# Patient Record
Sex: Female | Born: 1970 | Race: White | Hispanic: No | Smoking: Former smoker
Health system: Southern US, Community
[De-identification: ages and names within clinical notes are randomized; demographics above are authoritative.]

## PROBLEM LIST (undated history)

## (undated) DIAGNOSIS — O0900 Supervision of pregnancy with history of infertility, unspecified trimester: Secondary | ICD-10-CM

## (undated) DIAGNOSIS — E7800 Pure hypercholesterolemia, unspecified: Secondary | ICD-10-CM

## (undated) DIAGNOSIS — N979 Female infertility, unspecified: Secondary | ICD-10-CM

## (undated) DIAGNOSIS — R112 Nausea with vomiting, unspecified: Secondary | ICD-10-CM

## (undated) DIAGNOSIS — F329 Major depressive disorder, single episode, unspecified: Secondary | ICD-10-CM

## (undated) DIAGNOSIS — R51 Headache: Secondary | ICD-10-CM

## (undated) DIAGNOSIS — K219 Gastro-esophageal reflux disease without esophagitis: Secondary | ICD-10-CM

## (undated) DIAGNOSIS — E282 Polycystic ovarian syndrome: Secondary | ICD-10-CM

## (undated) DIAGNOSIS — N83209 Unspecified ovarian cyst, unspecified side: Secondary | ICD-10-CM

## (undated) DIAGNOSIS — Z9889 Other specified postprocedural states: Secondary | ICD-10-CM

## (undated) DIAGNOSIS — L719 Rosacea, unspecified: Secondary | ICD-10-CM

## (undated) DIAGNOSIS — F32A Depression, unspecified: Secondary | ICD-10-CM

## (undated) HISTORY — DX: Supervision of pregnancy with history of infertility, unspecified trimester: O09.00

## (undated) HISTORY — DX: Gastro-esophageal reflux disease without esophagitis: K21.9

## (undated) HISTORY — DX: Pure hypercholesterolemia, unspecified: E78.00

## (undated) HISTORY — DX: Depression, unspecified: F32.A

## (undated) HISTORY — PX: TUBAL LIGATION: SHX77

## (undated) HISTORY — DX: Rosacea, unspecified: L71.9

## (undated) HISTORY — PX: APPENDECTOMY: SHX54

## (undated) HISTORY — DX: Female infertility, unspecified: N97.9

## (undated) HISTORY — DX: Polycystic ovarian syndrome: E28.2

## (undated) HISTORY — DX: Major depressive disorder, single episode, unspecified: F32.9

## (undated) HISTORY — DX: Headache: R51

---

## 1998-04-04 ENCOUNTER — Other Ambulatory Visit: Admission: RE | Admit: 1998-04-04 | Discharge: 1998-04-04 | Payer: Self-pay | Admitting: Gynecology

## 2001-02-02 ENCOUNTER — Other Ambulatory Visit: Admission: RE | Admit: 2001-02-02 | Discharge: 2001-02-02 | Payer: Self-pay | Admitting: Obstetrics and Gynecology

## 2002-09-14 ENCOUNTER — Other Ambulatory Visit: Admission: RE | Admit: 2002-09-14 | Discharge: 2002-09-14 | Payer: Self-pay | Admitting: Obstetrics and Gynecology

## 2003-08-15 ENCOUNTER — Inpatient Hospital Stay (HOSPITAL_COMMUNITY): Admission: AD | Admit: 2003-08-15 | Discharge: 2003-08-15 | Payer: Self-pay | Admitting: Obstetrics and Gynecology

## 2003-09-12 ENCOUNTER — Inpatient Hospital Stay (HOSPITAL_COMMUNITY): Admission: AD | Admit: 2003-09-12 | Discharge: 2003-09-12 | Payer: Self-pay | Admitting: Obstetrics and Gynecology

## 2003-10-11 ENCOUNTER — Encounter: Admission: RE | Admit: 2003-10-11 | Discharge: 2004-01-09 | Payer: Self-pay | Admitting: Obstetrics and Gynecology

## 2003-11-18 ENCOUNTER — Ambulatory Visit (HOSPITAL_COMMUNITY): Admission: RE | Admit: 2003-11-18 | Discharge: 2003-11-18 | Payer: Self-pay | Admitting: Obstetrics and Gynecology

## 2003-12-20 ENCOUNTER — Ambulatory Visit (HOSPITAL_COMMUNITY): Admission: RE | Admit: 2003-12-20 | Discharge: 2003-12-20 | Payer: Self-pay | Admitting: Obstetrics and Gynecology

## 2004-03-20 ENCOUNTER — Inpatient Hospital Stay (HOSPITAL_COMMUNITY): Admission: AD | Admit: 2004-03-20 | Discharge: 2004-03-20 | Payer: Self-pay | Admitting: Obstetrics and Gynecology

## 2004-03-22 ENCOUNTER — Ambulatory Visit (HOSPITAL_COMMUNITY): Admission: RE | Admit: 2004-03-22 | Discharge: 2004-03-22 | Payer: Self-pay | Admitting: Obstetrics and Gynecology

## 2004-03-29 ENCOUNTER — Inpatient Hospital Stay (HOSPITAL_COMMUNITY): Admission: AD | Admit: 2004-03-29 | Discharge: 2004-03-29 | Payer: Self-pay | Admitting: Obstetrics and Gynecology

## 2004-04-02 ENCOUNTER — Inpatient Hospital Stay (HOSPITAL_COMMUNITY): Admission: AD | Admit: 2004-04-02 | Discharge: 2004-04-05 | Payer: Self-pay | Admitting: Obstetrics and Gynecology

## 2004-04-02 ENCOUNTER — Encounter (INDEPENDENT_AMBULATORY_CARE_PROVIDER_SITE_OTHER): Payer: Self-pay | Admitting: Specialist

## 2004-04-17 ENCOUNTER — Encounter: Admission: RE | Admit: 2004-04-17 | Discharge: 2004-05-17 | Payer: Self-pay | Admitting: Obstetrics and Gynecology

## 2004-05-18 ENCOUNTER — Encounter: Admission: RE | Admit: 2004-05-18 | Discharge: 2004-06-17 | Payer: Self-pay | Admitting: Obstetrics and Gynecology

## 2004-07-18 ENCOUNTER — Encounter: Admission: RE | Admit: 2004-07-18 | Discharge: 2004-08-17 | Payer: Self-pay | Admitting: Obstetrics and Gynecology

## 2004-08-18 ENCOUNTER — Encounter: Admission: RE | Admit: 2004-08-18 | Discharge: 2004-09-17 | Payer: Self-pay | Admitting: Obstetrics and Gynecology

## 2004-10-16 ENCOUNTER — Encounter: Admission: RE | Admit: 2004-10-16 | Discharge: 2004-11-15 | Payer: Self-pay | Admitting: Obstetrics and Gynecology

## 2004-12-16 ENCOUNTER — Encounter: Admission: RE | Admit: 2004-12-16 | Discharge: 2005-01-15 | Payer: Self-pay | Admitting: Obstetrics and Gynecology

## 2005-02-15 ENCOUNTER — Encounter: Admission: RE | Admit: 2005-02-15 | Discharge: 2005-03-17 | Payer: Self-pay | Admitting: Obstetrics and Gynecology

## 2005-03-18 ENCOUNTER — Encounter: Admission: RE | Admit: 2005-03-18 | Discharge: 2005-04-16 | Payer: Self-pay | Admitting: Obstetrics and Gynecology

## 2005-04-17 ENCOUNTER — Encounter: Admission: RE | Admit: 2005-04-17 | Discharge: 2005-05-17 | Payer: Self-pay | Admitting: Obstetrics and Gynecology

## 2005-05-18 ENCOUNTER — Encounter: Admission: RE | Admit: 2005-05-18 | Discharge: 2005-06-16 | Payer: Self-pay | Admitting: Obstetrics and Gynecology

## 2005-06-12 ENCOUNTER — Ambulatory Visit (HOSPITAL_COMMUNITY): Admission: RE | Admit: 2005-06-12 | Discharge: 2005-06-12 | Payer: Self-pay | Admitting: Obstetrics & Gynecology

## 2005-06-12 ENCOUNTER — Ambulatory Visit: Payer: Self-pay | Admitting: *Deleted

## 2005-06-17 ENCOUNTER — Encounter: Admission: RE | Admit: 2005-06-17 | Discharge: 2005-06-28 | Payer: Self-pay | Admitting: Obstetrics and Gynecology

## 2005-06-19 ENCOUNTER — Ambulatory Visit: Payer: Self-pay | Admitting: *Deleted

## 2005-06-26 ENCOUNTER — Ambulatory Visit: Payer: Self-pay | Admitting: Obstetrics & Gynecology

## 2005-07-03 ENCOUNTER — Ambulatory Visit: Payer: Self-pay | Admitting: Obstetrics & Gynecology

## 2005-07-10 ENCOUNTER — Ambulatory Visit: Payer: Self-pay | Admitting: *Deleted

## 2005-07-16 ENCOUNTER — Ambulatory Visit: Payer: Self-pay | Admitting: *Deleted

## 2005-07-17 ENCOUNTER — Ambulatory Visit: Payer: Self-pay | Admitting: Family Medicine

## 2005-07-24 ENCOUNTER — Ambulatory Visit: Payer: Self-pay | Admitting: Family Medicine

## 2005-07-31 ENCOUNTER — Ambulatory Visit: Payer: Self-pay | Admitting: *Deleted

## 2005-08-07 ENCOUNTER — Ambulatory Visit: Payer: Self-pay | Admitting: *Deleted

## 2005-08-21 ENCOUNTER — Ambulatory Visit: Payer: Self-pay | Admitting: *Deleted

## 2005-09-02 ENCOUNTER — Ambulatory Visit: Payer: Self-pay | Admitting: *Deleted

## 2005-09-03 ENCOUNTER — Ambulatory Visit (HOSPITAL_COMMUNITY): Admission: RE | Admit: 2005-09-03 | Discharge: 2005-09-03 | Payer: Self-pay | Admitting: *Deleted

## 2005-09-09 ENCOUNTER — Inpatient Hospital Stay (HOSPITAL_COMMUNITY): Admission: AD | Admit: 2005-09-09 | Discharge: 2005-09-09 | Payer: Self-pay | Admitting: *Deleted

## 2005-09-09 ENCOUNTER — Ambulatory Visit: Payer: Self-pay | Admitting: Family Medicine

## 2005-09-16 ENCOUNTER — Ambulatory Visit: Payer: Self-pay | Admitting: Obstetrics & Gynecology

## 2005-09-30 ENCOUNTER — Ambulatory Visit: Payer: Self-pay | Admitting: Family Medicine

## 2005-10-14 ENCOUNTER — Ambulatory Visit: Payer: Self-pay | Admitting: Obstetrics & Gynecology

## 2005-10-21 ENCOUNTER — Ambulatory Visit: Payer: Self-pay | Admitting: Family Medicine

## 2005-11-04 ENCOUNTER — Ambulatory Visit: Payer: Self-pay | Admitting: Family Medicine

## 2005-11-18 ENCOUNTER — Ambulatory Visit: Payer: Self-pay | Admitting: Family Medicine

## 2005-11-18 ENCOUNTER — Ambulatory Visit (HOSPITAL_COMMUNITY): Admission: RE | Admit: 2005-11-18 | Discharge: 2005-11-18 | Payer: Self-pay | Admitting: *Deleted

## 2005-12-02 ENCOUNTER — Ambulatory Visit: Payer: Self-pay | Admitting: Obstetrics & Gynecology

## 2005-12-02 ENCOUNTER — Inpatient Hospital Stay (HOSPITAL_COMMUNITY): Admission: AD | Admit: 2005-12-02 | Discharge: 2005-12-02 | Payer: Self-pay | Admitting: Gynecology

## 2005-12-10 ENCOUNTER — Inpatient Hospital Stay (HOSPITAL_COMMUNITY): Admission: AD | Admit: 2005-12-10 | Discharge: 2005-12-10 | Payer: Self-pay | Admitting: Obstetrics and Gynecology

## 2005-12-10 ENCOUNTER — Ambulatory Visit: Payer: Self-pay | Admitting: Certified Nurse Midwife

## 2005-12-16 ENCOUNTER — Ambulatory Visit: Payer: Self-pay | Admitting: Obstetrics & Gynecology

## 2005-12-23 ENCOUNTER — Ambulatory Visit: Payer: Self-pay | Admitting: Gynecology

## 2005-12-30 ENCOUNTER — Ambulatory Visit: Payer: Self-pay | Admitting: Obstetrics & Gynecology

## 2005-12-30 ENCOUNTER — Ambulatory Visit (HOSPITAL_COMMUNITY): Admission: RE | Admit: 2005-12-30 | Discharge: 2005-12-30 | Payer: Self-pay | Admitting: Obstetrics and Gynecology

## 2006-01-02 ENCOUNTER — Ambulatory Visit: Payer: Self-pay | Admitting: Family Medicine

## 2006-01-06 ENCOUNTER — Ambulatory Visit: Payer: Self-pay | Admitting: Obstetrics & Gynecology

## 2006-01-09 ENCOUNTER — Ambulatory Visit: Payer: Self-pay | Admitting: Family Medicine

## 2006-01-13 ENCOUNTER — Ambulatory Visit: Payer: Self-pay | Admitting: Gynecology

## 2006-01-15 ENCOUNTER — Inpatient Hospital Stay (HOSPITAL_COMMUNITY): Admission: AD | Admit: 2006-01-15 | Discharge: 2006-01-18 | Payer: Self-pay | Admitting: Gynecology

## 2006-01-15 ENCOUNTER — Encounter (INDEPENDENT_AMBULATORY_CARE_PROVIDER_SITE_OTHER): Payer: Self-pay | Admitting: *Deleted

## 2006-01-15 ENCOUNTER — Ambulatory Visit: Payer: Self-pay | Admitting: Family Medicine

## 2006-01-20 ENCOUNTER — Ambulatory Visit: Payer: Self-pay | Admitting: Gynecology

## 2006-07-18 ENCOUNTER — Emergency Department (HOSPITAL_COMMUNITY): Admission: EM | Admit: 2006-07-18 | Discharge: 2006-07-18 | Payer: Self-pay | Admitting: Emergency Medicine

## 2008-09-29 LAB — HM DIABETES EYE EXAM: HM Diabetic Eye Exam: NORMAL

## 2009-03-08 ENCOUNTER — Ambulatory Visit: Payer: Self-pay | Admitting: Family Medicine

## 2009-03-08 DIAGNOSIS — E119 Type 2 diabetes mellitus without complications: Secondary | ICD-10-CM

## 2009-03-08 DIAGNOSIS — R51 Headache: Secondary | ICD-10-CM

## 2009-03-08 DIAGNOSIS — R519 Headache, unspecified: Secondary | ICD-10-CM | POA: Insufficient documentation

## 2009-03-08 DIAGNOSIS — Z9189 Other specified personal risk factors, not elsewhere classified: Secondary | ICD-10-CM | POA: Insufficient documentation

## 2009-03-08 DIAGNOSIS — K219 Gastro-esophageal reflux disease without esophagitis: Secondary | ICD-10-CM | POA: Insufficient documentation

## 2009-03-08 DIAGNOSIS — E1165 Type 2 diabetes mellitus with hyperglycemia: Secondary | ICD-10-CM | POA: Insufficient documentation

## 2009-03-08 LAB — CONVERTED CEMR LAB
BUN: 10 mg/dL (ref 6–23)
Basophils Absolute: 0 10*3/uL (ref 0.0–0.1)
Basophils Relative: 0.4 % (ref 0.0–3.0)
CO2: 27 meq/L (ref 19–32)
Calcium: 8.8 mg/dL (ref 8.4–10.5)
Chloride: 102 meq/L (ref 96–112)
Creatinine, Ser: 0.6 mg/dL (ref 0.4–1.2)
Creatinine,U: 75.3 mg/dL
Direct LDL: 90.7 mg/dL
Eosinophils Absolute: 0.3 10*3/uL (ref 0.0–0.7)
Eosinophils Relative: 2.8 % (ref 0.0–5.0)
GFR calc non Af Amer: 118.26 mL/min (ref >60)
Glucose, Bld: 343 mg/dL — ABNORMAL HIGH (ref 70–99)
HCT: 40.7 % (ref 36.0–46.0)
Hemoglobin: 13.6 g/dL (ref 12.0–15.0)
Hgb A1c MFr Bld: 8.3 % — ABNORMAL HIGH (ref 4.6–6.5)
LDL Cholesterol: 90 mg/dL
Lymphocytes Relative: 25.1 % (ref 12.0–46.0)
Lymphs Abs: 2.3 10*3/uL (ref 0.7–4.0)
MCHC: 33.4 g/dL (ref 30.0–36.0)
MCV: 86.1 fL (ref 78.0–100.0)
Microalb Creat Ratio: 38.5 mg/g — ABNORMAL HIGH (ref 0.0–30.0)
Microalb, Ur: 2.9 mg/dL — ABNORMAL HIGH (ref 0.0–1.9)
Monocytes Absolute: 0.4 10*3/uL (ref 0.1–1.0)
Monocytes Relative: 4.6 % (ref 3.0–12.0)
Neutro Abs: 6.3 10*3/uL (ref 1.4–7.7)
Neutrophils Relative %: 67.1 % (ref 43.0–77.0)
Platelets: 230 10*3/uL (ref 150.0–400.0)
Potassium: 4 meq/L (ref 3.5–5.1)
RBC: 4.72 M/uL (ref 3.87–5.11)
RDW: 14.2 % (ref 11.5–14.6)
Sodium: 136 meq/L (ref 135–145)
WBC: 9.3 10*3/uL (ref 4.5–10.5)

## 2009-03-09 ENCOUNTER — Telehealth: Payer: Self-pay | Admitting: Family Medicine

## 2009-03-28 ENCOUNTER — Encounter: Admission: RE | Admit: 2009-03-28 | Discharge: 2009-06-26 | Payer: Self-pay | Admitting: Family Medicine

## 2009-04-11 ENCOUNTER — Other Ambulatory Visit: Admission: RE | Admit: 2009-04-11 | Discharge: 2009-04-11 | Payer: Self-pay | Admitting: Family Medicine

## 2009-04-11 ENCOUNTER — Encounter: Payer: Self-pay | Admitting: Family Medicine

## 2009-04-11 ENCOUNTER — Ambulatory Visit: Payer: Self-pay | Admitting: Family Medicine

## 2009-04-11 DIAGNOSIS — E1169 Type 2 diabetes mellitus with other specified complication: Secondary | ICD-10-CM | POA: Insufficient documentation

## 2009-04-11 DIAGNOSIS — E785 Hyperlipidemia, unspecified: Secondary | ICD-10-CM

## 2009-04-11 DIAGNOSIS — L719 Rosacea, unspecified: Secondary | ICD-10-CM | POA: Insufficient documentation

## 2009-04-11 LAB — CONVERTED CEMR LAB
Cholesterol, target level: 200 mg/dL
HDL goal, serum: 50 mg/dL
LDL Goal: 70 mg/dL

## 2009-04-11 LAB — HM DIABETES FOOT EXAM

## 2009-04-13 ENCOUNTER — Encounter (INDEPENDENT_AMBULATORY_CARE_PROVIDER_SITE_OTHER): Payer: Self-pay | Admitting: *Deleted

## 2009-04-18 ENCOUNTER — Encounter: Admission: RE | Admit: 2009-04-18 | Discharge: 2009-04-18 | Payer: Self-pay | Admitting: Family Medicine

## 2009-04-19 ENCOUNTER — Encounter (INDEPENDENT_AMBULATORY_CARE_PROVIDER_SITE_OTHER): Payer: Self-pay | Admitting: *Deleted

## 2009-05-08 ENCOUNTER — Encounter: Payer: Self-pay | Admitting: Family Medicine

## 2009-05-16 ENCOUNTER — Ambulatory Visit: Payer: Self-pay | Admitting: Family Medicine

## 2009-05-31 ENCOUNTER — Ambulatory Visit: Payer: Self-pay | Admitting: Family Medicine

## 2009-07-27 ENCOUNTER — Telehealth: Payer: Self-pay | Admitting: Family Medicine

## 2009-07-27 ENCOUNTER — Ambulatory Visit: Payer: Self-pay | Admitting: Family Medicine

## 2009-07-27 LAB — CONVERTED CEMR LAB: Blood Glucose, Fingerstick: 186

## 2009-09-28 ENCOUNTER — Ambulatory Visit: Payer: Self-pay | Admitting: Family Medicine

## 2009-10-03 ENCOUNTER — Telehealth: Payer: Self-pay | Admitting: Family Medicine

## 2009-10-18 ENCOUNTER — Encounter: Payer: Self-pay | Admitting: Family Medicine

## 2009-11-16 ENCOUNTER — Ambulatory Visit: Payer: Self-pay | Admitting: Family Medicine

## 2009-11-16 LAB — CONVERTED CEMR LAB
BUN: 11 mg/dL (ref 6–23)
CO2: 24 meq/L (ref 19–32)
CRP, High Sensitivity: 7.18 — ABNORMAL HIGH (ref 0.00–5.00)
Calcium: 8.8 mg/dL (ref 8.4–10.5)
Chloride: 103 meq/L (ref 96–112)
Creatinine, Ser: 0.5 mg/dL (ref 0.4–1.2)
Creatinine,U: 156.4 mg/dL
GFR calc non Af Amer: 152.45 mL/min (ref >60)
Glucose, Bld: 206 mg/dL — ABNORMAL HIGH (ref 70–99)
Hgb A1c MFr Bld: 7.3 % — ABNORMAL HIGH (ref 4.6–6.5)
Microalb Creat Ratio: 3.1 mg/g (ref 0.0–30.0)
Microalb, Ur: 4.8 mg/dL — ABNORMAL HIGH (ref 0.0–1.9)
Potassium: 4.1 meq/L (ref 3.5–5.1)
Rheumatoid fact SerPl-aCnc: >20 intl units/mL (ref 0.0–20.0)
Sed Rate: 10 mm/hr (ref 0–22)
Sodium: 136 meq/L (ref 135–145)

## 2009-11-20 ENCOUNTER — Ambulatory Visit: Payer: Self-pay | Admitting: Family Medicine

## 2009-11-20 LAB — CONVERTED CEMR LAB
ANA Titer 1: 1:40 {titer} — ABNORMAL HIGH
Anti Nuclear Antibody(ANA): POSITIVE — AB

## 2009-11-23 LAB — CONVERTED CEMR LAB
ENA SM Ab Ser-aCnc: <1 (ref <30)
Scleroderma (Scl-70) (ENA) Antibody, IgG: 1 (ref <30)
ds DNA Ab: 4 (ref <30)

## 2009-11-24 ENCOUNTER — Ambulatory Visit: Payer: Self-pay | Admitting: Family Medicine

## 2009-11-24 LAB — CONVERTED CEMR LAB: Rapid Strep: POSITIVE

## 2009-11-29 ENCOUNTER — Telehealth: Payer: Self-pay | Admitting: Family Medicine

## 2009-12-06 ENCOUNTER — Encounter: Payer: Self-pay | Admitting: Family Medicine

## 2009-12-12 ENCOUNTER — Ambulatory Visit: Payer: Self-pay | Admitting: Family Medicine

## 2010-01-29 ENCOUNTER — Telehealth: Payer: Self-pay | Admitting: Family Medicine

## 2010-02-27 ENCOUNTER — Encounter: Payer: Self-pay | Admitting: Family Medicine

## 2010-04-06 ENCOUNTER — Encounter: Admission: RE | Admit: 2010-04-06 | Discharge: 2010-04-06 | Payer: Self-pay | Admitting: Rheumatology

## 2010-04-06 ENCOUNTER — Encounter: Payer: Self-pay | Admitting: Family Medicine

## 2010-05-04 ENCOUNTER — Encounter: Admission: RE | Admit: 2010-05-04 | Discharge: 2010-05-04 | Payer: Self-pay | Admitting: Family Medicine

## 2010-06-12 ENCOUNTER — Encounter: Payer: Self-pay | Admitting: Family Medicine

## 2010-06-27 ENCOUNTER — Telehealth (INDEPENDENT_AMBULATORY_CARE_PROVIDER_SITE_OTHER): Payer: Self-pay | Admitting: *Deleted

## 2010-06-29 ENCOUNTER — Ambulatory Visit
Admission: RE | Admit: 2010-06-29 | Discharge: 2010-06-29 | Payer: Self-pay | Source: Home / Self Care | Attending: Family Medicine | Admitting: Family Medicine

## 2010-06-29 LAB — CONVERTED CEMR LAB: Rapid Strep: NEGATIVE

## 2010-07-22 ENCOUNTER — Encounter: Payer: Self-pay | Admitting: *Deleted

## 2010-08-02 NOTE — Assessment & Plan Note (Signed)
Summary: SORE THROAT/lb   Vital Signs:  Patient profile:   40 year old female Height:      69 inches Weight:      198.50 pounds BMI:     29.42 Temp:     98.8 degrees F oral Pulse rate:   72 / minute Pulse rhythm:   regular BP sitting:   90 / 60  (left arm) Cuff size:   large  Vitals Entered By: Benny Lennert CMA Duncan Dull) (June 29, 2010 11:55 AM)  History of Present Illness: Chief complaint sore throat  Acute Visit History:      The patient complains of earache and fever.  She denies chest pain, cough, headache, nasal discharge, and nausea.  Other comments include: right neck sore to touch drainage fro ear  post nasal drip , but no congestion.  2 daughters with strep in last week.  using tylenol for sore throat. [name]        Her highest temperature has been low grade.        The earache is located on the right side.  There is no history of recent antibiotic usage associated with the earache.        Problems Prior to Update: 1)  Eustachian Tube Dysfunction, Right  (ICD-381.81) 2)  Acute Pharyngitis  (ICD-462) 3)  Arthralgia  (ICD-719.40) 4)  Hyperlipidemia  (ICD-272.4) 5)  Acne Rosacea  (ICD-695.3) 6)  Health Maintenance Exam  (ICD-V70.0) 7)  Fatigue  (ICD-780.79) 8)  Diabetes Mellitus, Type II  (ICD-250.00) 9)  Family History Breast Cancer 1st Degree Relative <50  (ICD-V16.3) 10)  Headache, Chronic  (ICD-784.0) 11)  Gerd  (ICD-530.81) 12)  Chickenpox, Hx of  (ICD-V15.9)  Current Medications (verified): 1)  Blood Glucose Test Strips, 250.00 .... Check Blood Sugar Two Times A Day 2)  Blood Sugar Lancets, 250.00 .... Check Blood Sugar Two Times A Day 3)  Accucheck Aviva Test Strips .[name] KitchenMarland Kitchen. 250.00 Check Bs Two Times A Day 4)  Accucheck Aviva Lancets .[name] KitchenMarland Kitchen. 250.00 Check Bs Two Times A Day 5)  Meloxicam 15 Mg Tabs (Meloxicam) .[name].. 1 By Mouth Daily 6)  Voltaren 1 %  Gel (Diclofenac Sodium) .... Qid To Hands (3 Grams) As Needed 7)  Fluticasone Propionate 50 Mcg/act Susp  (Fluticasone Propionate) .... 2 Sprays Per Nostril Daily 8)  Metformin Hcl 1000 Mg Tabs (Metformin Hcl) .... Take One Tablet By Mouth 2 Times Daily 9)  Glipizide 5 Mg Tabs (Glipizide) .... Take One Tablet By Mouth 2 Times Daily  Allergies (verified): No Known Drug Allergies  Past History:  Past medical, surgical, family and social histories (including risk factors) reviewed, and no changes noted (except as noted below).  Past Medical History: Reviewed history from 04/11/2009 and no changes required. UTI'S, HX OF (ICD-V13.00) HEADACHE, CHRONIC (ICD-784.0) GERD (ICD-530.81) CHICKENPOX, HX OF (ICD-V15.9) Diabetes mellitus, type II Hyperlipidemia Rosacea  Past Surgical History: Reviewed history from 03/08/2009 and no changes required. 06-30-1996 C-section of triplets 04-02-04 and 01-15-2006 c-sections singles Appendix 2006 BTL  Family History: Reviewed history from 04/11/2009 and no changes required. Family History of Arthritis Family History Breast cancer 1st degree relative <50 Family History Diabetes 1st degree relative Family History High cholesterol Family History Hypertension Family History of Stroke F 1st degree relative <60 Family History of Stroke M 1st degree relative <50  Mother: Breast CA  Social History: Reviewed history from 03/08/2009 and no changes required. Occupation:Music Minister Married Alcohol use-no Drug use-no Regular exercise-no  Review of Systems General:  Complains  of fatigue; denies fever. CV:  Denies chest pain or discomfort. Resp:  Denies shortness of breath.  Physical Exam  General:  Well-developed,well-nourished,in no acute distress; alert,appropriate and cooperative throughout examination Eyes:  No corneal or conjunctival inflammation noted. EOMI. Perrla. Funduscopic exam benign, without hemorrhages, exudates or papilledema. Vision grossly normal. Ears:  clear fluid right Tm Nose:  nasal discharge, no mucosal pallor.   Mouth:  no  exudates and pharyngeal erythema.   Neck:  no carotid bruit or thyromegaly no cervical or supraclavicular lymphadenopathy  Lungs:  Normal respiratory effort, chest expands symmetrically. Lungs are clear to auscultation, no crackles or wheezes. Heart:  Normal rate and regular rhythm. S1 and S2 normal without gallop, murmur, click, rub or other extra sounds.   Impression & Recommendations:  Problem # 1:  URI (ICD-465.9)  Viral pharyngitis.  Her updated medication list for this problem includes:    Meloxicam 15 Mg Tabs (Meloxicam) .[name]... 1 by mouth daily  Instructed on symptomatic treatment. Call if symptoms persist or worsen.   Complete Medication List: 1)  Blood Glucose Test Strips, 250.00  .... Check blood sugar two times a day 2)  Blood Sugar Lancets, 250.00  .... Check blood sugar two times a day 3)  Accucheck Aviva Test Strips  .[name] KitchenMarland Kitchen. 250.00 check bs two times a day 4)  Accucheck Aviva Lancets  .[name] KitchenMarland Kitchen. 250.00 check bs two times a day 5)  Meloxicam 15 Mg Tabs (Meloxicam) .[name].. 1 by mouth daily 6)  Voltaren 1 % Gel (Diclofenac sodium) .... Qid to hands (3 grams) as needed 7)  Fluticasone Propionate 50 Mcg/act Susp (Fluticasone propionate) .... 2 sprays per nostril daily 8)  Metformin Hcl 1000 Mg Tabs (Metformin hcl) .... Take one tablet by mouth 2 times daily 9)  Glipizide 5 Mg Tabs (Glipizide) .... Take one tablet by mouth 2 times daily  Other Orders: Rapid Strep (62130)   Orders Added: 1)  Rapid Strep [86578] 2)  Est. Patient Level II [46962]    Current Allergies (reviewed today): No known allergies   Laboratory Results    Other Tests  Rapid Strep: negative  Kit Test Internal QC: Negative   (Normal Range: Negative)

## 2010-08-02 NOTE — Progress Notes (Signed)
Summary: can't afford Janumet  Phone Note Call from Patient Call back at Home Phone [phone]   Caller: Patient Call For: [name] MD Summary of Call: Patient states that she can not afford the Janumet. She called asking for coupons, but we didn't have any. She is asking if she could try something different that is less expensive. [facility].  Initial call taken by: Melody Comas,  June 27, 2010 4:19 PM  Follow-up for Phone Call        d/c Janumet  let her know  Call in Metformin 1000 mg by mouth two times a day, #60, 2 refills  Glipizide 5 mg, 1 by mouth two times a day, #60, 2 refills  f/u with me in 1 month  call in and update med list. Follow-up by: [name] MD,  June 28, 2010 3:49 PM  Additional Follow-up for Phone Call Additional follow up Details #1::        Patient advised via message on machine about medication called in and asked to call and schedule a 1 month follow up  Additional Follow-up by: Benny Lennert CMA Duncan Dull),  June 29, 2010 8:17 AM    New/Updated Medications: METFORMIN HCL 1000 MG TABS (METFORMIN HCL) take one tablet by mouth 2 times daily GLIPIZIDE 5 MG TABS (GLIPIZIDE) take one tablet by mouth 2 times daily Prescriptions: GLIPIZIDE 5 MG TABS (GLIPIZIDE) take one tablet by mouth 2 times daily  #60 x 2   Entered by:   Benny Lennert CMA (AAMA)   Authorized by:   [name] MD   Signed by:   Benny Lennert CMA (AAMA) on 06/29/2010   Method used:   [facility]        [facility] [id number]retail[facility]       [address]       [place], [place]  [address]       Ph: [phone] or [phone]       Fax: [phone]   RxID:   [id number] METFORMIN HCL 1000 MG TABS (METFORMIN HCL) take one tablet by mouth 2 times daily  #60 x 2   Entered by:   Benny Lennert CMA (AAMA)   Authorized by:   [name] MD   Signed by:   Benny Lennert CMA (AAMA) on 06/29/2010   Method used:    [facility]        [id number]retail)       [address]       [place], [place]  [address]       Ph: [phone] or [phone]       Fax: [phone]   RxID:   [id number]   Prior Medications: BLOOD GLUCOSE TEST STRIPS, 250.00 () Check blood sugar two times a day BLOOD SUGAR LANCETS, 250.00 () check blood sugar two times a day ACCUCHECK AVIVA TEST STRIPS () 250.00 check bs two times a day ACCUCHECK AVIVA LANCETS () 250.00 Check bs two times a day MELOXICAM 15 MG TABS (MELOXICAM) 1 by mouth daily VOLTAREN 1 %  GEL (DICLOFENAC SODIUM) qid to hands (3 grams) as needed FLUTICASONE PROPIONATE 50 MCG/ACT SUSP (FLUTICASONE PROPIONATE) 2 sprays per nostril daily METFORMIN HCL 1000 MG TABS (METFORMIN HCL) take one tablet by mouth 2 times daily GLIPIZIDE 5 MG TABS (GLIPIZIDE) take one tablet by mouth 2 times daily Current Allergies: No known allergies

## 2010-08-02 NOTE — Letter (Signed)
Summary: Lindsey Mason OD  Lindsey Mason OD   Imported By: Lanelle Bal 12/13/2009 09:47:41  _____________________________________________________________________  External Attachment:    Type:   Image     Comment:   External Document

## 2010-08-02 NOTE — Assessment & Plan Note (Signed)
Summary: ? STREP AND EAR ACHE  CYD   Vital Signs:  Patient profile:   40 year old female Height:      69 inches Weight:      204 pounds BMI:     30.23 Temp:     99.0 degrees F oral Pulse rate:   72 / minute Pulse rhythm:   regular BP sitting:   112 / 80  (right arm) Cuff size:   large  Vitals Entered By: Linde Gillis CMA Duncan Dull) (Nov 24, 2009 12:30 PM) CC: ? strep throat, ear ache   History of Present Illness: 40 yo here ? strep throat.  Daughter diagnosed with strep throat earlier this week.  Last two days, very sore throat. Left ear hurts, has a swollen node behind her left ear, tender to touch. Headache, subjective fever.  No cough, runny nose, fever or chills.  Current Medications (verified): 1)  Blood Glucose Test Strips, 250.00 .... Check Blood Sugar Two Times A Day 2)  Blood Sugar Lancets, 250.00 .... Check Blood Sugar Two Times A Day 3)  Janumet 50-1000 Mg Tabs (Sitagliptin-Metformin Hcl) .[name].. 1 By Mouth Two Times A Day 4)  Accucheck Aviva Test Strips .[name] KitchenMarland Kitchen. 250.00 Check Bs Two Times A Day 5)  Accucheck Aviva Lancets .[name] KitchenMarland Kitchen. 250.00 Check Bs Two Times A Day 6)  Meloxicam 15 Mg Tabs (Meloxicam) .[name].. 1 By Mouth Daily 7)  Voltaren 1 %  Gel (Diclofenac Sodium) .... Qid To Hands (3 Grams) As Needed 8)  Penicillin V Potassium 500 Mg Tabs (Penicillin V Potassium) .[name].. 1 Tab By Mouth Three Times A Day X 10 Days  Allergies (verified): No Known Drug Allergies  Review of Systems      See HPI General:  Complains of chills and fever. ENT:  Complains of sore throat; denies hoarseness, nasal congestion, and postnasal drainage. Resp:  Denies cough, shortness of breath, sputum productive, and wheezing.  Physical Exam  General:  Well-developed,well-nourished,in no acute distress; alert,appropriate and cooperative throughout examination Mouth:  pharyngeal erythema and pharyngeal exudate.   Lungs:  Normal respiratory effort, chest expands symmetrically. Lungs are clear to  auscultation, no crackles or wheezes. Heart:  Normal rate and regular rhythm. S1 and S2 normal without gallop, murmur, click, rub or other extra sounds. Cervical Nodes:  L anterior LN tender and L posterior LN tender.   Psych:  Cognition and judgment appear intact. Alert and cooperative with normal attention span and concentration. No apparent delusions, illusions, hallucinations   Impression & Recommendations:  Problem # 1:  ACUTE PHARYNGITIS (ICD-462) Assessment New Rapid strep positive with cardinal strep symptoms. Treat with PCN 500 mg three times a day x 10 days. Ibuprofen as needed fever and comfort. Her updated medication list for this problem includes:    Meloxicam 15 Mg Tabs (Meloxicam) .[name]... 1 by mouth daily    Penicillin V Potassium 500 Mg Tabs (Penicillin v potassium) .[name]... 1 tab by mouth three times a day x 10 days  Orders: Rapid Strep (47425)  Complete Medication List: 1)  Blood Glucose Test Strips, 250.00  .... Check blood sugar two times a day 2)  Blood Sugar Lancets, 250.00  .... Check blood sugar two times a day 3)  Janumet 50-1000 Mg Tabs (Sitagliptin-metformin hcl) .[name].. 1 by mouth two times a day 4)  Accucheck Aviva Test Strips  .[name] KitchenMarland Kitchen. 250.00 check bs two times a day 5)  Accucheck Aviva Lancets  .[name] KitchenMarland Kitchen. 250.00 check bs two times a day 6)  Meloxicam 15 Mg  Tabs (Meloxicam) .[name].. 1 by mouth daily 7)  Voltaren 1 % Gel (Diclofenac sodium) .... Qid to hands (3 grams) as needed 8)  Penicillin V Potassium 500 Mg Tabs (Penicillin v potassium) .[name].. 1 tab by mouth three times a day x 10 days Prescriptions: PENICILLIN V POTASSIUM 500 MG TABS (PENICILLIN V POTASSIUM) 1 tab by mouth three times a day x 10 days  #30 x 0   Entered and Authorized by:   Ruthe Mannan MD   Signed by:   Ruthe Mannan MD on 11/24/2009   Method used:   [facility]        [facility] [id number]       [address]       [place], [place]  [address]       Ph: [phone] or [phone]        Fax: [phone]   RxID:   [id number]   Current Allergies (reviewed today): No known allergies   Laboratory Results    Other Tests  Rapid Strep: positive  Kit Test Internal QC: Positive   (Normal Range: Negative)

## 2010-08-02 NOTE — Progress Notes (Signed)
Summary: pt has yeast infection  Phone Note Call from Patient Call back at Home Phone [phone]   Caller: Patient Call For: [name] MD Summary of Call: Pt has taken 5 days of pcn and still has 5 to go for ear infections and strep.  She has developed a yeast infection- itching and burning- and is taking monostat.  She is asking if she needs to continue the antibiotc, will the yeast get worse if she does, should she finish the abx and then get something else for yeast?  Please advise. Initial call taken by: Lowella Petties CMA,  November 29, 2009 12:43 PM  Follow-up for Phone Call        Call in   Diflucan 150 mg, 1 by mouth x 1, may repeat if needed in 1 week. #2  Cont ABX for strep -- or it will come back Follow-up by: [name] MD,  November 29, 2009 1:46 PM  Additional Follow-up for Phone Call Additional follow up Details #1::        Patient also has a very bad fever blister on her lip and want to know what she can do for that Additional Follow-up by: Benny Lennert CMA Duncan Dull),  November 29, 2009 2:50 PM    Additional Follow-up for Phone Call Additional follow up Details #2::    Cold sores - do not have to do anything. Benign viral infection.  If it really hurts and wants to be maximally aggressive, could use some oral antivirals.   You do not have do that - it will resolve.  Follow-up by: [name] MD,  November 29, 2009 4:47 PM  Additional Follow-up for Phone Call Additional follow up Details #3:: Details for Additional Follow-up Action Taken: patient advised.Consuello Masse CMA  Additional Follow-up by: Benny Lennert CMA Duncan Dull),  November 30, 2009 7:56 AM  New/Updated Medications: DIFLUCAN 150 MG TABS (FLUCONAZOLE) take on by mouth today and may repeat in 1 week if needed Prescriptions: DIFLUCAN 150 MG TABS (FLUCONAZOLE) take on by mouth today and may repeat in 1 week if needed  #2 x 1   Entered by:   Benny Lennert CMA (AAMA)   Authorized by:   [name] MD   Signed by:   Benny Lennert CMA (AAMA) on 11/29/2009   Method used:   [facility]        [facility] ([facility]ail)       [address]       [place], [place]  [address]       Ph: [phone] or [phone]       Fax: [phone]   RxID:   [id number]

## 2010-08-02 NOTE — Progress Notes (Signed)
Summary: Passed out this morning  Phone Note Call from Patient Call back at Home Phone [phone]   Caller: Patient Call For: [name] MD Summary of Call: Patient called stating that she had stomach cramping, diarrhea,and nausea this morning.  Patient says she passed out after that, she felt like she had to vomit but was unable to.  Recovered from passing out, took a nap, woke up and now her vision is slightly blurred.  She compares the feeling to being outside in the sunlight, then coming inside and your eyes have to adjust to the lighting.  Feels like eyes are just not adjusting.  She says her husband was home with her, did not witness her passing out but patient says she did not hit her head on anything when she passed out.  Patient says she is not having any other symptoms.  Took Ibuprofen which has helped a little.  Please advise. Initial call taken by: Linde Gillis CMA Duncan Dull),  July 27, 2009 2:01 PM  Follow-up for Phone Call        She needs to be seen.   I d/w Dr. Dayton Martes and she can see her at 3:30 Follow-up by: [name] MD,  July 27, 2009 2:12 PM  Additional Follow-up for Phone Call Additional follow up Details #1::        Patient advised, scheduled for 3:30 appt this afternoon with Dr. Dayton Martes. Additional Follow-up by: Linde Gillis CMA Duncan Dull),  July 27, 2009 2:26 PM

## 2010-08-02 NOTE — Assessment & Plan Note (Signed)
Summary: 10:15  EAR/CLE   Vital Signs:  Patient profile:   40 year old female Height:      69 inches Weight:      205.0 pounds BMI:     30.38 Temp:     98.1 degrees F oral Pulse rate:   72 / minute Pulse rhythm:   regular BP sitting:   120 / 80  (left arm) Cuff size:   large  Vitals Entered By: Benny Lennert CMA Duncan Dull) (December 12, 2009 10:19 AM)  History of Present Illness: Chief complaint Ears  Seen on 5/27 for ST...dx with strep pharyngitis...treated with 10 day course of penicillin. Resolved symptoms completely until 3 days ago Since that time noticed clicking sounds in right ear. No ear pain. Swelling and ache in right neck. No fever. No sore throat. No dysphagia or odonyphagia.  no OTC meds.    Problems Prior to Update: 1)  Acute Pharyngitis  (ICD-462) 2)  Arthralgia  (ICD-719.40) 3)  Hyperlipidemia  (ICD-272.4) 4)  Acne Rosacea  (ICD-695.3) 5)  Health Maintenance Exam  (ICD-V70.0) 6)  Fatigue  (ICD-780.79) 7)  Diabetes Mellitus, Type II  (ICD-250.00) 8)  Family History Breast Cancer 1st Degree Relative <50  (ICD-V16.3) 9)  Headache, Chronic  (ICD-784.0) 10)  Gerd  (ICD-530.81) 11)  Chickenpox, Hx of  (ICD-V15.9)  Current Medications (verified): 1)  Blood Glucose Test Strips, 250.00 .... Check Blood Sugar Two Times A Day 2)  Blood Sugar Lancets, 250.00 .... Check Blood Sugar Two Times A Day 3)  Janumet 50-1000 Mg Tabs (Sitagliptin-Metformin Hcl) .[name].. 1 By Mouth Two Times A Day 4)  Accucheck Aviva Test Strips .[name] KitchenMarland Kitchen. 250.00 Check Bs Two Times A Day 5)  Accucheck Aviva Lancets .[name] KitchenMarland Kitchen. 250.00 Check Bs Two Times A Day 6)  Meloxicam 15 Mg Tabs (Meloxicam) .[name].. 1 By Mouth Daily 7)  Voltaren 1 %  Gel (Diclofenac Sodium) .... Qid To Hands (3 Grams) As Needed 8)  Fluticasone Propionate 50 Mcg/act Susp (Fluticasone Propionate) .... 2 Sprays Per Nostril Daily  Allergies (verified): No Known Drug Allergies  Past History:  Past medical, surgical, family and social  histories (including risk factors) reviewed, and no changes noted (except as noted below).  Past Medical History: Reviewed history from 04/11/2009 and no changes required. UTI'S, HX OF (ICD-V13.00) HEADACHE, CHRONIC (ICD-784.0) GERD (ICD-530.81) CHICKENPOX, HX OF (ICD-V15.9) Diabetes mellitus, type II Hyperlipidemia Rosacea  Past Surgical History: Reviewed history from 03/08/2009 and no changes required. 06-30-1996 C-section of triplets 04-02-04 and 01-15-2006 c-sections singles Appendix 2006 BTL  Family History: Reviewed history from 04/11/2009 and no changes required. Family History of Arthritis Family History Breast cancer 1st degree relative <50 Family History Diabetes 1st degree relative Family History High cholesterol Family History Hypertension Family History of Stroke F 1st degree relative <60 Family History of Stroke M 1st degree relative <50  Mother: Breast CA  Social History: Reviewed history from 03/08/2009 and no changes required. Occupation:Music Minister Married Alcohol use-no Drug use-no Regular exercise-no  Review of Systems General:  Denies fatigue, fever, loss of appetite, and weakness. CV:  Denies chest pain or discomfort. Resp:  Denies shortness of breath. GI:  Denies indigestion.  Physical Exam  General:  Overweight femal einNAd Head:  no maxillary sinus ttp. Eyes:  No corneal or conjunctival inflammation noted. EOMI. Perrla. Funduscopic exam benign, without hemorrhages, exudates or papilledema. Vision grossly normal. Ears:  clear fluid B TMs Nose:  nasal discharge, no mucosal pallor.   Mouth:  Oral mucosa and oropharynx  without lesions or exudates.  Teeth in good repair. Neck:  no carotid bruit or thyromegaly no cervical or supraclavicular lymphadenopathy  Lungs:  Normal respiratory effort, chest expands symmetrically. Lungs are clear to auscultation, no crackles or wheezes. Heart:  Normal rate and regular rhythm. S1 and S2 normal without  gallop, murmur, click, rub or other extra sounds.   Impression & Recommendations:  Problem # 1:  ACUTE PHARYNGITIS (ICD-462) resolved with antibiotics.[name] Kitchenno clear sign of complications such as abcesss.   The following medications were removed from the medication list:    Penicillin V Potassium 500 Mg Tabs (Penicillin v potassium) .[name]... 1 tab by mouth three times a day x 10 days Her updated medication list for this problem includes:    Meloxicam 15 Mg Tabs (Meloxicam) .[name]... 1 by mouth daily  Problem # 2:  EUSTACHIAN TUBE DYSFUNCTION, RIGHT (ICD-381.81) Nasal steroid spray 2 sprays per nostril daily. Nasal saline spray or irrigation. Decongestant .. sudafed daily for 3-4 days unless side effects.  Call if symptoms worsening, any fever, difficulty swallowing or not improving in 7-10 days.   Complete Medication List: 1)  Blood Glucose Test Strips, 250.00  .... Check blood sugar two times a day 2)  Blood Sugar Lancets, 250.00  .... Check blood sugar two times a day 3)  Janumet 50-1000 Mg Tabs (Sitagliptin-metformin hcl) .[name].. 1 by mouth two times a day 4)  Accucheck Aviva Test Strips  .[name] KitchenMarland Kitchen. 250.00 check bs two times a day 5)  Accucheck Aviva Lancets  .[name] KitchenMarland Kitchen. 250.00 check bs two times a day 6)  Meloxicam 15 Mg Tabs (Meloxicam) .[name].. 1 by mouth daily 7)  Voltaren 1 % Gel (Diclofenac sodium) .... Qid to hands (3 grams) as needed 8)  Fluticasone Propionate 50 Mcg/act Susp (Fluticasone propionate) .... 2 sprays per nostril daily  Patient Instructions: 1)  Nasla steroid spray 2 sprays per nostril daily. 2)  Nasal saline spray or irrigation. 3)  Decongestant .. sudafed daily for 3-4 days unless side effects.  4)  Call if symptoms worsening, any fever, difficulty swallowing or neot improving in 7-10 days.  Prescriptions: FLUTICASONE PROPIONATE 50 MCG/ACT SUSP (FLUTICASONE PROPIONATE) 2 sprays per nostril daily  #1 x 0   Entered and Authorized by:   Kerby Nora MD   Signed by:   Kerby Nora MD on  12/12/2009   Method used:   [facility]        [facility] (retail)       [address]       [place], [place]  [address]       Ph: [phone] or [phone]       Fax: [phone]   RxID:   [id number]   Current Allergies (reviewed today): No known allergies

## 2010-08-02 NOTE — Letter (Signed)
Summary: Stacey Drain MD  Stacey Drain MD   Imported By: Sherian Rein 03/16/2010 11:48:11  _____________________________________________________________________  External Attachment:    Type:   Image     Comment:   External Document

## 2010-08-02 NOTE — Assessment & Plan Note (Signed)
Summary: pain in fingers and joints/dlo   Vital Signs:  Patient profile:   40 year old female Height:      69 inches Weight:      207.6 pounds BMI:     30.77 Temp:     98.7 degrees F oral Pulse rate:   88 / minute Pulse rhythm:   regular BP sitting:   110 / 70  (left arm) Cuff size:   regular  Vitals Entered By: Benny Lennert CMA Duncan Dull) (Nov 16, 2009 8:18 AM)  History of Present Illness: Chief complaint pain in fingers and joints going on for a while but, worse in last 2 months  No DM follow-up: 125 - 130 avg, doing well. no complications, tol all meds  joint pain:  Pl[employer]ys the [employer] for a living. Back in march did some practicing for about four or five hours.  Now are still hurting all the time.   R fifth and her L thumb. Sometimes swollen.   Swollen some thins mornin. no red or hot joints.  Swelling in the morning.   Noticed that is also really tired Has five kids. When waking p, feels tired and beat up. 10-11 hours of sleep.  Still has soe redness on her face.     Current Problems (verified): 1)  Arthralgia  (ICD-719.40) 2)  Hyperlipidemia  (ICD-272.4) 3)  Acne Rosacea  (ICD-695.3) 4)  Health Maintenance Exam  (ICD-V70.0) 5)  Fatigue  (ICD-780.79) 6)  Diabetes Mellitus, Type II  (ICD-250.00) 7)  Family History Breast Cancer 1st Degree Relative <50  (ICD-V16.3) 8)  Headache, Chronic  (ICD-784.0) 9)  Gerd  (ICD-530.81) 10)  Chickenpox, Hx of  (ICD-V15.9)  Allergies (verified): No Known Drug Allergies  Past History:  Past medical, surgical, family and social histories (including risk factors) reviewed, and no changes noted (except as noted below).  Past Medical History: Reviewed history from 04/11/2009 and no changes required. UTI'S, HX OF (ICD-V13.00) HEADACHE, CHRONIC (ICD-784.0) GERD (ICD-530.81) CHICKENPOX, HX OF (ICD-V15.9) Diabetes mellitus, type II Hyperlipidemia Rosacea  Past Surgical History: Reviewed history from 03/08/2009 and no  changes required. 06-30-1996 C-section of triplets 04-02-04 and 01-15-2006 c-sections singles Appendix 2006 BTL  Family History: Reviewed history from 04/11/2009 and no changes required. Family History of Arthritis Family History Breast cancer 1st degree relative <50 Family History Diabetes 1st degree relative Family History High cholesterol Family History Hypertension Family History of Stroke F 1st degree relative <60 Family History of Stroke M 1st degree relative <50  Mother: Breast CA  Social History: Reviewed history from 03/08/2009 and no changes required. Occupation:Music Minister Married Alcohol use-no Drug use-no Regular exercise-no  Review of Systems       ROS: as above msk c/o no numbness, tingling facial rash  Physical Exam  General:  Well-developed,well-nourished,in no acute distress; alert,appropriate and cooperative throughout examination Head:  Normocephalic and atraumatic without obvious abnormalities. No apparent alopecia or balding. Ears:  no external deformities.   Nose:  no external deformity.   Neck:  No deformities, masses, or tenderness noted. Msk:  B hand - no / minimal tenosynovitis Ecchymosis or edema: neg ROM wrist/hand/digits: full  Carpals, MCP's, digits: NT Distal Ulna and Radius: NT Ecchymosis or edema: neg No instability Cysts/nodules: 1 noted, dip Digit triggering: neg Finkelstein's test: neg Snuffbox tenderness: neg Scaphoid tubercle: NT Resisted supination: NT Full composite fist, no malrotation Grip, all digits: 5/5 str DIPJT: NT PIP JT: NT MCP JT: NT No tenosynovitis Axial load test: neg Phalen's: neg Tinel's:  neg Atrophy: neg  Hand sensation: intact  Cervical Nodes:  No lymphadenopathy noted Psych:  Cognition and judgment appear intact. Alert and cooperative with normal attention span and concentration. No apparent delusions, illusions, hallucinations   Impression & Recommendations:  Problem # 1:  ARTHRALGIA  (ICD-719.40) Assessment New Expect likely oa no fh rheum disease polyarticular - will do basic rheum labs  pianist - discussed treatment in detail  Orders: T- Specimen Handling (16109) T-ANA (60454-09811) TLB-CRP-High Sensitivity (C-Reactive Protein) (86140-FCRP) TLB-Rheumatoid Factor (RA) (91478-GN) TLB-Sedimentation Rate (ESR) (85652-ESR)  Problem # 2:  DIABETES MELLITUS, TYPE II (ICD-250.00) Assessment: Improved  Her updated medication list for this problem includes:    Janumet 50-1000 Mg Tabs (Sitagliptin-metformin hcl) .[name]... 1 by mouth two times a day  Orders: Venipuncture (56213) TLB-BMP (Basic Metabolic Panel-BMET) (80048-METABOL) TLB-A1C / Hgb A1C (Glycohemoglobin) (83036-A1C) TLB-Microalbumin/Creat Ratio, Urine (82043-MALB)  Complete Medication List: 1)  Blood Glucose Test Strips, 250.00  .... Check blood sugar two times a day 2)  Blood Sugar Lancets, 250.00  .... Check blood sugar two times a day 3)  Metrocream 0.75 % Crea (Metronidazole) .... Apply to face two times a day 4)  Zocor 40 Mg Tabs (Simvastatin) .[name].. 1 by mouth at bedtime 5)  Janumet 50-1000 Mg Tabs (Sitagliptin-metformin hcl) .[name].. 1 by mouth two times a day 6)  Accucheck Aviva Test Strips  .[name] KitchenMarland Kitchen. 250.00 check bs two times a day 7)  Accucheck Aviva Lancets  .[name] KitchenMarland Kitchen. 250.00 check bs two times a day 8)  Meloxicam 15 Mg Tabs (Meloxicam) .[name].. 1 by mouth daily 9)  Voltaren 1 % Gel (Diclofenac sodium) .... Qid to hands (3 grams) as needed  Patient Instructions: 1)  Tylenol: 2 tablets up to 3-4 times a day 2)  Regular NSAIDS are helpful (avoid in kidney disease and ulcers) 3)  Topical Capzaicin Cream, as needed (wear glove to put on) 4)  Topical Voltaren (NSAID) Gel can help  5)  Glucosamine and Chondroitin often helpful 6)  Omega-3 fish oils may help 7)  Ice joints on bad days, 20 min, 2-3 x / day 8)  WORK ON HAND REHAB AS DISCUSSED Prescriptions: VOLTAREN 1 %  GEL (DICLOFENAC SODIUM) qid to hands (3 grams)  as needed  #3 tubes x 11   Entered and Authorized by:   [name] MD   Signed by:   [name] MD on 11/16/2009   Method used:   Print then Give to Patient   RxID:   [id number] MELOXICAM 15 MG TABS (MELOXICAM) 1 by mouth daily  #30 x 5   Entered and Authorized by:   [name] MD   Signed by:   [name] MD on 11/16/2009   Method used:   Print then Give to Patient   RxID:   [id number]   Current Allergies (reviewed today): No known allergies

## 2010-08-02 NOTE — Assessment & Plan Note (Signed)
Summary: ABSCESS ON BUTTOCKS   Vital Signs:  Patient profile:   40 year old female Height:      69 inches Weight:      205 pounds BMI:     30.38 Temp:     101.2 degrees F oral Pulse rate:   88 / minute Pulse rhythm:   regular BP sitting:   112 / 80  (left arm) Cuff size:   regular  Vitals Entered By: Delilah Shan CMA Duncan Dull) (September 28, 2009 12:21 PM) CC: Abscess on buttocks   History of Present Illness: 40 yo with abscess on right buttocks since Saturday. Started out as little pimple has now grown in size, very painful. Developed fever last night, temp here is 101.2. Very painful.  Sitting in hot bath tub this morning, it started to drain.  Has chills, nausea, no vomiting.   Never had anything like this before.  Current Medications (verified): 1)  Blood Glucose Test Strips, 250.00 .... Check Blood Sugar Two Times A Day 2)  Blood Sugar Lancets, 250.00 .... Check Blood Sugar Two Times A Day 3)  Metrocream 0.75 % Crea (Metronidazole) .... Apply To Face Two Times A Day 4)  Zocor 40 Mg Tabs (Simvastatin) .[name].. 1 By Mouth At Bedtime 5)  Janumet 50-1000 Mg Tabs (Sitagliptin-Metformin Hcl) .[name].. 1 By Mouth Two Times A Day 6)  Accucheck Aviva Test Strips .[name] KitchenMarland Kitchen. 250.00 Check Bs Two Times A Day 7)  Accucheck Aviva Lancets .[name] KitchenMarland Kitchen. 250.00 Check Bs Two Times A Day 8)  Doxycycline Hyclate 100 Mg Caps (Doxycycline Hyclate) .... Take 1 Tab Twice A Day X 10 Days 9)  Percocet 7.5-500 Mg Tabs (Oxycodone-Acetaminophen) .[name].. 1 Tab Every 6 Hours As Needed For Pain.  Allergies (verified): No Known Drug Allergies  Review of Systems      See HPI General:  Denies chills and fever. GI:  Complains of nausea; denies vomiting.  Physical Exam  General:  Well-developed,well-nourished,in no acute distress; alert,appropriate and cooperative throughout examination febrile, normotensive Skin:  large abcess on right buttocks, firm, non fluctuant, on surface appears to have open area with drainage. Inguinal  Nodes:  R inguinal LN enlarged and R inguinal LN tender.     Impression & Recommendations:  Problem # 1:  CELLULITIS AND ABSCESS OF BUTTOCK (ICD-682.5) Assessment New Does not appear to be perianal but I am concerned that it tracks deeper given the large area of firmness under the abscess.  Will refer to surgery and may need deeper exploration to drain below surface.  Place on doxycylcine 100 mg two times a day, Zofran, and Percocet. Her updated medication list for this problem includes:    Doxycycline Hyclate 100 Mg Caps (Doxycycline hyclate) .[name]... Take 1 tab twice a day x 10 days  Orders: Surgical Referral (Surgery)  Complete Medication List: 1)  Blood Glucose Test Strips, 250.00  .... Check blood sugar two times a day 2)  Blood Sugar Lancets, 250.00  .... Check blood sugar two times a day 3)  Metrocream 0.75 % Crea (Metronidazole) .... Apply to face two times a day 4)  Zocor 40 Mg Tabs (Simvastatin) .[name].. 1 by mouth at bedtime 5)  Janumet 50-1000 Mg Tabs (Sitagliptin-metformin hcl) .[name].. 1 by mouth two times a day 6)  Accucheck Aviva Test Strips  .[name] KitchenMarland Kitchen. 250.00 check bs two times a day 7)  Accucheck Aviva Lancets  .[name] KitchenMarland Kitchen. 250.00 check bs two times a day 8)  Doxycycline Hyclate 100 Mg Caps (Doxycycline hyclate) .... Take 1 tab twice  a day x 10 days 9)  Percocet 7.5-500 Mg Tabs (Oxycodone-acetaminophen) .[name].. 1 tab every 6 hours as needed for pain.  Patient Instructions: 1)  Please stop by to see Shirlee Limerick on the way out.   Prescriptions: PERCOCET 7.5-500 MG TABS (OXYCODONE-ACETAMINOPHEN) 1 tab every 6 hours as needed for pain.  #30 x 0   Entered and Authorized by:   Ruthe Mannan MD   Signed by:   Ruthe Mannan MD on 09/28/2009   Method used:   Print then Give to Patient   RxID:   [id number] DOXYCYCLINE HYCLATE 100 MG CAPS (DOXYCYCLINE HYCLATE) Take 1 tab twice a day x 10 days  #20 x 0   Entered and Authorized by:   Ruthe Mannan MD   Signed by:   Ruthe Mannan MD on 09/28/2009   Method used:    [facility]        [facility]  [address] (retail)       [address]       [place], [place]  [address]       Ph: [phone] or [phone]       Fax: [phone]   RxID:   [id number]   Current Allergies (reviewed today): No known allergies

## 2010-08-02 NOTE — Assessment & Plan Note (Signed)
Summary: passed out this am, per Dr. Ahad Colarusso/nt   Vital Signs:  Patient profile:   40 year old female Height:      69 inches Weight:      203.50 pounds BMI:     30.16 Temp:     98.6 degrees F oral Pulse rate:   84 / minute Pulse rhythm:   regular BP sitting:   112 / 70  (left arm) Cuff size:   regular  Vitals Entered By: Delilah Shan CMA Duncan Dull) (July 27, 2009 3:37 PM) CC: Passed out this morning CBG Result 186   History of Present Illness: 41 yo female here for syncopal episode this morning.  Was on the toilet having abdominal cramping, diarrhea and sweating. Felt nauseated then passed out. Did not hit her head, landed on laundry. No one saw her pass out. Was not confused afterwards. Had a headache and "hazy" vision but all resolved when she woke up.  Does not like to throw up and has passed out before when she was about to vomit. No fevers. No sick contacts. No blood in stool or emesis.  No longer nauseated or having diarrhea. Drinking a lot of fluids. Vision normal now.  Current Medications (verified): 1)  Blood Glucose Test Strips, 250.00 .... Check Blood Sugar Two Times A Day 2)  Blood Sugar Lancets, 250.00 .... Check Blood Sugar Two Times A Day 3)  Metrocream 0.75 % Crea (Metronidazole) .... Apply To Face Two Times A Day 4)  Zocor 40 Mg Tabs (Simvastatin) .[name].. 1 By Mouth At Bedtime 5)  Janumet 50-1000 Mg Tabs (Sitagliptin-Metformin Hcl) .[name].. 1 By Mouth Two Times A Day 6)  Accucheck Aviva Test Strips .[name] KitchenMarland Kitchen. 250.00 Check Bs Two Times A Day 7)  Accucheck Aviva Lancets .[name] KitchenMarland Kitchen. 250.00 Check Bs Two Times A Day  Allergies (verified): No Known Drug Allergies  Review of Systems      See HPI General:  Denies chills and fever. GI:  Complains of diarrhea and nausea; denies bloody stools, dark tarry stools, and vomiting. Neuro:  Denies headaches.  Physical Exam  General:  Well-developed,well-nourished,in no acute distress; alert,appropriate and cooperative throughout  examination Eyes:  No corneal or conjunctival inflammation noted. EOMI. Perrla. Funduscopic exam benign, without hemorrhages, exudates or papilledema. Vision grossly normal. Mouth:  Oral mucosa and oropharynx without lesions or exudates.  Teeth in good repair. MMM Lungs:  Normal respiratory effort, chest expands symmetrically. Lungs are clear to auscultation, no crackles or wheezes. Heart:  Normal rate and regular rhythm. S1 and S2 normal without gallop, murmur, click, rub or other extra sounds. Abdomen:  Bowel sounds positive,abdomen soft and non-tender without masses, organomegaly or hernias noted. Neurologic:  alert & oriented X3, cranial nerves II-XII intact, strength normal in all extremities, and DTRs symmetrical and normal.   Psych:  Cognition and judgment appear intact. Alert and cooperative with normal attention span and concentration. No apparent delusions, illusions, hallucinations   Impression & Recommendations:  Problem # 1:  SYNCOPE, VASOVAGAL (ICD-780.2) Assessment New One episode which sounds like it occured after vagal surge with gastroenteritis.  Advised to drink a lot of fluids.  No longer nauseated so hopefully gastroenteritis will pass quickly.  If another episode occurs, will need full syncopal/cardiac work up.  Complete Medication List: 1)  Blood Glucose Test Strips, 250.00  .... Check blood sugar two times a day 2)  Blood Sugar Lancets, 250.00  .... Check blood sugar two times a day 3)  Metrocream 0.75 % Crea (Metronidazole) .... Apply  to face two times a day 4)  Zocor 40 Mg Tabs (Simvastatin) .[name].. 1 by mouth at bedtime 5)  Janumet 50-1000 Mg Tabs (Sitagliptin-metformin hcl) .[name].. 1 by mouth two times a day 6)  Accucheck Aviva Test Strips  .[name] KitchenMarland Kitchen. 250.00 check bs two times a day 7)  Accucheck Aviva Lancets  .[name] KitchenMarland Kitchen. 250.00 check bs two times a day  Other Orders: Fingerstick (16109) TLB-Glucose, QUANT (82947-GLU)  Current Allergies (reviewed today): No known  allergies   Laboratory Results   Blood Tests    Date/Time Reported: July 27, 2009 3:53 PM   CBG Random:: 186mg /dL      Appended Document: passed out this am, per Dr. Napoleon Form

## 2010-08-02 NOTE — Progress Notes (Signed)
Summary: Referral Rheumotologist  Phone Note Call from Patient   Caller: Patient Call For: [name] MD Summary of Call: Patient is calling for a referral to a rheumotologist because the last time that she saw Dr. Patsy Lager he had given her the maximum dose of Meloxicam and it is not doing any good.  Follow-up for Phone Call        I would recommend awating Dr. Cyndie Chime opinion upon his return.  Follow-up by: Kerby Nora MD,  January 29, 2010 4:58 PM  Additional Follow-up for Phone Call Additional follow up Details #1::        Patient informed that Dr. Patsy Lager is out until next week and message will be left for him to address when he returns. Sydell Axon LPN  January 29, 2010 5:03 PM     Additional Follow-up for Phone Call Additional follow up Details #2::    Reviewed this case again, ANA 1:40 is not really clinically significant. All other labs reassuring.  Send all my Rheum labs and notes. Follow-up by: [name] MD,  January 30, 2010 6:44 AM

## 2010-08-02 NOTE — Letter (Signed)
Summary: Stacey Drain MD Rheumatology  Stacey Drain MD Rheumatology   Imported By: Lanelle Bal 06/26/2010 13:14:04  _____________________________________________________________________  External Attachment:    Type:   Image     Comment:   External Document

## 2010-08-02 NOTE — Letter (Signed)
Summary: [facility]   Imported By: Lanelle Bal 11/09/2009 08:03:01  _____________________________________________________________________  External Attachment:    Type:   Image     Comment:   External Document

## 2010-08-02 NOTE — Progress Notes (Signed)
Summary: requesting something else for pain  Phone Note Call from Patient Call back at Home Phone [phone]   Caller: Patient Call For: Dr. Dayton Martes Summary of Call: Pt states you had seen her for an abscess on her buttocks, she is doing well with that- saw the surgeon today- but she is still having some pain and the  percocet that you gave her causes her face to itch.  She is asking if something else, not as strong, be called to [facility]. She has tried otc pain relievers but they dont help. Initial call taken by: Lowella Petties CMA,  October 03, 2009 4:08 PM    New/Updated Medications: TRAMADOL HCL 50 MG  TABS (TRAMADOL HCL) 1 tab by mouth every 8 hours as needed for pain. Prescriptions: TRAMADOL HCL 50 MG  TABS (TRAMADOL HCL) 1 tab by mouth every 8 hours as needed for pain.  #60 x 0   Entered and Authorized by:   Ruthe Mannan MD   Signed by:   Ruthe Mannan MD on 10/04/2009   Method used:   [facility]        [facility]  [address] (retail)       [address]       [place], [place]  [address]       Ph: [phone] or [phone]       Fax: [phone]   RxID:   [id number]

## 2010-08-02 NOTE — Letter (Signed)
Summary: Rheumatology-Dr. Stacey Drain  Rheumatology-Dr. Stacey Drain   Imported By: Maryln Gottron 04/16/2010 15:28:37  _____________________________________________________________________  External Attachment:    Type:   Image     Comment:   External Document

## 2010-08-20 ENCOUNTER — Other Ambulatory Visit: Payer: Self-pay | Admitting: Family Medicine

## 2010-08-20 ENCOUNTER — Encounter: Payer: Self-pay | Admitting: Family Medicine

## 2010-08-20 ENCOUNTER — Ambulatory Visit (INDEPENDENT_AMBULATORY_CARE_PROVIDER_SITE_OTHER): Payer: PRIVATE HEALTH INSURANCE | Admitting: Family Medicine

## 2010-08-20 DIAGNOSIS — R51 Headache: Secondary | ICD-10-CM

## 2010-08-20 DIAGNOSIS — E785 Hyperlipidemia, unspecified: Secondary | ICD-10-CM

## 2010-08-20 DIAGNOSIS — F339 Major depressive disorder, recurrent, unspecified: Secondary | ICD-10-CM | POA: Insufficient documentation

## 2010-08-20 DIAGNOSIS — F329 Major depressive disorder, single episode, unspecified: Secondary | ICD-10-CM

## 2010-08-20 DIAGNOSIS — E119 Type 2 diabetes mellitus without complications: Secondary | ICD-10-CM

## 2010-08-20 LAB — MICROALBUMIN / CREATININE URINE RATIO
Creatinine,U: 195.2 mg/dL
Microalb Creat Ratio: 1.5 mg/g (ref 0.0–30.0)
Microalb, Ur: 2.9 mg/dL — ABNORMAL HIGH (ref 0.0–1.9)

## 2010-08-20 LAB — HEMOGLOBIN A1C: Hgb A1c MFr Bld: 8.1 % — ABNORMAL HIGH (ref 4.6–6.5)

## 2010-08-20 LAB — BASIC METABOLIC PANEL
BUN: 12 mg/dL (ref 6–23)
Chloride: 104 mEq/L (ref 96–112)
GFR: 108.96 mL/min (ref >60.00)
Glucose, Bld: 137 mg/dL — ABNORMAL HIGH (ref 70–99)
Potassium: 4.4 mEq/L (ref 3.5–5.1)

## 2010-08-28 NOTE — Assessment & Plan Note (Signed)
Summary: HA,DISCUSS DIABETES MEDICATION/CLE  MEDCOST   Vital Signs:  Patient profile:   40 year old female Height:      69 inches Weight:      207.50 pounds BMI:     30.75 Temp:     98.5 degrees F oral Pulse rate:   72 / minute Pulse rhythm:   regular BP sitting:   110 / 70  (left arm) Cuff size:   regular  Vitals Entered By: Benny Lennert CMA Duncan Dull) (August 20, 2010 11:00 AM)  History of Present Illness: Chief complaint headaches and discuss diabetes  DM: she believes her DM has been fairly well controlled, but has been overwhelmed recently and not checked BS  HA: 06/2011. 5/7 days a week.  Will happen in the evening, more in the posterior aspect, posterior nape of neck.  Sometimes will have blurred vision no n/v/d/ no prob with light or sound.   Found out his huband  problem with opiod addiction, found out a couple weeks ago. Very distressing. Not sure how to get him help.  depression: cries in the exam room, feels depressed. sometimes anxious. no si or hi. somewhat helpless. not sleeping well.   Allergies (verified): No Known Drug Allergies  Past History:  Past medical, surgical, family and social histories (including risk factors) reviewed, and no changes noted (except as noted below).  Past Medical History: Reviewed history from 04/11/2009 and no changes required. UTI'S, HX OF (ICD-V13.00) HEADACHE, CHRONIC (ICD-784.0) GERD (ICD-530.81) CHICKENPOX, HX OF (ICD-V15.9) Diabetes mellitus, type II Hyperlipidemia Rosacea  Past Surgical History: Reviewed history from 03/08/2009 and no changes required. 06-30-1996 C-section of triplets 04-02-04 and 01-15-2006 c-sections singles Appendix 2006 BTL  Family History: Reviewed history from 04/11/2009 and no changes required. Family History of Arthritis Family History Breast cancer 1st degree relative <50 Family History Diabetes 1st degree relative Family History High cholesterol Family History  Hypertension Family History of Stroke F 1st degree relative <60 Family History of Stroke M 1st degree relative <50  Mother: Breast CA  Social History: Reviewed history from 03/08/2009 and no changes required. Occupation:Music Minister Married Alcohol use-no Drug use-no Regular exercise-no  Review of Systems      See HPI General:  Complains of fatigue; denies chills, fever, and weight loss. Psych:  See HPI.  Physical Exam  General:  Well-developed,well-nourished,in no acute distress; alert,appropriate and cooperative throughout examination Head:  Normocephalic and atraumatic without obvious abnormalities. No apparent alopecia or balding. Ears:  no external deformities.   Nose:  no external deformity.   Lungs:  Normal respiratory effort, chest expands symmetrically. Lungs are clear to auscultation, no crackles or wheezes. Heart:  Normal rate and regular rhythm. S1 and S2 normal without gallop, murmur, click, rub or other extra sounds. Psych:  tearful, mildly labile affect.   Impression & Recommendations:  Problem # 1:  DIABETES MELLITUS, TYPE II (ICD-250.00)  Her updated medication list for this problem includes:    Metformin Hcl 1000 Mg Tabs (Metformin hcl) .[name]... Take one tablet by mouth 2 times daily    Glipizide 5 Mg Tabs (Glipizide) .[name]... Take one tablet by mouth 2 times daily  Orders: Venipuncture (16010) TLB-A1C / Hgb A1C (Glycohemoglobin) (83036-A1C) TLB-Microalbumin/Creat Ratio, Urine (82043-MALB) TLB-BMP (Basic Metabolic Panel-BMET) (80048-METABOL)  Labs Reviewed: Creat: 0.5 (11/16/2009)     Last Eye Exam: normal (09/29/2008) Reviewed HgBA1c results: 7.3 (11/16/2009)  8.3 (03/08/2009)  Problem # 2:  HEADACHE, CHRONIC (ICD-784.0) Assessment: Deteriorated believe c/w tension HA, prob exacerbated now with depression.  The following medications were removed from the medication list:    Meloxicam 15 Mg Tabs (Meloxicam) .[name]... 1 by mouth daily  Problem # 3:   DEPRESSIVE DISORDER (ICD-311) Assessment: Deteriorated acute situation, h/o post-partum and did well on zoloft. tough situation with husband - gave her the names of resources that can be used, na, behavioral health.  Her updated medication list for this problem includes:    Zoloft 50 Mg Tabs (Sertraline hcl) .[name]... 1 by mouth daily  Problem # 4:  HYPERLIPIDEMIA (ICD-272.4)  Orders: TLB-Cholesterol, Direct LDL (83721-DIRLDL)  Complete Medication List: 1)  Accucheck Aviva Test Strips  .[name] KitchenMarland Kitchen. 250.00 check bs two times a day 2)  Accucheck Aviva Lancets  .[name] KitchenMarland Kitchen. 250.00 check bs two times a day 3)  Metformin Hcl 1000 Mg Tabs (Metformin hcl) .... Take one tablet by mouth 2 times daily 4)  Glipizide 5 Mg Tabs (Glipizide) .... Take one tablet by mouth 2 times daily 5)  Zoloft 50 Mg Tabs (Sertraline hcl) .[name].. 1 by mouth daily  Patient Instructions: 1)  f/u 1 month Prescriptions: ZOLOFT 50 MG TABS (SERTRALINE HCL) 1 by mouth daily  #30 x 2   Entered and Authorized by:   [name] MD   Signed by:   [name] MD on 08/20/2010   Method used:   [facility]        [facility]  [id number]       [address]       [place], [place]  [address]       Ph: [phone] or [phone]       Fax: [phone]   RxID:   [id number]    Orders Added: 1)  Venipuncture [36415] 2)  TLB-A1C / Hgb A1C (Glycohemoglobin) [83036-A1C] 3)  TLB-Microalbumin/Creat Ratio, Urine [82043-MALB] 4)  TLB-BMP (Basic Metabolic Panel-BMET) [80048-METABOL] 5)  TLB-Cholesterol, Direct LDL [83721-DIRLDL] 6)  Est. Patient Level IV [59563]    Current Allergies (reviewed today): No known allergies

## 2010-10-15 ENCOUNTER — Other Ambulatory Visit: Payer: Self-pay | Admitting: *Deleted

## 2010-10-15 MED ORDER — METFORMIN HCL 1000 MG PO TABS: 1000.0000 mg | ORAL_TABLET | Freq: Two times a day (BID) | ORAL | Status: DC

## 2010-10-31 ENCOUNTER — Encounter: Payer: Self-pay | Admitting: Family Medicine

## 2010-11-01 ENCOUNTER — Ambulatory Visit: Payer: PRIVATE HEALTH INSURANCE | Admitting: Family Medicine

## 2010-11-12 ENCOUNTER — Other Ambulatory Visit: Payer: Self-pay | Admitting: *Deleted

## 2010-11-16 NOTE — Discharge Summary (Signed)
Lindsey Mason, Lindsey Mason                   ACCOUNT NO.:  [id number]   MEDICAL RECORD NO.:  [id number]          PATIENT TYPE:  INP   LOCATION:  9142                          FACILITY:  [facility]   PHYSICIAN:  Naima A. Dillard, M.D. DATE OF BIRTH:  Dec 29, 1970   DATE OF ADMISSION:  04/02/2004  DATE OF DISCHARGE:                                 DISCHARGE SUMMARY   ADMISSION DIAGNOSES:  1.  Intrauterine pregnancy at term.  2.  Gestational diabetes requiring insulin.  3.  Desires repeat cesarean section.   DISCHARGE DIAGNOSES:  1.  Intrauterine pregnancy at term.  2.  Gestational diabetes requiring insulin.  3.  Desires repeat cesarean section.  4.  Status post cesarean delivery of a female infant, Apgars 9 and 9,      weighing 8 pounds 15 ounces.   HOSPITAL PROCEDURES:  1.  Spinal anesthesia.  2.  Repeat low transverse cesarean section.   HOSPITAL COURSE:  The patient was admitted for an elective repeat cesarean  section which was performed without difficulty.  Estimated blood loss was  750 mL.  On postoperative day #1, she was doing well.  Fasting blood sugar  was 92.  Hemoglobin was 10.6.  Incision was clean and dry and JP was  draining 25 mL per shift.  On postoperative day #2, the patient continued to  improve.  On postoperative day #3, she was ready to go home.  Vital signs  were stable, she was afebrile.  Chest clear, heart rate regular.  Abdomen  was soft and appropriately tender.  Dressing was clean, dry, and intact.  JP  was draining only scant amount of serosanguineous fluid.  Lochia was small,  extremities within normal limits.  She was deemed to have received the full  benefit of her hospital stay and was discharged home.   DISCHARGE MEDICATIONS:  1.  Motrin 600 mg p.o. q.6h. p.r.n.  2.  Tylox one to two p.o. q.4h. p.r.n.   DISCHARGE LABORATORY DATA:  Kleihaur-Betke shows 0.1% fetal cells.  White  blood cell count was 8.0, hemoglobin 10.6, platelets 207.  RPR was   nonreactive.   DISCHARGE INSTRUCTIONS:  Per [facility] handout.   DISCHARGE FOLLOW-UP:  In 6 weeks or p.r.n.      MLW/MEDQ  D:  04/05/2004  T:  04/05/2004  Job:  [id number]

## 2010-11-16 NOTE — H&P (Signed)
NAMEMARGAUX, Lindsey Mason                   ACCOUNT NO.:  [id number]   MEDICAL RECORD NO.:  [id number]           PATIENT TYPE:   LOCATION:                                 FACILITY:   PHYSICIAN:  Naima A. Dillard, M.D.      DATE OF BIRTH:   DATE OF ADMISSION:  04/01/2004  DATE OF DISCHARGE:                                HISTORY & PHYSICAL   CHIEF COMPLAINT:  39 weeks' gestational diabetic on insulin desires repeat  Cesarean section and considering tubal ligation.   HISTORY AND PHYSICAL:  Lindsey Mason is a 40 year old white female, gravida 2,  para 0-1-0-3 who is at 54 weeks presenting for a repeat Cesarean section.  The patient's pregnancy has been complicated by:  1.  History of previous C-section with triplets.  The patient has decided to      have another Cesarean section due to the estimated fetal weight of the      baby, which on September 22 was measured as 4100 grams.  2.  Gestational diabetic on insulin.  The patient had a three hour Glucola,      which was significant for diabetes, and a one hour Glucola at 18 weeks,      which was also significant for diabetes.  She is currently on insulin 28      of regular and 20 of N in the morning and 18 of regular with dinner and      20 of N at night.   The patient has a history of infertility and conceived triplets on Clomid,  but this pregnancy was conceived spontaneously.  Also, she is Rh negative  and received RhoGAM with this pregnancy.  The patient also had an  echocardiogram early in pregnancy, since she was on insulin so early, which  was found to be within normal limits.  Prenatal labs were significant for a  hemoglobin of 12.9, platelets of 277.  She is 0-.  Antibody early in  pregnancy was positive, secondary to she received RhoGAM.  RPR was  nonreactive.  Rubella immune.  Hepatitis B surface antigen was negative.  One hour Glucola was 200 and three hour Glucola was also abnormal.  Group B  Strep was positive.  Quad screen was within  normal limits.  Gonorrhea and  chlamydia both were negative.   PAST MEDICAL HISTORY:  Borderline diabetic.   PAST GYN HISTORY:  Significant for menarche at age 43, has always been  irregular.  History of polycystic ovarian syndrome.  History of infertility.   PAST MEDICAL HISTORY:  Significant for in December of 1997 she had a lower  transverse Cesarean section at 30 weeks and delivered triplets.  All are  currently doing well.   The patient has no known drug allergies.   MEDICATIONS:  Includes prenatal vitamins.   FAMILY HISTORY:  Significant for mother with hypertension.  Mother and  father both with type 2 diabetes.  Maternal grandfather with heart disease.   PAST SURGICAL HISTORY:  Significant for a primary low transverse Cesarean  section.   SOCIAL HISTORY:  The patient denies any alcohol or drug abuse.  She is  currently married with a supportive husband.   PHYSICAL EXAMINATION:  VITAL SIGNS:  The patient weighs 230 pounds.  Fundal  height was 40.  GENERAL:  The patient is in no apparent distress.  HEAD:  Normocephalic, atraumatic.  THYROID:  No enlarged.  HEART:  Regular rate and rhythm.  LUNGS:  Clear to auscultation bilaterally.  BREASTS:  No masses bilaterally.  No nipple discharge or axillary  tenderness.  ABDOMEN:  Gravid, soft and nontender.  Vulvovaginal exam, cervix 150 and -3.  EXTREMITIES:  Trace edema with no cyanosis or clubbing.   ASSESSMENT:  Gestational diabetic, A2, desiring repeat Cesarean section and  possible tubal.  The patient understands the risks are, but not limited to,  bleeding, infection, damage to internal organs such as bowel, bladder and  major blood vessels.  The patient was also given a VBAC form, but because of  the estimated fetal weight, has decided to proceed with Cesarean section.  She states that she may consider having a tubal ligation.  If so, she  understands the risks are, but not limited to, failure rate of about 1 in  200  to 1 in 300 and about half of those can result in ectopic pregnancy.       ___________________________________________  Pierre Bali Normand Sloop, M.D.    NAD/MEDQ  D:  04/01/2004  T:  04/02/2004  Job:  [id number]

## 2010-11-16 NOTE — Op Note (Signed)
NAMENICO, ROGNESS                   ACCOUNT NO.:  [id number]   MEDICAL RECORD NO.:  [id number]          PATIENT TYPE:  MAT   LOCATION:  MATC                          [facility]ACILITY:  [facility]   PHYSICIAN:  Naima A. Dillard, M.D. DATE OF BIRTH:  1971-06-29   DATE OF PROCEDURE:  04/02/2004  DATE OF DISCHARGE:  03/29/2004                                 OPERATIVE REPORT   PREOPERATIVE DIAGNOSES:  1.  Intrauterine pregnancy at term.  2.  Gestational diabetic requiring insulin.  3.  Desires elective cesarean section.   POSTOPERATIVE DIAGNOSIS:  1.  Intrauterine pregnancy at term.  2.  Gestational diabetic requiring insulin.  3.  Desires elective cesarean section.   PROCEDURE:  Repeat cesarean section.   SURGEON:  Naima A. Dillard, M.D.   ASSISTANTVance Gather Duplantis, DNM   ANESTHESIA:  Spinal.   INTRAVENOUS FLUIDS:  2600 mL of lactated Ringer's.   URINE OUTPUT:  125 mL of clear urine at end of procedure.   ESTIMATED BLOOD LOSS:  750 mL.   COMPLICATIONS:  None.   FINDINGS:  Female infant in vertex presentation with Apgars of 9 and 9  weight of 8, 15.  Normal-appearing uterus, tubes, and ovaries.  No meconium.  Clear fluids.  Nuchal cord x1 easily reduced.   PROCEDURE IN DETAIL:  The patient was taken to the operating room where she  was placed in dorsal supine position with a left lateral tilt, prepped and  draped in normal sterile fashion, Foley catheter was placed.  Once her  spinal anesthesia was found to be adequate, a Pfannenstiel skin incision was  made along the previous incision and carried down to the fascia using Bovie  cautery.  The fascia was incised in the midline, extended bilaterally using  Mayo scissors and pickups with teeth.  Kochers x2 placed in the superior  aspect of the fascia and was dissected out the rectus muscle both sharply  and bluntly.  The inferior aspect of the fascia were dissected off the  rectus muscles in a similar fashion.  The rectus muscles were  separated in  the midline.  The peritoneum was identified, tented up and entered sharply  and extended superiorly, inferiorly with good visualization of bowel and  bladder.  The vesicouterine peritoneum was then entered sharply and extended  bilaterally.  The bladder blade was inserted.  A lower transverse incision  was then made with the scalpel and extended bluntly.  Clear fluid was noted.  The infant was delivered without difficulty.  There was a nuchal cord x1  which was easily reduced.  The cord clamped and cut and the infant was  handed over to the waiting pediatricians.  The placenta was manually  extracted.  The uterus was cleared of all clot and debris.  The uterine  incision was repaired with 0 Vicryl in a running lock fashion.  A second  layer of imbrication was used and hemostasis was assured.  Irrigation was  then used and hemostasis was assured.  The peritoneum was closed with 0  chromic in a running fashion.  The muscle was inspected and noted to be  hemostatic.  The fascia was closed using 0 Vicryl in a running fashion.  Hemostasis was assured.  The subcutaneous fascia was irrigated and made  hemostatic using Bovie cautery.  A 10-flat JP drain was placed into the  abdominal cavity.  The subcutaneous tissue was reapproximated using 2-0  plain.  The skin was closed with staples.  Sponge, lap, and needle counts  were correct x2.  The patient went to recovery room in stable condition.      NAD/MEDQ  D:  04/02/2004  T:  04/02/2004  Job:  [id number]

## 2010-11-16 NOTE — Discharge Summary (Signed)
NAMEAMATULLAH, Lindsey Mason                   ACCOUNT NO.:  [id number]   MEDICAL RECORD NO.:  [id number]          PATIENT TYPE:  INP   LOCATION:  9129                          [facility]ACILITY:  [facility]   PHYSICIAN:  Rolm Gala, M.D.    DATE OF BIRTH:  09-06-1970   DATE OF ADMISSION:  01/15/2006  DATE OF DISCHARGE:  01/18/2006                                 DISCHARGE SUMMARY   ATTENDING PHYSICIAN:  Ginger Carne, M.D.   ADMISSION DIAGNOSES:  1.  Intrauterine pregnancy at term.  2.  Gestational diabetes requiring metformin.  3.  Desires repeat cesarean section and bilateral tubal ligation.   DISCHARGE DIAGNOSES:  1.  Intrauterine pregnancy at term.  2.  Gestational diabetes requiring metformin.  3.  Status post repeat cesarean section, bilateral tubal ligation.  4.  Delivery of female infant, Apgars 8 and 9, weight 8 pounds 2 ounces.   HOSPITAL COURSE:  This patient was admitted on July 18 for having  contractions and lower abdominal pain.  The patient was found to be in labor  and elected to have a C-section.  The C-section was done on January 15, 2006 by  Dr. Shawnie Pons, assisted by Dr. Mayford Knife.  There were no complications during the  C-section.  The patient was given Filshie clips for the bilateral tubal  ligation.  The estimated blood loss was less than a liter.  On postoperative  day number one, the patient was doing well.  Her blood sugars were 183 and  104.  That was before she started back on her metformin.  This morning her  blood sugars in the last 24 hours include 149 and 112.  Hemoglobin on  admission was 10.3.  The patient will be started on iron and Colace at  discharge.  On day of discharge, the incision was clean, dry and intact.  The patient continued to improve on postop day two and day three, and on day  three she is ready to go home.  Vitals are stable and the patient was  afebrile on day of discharge.  Lochia was less than menses.   DISCHARGE MEDICATIONS:  1.  Percocet 5/325 one  p.o. every 4 p.r.n. pain, #20.  2.  Ibuprofen 600 mg one p.o. every 6 p.r.n. pain, #30.  3.  Iron pills 325 mg one p.o. b.i.d., #62, refills two.  4.  Colace 100 mg p.o. b.i.d., #62, refills two.  5.  Metformin 850 mg one p.o. b.i.d.   DISCHARGE LABORATORY DATA:  CBC on July 19 showed white blood cells 9.5, H&H  10.3 and 30.3, platelets 218.  The patient was given RhoGAM on December 02, 2005  and January 16, 2006.   DISCHARGE INSTRUCTIONS:  The patient was told to [facility] up [facility] in six weeks.  She was also told to make an appointment with a  primary doctor (she does not currently have one) who can treat her PCOS and  her diabetes type 2.  The patient was discharged in improved condition.  She  was a full code during her  entire hospital stay.      Rolm Gala, M.D.     [name]  D:  01/18/2006  T:  01/18/2006  Job:  [id number]

## 2010-11-16 NOTE — Op Note (Signed)
Lindsey Mason, Lindsey Mason                  ACCOUNT NO.:  [id number]   MEDICAL RECORD NO.:  [id number]           PATIENT TYPE:   LOCATION:                                [facility]   PHYSICIAN:  Tanya S. Shawnie Pons, M.D.        DATE OF BIRTH:   DATE OF PROCEDURE:  01/15/2006  DATE OF DISCHARGE:                                 OPERATIVE REPORT   PREOPERATIVE DIAGNOSES:  1.  Intrauterine pregnancy at 37 weeks.  2.  Previous C-section x2.  3.  Class B diabetes mellitus.  4.  Active labor.   POSTOPERATIVE DIAGNOSES:  1.  Intrauterine pregnancy at 37 weeks.  2.  Previous C-section x2.  3.  Class B diabetes mellitus.  4.  Active labor.   PROCEDURE:  1.  Repeat low transverse cesarean section.  2.  Postpartum tubal bilateral tubal ligation.   SURGEON:  Shelbie Proctor. Shawnie Pons, M.D.   ASSISTANT:  Marc Morgans. Mayford Knife, M.D.   ANESTHESIA:  Spinal epidural.   FINDINGS:  Viable female infant, Apgars 8 and 9 with weight 8 pounds 2  ounces.   ESTIMATED BLOOD LOSS:  1000 mL.   COMPLICATIONS:  None.   SPECIMENS:  Placenta to pathology.   REASON FOR PROCEDURE:  The patient is a 40 year old gravida 3, para 1-1-0-4  who had had 2 prior sections and was diabetic.  She came in active labor  today with a cervical exam of 5 cm and desired a repeat as well as a tubal  ligation.   DESCRIPTION OF PROCEDURE:  The patient was brought to the OR and she was  placed in the supine position with a left lateral tilt.  After anesthesia  was felt to be adequate a Foley was placed inside the bladder and she was  prepped in the usual sterile fashion.  Anesthesia was not adequate and she  was sat up for an epidural and then replaced and prepped again.  Following  this she passed an Allen's test and a knife was used to make a Pfannenstiel  incision through the skin taking down to the underlying fascia sharply which  was divided in the midline.  Fascial incision was extended laterally  sharply.  Two Kocher clamps were used  to elevate the anterior and superior  edges of the fascia and underlying rectus which was dissected bluntly  laterally and sharply in the midline.  The peritoneal cavity was entered  sharply and the peritoneum was taken down under direct visualization.  The  bladder was placed inside the incision.  The bladder flap was created  sharply with Metzenbaum scissors followed by blunt dissection off the  uterus.   A knife was then used to make a low transverse incision on the uterus and  was carried down to the amniotic cavity where clear fluid was noted.  The  uterine incision was extended with the bandage scissors and the AROM showed  clear fluid.  The infant was in vertex and delivered atraumatically, bulb  suctioned and positive crown on the perineum.  The cord was clamped  x2 and  was taken to awaiting pediatrics.  Cord blood was obtained.  The placenta  was delivered easily; and the uterus was cleaned out with dry lap pads. The  edges of the uterine incision were grasped with Allis clamps and the uterine  incision closed with a #0 Vicryl suture in a box running fashion.   Pressure was then applied to the incision while attention was turned to the  tubes.  The left and right tubes were both identified, followed to their  fimbriated ends, grasped with Babcock clamps and Filshie clip placed across  them under direct visualization approximately 1-2 cm from the cornu  bilaterally.  Tubes were returned to the abdomen.  The incision was  inspected and found to be hemostatic. Any area that was not was cauterized  with the electrocautery.   Attention was then turned to the rectus.  Any bleeders were cauterized with  the electrocautery.  The fascia was closed with a #0 Vicryl suture in a  running fashion.  Any bleeders were cauterized with the electrocautery and  the skin closed using clips.  All instrument, needle and lap counts correct  x2.  Infant was taken to the newborn nursery and mom was  taken to PACU in  stable condition.           ______________________________  Shelbie Proctor Shawnie Pons, M.D.     TSP/MEDQ  D:  01/15/2006  T:  01/16/2006  Job:  [id number]

## 2010-12-20 ENCOUNTER — Ambulatory Visit (INDEPENDENT_AMBULATORY_CARE_PROVIDER_SITE_OTHER): Payer: PRIVATE HEALTH INSURANCE | Admitting: Family Medicine

## 2010-12-20 ENCOUNTER — Encounter: Payer: Self-pay | Admitting: Family Medicine

## 2010-12-20 VITALS — BP 130/88 | HR 72 | Temp 98.6°F | Ht 66.0 in | Wt 211.8 lb

## 2010-12-20 DIAGNOSIS — M7541 Impingement syndrome of right shoulder: Secondary | ICD-10-CM

## 2010-12-20 DIAGNOSIS — M25819 Other specified joint disorders, unspecified shoulder: Secondary | ICD-10-CM

## 2010-12-20 MED ORDER — SERTRALINE HCL 50 MG PO TABS: 50.0000 mg | ORAL_TABLET | Freq: Every day | ORAL | Status: DC

## 2010-12-20 NOTE — Progress Notes (Signed)
Keyundra Fant, a 40 y.o. 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  The patient denies neck pain or radicular symptoms. Denies dislocation, subluxation, separation of the shoulder. The patient does complain of pain in the overhead plane with significant painful arc of motion.  Medications Tried: Tylenol, NSAIDS Ice or Heat: minimally helpful Tried PT: No  Prior shoulder Injury: No Prior surgery: No Prior fracture: No  The PMH, PSH, Social History, Family History, Medications, and allergies have been reviewed in [facility], and have been updated if relevant.  REVIEW OF SYSTEMS  GEN: No fevers, chills. Nontoxic. Primarily MSK c/o today. MSK: Detailed in the HPI GI: tolerating PO intake without difficulty Neuro: No numbness, parasthesias, or tingling associated. Otherwise the pertinent positives of the ROS are noted above.   PHYSICAL EXAM  Blood pressure 130/88, pulse 72, temperature 98.6 F (37 C), temperature source Oral, height 5\' 6"  (1.676 m), weight 211 lb 12.8 oz (96.072 kg), SpO2 98.00%.  GEN: Well-developed,well-nourished,in no acute distress; alert,appropriate and cooperative throughout examination HEENT: Normocephalic and atraumatic without obvious abnormalities. Ears, externally no deformities PULM: Breathing comfortably in no respiratory distress EXT: No clubbing, cyanosis, or edema PSYCH: Normally interactive. Cooperative during the interview. Pleasant. Friendly and conversant. Not anxious or depressed appearing. Normal, full affect.  Shoulder: R Inspection: No muscle wasting or winging Ecchymosis/edema: neg  AC joint, scapula, clavicle: NT Cervical spine: NT, full ROM Spurling's: neg Abduction: full, 5/5 Flexion: full, 5/5 IR, full, lift-off: 5/5 ER at  neutral: full, 5/5 AC crossover: neg Neer: pos Hawkins: pos Drop Test: neg Empty Can: pos Supraspinatus insertion: mild-mod T Bicipital groove: NT Speed's: pos Yergason's: neg Sulcus sign: neg Scapular dyskinesis: none C5-T1 intact  Neuro: Sensation intact Grip 5/5    1. Rotator Cuff tendinopathy:  >25 minutes spent in face to face time with patient, >50% spent in counselling or coordination of care  Classic impingement signs, probable subacromial bursitis as well as possibly a small degree of bicipital tendinitis on the right  Rotator cuff strengthening and scapular stabilization exercises were reviewed with the patient.  [facility] RTC and scapular stabilization program given to the patient. Retraining shoulder mechanics and function was emphasized to the patient with rehab done at least 5-6 days a week.  formal PT to assist with scapular stabilization and RTC strengthening.

## 2010-12-20 NOTE — Patient Instructions (Signed)
REFERRAL: GO THE THE FRONT ROOM AT THE ENTRANCE OF OUR CLINIC, NEAR CHECK IN. ASK FOR MARION. SHE WILL HELP YOU SET UP YOUR REFERRAL. DATE: TIME:  Alleve 2 tabs by mouth two tmes a day over the counter: Take at least for 2 - 3 weeks. This is equal to a prescripton strength dose (GENERIC CHEAPER EQUIVALENT IS NAPROXEN SODIUM)

## 2011-04-08 ENCOUNTER — Other Ambulatory Visit: Payer: Self-pay | Admitting: Family Medicine

## 2011-04-08 DIAGNOSIS — Z1231 Encounter for screening mammogram for malignant neoplasm of breast: Secondary | ICD-10-CM

## 2011-04-30 ENCOUNTER — Ambulatory Visit (INDEPENDENT_AMBULATORY_CARE_PROVIDER_SITE_OTHER): Payer: Self-pay | Admitting: Family Medicine

## 2011-04-30 ENCOUNTER — Encounter: Payer: Self-pay | Admitting: Family Medicine

## 2011-04-30 VITALS — BP 120/70 | HR 81 | Temp 98.5°F | Ht 66.0 in | Wt 215.1 lb

## 2011-04-30 DIAGNOSIS — M7500 Adhesive capsulitis of unspecified shoulder: Secondary | ICD-10-CM

## 2011-04-30 MED ORDER — METRONIDAZOLE 1 % EX CREA: TOPICAL_CREAM | Freq: Every day | CUTANEOUS | Status: AC

## 2011-04-30 MED ORDER — TRAMADOL HCL 50 MG PO TABS: 50.0000 mg | ORAL_TABLET | Freq: Four times a day (QID) | ORAL | Status: DC | PRN

## 2011-04-30 NOTE — Progress Notes (Signed)
  Subjective:    Patient ID: Lindsey Mason, female    DOB: 10/31/1970, 40 y.o.   MRN: [id number]  HPI  Lindsey Mason, a 40 y.o. female presents today in the office for the following:    R arm and shoulder is hurting right now. Hurting all the time, kind of like an achy bit. Hurts with pressing. Cannot really raise her arm. Having a difificult time raising her R arm. Trouble with her daily activities.  Once was walking, jabbed her arm up. Held arm still at that time.   The patient noted above presents with shoulder pain that has been ongoing for 4 months there is of acute pain one time a month or so ago - she had pitched 3 softball games.  The patient denies neck pain or radicular symptoms. No shoulder blade pain Denies dislocation, subluxation, separation of the shoulder. The patient does complain of pain in the overhead plane. Significant restriction of motion in all planes  Medications Tried: Motrin  Prior shoulder Injury: No Prior surgery: No Prior fracture: No  The PMH, PSH, Social History, Family History, Medications, and allergies have been reviewed in [facility], and have been updated if relevant.   REVIEW OF SYSTEMS  GEN: No fevers, chills. Nontoxic. Primarily MSK c/o today. MSK: Detailed in the HPI GI: tolerating PO intake without difficulty Neuro: No numbness, parasthesias, or tingling associated. Otherwise the pertinent positives of the ROS are noted above.    PHYSICAL EXAM  Blood pressure 120/70, pulse 81, temperature 98.5 F (36.9 C), temperature source Oral, height 5\' 6"  (1.676 m), weight 215 lb 1.9 oz (97.578 kg), SpO2 99.00%.  GEN: Well-developed,well-nourished,in no acute distress; alert,appropriate and cooperative throughout examination HEENT: Normocephalic and atraumatic without obvious abnormalities. Ears, externally no deformities PULM: Breathing comfortably in no respiratory distress EXT: No clubbing, cyanosis, or edema PSYCH: Normally interactive.  Cooperative during the interview. Pleasant. Friendly and conversant. Not anxious or depressed appearing. Normal, full affect.  Shoulder: R Inspection: No muscle wasting or winging Ecchymosis/edema: neg  AC joint, scapula, clavicle: AC mild TTP Cervical spine: NT, full ROM Spurling's: neg Abduction: 4/5, to 100 Flexion: 5/5, to 100 IR, full, lift-off: 5/5, minimal ER at neutral: 5/5, 20 deg AC crossover and compression: neg Special testing equivocal Speed's: neg Yergason's: neg Sulcus sign: neg Scapular dyskinesis: notable elevation of R hemiscapula Neg drop test C5-T1 intact Sensation intact Grip 5/5  A/P: Diagnosis: adhesive capsulitis Shoulder pain  Tylenol or NSAID of choice prn for pain relief HEP for frozen shoulder  F/u 6 weeks  We discussed how given her exam and history, I cannot fully exclude a RTC tear. The patient has no insurance, so she would not like to pursue any imaging or further work-up.  Review of Systems     Objective:   Physical Exam        Assessment & Plan:

## 2011-05-10 ENCOUNTER — Ambulatory Visit: Payer: PRIVATE HEALTH INSURANCE

## 2011-06-09 ENCOUNTER — Other Ambulatory Visit: Payer: Self-pay | Admitting: Family Medicine

## 2011-09-17 ENCOUNTER — Ambulatory Visit: Payer: Self-pay | Admitting: Family Medicine

## 2011-09-18 ENCOUNTER — Encounter: Payer: Self-pay | Admitting: Family Medicine

## 2011-09-18 ENCOUNTER — Ambulatory Visit (INDEPENDENT_AMBULATORY_CARE_PROVIDER_SITE_OTHER): Payer: Self-pay | Admitting: Family Medicine

## 2011-09-18 VITALS — BP 120/78 | HR 93 | Temp 98.1°F | Ht 66.0 in | Wt 214.4 lb

## 2011-09-18 DIAGNOSIS — M7500 Adhesive capsulitis of unspecified shoulder: Secondary | ICD-10-CM

## 2011-09-18 DIAGNOSIS — E119 Type 2 diabetes mellitus without complications: Secondary | ICD-10-CM

## 2011-09-18 DIAGNOSIS — Z79899 Other long term (current) drug therapy: Secondary | ICD-10-CM

## 2011-09-18 DIAGNOSIS — E785 Hyperlipidemia, unspecified: Secondary | ICD-10-CM

## 2011-09-18 DIAGNOSIS — M7501 Adhesive capsulitis of right shoulder: Secondary | ICD-10-CM | POA: Insufficient documentation

## 2011-09-18 LAB — HEPATIC FUNCTION PANEL
Alkaline Phosphatase: 101 U/L (ref 39–117)
Bilirubin, Direct: 0.1 mg/dL (ref 0.0–0.3)

## 2011-09-18 LAB — HEMOGLOBIN A1C: Hgb A1c MFr Bld: 9.9 % — ABNORMAL HIGH (ref 4.6–6.5)

## 2011-09-18 LAB — LDL CHOLESTEROL, DIRECT: Direct LDL: 83.6 mg/dL

## 2011-09-18 LAB — MICROALBUMIN / CREATININE URINE RATIO: Microalb Creat Ratio: 2.4 mg/g (ref 0.0–30.0)

## 2011-09-18 LAB — BASIC METABOLIC PANEL
Chloride: 102 mEq/L (ref 96–112)
Potassium: 3.9 mEq/L (ref 3.5–5.1)

## 2011-09-18 MED ORDER — GLIPIZIDE 10 MG PO TABS: 10.0000 mg | ORAL_TABLET | Freq: Two times a day (BID) | ORAL | Status: DC

## 2011-09-18 NOTE — Progress Notes (Addendum)
  Patient Name: Lindsey Mason Date of Birth: 1970/07/20 Age: 41 y.o. Medical Record Number: [id number] Gender: female Date of Encounter: 09/18/2011  History of Present Illness:  Lindsey Mason is a 41 y.o. very pleasant female patient who presents with the following:  Feels like her BS is higher.  Urinating a lot Out of test strips  Diabetes Mellitus: Tolerating Medications: yes Compliance with diet: fair Exercise: minimal Avg blood sugars at home: not checking Foot problems: none Hypoglycemia: none No nausea, vomitting, blurred vision + polyuria.  Lab Results  Component Value Date   HGBA1C 8.1* 08/20/2010    Wt Readings from Last 3 Encounters:  09/18/11 214 lb 6.4 oz (97.251 kg)  04/30/11 215 lb 1.9 oz (97.578 kg)  12/20/10 211 lb 12.8 oz (96.072 kg)    Body mass index is 34.60 kg/(m^2).  Lipids: Doing well, stable. Tolerating meds fine with no SE. Panel reviewed with patient.  Lipids:    Component Value Date/Time   LDLDIRECT 89.0 08/20/2010 1140   Adhesive capsulitis, improving, doing HEP for ROM  Tried to pitch with R shoulder -- did OK.     Past Medical History, Surgical History, Social History, Family History, Problem List, Medications, and Allergies have been reviewed and updated if relevant.  Review of Systems:  GEN: No acute illnesses, no fevers, chills. GI: No n/v/d, eating normally Pulm: No SOB Interactive and getting along well at home.  Otherwise, ROS is as per the HPI.   Physical Examination: Filed Vitals:   09/18/11 0931  BP: 120/78  Pulse: 93  Temp: 98.1 F (36.7 C)  TempSrc: Oral  Height: 5\' 6"  (1.676 m)  Weight: 214 lb 6.4 oz (97.251 kg)  SpO2: 99%    Body mass index is 34.60 kg/(m^2).   GEN: WDWN, NAD, Non-toxic, A & O x 3 HEENT: Atraumatic, Normocephalic. Neck supple. No masses, No LAD. Ears and Nose: No external deformity. CV: RRR, No M/G/R. No JVD. No thrill. No extra heart sounds. PULM: CTA B, no wheezes, crackles,  rhonchi. No retractions. No resp. distress. No accessory muscle use. EXTR: No c/c/e NEURO Normal gait. MSK: ROM improving, str 4+/5 abd, all other 5/5. Loss of 25 deg of abduction  PSYCH: Normally interactive. Conversant. Not depressed or anxious appearing.  Calm demeanor.    Assessment and Plan: 1. DIABETES MELLITUS, TYPE II  Basic metabolic panel, Hemoglobin A1c, Microalbumin / creatinine urine ratio  2. HYPERLIPIDEMIA  LDL cholesterol, direct  3. Encounter for long-term (current) use of other medications  Hepatic function panel  4. Adhesive capsulitis of right shoulder      Check all labs, may need titration of meds up for DM  Addendum:  Lab Results  Component Value Date   HGBA1C 9.9* 09/18/2011   Basic Metabolic Panel:    Component Value Date/Time   NA 137 09/18/2011 0956   K 3.9 09/18/2011 0956   CL 102 09/18/2011 0956   CO2 25 09/18/2011 0956   BUN 9 09/18/2011 0956   CREATININE 0.5 09/18/2011 0956   GLUCOSE 372* 09/18/2011 0956   CALCIUM 8.9 09/18/2011 0956   BS with poor control. No insurance - difficulty with limited options. Discussed with her pharamacist. Cheapest option may be novolog 70/30 at 100 a month.  For now, I will increase her Metformin to 500 mg x 3 in the AM and 500 mg x 2 in the PM And increase glipizide to 10 mg, 2 tabs po bid

## 2011-10-01 MED ORDER — METFORMIN HCL 500 MG PO TABS: ORAL_TABLET | ORAL | Status: DC

## 2011-10-01 MED ORDER — GLIPIZIDE 10 MG PO TABS: 20.0000 mg | ORAL_TABLET | Freq: Two times a day (BID) | ORAL | Status: DC

## 2011-10-01 NOTE — Progress Notes (Signed)
Addended by: [name] on: 10/01/2011 09:31 AM   Modules accepted: Orders

## 2011-12-31 ENCOUNTER — Other Ambulatory Visit: Payer: Self-pay | Admitting: Family Medicine

## 2012-02-26 ENCOUNTER — Telehealth: Payer: Self-pay | Admitting: *Deleted

## 2012-02-26 NOTE — Telephone Encounter (Signed)
done

## 2012-02-26 NOTE — Telephone Encounter (Signed)
Form for diabetic testing supplies in your IN box 

## 2012-03-04 ENCOUNTER — Ambulatory Visit (INDEPENDENT_AMBULATORY_CARE_PROVIDER_SITE_OTHER): Payer: Managed Care, Other (non HMO) | Admitting: Family Medicine

## 2012-03-04 ENCOUNTER — Encounter: Payer: Self-pay | Admitting: Internal Medicine

## 2012-03-04 ENCOUNTER — Encounter: Payer: Self-pay | Admitting: Family Medicine

## 2012-03-04 VITALS — BP 120/76 | HR 86 | Temp 98.6°F | Resp 16 | Wt 206.0 lb

## 2012-03-04 DIAGNOSIS — IMO0001 Reserved for inherently not codable concepts without codable children: Secondary | ICD-10-CM

## 2012-03-04 DIAGNOSIS — Z79899 Other long term (current) drug therapy: Secondary | ICD-10-CM

## 2012-03-04 DIAGNOSIS — E119 Type 2 diabetes mellitus without complications: Secondary | ICD-10-CM

## 2012-03-04 DIAGNOSIS — F329 Major depressive disorder, single episode, unspecified: Secondary | ICD-10-CM

## 2012-03-04 DIAGNOSIS — E1165 Type 2 diabetes mellitus with hyperglycemia: Secondary | ICD-10-CM

## 2012-03-04 LAB — LIPID PANEL
Cholesterol: 137 mg/dL (ref 0–200)
LDL Cholesterol: 72 mg/dL (ref 0–99)
Triglycerides: 119 mg/dL (ref 0.0–149.0)
VLDL: 23.8 mg/dL (ref 0.0–40.0)

## 2012-03-04 LAB — HEPATIC FUNCTION PANEL
Albumin: 3.8 g/dL (ref 3.5–5.2)
Alkaline Phosphatase: 84 U/L (ref 39–117)
Total Protein: 7.2 g/dL (ref 6.0–8.3)

## 2012-03-04 LAB — BASIC METABOLIC PANEL
CO2: 27 mEq/L (ref 19–32)
Chloride: 102 mEq/L (ref 96–112)
Sodium: 134 mEq/L — ABNORMAL LOW (ref 135–145)

## 2012-03-04 MED ORDER — SERTRALINE HCL 100 MG PO TABS: 100.0000 mg | ORAL_TABLET | Freq: Every day | ORAL | Status: DC

## 2012-03-04 MED ORDER — INSULIN GLARGINE 100 UNIT/ML ~~LOC~~ SOLN: SUBCUTANEOUS | Status: DC

## 2012-03-04 NOTE — Progress Notes (Signed)
Nature conservation officer at [facility] [address] [place] [address] Phone: [phone] Fax: [phone]  Date:  03/04/2012   Name:  Lindsey Mason   DOB:  03/06/1971   MRN:  [id number] Gender: female Age: 40 y.o.  PCP:  [name], MD    Chief Complaint: Diabetes   History of Present Illness:  Lindsey Mason is a 41 y.o. pleasant patient who presents with the following:  DM: occ blurred vision, 10 pound weight loss, urinating all the time and feeling thirsty. Compliant with all meds 300-390 when eating.   Wt Readings from Last 3 Encounters:  03/04/12 206 lb (93.441 kg)  09/18/11 214 lb 6.4 oz (97.251 kg)  04/30/11 215 lb 1.9 oz (97.578 kg)   Depression: not doing all that well, lashing out, crying sometimes, arguing with her kids and spouse  Patient Active Problem List  Diagnosis  . Poorly controlled type 2 diabetes mellitus  . HYPERLIPIDEMIA  . GERD  . ACNE ROSACEA  . HEADACHE, CHRONIC  . CHICKENPOX, HX OF  . DEPRESSIVE DISORDER  . Adhesive capsulitis of right shoulder    Past Medical History  Diagnosis Date  . Headache   . Esophageal reflux   . Diabetes mellitus without mention of complication   . Pure hypercholesterolemia   . Rosacea     Past Surgical History  Procedure Date  . Cesarean section     triplets and singles  . Appendectomy   . Tubal ligation     History  Substance Use Topics  . Smoking status: Former Smoker    Types: Cigarettes    Quit date: 03/04/2004  . Smokeless tobacco: Never Used  . Alcohol Use: Yes    Family History  Problem Relation Age of Onset  . Cancer Mother     breast    No Known Allergies  Medication list has been reviewed and updated.  Current Outpatient Prescriptions on File Prior to Visit  Medication Sig Dispense Refill  . glipiZIDE (GLUCOTROL) 10 MG tablet Take 2 tablets (20 mg total) by mouth 2 (two) times daily before a meal.  360 tablet  3  . metFORMIN (GLUCOPHAGE) 500 MG tablet 3 tabs  po before breakfast and 2 tabs po before evening meal  450 tablet  2  . metronidazole (NORITATE) 1 % cream Apply topically daily.  60 g  prn  . sertraline (ZOLOFT) 50 MG tablet TAKE ONE TABLET BY MOUTH ONE TIME DAILY  90 tablet  2  . traMADol (ULTRAM) 50 MG tablet TAKE ONE TABLET BY MOUTH EVERY 6 HOURS AS NEEDED FOR PAIN *MAX OF 8TABLETS PER DAY*  50 tablet  3    Review of Systems:   GEN: No acute illnesses, no fevers, chills. GI: eating normally Pulm: No SOB Interactive and getting along well at home.  Otherwise, ROS is as per the HPI.   Physical Examination: Filed Vitals:   03/04/12 0820  BP: 120/76  Pulse: 86  Temp: 98.6 F (37 C)  Resp: 16   Filed Vitals:   03/04/12 0820  Weight: 206 lb (93.441 kg)   There is no height on file to calculate BMI. Ideal Body Weight:     GEN: WDWN, NAD, Non-toxic, A & O x 3 HEENT: Atraumatic, Normocephalic. Neck supple. No masses, No LAD. Ears and Nose: No external deformity. CV: RRR, No M/G/R. No JVD. No thrill. No extra heart sounds. PULM: CTA B, no wheezes, crackles, rhonchi. No retractions. No resp. distress. No  accessory muscle use. EXTR: No c/c/e NEURO Normal gait.  PSYCH: Normally interactive. Conversant. Not depressed or anxious appearing.  Calm demeanor.    Assessment and Plan:  1. Diabetes type 2, uncontrolled  Basic metabolic panel, Hemoglobin A1c, Lipid panel  2. Encounter for long-term (current) use of other medications  Hepatic function panel  3. Poorly controlled type 2 diabetes mellitus    4. DEPRESSIVE DISORDER      Check labs, start lantus and titrate up Inc zoloft  Recheck 3 weeks  Orders Today:  Orders Placed This Encounter  Procedures  . Basic metabolic panel  . Hemoglobin A1c  . Lipid panel  . Hepatic function panel    Medications Today: (Includes new updates added during medication reconciliation) Meds ordered this encounter  Medications  . insulin glargine (LANTUS) 100 UNIT/ML injection     Sig: Start 10 units qhs, then increase by 1 unit daily until FBS < 120    Dispense:  10 mL    Refill:  1  . sertraline (ZOLOFT) 100 MG tablet    Sig: Take 1 tablet (100 mg total) by mouth daily.    Dispense:  90 tablet    Refill:  2    Medications Discontinued: Medications Discontinued During This Encounter  Medication Reason  . sertraline (ZOLOFT) 50 MG tablet Reorder  . glipiZIDE (GLUCOTROL) 10 MG tablet      [name], MD,

## 2012-03-04 NOTE — Patient Instructions (Signed)
Lantus: start at 10 units of insulin at night. Increase by 1 unit each night until Fasting blood sugar is less than 120.   F/u Dr. Patsy Lager 4-6 weeks

## 2012-04-02 ENCOUNTER — Encounter: Payer: Self-pay | Admitting: Family Medicine

## 2012-04-02 ENCOUNTER — Ambulatory Visit (INDEPENDENT_AMBULATORY_CARE_PROVIDER_SITE_OTHER): Payer: Managed Care, Other (non HMO) | Admitting: Family Medicine

## 2012-04-02 VITALS — BP 120/80 | HR 72 | Temp 98.4°F | Wt 206.0 lb

## 2012-04-02 DIAGNOSIS — E119 Type 2 diabetes mellitus without complications: Secondary | ICD-10-CM

## 2012-04-02 DIAGNOSIS — F3289 Other specified depressive episodes: Secondary | ICD-10-CM

## 2012-04-02 DIAGNOSIS — F329 Major depressive disorder, single episode, unspecified: Secondary | ICD-10-CM

## 2012-04-02 DIAGNOSIS — E1165 Type 2 diabetes mellitus with hyperglycemia: Secondary | ICD-10-CM

## 2012-04-02 MED ORDER — "INSULIN SYRINGE 31G X 5/16"" 1 ML MISC": Status: DC

## 2012-04-02 MED ORDER — INSULIN GLARGINE 100 UNIT/ML ~~LOC~~ SOLN: SUBCUTANEOUS | Status: DC

## 2012-04-02 MED ORDER — SERTRALINE HCL 50 MG PO TABS: 50.0000 mg | ORAL_TABLET | Freq: Every day | ORAL | Status: DC

## 2012-04-02 NOTE — Patient Instructions (Addendum)
F/u 6-8 weeks

## 2012-04-02 NOTE — Progress Notes (Signed)
Nature conservation officer at [facility] [address] [place] [address] Phone: [phone] Fax: [phone]  Date:  04/02/2012   Name:  Lindsey Mason   DOB:  [birth year]   MRN:  [id number] Gender: female Age: 41 y.o.  PCP:  [name], MD    Chief Complaint: Follow-up   History of Present Illness:  Lindsey Mason is a 41 y.o. pleasant patient who presents with the following:  Diabetes Mellitus: Tolerating Medications: yes Compliance with diet: fairly good Exercise: none now Avg blood sugars at home: 200+ in am, often over 300 2 hours post-prandial Foot problems: none Hypoglycemia: none No nausea, vomitting, blurred vision, polyuria.  Lab Results  Component Value Date   HGBA1C 10.7* 03/04/2012    Wt Readings from Last 3 Encounters:  04/02/12 206 lb (93.441 kg)  03/04/12 206 lb (93.441 kg)  09/18/11 214 lb 6.4 oz (97.251 kg)    There is no height on file to calculate BMI.   Up to 50 units -- up 50 units of insulin and > 200, close to 300 2 hours  Zoloft at 50 mg now, sometimes will have some moments and will cry.  Still having some separation, husband, was using again.   New job at [employer]. 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   Depression is relatively stable. Her husband is started using drugs again, and this is upsetting to her. We did increase her Zoloft to 100 mg on her last office visit, but she was unable tolerate his, so this was decreased to 50 mg on her own.  Patient Active Problem List  Diagnosis  . Poorly controlled type 2 diabetes mellitus  . HYPERLIPIDEMIA  . GERD  . ACNE ROSACEA  . HEADACHE, CHRONIC  . CHICKENPOX, HX OF  . DEPRESSIVE DISORDER  . Adhesive capsulitis of right shoulder    Past Medical History  Diagnosis Date  . Headache   . Esophageal reflux   . Diabetes mellitus without mention of complication   . Pure hypercholesterolemia   . Rosacea     Past Surgical History  Procedure  Date  . Cesarean section     triplets and singles  . Appendectomy   . Tubal ligation     History  Substance Use Topics  . Smoking status: Former Smoker    Types: Cigarettes    Quit date: 03/04/2004  . Smokeless tobacco: Never Used  . Alcohol Use: Yes    Family History  Problem Relation Age of Onset  . Cancer Mother     breast    No Known Allergies  Medication list has been reviewed and updated.  Outpatient Prescriptions Prior to Visit  Medication Sig Dispense Refill  . insulin glargine (LANTUS) 100 UNIT/ML injection Start 10 units qhs, then increase by 1 unit daily until FBS < 120  10 mL  1  . metFORMIN (GLUCOPHAGE) 500 MG tablet 3 tabs po before breakfast and 2 tabs po before evening meal  450 tablet  2  . metronidazole (NORITATE) 1 % cream Apply topically daily.  60 g  prn  . sertraline (ZOLOFT) 100 MG tablet Take 1 tablet (100 mg total) by mouth daily.  90 tablet  2  . traMADol (ULTRAM) 50 MG tablet TAKE ONE TABLET BY MOUTH EVERY 6 HOURS AS NEEDED FOR PAIN *MAX OF 8TABLETS PER DAY*  50 tablet  3    Review of Systems:   GEN: No  acute illnesses, no fevers, chills. GI: No n/v/d, eating normally Pulm: No SOB Interactive and getting along well at home.  Otherwise, ROS is as per the HPI.   Physical Examination: Filed Vitals:   04/02/12 0939  BP: 120/80  Pulse: 72  Temp: 98.4 F (36.9 C)   Filed Vitals:   04/02/12 0939  Weight: 206 lb (93.441 kg)   There is no height on file to calculate BMI. Ideal Body Weight:     GEN: WDWN, NAD, Non-toxic, A & O x 3 HEENT: Atraumatic, Normocephalic. Neck supple. No masses, No LAD. Ears and Nose: No external deformity. CV: RRR, No M/G/R. No JVD. No thrill. No extra heart sounds. PULM: CTA B, no wheezes, crackles, rhonchi. No retractions. No resp. distress. No accessory muscle use. EXTR: No c/c/e NEURO Normal gait.  PSYCH: Normally interactive. Conversant. Not depressed or anxious appearing.  Calm demeanor.     Assessment and Plan:  1. Poorly controlled type 2 diabetes mellitus   2. DEPRESSIVE DISORDER    Increase Lantus. May go up to 55 units today. If blood sugar still greater than 150, controlled to 60 units tomorrow. At that point, again onset of raising Lantus dosing by 2 units daily.  At this point, think a lot of her depression is situational, recommended some exercise, continue with her music, and maintain Zoloft 50 mg  Orders Today:  No orders of the defined types were placed in this encounter.    Updated Medication List: (Includes new medications, updates to list, dose adjustments) Meds ordered this encounter  Medications  . Insulin Syringe-Needle U-100 (INSULIN SYRINGE 1CC/31GX5/16") 31G X 5/16" 1 ML MISC    Sig: Use as directed for Lantus dosing    Dispense:  100 each    Refill:  3    Medications Discontinued: There are no discontinued medications.   [name], MD

## 2012-04-10 ENCOUNTER — Ambulatory Visit (INDEPENDENT_AMBULATORY_CARE_PROVIDER_SITE_OTHER): Payer: Managed Care, Other (non HMO) | Admitting: Internal Medicine

## 2012-04-10 ENCOUNTER — Encounter: Payer: Self-pay | Admitting: Internal Medicine

## 2012-04-10 VITALS — BP 98/70 | HR 72 | Temp 98.2°F | Wt 207.0 lb

## 2012-04-10 DIAGNOSIS — R51 Headache: Secondary | ICD-10-CM

## 2012-04-10 DIAGNOSIS — Z2089 Contact with and (suspected) exposure to other communicable diseases: Secondary | ICD-10-CM

## 2012-04-10 DIAGNOSIS — Z20818 Contact with and (suspected) exposure to other bacterial communicable diseases: Secondary | ICD-10-CM

## 2012-04-10 MED ORDER — PENICILLIN V POTASSIUM 500 MG PO TABS: 1000.0000 mg | ORAL_TABLET | Freq: Two times a day (BID) | ORAL | Status: DC

## 2012-04-10 NOTE — Addendum Note (Signed)
Addended by: Eliezer Bottom on: 04/10/2012 11:48 AM   Modules accepted: Orders

## 2012-04-10 NOTE — Progress Notes (Signed)
  Subjective:    Patient ID: Lindsey Mason, female    DOB: [birth year], 41 y.o.   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 insulin glargine (LANTUS) 100 UNIT/ML injection 50 units nightly  10 mL  1  . Insulin Syringe-Needle U-100 (INSULIN SYRINGE 1CC/31GX5/16") 31G X 5/16" 1 ML MISC Use as directed for Lantus dosing  100 each  3  . metFORMIN (GLUCOPHAGE) 500 MG tablet 3 tabs po before breakfast and 2 tabs po before evening meal  450 tablet  2  . metronidazole (NORITATE) 1 % cream Apply topically daily.  60 g  prn  . sertraline (ZOLOFT) 50 MG tablet Take 1 tablet (50 mg total) by mouth daily.  90 tablet  2  . traMADol (ULTRAM) 50 MG tablet TAKE ONE TABLET BY MOUTH EVERY 6 HOURS AS NEEDED FOR PAIN *MAX OF 8TABLETS PER DAY*  50 tablet  3    No Known Allergies  Past Medical History  Diagnosis Date  . Headache   . Esophageal reflux   . Diabetes mellitus without mention of complication   . Pure hypercholesterolemia   . Rosacea     Past Surgical History  Procedure Date  . Cesarean section     triplets and singles  . Appendectomy   . Tubal ligation     Family History  Problem Relation Age of Onset  . Cancer Mother     breast    History   Social History  . Marital Status: Married    Spouse Name: N/A    Number of Children: N/A  . Years of Education: N/A   Occupational History  . music minister    Social History Main Topics  . Smoking status: Former Smoker    Types: Cigarettes    Quit  date: 03/04/2004  . Smokeless tobacco: Never Used  . Alcohol Use: Yes  . Drug Use: No  . Sexually Active: Not on file   Other Topics Concern  . Not on file   Social History Narrative  . No narrative on file   Review of Systems No rahs No abdominal pain Some nausea and frequent loose stools Appetite is down    Objective:   Physical Exam  Constitutional: She appears well-developed and well-nourished. No distress.  HENT:  Nose: Nose normal.  Mouth/Throat: No oropharyngeal exudate.       No sinus tenderness Slight pharyngeal injection? But no exudate TMs normal   Neck: Normal range of motion. Neck supple. No thyromegaly present.       ? Slight submental nodes bilaterally  Pulmonary/Chest: Effort normal and breath sounds normal. No respiratory distress. She has no wheezes. She has no rales.  Lymphadenopathy:    She has cervical adenopathy.          Assessment & Plan:

## 2012-04-10 NOTE — Assessment & Plan Note (Signed)
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

## 2012-04-10 NOTE — Patient Instructions (Signed)
Please try loratadine 10mg  1-2 daily or cetirizine 10mg  daily to see if that helps

## 2012-04-10 NOTE — Addendum Note (Signed)
Addended by: Tillman Abide I on: 04/10/2012 11:31 AM   Modules accepted: Orders

## 2012-04-16 ENCOUNTER — Telehealth: Payer: Self-pay | Admitting: Family Medicine

## 2012-04-16 NOTE — Telephone Encounter (Signed)
Caller: [name]/Patient; Patient Name: Lindsey Mason; PCP: [name] [facility]); Best Callback Phone Number: [phone] Seen in office on 04/09/12 and dx with Strep after having headaches and diarrhea for past 4-5 weeks. Taking Pennicillin - 2 days left.  Still having headaches and swollen tender Lymph Node on L side of neck. Afebrile. Triage and Care advice per Sore Throat Protocol and appointment advised within 24 hours for "...painful, swollen glands on sides of neck or under jaw." Appointment scheduled for 04/17/12 at 0930 with Dr. Ermalene Searing.

## 2012-04-17 ENCOUNTER — Encounter: Payer: Self-pay | Admitting: Family Medicine

## 2012-04-17 ENCOUNTER — Ambulatory Visit (INDEPENDENT_AMBULATORY_CARE_PROVIDER_SITE_OTHER): Payer: Managed Care, Other (non HMO) | Admitting: Family Medicine

## 2012-04-17 VITALS — BP 110/80 | HR 76 | Temp 98.0°F | Wt 210.0 lb

## 2012-04-17 DIAGNOSIS — R51 Headache: Secondary | ICD-10-CM

## 2012-04-17 DIAGNOSIS — J020 Streptococcal pharyngitis: Secondary | ICD-10-CM | POA: Insufficient documentation

## 2012-04-17 DIAGNOSIS — R519 Headache, unspecified: Secondary | ICD-10-CM

## 2012-04-17 MED ORDER — DICLOFENAC SODIUM 75 MG PO TBEC: 75.0000 mg | DELAYED_RELEASE_TABLET | Freq: Two times a day (BID) | ORAL | Status: DC

## 2012-04-17 NOTE — Assessment & Plan Note (Signed)
Resolving on PCN. Complete course.

## 2012-04-17 NOTE — Patient Instructions (Addendum)
Stop  Ibuprofen.  Trial of diclofenac for headache . Call if not effective.  Keep follow up with PCP in early [place]. Complete course of antibiotics for strep throat.  Call if new neurologic symptoms, fever.

## 2012-04-17 NOTE — Assessment & Plan Note (Addendum)
Temporally associated with titrating up insulin... ? If SE to lantus or more likely sensitivity to no more normal blood sugars ( CBGs used to be 359!) Normal neuro exam. No suggestion of mass lesion.  Does not appear associated with strep as strep symptoms are improving with PCN. No symptoms of allergy and no improvement with  Antihistamine daily. I encouraged pt to continue lantus. Push fluids.  We will treat headache with diclofenac or if this does not work tramadol (made her sleepy in past).  Also may be migraine,as she had menstrual migraine when she was younger. Keep follow up with PCP in early November.

## 2012-04-17 NOTE — Progress Notes (Signed)
Subjective:    Patient ID: Lindsey Mason, female    DOB: 1970-12-08, 41 y.o.   MRN: [id number]  HPI  41 year old female seen by Dr. Alphonsus Sias on 10/11 for headhache and sore throat.  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  Will try antihistamine.   Initial POC strep was thought to be negative but then came positive.Mora Appl on PCN x 10 days.  Today she reports that she has had no improvement in  headache in last week despite antibiotics.  Her main complaint is headache.. It is constant now in last 1-2 week. First started 1 month ago. Pain is over frontal head. More pressure than throbbing.  No phonophobia, mild photophonia. Mild nausea , no vomiting. Occ mild dizziness. Feels tightness in ears. No congestion.  Tried 400 mg ibuprofen every 6 hours... Mild relief.  She is not longer having sore throat and diarrhea.  Has history of migraine with menses in her 47s... Had stopped years ago after having her kids.  New med: Lantus  70 units given DM was poorly controlled, started 6 weeks ago. Blood sugars 175 fasting.        Review of Systems  Constitutional: Negative for fever and fatigue.  HENT: Negative for ear pain.   Eyes: Negative for pain.  Respiratory: Negative for chest tightness and shortness of breath.   Cardiovascular: Negative for chest pain, palpitations and leg swelling.  Gastrointestinal: Negative for abdominal pain.  Genitourinary: Negative for dysuria.       Objective:   Physical Exam  Constitutional: She is oriented to person, place, and time. Vital signs are normal. She appears well-developed and well-nourished. She is cooperative.  Non-toxic appearance. She does not appear ill. No distress.  HENT:  Head: Normocephalic.  Right Ear: Hearing,  tympanic membrane, external ear and ear canal normal. Tympanic membrane is not erythematous, not retracted and not bulging.  Left Ear: Hearing, tympanic membrane, external ear and ear canal normal. Tympanic membrane is not erythematous, not retracted and not bulging.  Nose: No mucosal edema or rhinorrhea. Right sinus exhibits no maxillary sinus tenderness and no frontal sinus tenderness. Left sinus exhibits no maxillary sinus tenderness and no frontal sinus tenderness.  Mouth/Throat: Uvula is midline, oropharynx is clear and moist and mucous membranes are normal.  Eyes: Conjunctivae normal, EOM and lids are normal. Pupils are equal, round, and reactive to light. No foreign bodies found.  Neck: Trachea normal and normal range of motion. Neck supple. Carotid bruit is not present. No mass and no thyromegaly present.  Cardiovascular: Normal rate, regular rhythm, S1 normal, S2 normal, normal heart sounds, intact distal pulses and normal pulses.  Exam reveals no gallop and no friction rub.   No murmur heard. Pulmonary/Chest: Effort normal and breath sounds normal. Not tachypneic. No respiratory distress. She has no decreased breath sounds. She has no wheezes. She has no rhonchi. She has no rales.  Abdominal: Soft. Normal appearance and bowel sounds are normal. There is no tenderness.  Neurological: She is alert and oriented to person, place, and time. She has normal strength and normal reflexes. No cranial nerve deficit or sensory deficit. She displays a negative Romberg sign. Coordination and gait normal.  Skin: Skin is warm, dry and intact. No rash noted.  Psychiatric: Her speech is normal  and behavior is normal. Judgment and thought content normal. Her mood appears not anxious. Cognition and memory are normal. She does not exhibit a depressed mood.          Assessment & Plan:

## 2012-04-24 ENCOUNTER — Other Ambulatory Visit: Payer: Self-pay

## 2012-04-24 ENCOUNTER — Other Ambulatory Visit: Payer: Self-pay | Admitting: Family Medicine

## 2012-04-24 MED ORDER — INSULIN GLARGINE 100 UNIT/ML ~~LOC~~ SOLN: SUBCUTANEOUS | Status: DC

## 2012-04-24 NOTE — Telephone Encounter (Signed)
Pt left v/m requesting status of Lantus refill. Notified pt was sent to [facility]. Pt said she has picked up med and will call for refill couple of days in advance next time before she runs out of med.

## 2012-04-24 NOTE — Telephone Encounter (Signed)
rx sent to pharmacy by e-script  

## 2012-05-13 ENCOUNTER — Other Ambulatory Visit: Payer: Self-pay | Admitting: Family Medicine

## 2012-05-13 ENCOUNTER — Ambulatory Visit
Admission: RE | Admit: 2012-05-13 | Discharge: 2012-05-13 | Disposition: A | Discharge status: Home / Self Care | Payer: Managed Care, Other (non HMO) | Source: Ambulatory Visit | Attending: Family Medicine | Admitting: Family Medicine

## 2012-05-13 DIAGNOSIS — Z1231 Encounter for screening mammogram for malignant neoplasm of breast: Secondary | ICD-10-CM

## 2012-05-14 ENCOUNTER — Ambulatory Visit (INDEPENDENT_AMBULATORY_CARE_PROVIDER_SITE_OTHER): Payer: Managed Care, Other (non HMO) | Admitting: Family Medicine

## 2012-05-14 ENCOUNTER — Encounter: Payer: Self-pay | Admitting: Family Medicine

## 2012-05-14 VITALS — BP 120/78 | HR 82 | Temp 98.2°F | Ht 66.0 in | Wt 207.0 lb

## 2012-05-14 DIAGNOSIS — N61 Mastitis without abscess: Secondary | ICD-10-CM

## 2012-05-14 DIAGNOSIS — R234 Changes in skin texture: Secondary | ICD-10-CM

## 2012-05-14 DIAGNOSIS — Z1239 Encounter for other screening for malignant neoplasm of breast: Secondary | ICD-10-CM

## 2012-05-14 DIAGNOSIS — E1065 Type 1 diabetes mellitus with hyperglycemia: Secondary | ICD-10-CM

## 2012-05-14 DIAGNOSIS — IMO0001 Reserved for inherently not codable concepts without codable children: Secondary | ICD-10-CM

## 2012-05-14 MED ORDER — DICLOFENAC SODIUM 75 MG PO TBEC: 75.0000 mg | DELAYED_RELEASE_TABLET | Freq: Two times a day (BID) | ORAL | Status: DC

## 2012-05-14 MED ORDER — CEPHALEXIN 500 MG PO CAPS: 1000.0000 mg | ORAL_CAPSULE | Freq: Two times a day (BID) | ORAL | Status: DC

## 2012-05-14 NOTE — Patient Instructions (Addendum)
REFERRAL: GO THE THE FRONT ROOM AT THE ENTRANCE OF OUR CLINIC, NEAR CHECK IN. ASK FOR Lindsey Mason. SHE WILL HELP YOU SET UP YOUR REFERRAL. DATE: TIME:  

## 2012-05-14 NOTE — Progress Notes (Signed)
Nature conservation officer at [facility] [address] [place] [address] Phone: [phone] Fax: [phone]  Date:  05/14/2012   Name:  Lindsey Mason   DOB:  10-07-1970   MRN:  [id number] Gender: female Age: 41 y.o.  PCP:  [name], MD  Evaluating MD: [name], MD   Chief Complaint: Follow-up and Diabetes   History of Present Illness:  Lindsey Mason is a 41 y.o. pleasant patient who presents with the following:  F/u DM: 245 this morning, yesterday was around  After eating, will still be 240-50.  Diabetes Mellitus: Tolerating Medications: yes Compliance with diet: fairly good, with some cheating Exercise: rare Avg blood sugars at home: 240-250 ----- now on lantus 80 units of insulin and metformin 2500 mg daily Foot problems: none Hypoglycemia: none No nausea, vomitting, blurred vision, polyuria.  Lab Results  Component Value Date   HGBA1C 10.7* 03/04/2012    Wt Readings from Last 3 Encounters:  05/14/12 207 lb (93.895 kg)  04/17/12 210 lb (95.255 kg)  04/10/12 207 lb (93.895 kg)    Body mass index is 33.41 kg/(m^2).   Went mammogram, has a raw place on her breast --- wants get a diagnostic mammo, raw place on R breast.  Somewhat warm, drained a little a couple of days ago.  Mood is more stable, things going ok with family  Patient Active Problem List  Diagnosis  . Poorly controlled type 2 diabetes mellitus  . HYPERLIPIDEMIA  . GERD  . ACNE ROSACEA  . Acute headache  . CHICKENPOX, HX OF  . DEPRESSIVE DISORDER  . Adhesive capsulitis of right shoulder  . Strep pharyngitis    Past Medical History  Diagnosis Date  . Headache   . Esophageal reflux   . Diabetes mellitus without mention of complication   . Pure hypercholesterolemia   . Rosacea     Past Surgical History  Procedure Date  . Cesarean section     triplets and singles  . Appendectomy   . Tubal ligation     History  Substance Use Topics  . Smoking status: Former  Smoker    Types: Cigarettes    Quit date: 03/04/2004  . Smokeless tobacco: Never Used  . Alcohol Use: Yes    Family History  Problem Relation Age of Onset  . Cancer Mother     breast    No Known Allergies  Medication list has been reviewed and updated.  Outpatient Prescriptions Prior to Visit  Medication Sig Dispense Refill  . diclofenac (VOLTAREN) 75 MG EC tablet Take 1 tablet (75 mg total) by mouth 2 (two) times daily.  30 tablet  0  . insulin glargine (LANTUS) 100 UNIT/ML injection 50 units nightly  2 vial  1  . Insulin Syringe-Needle U-100 (INSULIN SYRINGE 1CC/31GX5/16") 31G X 5/16" 1 ML MISC Use as directed for Lantus dosing  100 each  3  . metFORMIN (GLUCOPHAGE) 500 MG tablet 3 tabs po before breakfast and 2 tabs po before evening meal  450 tablet  2  . sertraline (ZOLOFT) 50 MG tablet Take 1 tablet (50 mg total) by mouth daily.  90 tablet  2  . penicillin v potassium (VEETID) 500 MG tablet Take 2 tablets (1,000 mg total) by mouth 2 (two) times daily.  40 tablet  0  . traMADol (ULTRAM) 50 MG tablet TAKE ONE TABLET BY MOUTH EVERY 6 HOURS AS NEEDED FOR PAIN *MAX OF 8TABLETS PER DAY*  50 tablet  3  Last reviewed on 05/14/2012 11:15 AM by Consuello Masse, CMA  Review of Systems:  ROS: GEN: Acute illness details above GI: Tolerating PO intake GU: maintaining adequate hydration and urination Pulm: No SOB Interactive and getting along well at home.  Otherwise, ROS is as per the HPI.   Physical Examination: Filed Vitals:   05/14/12 1113  BP: 120/78  Pulse: 82  Temp: 98.2 F (36.8 C)  TempSrc: Oral  Height: 5\' 6"  (1.676 m)  Weight: 207 lb (93.895 kg)  SpO2: 98%    Body mass index is 33.41 kg/(m^2). Ideal Body Weight: Weight in (lb) to have BMI = 25: 154.6    GEN: WDWN, NAD, Non-toxic, A & O x 3 HEENT: Atraumatic, Normocephalic. Neck supple. No masses, No LAD. Ears and Nose: No external deformity. CV: RRR, No M/G/R. No JVD. No thrill. No extra heart  sounds. Breast: R inferior breast with area 1/2 dollar sized, redness, some surrounding warmth PULM: CTA B, no wheezes, crackles, rhonchi. No retractions. No resp. distress. No accessory muscle use. EXTR: No c/c/e NEURO Normal gait.  PSYCH: Normally interactive. Conversant. Not depressed or anxious appearing.  Calm demeanor.    Assessment and Plan:  1. Diabetes mellitus, insulin dependent (IDDM), uncontrolled  Ambulatory referral to Endocrinology  2. Encounter for breast cancer screening other than mammogram  MM Digital Diagnostic Bilat, MM Digital Diagnostic Bilat  3. Breast skin changes  MM Digital Diagnostic Bilat  4. Cellulitis and abscess of breast     DM has been challenging and difficult to get under good control -- already on Lantus 80 units, will get endocrine involved.  Breast changes, concern for cellulitis, cannot rule out skin change associated with altered breast pathology - diag mammo  Orders Today:  Orders Placed This Encounter  Procedures  . MM Digital Diagnostic Bilat    Standing Status: Future     Number of Occurrences:      Standing Expiration Date: 07/14/2013    EPIC ORDER   PF BCG 11-11  NO NEEDS CS/MARIAN     Order Specific Question:  Reason for exam:    Answer:  R breast, exterior skin change    Order Specific Question:  Is the patient pregnant?    Answer:  No    Order Specific Question:  Preferred imaging location?    Answer:  [facility]  . MM Digital Diagnostic Bilat    Standing Status: Future     Number of Occurrences:      Standing Expiration Date: 07/14/2013    Order Specific Question:  Reason for exam:    Answer:  diag mammo, skin changes on R breast - breast center requested diag not classic mammo    Order Specific Question:  Is the patient pregnant?    Answer:  No    Order Specific Question:  Preferred imaging location?    Answer:  [facility]  . Ambulatory referral to Endocrinology    Referral Priority:  Routine    Referral Type:   Consultation    Referral Reason:  Specialty Services Required    Requested Specialty:  Endocrinology    Number of Visits Requested:  1    Updated Medication List: (Includes new medications, updates to list, dose adjustments) Meds ordered this encounter  Medications  . diclofenac (VOLTAREN) 75 MG EC tablet    Sig: Take 1 tablet (75 mg total) by mouth 2 (two) times daily.    Dispense:  60 tablet    Refill:  5  .  cephALEXin (KEFLEX) 500 MG capsule    Sig: Take 2 capsules (1,000 mg total) by mouth 2 (two) times daily.    Dispense:  40 capsule    Refill:  0    Medications Discontinued: Medications Discontinued During This Encounter  Medication Reason  . penicillin v potassium (VEETID) 500 MG tablet   . traMADol (ULTRAM) 50 MG tablet   . diclofenac (VOLTAREN) 75 MG EC tablet Reorder     [name], MD

## 2012-05-19 ENCOUNTER — Other Ambulatory Visit (INDEPENDENT_AMBULATORY_CARE_PROVIDER_SITE_OTHER): Payer: Managed Care, Other (non HMO)

## 2012-05-19 DIAGNOSIS — Z79899 Other long term (current) drug therapy: Secondary | ICD-10-CM

## 2012-05-19 DIAGNOSIS — E7800 Pure hypercholesterolemia, unspecified: Secondary | ICD-10-CM

## 2012-05-19 DIAGNOSIS — R5381 Other malaise: Secondary | ICD-10-CM

## 2012-05-19 DIAGNOSIS — E119 Type 2 diabetes mellitus without complications: Secondary | ICD-10-CM

## 2012-05-19 LAB — CBC WITH DIFFERENTIAL/PLATELET
Basophils Relative: 0.6 % (ref 0.0–3.0)
Eosinophils Relative: 2.2 % (ref 0.0–5.0)
HCT: 42.3 % (ref 36.0–46.0)
Lymphs Abs: 2.6 10*3/uL (ref 0.7–4.0)
MCV: 86.5 fl (ref 78.0–100.0)
Monocytes Absolute: 0.5 10*3/uL (ref 0.1–1.0)
Neutro Abs: 7.4 10*3/uL (ref 1.4–7.7)
Platelets: 270 10*3/uL (ref 150.0–400.0)
WBC: 10.8 10*3/uL — ABNORMAL HIGH (ref 4.5–10.5)

## 2012-05-19 LAB — BASIC METABOLIC PANEL
BUN: 15 mg/dL (ref 6–23)
Calcium: 9 mg/dL (ref 8.4–10.5)
Creatinine, Ser: 0.5 mg/dL (ref 0.4–1.2)
GFR: 140.4 mL/min (ref >60.00)
Glucose, Bld: 178 mg/dL — ABNORMAL HIGH (ref 70–99)
Sodium: 137 mEq/L (ref 135–145)

## 2012-05-21 ENCOUNTER — Other Ambulatory Visit: Payer: Self-pay | Admitting: Family Medicine

## 2012-05-21 ENCOUNTER — Ambulatory Visit
Admission: RE | Admit: 2012-05-21 | Discharge: 2012-05-21 | Disposition: A | Discharge status: Home / Self Care | Payer: Managed Care, Other (non HMO) | Source: Ambulatory Visit | Attending: Family Medicine | Admitting: Family Medicine

## 2012-05-21 DIAGNOSIS — Z1239 Encounter for other screening for malignant neoplasm of breast: Secondary | ICD-10-CM

## 2012-05-21 DIAGNOSIS — R921 Mammographic calcification found on diagnostic imaging of breast: Secondary | ICD-10-CM

## 2012-05-26 ENCOUNTER — Encounter: Payer: Self-pay | Admitting: Family Medicine

## 2012-05-27 ENCOUNTER — Ambulatory Visit (INDEPENDENT_AMBULATORY_CARE_PROVIDER_SITE_OTHER): Payer: Managed Care, Other (non HMO) | Admitting: Family Medicine

## 2012-05-27 ENCOUNTER — Other Ambulatory Visit (HOSPITAL_COMMUNITY)
Admission: RE | Admit: 2012-05-27 | Discharge: 2012-05-27 | Disposition: A | Discharge status: Home / Self Care | Payer: Self-pay | Source: Ambulatory Visit | Attending: Family Medicine | Admitting: Family Medicine

## 2012-05-27 ENCOUNTER — Encounter: Payer: Self-pay | Admitting: Family Medicine

## 2012-05-27 VITALS — BP 120/60 | HR 90 | Temp 98.2°F | Ht 66.0 in | Wt 210.5 lb

## 2012-05-27 DIAGNOSIS — Z01419 Encounter for gynecological examination (general) (routine) without abnormal findings: Secondary | ICD-10-CM

## 2012-05-27 DIAGNOSIS — E282 Polycystic ovarian syndrome: Secondary | ICD-10-CM

## 2012-05-27 DIAGNOSIS — Z23 Encounter for immunization: Secondary | ICD-10-CM

## 2012-05-27 DIAGNOSIS — Z0000 Encounter for general adult medical examination without abnormal findings: Secondary | ICD-10-CM

## 2012-05-27 HISTORY — DX: Polycystic ovarian syndrome: E28.2

## 2012-05-27 MED ORDER — MEDROXYPROGESTERONE ACETATE 5 MG PO TABS: ORAL_TABLET | ORAL | Status: DC

## 2012-05-27 NOTE — Progress Notes (Signed)
Nature conservation officer at [facility] [address] [place] [address] Phone: [phone] Fax: [phone]  Date:  05/27/2012   Name:  Lindsey Mason   DOB:  07/07/1970   MRN:  [id number] Gender: female Age: 41 y.o.  PCP:  [name], MD  Evaluating MD: [name], MD   Chief Complaint: Annual Exam   History of Present Illness:  Lindsey Mason is a 41 y.o. pleasant patient who presents with the following:  CPX:  DM: 178 this morning 110 on Saturday, then the next day it was 245. 80 units on lantus nightly now Taking metformin  Had some headaches on Saturday, took some voltaren and then took a tramadol.   Periods are heavy and painful now with a lot of clotting.   Also will have a lot of back pain from her cycle.  Does have PCOS as well.   Health Maintenance Summary Reviewed and updated, unless pt declines services.  Tobacco History Reviewed. Non-smoker Alcohol: No concerns, no excessive use Exercise Habits: minimal activity STD concerns: none Drug Use: None Menses regular: yes Lumps or breast concerns: R UPPER BREAST, LAST WEEK CONCERN ON MAMMOGRAM AND ULTRASOUND, BIOPSY IS PENDING  Health Maintenance  Topic Date Due  . Foot Exam  04/11/2010  . Urine Microalbumin  09/17/2012  . Hemoglobin A1c  11/16/2012  . Ophthalmology Exam  11/24/2012  . Influenza Vaccine  03/01/2013  . Pneumococcal Polysaccharide Vaccine (#2) 04/11/2014  . Pap Smear  05/28/2015  . Tetanus/tdap  05/27/2022    Labs reviewed with the patient.  Results for orders placed in visit on 05/19/12  HEMOGLOBIN A1C      Component Value Range   Hemoglobin A1C 10.4 (*) 4.6 - 6.5 %  TSH      Component Value Range   TSH 0.91  0.35 - 5.50 uIU/mL  CBC WITH DIFFERENTIAL      Component Value Range   WBC 10.8 (*) 4.5 - 10.5 K/uL   RBC 4.90  3.87 - 5.11 Mil/uL   Hemoglobin 13.9  12.0 - 15.0 g/dL   HCT 86.5  78.4 - 69.6 %   MCV 86.5  78.0 - 100.0 fl   MCHC 32.8  30.0 - 36.0 g/dL   RDW 29.5  28.4 - 13.2 %   Platelets 270.0  150.0 - 400.0 K/uL   Neutrophils Relative 68.2  43.0 - 77.0 %   Lymphocytes Relative 24.1  12.0 - 46.0 %   Monocytes Relative 4.9  3.0 - 12.0 %   Eosinophils Relative 2.2  0.0 - 5.0 %   Basophils Relative 0.6  0.0 - 3.0 %   Neutro Abs 7.4  1.4 - 7.7 K/uL   Lymphs Abs 2.6  0.7 - 4.0 K/uL   Monocytes Absolute 0.5  0.1 - 1.0 K/uL   Eosinophils Absolute 0.2  0.0 - 0.7 K/uL   Basophils Absolute 0.1  0.0 - 0.1 K/uL  BASIC METABOLIC PANEL      Component Value Range   Sodium 137  135 - 145 mEq/L   Potassium 4.1  3.5 - 5.1 mEq/L   Chloride 101  96 - 112 mEq/L   CO2 30  19 - 32 mEq/L   Glucose, Bld 178 (*) 70 - 99 mg/dL   BUN 15  6 - 23 mg/dL   Creatinine, Ser 0.5  0.4 - 1.2 mg/dL   Calcium 9.0  8.4 - 44.0 mg/dL   GFR 102.72  >53.66 mL/min  Patient Active Problem List  Diagnosis  . Poorly controlled type 2 diabetes mellitus  . HYPERLIPIDEMIA  . GERD  . ACNE ROSACEA  . DEPRESSIVE DISORDER  . Adhesive capsulitis of right shoulder    Past Medical History  Diagnosis Date  . Headache   . Esophageal reflux   . Diabetes mellitus without mention of complication   . Pure hypercholesterolemia   . Rosacea     Past Surgical History  Procedure Date  . Cesarean section     triplets and singles  . Appendectomy   . Tubal ligation     History  Substance Use Topics  . Smoking status: Former Smoker    Types: Cigarettes    Quit date: 03/04/2004  . Smokeless tobacco: Never Used  . Alcohol Use: Yes    Family History  Problem Relation Age of Onset  . Cancer Mother     breast    No Known Allergies  Medication list has been reviewed and updated.  Outpatient Prescriptions Prior to Visit  Medication Sig Dispense Refill  . cephALEXin (KEFLEX) 500 MG capsule Take 2 capsules (1,000 mg total) by mouth 2 (two) times daily.  40 capsule  0  . diclofenac (VOLTAREN) 75 MG EC tablet Take 1 tablet (75 mg total) by mouth 2 (two) times daily.  60  tablet  5  . insulin glargine (LANTUS) 100 UNIT/ML injection 50 units nightly  2 vial  1  . Insulin Syringe-Needle U-100 (INSULIN SYRINGE 1CC/31GX5/16") 31G X 5/16" 1 ML MISC Use as directed for Lantus dosing  100 each  3  . metFORMIN (GLUCOPHAGE) 500 MG tablet 3 tabs po before breakfast and 2 tabs po before evening meal  450 tablet  2  . sertraline (ZOLOFT) 50 MG tablet Take 1 tablet (50 mg total) by mouth daily.  90 tablet  2   Last reviewed on 05/27/2012  9:17 AM by Consuello Masse, CMA  Review of Systems:   General: Denies fever, chills, sweats. No significant weight loss. Eyes: Denies blurring,significant itching ENT: Denies earache, sore throat, and hoarseness.  Cardiovascular: Denies chest pains, palpitations, dyspnea on exertion,  Respiratory: Denies cough, dyspnea at rest,wheeezing Breast: AS ABOVE GI: Denies nausea, vomiting, diarrhea, constipation, change in bowel habits, abdominal pain, melena, hematochezia GU: Denies dysuria, hematuria, urinary hesitancy, nocturia, denies STD risk, no concerns about discharge GYN: INCREASED MENSTRUAL FLOW Musculoskeletal: Denies back pain, CONT R SHOULDER PAIN, BUT BETTER, FROZEN SHOULDER Derm: Denies rash, itching Neuro: Denies  paresthesias, frequent falls, frequent headaches Psych: Denies depression, anxiety Endocrine: Denies cold intolerance, heat intolerance, polydipsia Heme: Denies enlarged lymph nodes Allergy: No hayfever   Physical Examination: Filed Vitals:   05/27/12 0917  BP: 120/60  Pulse: 90  Temp: 98.2 F (36.8 C)  TempSrc: Oral  Height: 5\' 6"  (1.676 m)  Weight: 210 lb 8 oz (95.482 kg)  SpO2: 96%    Body mass index is 33.98 kg/(m^2). Ideal Body Weight: Weight in (lb) to have BMI = 25: 154.6    Wt Readings from Last 3 Encounters:  05/27/12 210 lb 8 oz (95.482 kg)  05/14/12 207 lb (93.895 kg)  04/17/12 210 lb (95.255 kg)    GEN: well developed, well nourished, no acute distress Eyes: conjunctiva and lids  normal, PERRLA, EOMI ENT: TM clear, nares clear, oral exam WNL Neck: supple, no lymphadenopathy, no thyromegaly, no JVD Pulm: clear to auscultation and percussion, respiratory effort normal CV: regular rate and rhythm, S1-S2, no murmur, rub or gallop, no bruits  Chest: no scars, masses, no lumps BREAST: INFERIOR SKIN CHANGE IMPROVING MUCH, DIFFUSE FIBROCYSTIC CHANGES, I DON'T REALLY APPRECIATE A FOCAL CHANGE IN AREA OF QUESTION GI: soft, non-tender; no hepatosplenomegaly, masses; active bowel sounds all quadrants GU: Normal external female genitalia. Cervix appears intact without lesions or irritation. Vaginal canal normal without ulceration or lesion. Cervix NT to exam. Ovaries neither enlarged nor tender. Lymph: no cervical, axillary or inguinal adenopathy MSK: gait normal, muscle tone and strength WNL, no joint swelling, effusions, discoloration, crepitus  SKIN: clear, good turgor, color WNL, no rashes, lesions, or ulcerations Neuro: normal mental status, normal strength, sensation, and motion Psych: alert; oriented to person, place and time, normally interactive and not anxious or depressed in appearance.   Assessment and Plan:  1. Routine general medical examination at a health care facility    2. Routine gynecological examination  Cytology - PAP  3. Need for Tdap vaccination  Tdap vaccine greater than or equal to 7yo IM  4. Need for prophylactic vaccination and inoculation against influenza  Flu vaccine greater than or equal to 3yo preservative free IM  5. PCOS (polycystic ovarian syndrome)     The patient's preventative maintenance and recommended screening tests for an annual wellness exam were reviewed in full today. Brought up to date unless services declined.  Counselled on the importance of diet, exercise, and its role in overall health and mortality. The patient's FH and SH was reviewed, including their home life, tobacco status, and drug and alcohol status.   Increased  menstrual flow, will do a trial of provera x 2 months, 1 week at a time.  Orders Today:  Orders Placed This Encounter  Procedures  . Flu vaccine greater than or equal to 3yo preservative free IM  . Tdap vaccine greater than or equal to 7yo IM    Updated Medication List: (Includes new medications, updates to list, dose adjustments) Meds ordered this encounter  Medications  . medroxyPROGESTERone (PROVERA) 5 MG tablet    Sig: 1 po daily x 1 week (start day 16 of cycle), then repeat following cycle x 1 daily po for 1 week    Dispense:  14 tablet    Refill:  0    Medications Discontinued: There are no discontinued medications.   [name], MD

## 2012-06-03 ENCOUNTER — Encounter: Payer: Self-pay | Admitting: *Deleted

## 2012-06-04 ENCOUNTER — Ambulatory Visit
Admission: RE | Admit: 2012-06-04 | Discharge: 2012-06-04 | Disposition: A | Discharge status: Home / Self Care | Payer: Commercial Indemnity | Source: Ambulatory Visit | Attending: Family Medicine | Admitting: Family Medicine

## 2012-06-04 ENCOUNTER — Other Ambulatory Visit: Payer: Self-pay | Admitting: Family Medicine

## 2012-06-04 DIAGNOSIS — R921 Mammographic calcification found on diagnostic imaging of breast: Secondary | ICD-10-CM

## 2012-07-04 ENCOUNTER — Encounter (HOSPITAL_COMMUNITY): Payer: Self-pay | Admitting: Nurse Practitioner

## 2012-07-04 ENCOUNTER — Emergency Department (HOSPITAL_COMMUNITY)
Admission: EM | Admit: 2012-07-04 | Discharge: 2012-07-04 | Disposition: A | Discharge status: Home / Self Care | Payer: Commercial Indemnity | Attending: Emergency Medicine | Admitting: Emergency Medicine

## 2012-07-04 DIAGNOSIS — Z794 Long term (current) use of insulin: Secondary | ICD-10-CM | POA: Insufficient documentation

## 2012-07-04 DIAGNOSIS — E119 Type 2 diabetes mellitus without complications: Secondary | ICD-10-CM | POA: Insufficient documentation

## 2012-07-04 DIAGNOSIS — K219 Gastro-esophageal reflux disease without esophagitis: Secondary | ICD-10-CM | POA: Insufficient documentation

## 2012-07-04 DIAGNOSIS — Z79899 Other long term (current) drug therapy: Secondary | ICD-10-CM | POA: Insufficient documentation

## 2012-07-04 DIAGNOSIS — L0291 Cutaneous abscess, unspecified: Secondary | ICD-10-CM

## 2012-07-04 DIAGNOSIS — L03319 Cellulitis of trunk, unspecified: Secondary | ICD-10-CM | POA: Insufficient documentation

## 2012-07-04 DIAGNOSIS — Z8742 Personal history of other diseases of the female genital tract: Secondary | ICD-10-CM | POA: Insufficient documentation

## 2012-07-04 DIAGNOSIS — Z87891 Personal history of nicotine dependence: Secondary | ICD-10-CM | POA: Insufficient documentation

## 2012-07-04 DIAGNOSIS — E7800 Pure hypercholesterolemia, unspecified: Secondary | ICD-10-CM | POA: Insufficient documentation

## 2012-07-04 DIAGNOSIS — L02219 Cutaneous abscess of trunk, unspecified: Secondary | ICD-10-CM | POA: Insufficient documentation

## 2012-07-04 LAB — CBC WITH DIFFERENTIAL/PLATELET
Basophils Absolute: 0 10*3/uL (ref 0.0–0.1)
MCH: 28.8 pg (ref 26.0–34.0)
MCHC: 33.9 g/dL (ref 30.0–36.0)
MCV: 85 fL (ref 78.0–100.0)
Monocytes Absolute: 0.6 10*3/uL (ref 0.1–1.0)
Neutrophils Relative %: 69 % (ref 43–77)
Platelets: 255 10*3/uL (ref 150–400)

## 2012-07-04 LAB — POCT I-STAT, CHEM 8
BUN: 9 mg/dL (ref 6–23)
Calcium, Ion: 1.13 mmol/L (ref 1.12–1.23)
Chloride: 102 mEq/L (ref 96–112)

## 2012-07-04 MED ORDER — SODIUM CHLORIDE 0.9 % IV BOLUS (SEPSIS)
1000.0000 mL | Freq: Once | INTRAVENOUS | Status: AC
  Administered 2012-07-04: 1000 mL via INTRAVENOUS

## 2012-07-04 MED ORDER — HYDROCODONE-ACETAMINOPHEN 5-325 MG PO TABS: 1.0000 | ORAL_TABLET | Freq: Four times a day (QID) | ORAL | Status: DC | PRN

## 2012-07-04 MED ORDER — VANCOMYCIN HCL IN DEXTROSE 1-5 GM/200ML-% IV SOLN
1000.0000 mg | Freq: Once | INTRAVENOUS | Status: AC
  Administered 2012-07-04: 1000 mg via INTRAVENOUS
  Filled 2012-07-04: qty 200

## 2012-07-04 MED ORDER — CEPHALEXIN 500 MG PO CAPS: 500.0000 mg | ORAL_CAPSULE | Freq: Four times a day (QID) | ORAL | Status: DC

## 2012-07-04 MED ORDER — ACETAMINOPHEN 325 MG PO TABS
650.0000 mg | ORAL_TABLET | Freq: Once | ORAL | Status: AC
  Administered 2012-07-04: 650 mg via ORAL
  Filled 2012-07-04: qty 2

## 2012-07-04 MED ORDER — SULFAMETHOXAZOLE-TRIMETHOPRIM 800-160 MG PO TABS: 1.0000 | ORAL_TABLET | Freq: Two times a day (BID) | ORAL | Status: DC

## 2012-07-04 NOTE — ED Notes (Signed)
Pt reports swollen, painful area to navel that has been draining a yellowish foul smelling fluid x 2 days. Also unable to get her blood sugar below 300.

## 2012-07-04 NOTE — ED Provider Notes (Signed)
History     CSN: [id number]  Arrival date & time 07/04/12  1319   First MD Initiated Contact with Patient 07/04/12 1437      Chief Complaint  Patient presents with  . Wound Infection    (Consider location/radiation/quality/duration/timing/severity/associated sxs/prior treatment) Patient is a 42 y.o. female presenting with abscess. The history is provided by the patient.  Abscess  This is a new problem. Episode onset: 2 days ago. The onset was gradual. The problem occurs continuously. The problem has been gradually worsening. The abscess is present on the abdomen (Around her umbilicus). The problem is moderate. The abscess is characterized by redness, painfulness, swelling and draining (Prior to being bedridden the emergency department she started to have foul-smelling drainage coming from her umbilicus). It is unknown what she was exposed to. Pertinent negatives include no anorexia, no fever, no diarrhea and no vomiting. Associated symptoms comments: Blood sugars have been running high. Her past medical history is significant for skin abscesses in family. There were no sick contacts. She has received no recent medical care.    Past Medical History  Diagnosis Date  . Headache   . Esophageal reflux   . Diabetes mellitus without mention of complication   . Pure hypercholesterolemia   . Rosacea   . PCOS (polycystic ovarian syndrome) 05/27/2012    Past Surgical History  Procedure Date  . Cesarean section     triplets and singles  . Appendectomy   . Tubal ligation     Family History  Problem Relation Age of Onset  . Cancer Mother     breast    History  Substance Use Topics  . Smoking status: Former Smoker    Types: Cigarettes    Quit date: 03/04/2004  . Smokeless tobacco: Never Used  . Alcohol Use: Yes    OB History    Grav Para Term Preterm Abortions TAB SAB Ect Mult Living                  Review of Systems  Constitutional: Negative for fever.    Gastrointestinal: Negative for vomiting, diarrhea and anorexia.  All other systems reviewed and are negative.    Allergies  Review of patient's allergies indicates no known allergies.  Home Medications   Current Outpatient Rx  Name  Route  Sig  Dispense  Refill  . DICLOFENAC SODIUM 75 MG PO TBEC   Oral   Take 75 mg by mouth 2 (two) times daily as needed. For pain         . INSULIN GLARGINE 100 UNIT/ML [place] SOLN   Subcutaneous   Inject 50 Units into the skin 2 (two) times daily.          [name] METFORMIN HCL 500 MG PO TABS   Oral   Take 1,000 mg by mouth 2 (two) times daily with a meal.         . SERTRALINE HCL 50 MG PO TABS   Oral   Take 1 tablet (50 mg total) by mouth daily.   90 tablet   2   . INSULIN SYRINGE 31G X 5/16" 1 ML MISC      Use as directed for Lantus dosing   100 each   3     BP 138/84  Pulse 104  Temp 98.7 F (37.1 C) (Oral)  Resp 16  SpO2 96%  Physical Exam  Nursing note and vitals reviewed. Constitutional: She is oriented to person, place, and time. She appears well-developed  and well-nourished. No distress.  HENT:  Head: Normocephalic and atraumatic.  Mouth/Throat: Oropharynx is clear and moist.  Eyes: Conjunctivae normal and EOM are normal. Pupils are equal, round, and reactive to light.  Neck: Normal range of motion. Neck supple.  Cardiovascular: Regular rhythm and intact distal pulses.  Tachycardia present.   No murmur heard. Pulmonary/Chest: Effort normal and breath sounds normal. No respiratory distress. She has no wheezes. She has no rales.  Abdominal: Soft. She exhibits no distension. There is no tenderness. There is no rebound and no guarding.    Musculoskeletal: Normal range of motion. She exhibits no edema and no tenderness.  Neurological: She is alert and oriented to person, place, and time.  Skin: Skin is warm and dry. No rash noted. No erythema.  Psychiatric: She has a normal mood and affect. Her behavior is normal.     ED Course  Procedures (including critical care time)  Labs Reviewed  GLUCOSE, CAPILLARY - Abnormal; Notable for the following:    Glucose-Capillary 364 (*)     All other components within normal limits  CBC WITH DIFFERENTIAL - Abnormal; Notable for the following:    WBC 10.7 (*)     RBC 5.41 (*)     Hemoglobin 15.6 (*)     All other components within normal limits  POCT I-STAT, CHEM 8 - Abnormal; Notable for the following:    Glucose, Bld 310 (*)     Hemoglobin 16.3 (*)     HCT 48.0 (*)     All other components within normal limits   No results found.   1. Cellulitis and abscess       MDM   Patient with pus and foul smelling discharge coming from her umbilicus with surrounding cellulitis. Patient has no prior history of abscesses or problems with her umbilicus. She has no fluctuance or induration consistent with abscess. When probing with a Q-tip there is no sign of tracking. Patient is diabetic but denies systemic symptoms. She was given one dose of IV vancomycin and placed on Bactrim and Keflex.   She will pack her bellybutton with packing twice a day do to the location of where the drainage is coming from cannot visualize the bottom.        Gwyneth Sprout, MD 07/04/12 1625

## 2012-12-08 ENCOUNTER — Encounter: Payer: Self-pay | Admitting: Family Medicine

## 2012-12-08 ENCOUNTER — Ambulatory Visit (INDEPENDENT_AMBULATORY_CARE_PROVIDER_SITE_OTHER): Payer: Commercial Indemnity | Admitting: Family Medicine

## 2012-12-08 VITALS — BP 120/60 | HR 97 | Temp 98.6°F | Ht 66.0 in | Wt 208.0 lb

## 2012-12-08 DIAGNOSIS — L821 Other seborrheic keratosis: Secondary | ICD-10-CM

## 2012-12-08 NOTE — Progress Notes (Signed)
Nature conservation officer at [facility] [address] [place] [address] Phone: [phone] Fax: [phone]  Date:  12/08/2012   Name:  Lindsey Mason   DOB:  1971-03-05   MRN:  [id number] Gender: female Age: 42 y.o.  Primary Physician:  [name], MD  Evaluating MD: [name], MD   Chief Complaint: spot on left shoulder   History of Present Illness:  Lindsey Mason is a 42 y.o. pleasant patient who presents with the following:  Spot on left shoulder. Raised and dry and brown. Also has another on the L shoulder.   Patient Active Problem List   Diagnosis Date Noted  . Poorly controlled type 2 diabetes mellitus 03/08/2009    Priority: High  . PCOS (polycystic ovarian syndrome) 05/27/2012  . 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 DEPRESSIVE DISORDER 08/20/2010  . HYPERLIPIDEMIA 04/11/2009  . 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 GERD 03/08/2009    Past Medical History  Diagnosis Date  . Headache(784.0)   . Esophageal reflux   . Diabetes mellitus without mention of complication   . Pure hypercholesterolemia   . Rosacea   . PCOS (polycystic ovarian syndrome) 05/27/2012    Past Surgical History  Procedure Laterality Date  . Cesarean section      triplets and singles  . Appendectomy    . Tubal ligation      History   Social History  . Marital Status: Married    Spouse Name: N/A    Number of Children: N/A  . Years of Education: N/A   Occupational History  . music minister    Social History Main Topics  . Smoking status: Former Smoker    Types: Cigarettes    Quit date: 03/04/2004  . Smokeless tobacco: Never Used  . Alcohol Use: Yes  . Drug Use: No  . Sexually Active: Not on file   Other Topics Concern  . Not on file   Social History Narrative  . No narrative on file    Family History  Problem Relation Age of Onset  . Cancer Mother     breast    No Known Allergies  Medication list has been reviewed and  updated.  Outpatient Prescriptions Prior to Visit  Medication Sig Dispense Refill  . diclofenac (VOLTAREN) 75 MG EC tablet Take 75 mg by mouth 2 (two) times daily as needed. For pain      . insulin glargine (LANTUS) 100 UNIT/ML injection Inject 50 Units into the skin 2 (two) times daily.       . Insulin Syringe-Needle U-100 (INSULIN SYRINGE 1CC/31GX5/16") 31G X 5/16" 1 ML MISC Use as directed for Lantus dosing  100 each  3  . metFORMIN (GLUCOPHAGE) 500 MG tablet Take 1,000 mg by mouth 2 (two) times daily with a meal.      . cephALEXin (KEFLEX) 500 MG capsule Take 1 capsule (500 mg total) by mouth 4 (four) times daily.  40 capsule  0  . HYDROcodone-acetaminophen (NORCO/VICODIN) 5-325 MG per tablet Take 1 tablet by mouth every 6 (six) hours as needed for pain.  10 tablet  0  . sertraline (ZOLOFT) 50 MG tablet Take 1 tablet (50 mg total) by mouth daily.  90 tablet  2  . sulfamethoxazole-trimethoprim (SEPTRA DS) 800-160 MG per tablet Take 1 tablet by mouth every 12 (twelve) hours.  10 tablet  0   No facility-administered medications prior to visit.    Review of Systems:   GEN: No acute  illnesses, no fevers, chills. GI: No n/v/d, eating normally Pulm: No SOB Interactive and getting along well at home.  Otherwise, ROS is as per the HPI.   Physical Examination: BP 120/60  Pulse 97  Temp(Src) 98.6 F (37 C) (Oral)  Ht 5\' 6"  (1.676 m)  Wt 208 lb (94.348 kg)  BMI 33.59 kg/m2  SpO2 99%  Ideal Body Weight: Weight in (lb) to have BMI = 25: 154.6   GEN: WDWN, NAD, Non-toxic, Alert & Oriented x 3 HEENT: Atraumatic, Normocephalic.  Ears and Nose: No external deformity. EXTR: No clubbing/cyanosis/edema NEURO: Normal gait.  PSYCH: Normally interactive. Conversant. Not depressed or anxious appearing.  Calm demeanor.  SKIN: small, elevated, dry appearing brown lesion on L and R shoulder  Assessment and Plan:  Seborrheic keratoses  Obvious seb k. Reassured.  Orders Today:  No orders  of the defined types were placed in this encounter.    Updated Medication List: (Includes new medications, updates to list, dose adjustments) No orders of the defined types were placed in this encounter.    Medications Discontinued: Medications Discontinued During This Encounter  Medication Reason  . cephALEXin (KEFLEX) 500 MG capsule Error  . HYDROcodone-acetaminophen (NORCO/VICODIN) 5-325 MG per tablet Error  . sulfamethoxazole-trimethoprim (SEPTRA DS) 800-160 MG per tablet Error  . sertraline (ZOLOFT) 50 MG tablet Error      Signed, Gita Dilger T. Rechy Bost, MD 12/08/2012 10:37 AM

## 2012-12-08 NOTE — Patient Instructions (Addendum)
F/u 2 months - for diabetes

## 2013-01-21 ENCOUNTER — Telehealth: Payer: Self-pay

## 2013-01-21 NOTE — Telephone Encounter (Signed)
Pt has reoccuring yeast infection for last 5 weeks. Vaginal discharge and itching, cottage cheese looking. Pt used monistat OTC which helped for short period but symptoms back again. Pt scheduled 01/22/13 at 9:45 with Dr Ermalene Searing.

## 2013-01-22 ENCOUNTER — Ambulatory Visit (INDEPENDENT_AMBULATORY_CARE_PROVIDER_SITE_OTHER): Payer: Commercial Indemnity | Admitting: Family Medicine

## 2013-01-22 ENCOUNTER — Encounter: Payer: Self-pay | Admitting: Family Medicine

## 2013-01-22 VITALS — BP 124/80 | HR 96 | Temp 98.3°F | Ht 66.0 in | Wt 201.5 lb

## 2013-01-22 DIAGNOSIS — B373 Candidiasis of vulva and vagina: Secondary | ICD-10-CM

## 2013-01-22 LAB — POCT WET PREP WITH KOH
KOH Prep POC: POSITIVE
Trichomonas, UA: NEGATIVE
Yeast Wet Prep HPF POC: POSITIVE

## 2013-01-22 MED ORDER — FLUCONAZOLE 150 MG PO TABS: 150.0000 mg | ORAL_TABLET | Freq: Once | ORAL | Status: DC

## 2013-01-22 NOTE — Progress Notes (Signed)
  Subjective:    Patient ID: Lindsey Mason, female    DOB: 12-24-1970, 42 y.o.   MRN: [id number]  Vaginal Discharge The patient's primary symptoms include genital itching, a genital rash and a vaginal discharge. The patient's pertinent negatives include no genital lesions, genital odor, missed menses, pelvic pain or vaginal bleeding. This is a new problem. The current episode started more than 1 month ago. The problem occurs constantly. The problem has been gradually worsening. The patient is experiencing no pain. The problem affects both sides. She is not pregnant. Pertinent negatives include no abdominal pain, anorexia, chills, diarrhea, dysuria, fever, frequency, headaches, hematuria, nausea or sore throat. The symptoms are aggravated by activity. She has tried antifungals for the symptoms. The treatment provided significant relief. She is not sexually active. No, her partner does not have an STD. (Similar issue in past summers)   She has tried both 3 day and 7 day monistat.. Improved initially then returned each time.  Lab Results  Component Value Date   HGBA1C 10.4* 05/19/2012     Review of Systems  Constitutional: Negative for fever and chills.  HENT: Negative for sore throat.   Gastrointestinal: Negative for nausea, abdominal pain, diarrhea and anorexia.  Genitourinary: Positive for vaginal discharge. Negative for dysuria, frequency, hematuria, pelvic pain and missed menses.  Neurological: Negative for headaches.       Objective:   Physical Exam  Constitutional: She appears well-developed and well-nourished.  Cardiovascular: Normal rate.   Pulmonary/Chest: Effort normal.  Abdominal: Soft. Bowel sounds are normal. There is no tenderness.  Genitourinary: There is rash on the right labia. There is no tenderness or lesion on the right labia. There is rash on the left labia. There is no tenderness or lesion on the left labia. There is erythema around the vagina. No tenderness around  the vagina. Vaginal discharge found.          Assessment & Plan:

## 2013-01-22 NOTE — Assessment & Plan Note (Signed)
Treat with diflucan. Improve DM control to avoid yeast issues.

## 2013-01-22 NOTE — Patient Instructions (Signed)
Take fluconazole, if not improvement repeat once.

## 2013-02-03 ENCOUNTER — Other Ambulatory Visit: Payer: Self-pay

## 2013-02-15 ENCOUNTER — Ambulatory Visit: Payer: Self-pay | Admitting: Family Medicine

## 2013-03-19 ENCOUNTER — Telehealth: Payer: Self-pay

## 2013-03-19 MED ORDER — METFORMIN HCL 500 MG PO TABS: 1000.0000 mg | ORAL_TABLET | Freq: Two times a day (BID) | ORAL | Status: DC

## 2013-03-19 MED ORDER — "PEN NEEDLES 5/16"" 31G X 8 MM MISC": Status: DC

## 2013-03-19 MED ORDER — INSULIN GLARGINE 100 UNIT/ML ~~LOC~~ SOLN: 50.0000 [IU] | Freq: Two times a day (BID) | SUBCUTANEOUS | Status: DC

## 2013-03-19 MED ORDER — INSULIN GLARGINE 100 UNIT/ML SOLOSTAR PEN: 50.0000 [IU] | PEN_INJECTOR | Freq: Two times a day (BID) | SUBCUTANEOUS | Status: DC

## 2013-03-19 NOTE — Addendum Note (Signed)
Addended by: Damita Lack on: 03/19/2013 04:34 PM   Modules accepted: Orders

## 2013-03-19 NOTE — Telephone Encounter (Signed)
Spoke with [employer].  Advised we could give her some samples of the Lantus Solostar.  She will come by around 2 pm to pick up samples. Pen needles sent in to her pharmacy.  Patient is very grateful.

## 2013-03-19 NOTE — Telephone Encounter (Signed)
Pt left v/m; pt does not have insurance now and cost of lantus to pt is $700.00. Pt hopes to have insurance 05/01/13.pt wants to know if any samples available or can another med be substituted until 05/01/13. Pt request [email or web address]. Pt is out of medication.

## 2013-03-19 NOTE — Telephone Encounter (Signed)
Can you check and see if we have Lantus samples? I am pretty sure we do. Hopefully we have enough to bridge her until insurance comes into effect.  If we do not can you see if someone knows how to contact the drug rep so we could get more? Jacki Cones or [place] probably would know. The insulin [place].  Thanks!  Discussed all with her.   [name], MD 03/19/2013, 11:12 AM

## 2013-04-08 NOTE — Telephone Encounter (Signed)
Janyra notified samples of Lantus Solostar are available.  Will stop by office in morning to pick up samples. Latus SoloStar 100 units/ml 3 ml pen x 2.  Lot: [id number] Exp: 08-2015

## 2013-04-08 NOTE — Telephone Encounter (Signed)
Pt left v/m that she still does not have ins. And request samples of Lantus pen.Please advise.

## 2013-05-06 ENCOUNTER — Other Ambulatory Visit: Payer: Self-pay

## 2013-10-07 ENCOUNTER — Other Ambulatory Visit: Payer: Self-pay

## 2013-11-08 LAB — HM DIABETES EYE EXAM

## 2013-11-11 ENCOUNTER — Encounter: Payer: Self-pay | Admitting: Family Medicine

## 2013-12-06 ENCOUNTER — Ambulatory Visit (INDEPENDENT_AMBULATORY_CARE_PROVIDER_SITE_OTHER): Payer: No Typology Code available for payment source | Admitting: Family Medicine

## 2013-12-06 ENCOUNTER — Encounter: Payer: Self-pay | Admitting: Family Medicine

## 2013-12-06 VITALS — BP 120/68 | HR 103 | Temp 98.1°F | Ht 66.0 in | Wt 195.5 lb

## 2013-12-06 DIAGNOSIS — E119 Type 2 diabetes mellitus without complications: Secondary | ICD-10-CM

## 2013-12-06 DIAGNOSIS — E1165 Type 2 diabetes mellitus with hyperglycemia: Secondary | ICD-10-CM

## 2013-12-06 MED ORDER — INSULIN DETEMIR 100 UNIT/ML FLEXPEN
PEN_INJECTOR | SUBCUTANEOUS | Status: DC
Start: 2013-12-06 — End: 2014-01-04

## 2013-12-06 NOTE — Patient Instructions (Signed)
Start at 15 units of Levemir daily.  Check BS in the morning fasting and 2 hours after a meal.   When BS < 150 in the morning, then stop increasing insulin dose.  Until them, increase by 1 unit daily.  If > 50 units, split dose. Morning and night.

## 2013-12-06 NOTE — Progress Notes (Signed)
Pre visit review using our clinic review tool, if applicable. No additional management support is needed unless otherwise documented below in the visit note. 

## 2013-12-06 NOTE — Progress Notes (Signed)
[place] [address] Phone: [phone] Fax: [phone]  Patient ID: Lindsey Mason MRN: [id number], DOB: [birth year], 43 y.o. Date of Encounter: 12/06/2013  Primary Physician:  Owens Loffler, MD   Chief Complaint: Diabetes   Subjective:   History of Present Illness:  Lindsey Mason is a 43 y.o. very pleasant female patient who presents with the following:  DM: Lantus: 50 units twice a day Metformin 1000 mg bid.   [employer] insurance.  Strips - accucheck  Prevnar-13  The patient was lost to followup after she lost her insurance. She was on Lantus at 50 units twice a day, and now she is not on any insulin at all. She is still taking some metformin 1000 mg twice a day. She also found some old glipizide, and she did take this for a while as well. Currently she has not been taking this at all.  She thinks that her blood sugars have generally been in the 200s range, but she is currently out of test strips.  She also has been thirsty, urinating quite a bit, and she has been having recurrent yeast infections.  Past Medical History, Surgical History, Social History, Family History, Problem List, Medications, and Allergies have been reviewed and updated if relevant.  Review of Systems:  GEN: No acute illnesses, no fevers, chills. GI: No n/v/d, eating normally Pulm: No SOB Interactive and getting along well at home.  Otherwise, ROS is as per the HPI.  Objective:   Physical Examination: BP 120/68  Pulse 103  Temp(Src) 98.1 F (36.7 C) (Oral)  Ht 5\' 6"  (1.676 m)  Wt 195 lb 8 oz (88.678 kg)  BMI 31.57 kg/m2  LMP 11/18/2013   GEN: WDWN, NAD, Non-toxic, A & O x 3 HEENT: Atraumatic, Normocephalic. Neck supple. No masses, No LAD. Ears and Nose: No external deformity. CV: RRR, No M/G/R. No JVD. No thrill. No extra heart sounds. PULM: CTA B, no wheezes, crackles, rhonchi. No retractions. No resp. distress. No accessory muscle use. EXTR: No  c/c/e NEURO Normal gait.  PSYCH: Normally interactive. Conversant. Not depressed or anxious appearing.  Calm demeanor.   Laboratory and Imaging Data: Lab Results  Component Value Date   HGBA1C 10.4* 05/19/2012   HGBA1C 10.7* 03/04/2012   HGBA1C 9.9* 09/18/2011   Lab Results  Component Value Date   MICROALBUR 1.5 09/18/2011   Que[place] 72 03/04/2012   CREATININE 0.60 07/04/2012    Assessment & Plan:   Poorly controlled type 2 diabetes mellitus   >25 minutes spent in face to face time with patient, >50% spent in counselling or coordination of care: Additional time was spent with the patient trying to figure out what would be most affordable for her and on her insurance plan. Lantus was going to be 100s of dollars. I looked at all available options, and it appears Levemir is a class II medication for her. We will initiate this and titrated upwards. She and I both agree that there is really not any reason to check blood work today, and her blood sugars are going to be incredibly high currently.   New Prescriptions   INSULIN DETEMIR (LEVEMIR) 100 UNIT/ML PEN    Use dosing as directed   Patient Instructions  Start at 15 units of Levemir daily.  Check BS in the morning fasting and 2 hours after a meal.   When BS < 150 in the morning, then stop increasing insulin dose.  Until them, increase by 1 unit daily.  If > 50 units, split dose. Morning and night.     Follow-up: 3 mo Unless noted above, the patient is to follow-up if symptoms worsen. Red flags were reviewed with the patient.  Signed,  Maud Deed. Rafan Sanders, MD, CAQ Sports Medicine   Discontinued Medications   FLUCONAZOLE (DIFLUCAN) 150 MG TABLET    Take 1 tablet (150 mg total) by mouth once.   INSULIN GLARGINE (LANTUS SOLOSTAR) 100 UNIT/ML SOPN    Inject 50 Units into the skin 2 (two) times daily.   INSULIN SYRINGE-NEEDLE U-100 (INSULIN SYRINGE 1CC/31GX5/16") 31G X 5/16" 1 ML MISC    Use as directed for Lantus dosing   METFORMIN  (GLUCOPHAGE) 500 MG TABLET    Take 2 tablets (1,000 mg total) by mouth 2 (two) times daily with a meal.   Current Medications at Discharge:   Medication List       This list is accurate as of: 12/06/13  4:53 PM.  Always use your most recent med list.               diclofenac 75 MG EC tablet  Commonly known as:  VOLTAREN  Take 75 mg by mouth 2 (two) times daily as needed. For pain     Insulin Detemir 100 UNIT/ML Pen  Commonly known as:  LEVEMIR  Use dosing as directed     metFORMIN 1000 MG tablet  Commonly known as:  GLUCOPHAGE  Take 1,000 mg by mouth 2 (two) times daily with a meal.     PEN NEEDLES 31GX5/16" 31G X 8 MM Misc  Use to inject 50 units of Lantus Solostar, 2 times daily

## 2014-01-04 ENCOUNTER — Other Ambulatory Visit: Payer: Self-pay | Admitting: Family Medicine

## 2014-01-04 ENCOUNTER — Encounter (HOSPITAL_COMMUNITY): Payer: Self-pay | Admitting: Emergency Medicine

## 2014-01-04 ENCOUNTER — Emergency Department (HOSPITAL_COMMUNITY)
Admission: EM | Admit: 2014-01-04 | Discharge: 2014-01-04 | Disposition: A | Discharge status: Home / Self Care | Payer: No Typology Code available for payment source | Attending: Emergency Medicine | Admitting: Emergency Medicine

## 2014-01-04 ENCOUNTER — Telehealth: Payer: Self-pay | Admitting: Family Medicine

## 2014-01-04 DIAGNOSIS — Z8719 Personal history of other diseases of the digestive system: Secondary | ICD-10-CM | POA: Insufficient documentation

## 2014-01-04 DIAGNOSIS — Z87891 Personal history of nicotine dependence: Secondary | ICD-10-CM | POA: Insufficient documentation

## 2014-01-04 DIAGNOSIS — Z791 Long term (current) use of non-steroidal anti-inflammatories (NSAID): Secondary | ICD-10-CM | POA: Insufficient documentation

## 2014-01-04 DIAGNOSIS — R42 Dizziness and giddiness: Secondary | ICD-10-CM

## 2014-01-04 DIAGNOSIS — Z794 Long term (current) use of insulin: Secondary | ICD-10-CM | POA: Insufficient documentation

## 2014-01-04 DIAGNOSIS — G5731 Lesion of lateral popliteal nerve, right lower limb: Secondary | ICD-10-CM

## 2014-01-04 DIAGNOSIS — G5730 Lesion of lateral popliteal nerve, unspecified lower limb: Secondary | ICD-10-CM | POA: Insufficient documentation

## 2014-01-04 DIAGNOSIS — E119 Type 2 diabetes mellitus without complications: Secondary | ICD-10-CM | POA: Insufficient documentation

## 2014-01-04 LAB — CBC WITH DIFFERENTIAL/PLATELET
BASOS ABS: 0 10*3/uL (ref 0.0–0.1)
Basophils Relative: 0 % (ref 0–1)
Eosinophils Absolute: 0.2 10*3/uL (ref 0.0–0.7)
Eosinophils Relative: 2 % (ref 0–5)
HCT: 43.1 % (ref 36.0–46.0)
Hemoglobin: 14.7 g/dL (ref 12.0–15.0)
LYMPHS ABS: 3.2 10*3/uL (ref 0.7–4.0)
LYMPHS PCT: 30 % (ref 12–46)
MCH: 29.7 pg (ref 26.0–34.0)
MCHC: 34.1 g/dL (ref 30.0–36.0)
MCV: 87.1 fL (ref 78.0–100.0)
Monocytes Absolute: 0.5 10*3/uL (ref 0.1–1.0)
Monocytes Relative: 4 % (ref 3–12)
NEUTROS ABS: 6.8 10*3/uL (ref 1.7–7.7)
NEUTROS PCT: 64 % (ref 43–77)
PLATELETS: 266 10*3/uL (ref 150–400)
RBC: 4.95 MIL/uL (ref 3.87–5.11)
RDW: 13.5 % (ref 11.5–15.5)
WBC: 10.7 10*3/uL — AB (ref 4.0–10.5)

## 2014-01-04 LAB — BASIC METABOLIC PANEL
ANION GAP: 17 — AB (ref 5–15)
BUN: 12 mg/dL (ref 6–23)
CALCIUM: 9.2 mg/dL (ref 8.4–10.5)
CHLORIDE: 99 meq/L (ref 96–112)
CO2: 23 meq/L (ref 19–32)
Creatinine, Ser: 0.36 mg/dL — ABNORMAL LOW (ref 0.50–1.10)
GFR calc Af Amer: >90 mL/min (ref >90)
GFR calc non Af Amer: >90 mL/min (ref >90)
GLUCOSE: 318 mg/dL — AB (ref 70–99)
POTASSIUM: 3.8 meq/L (ref 3.7–5.3)
SODIUM: 139 meq/L (ref 137–147)

## 2014-01-04 LAB — CBG MONITORING, ED
GLUCOSE-CAPILLARY: 290 mg/dL — AB (ref 70–99)
Glucose-Capillary: 300 mg/dL — ABNORMAL HIGH (ref 70–99)

## 2014-01-04 MED ORDER — NAPROXEN 500 MG PO TABS: 500.0000 mg | ORAL_TABLET | Freq: Two times a day (BID) | ORAL | Status: DC

## 2014-01-04 NOTE — ED Notes (Signed)
MD at bedside. 

## 2014-01-04 NOTE — Telephone Encounter (Signed)
Agreed -

## 2014-01-04 NOTE — Telephone Encounter (Signed)
Completely agree.

## 2014-01-04 NOTE — Telephone Encounter (Signed)
Noted  

## 2014-01-04 NOTE — ED Provider Notes (Signed)
CSN: [id number]     Arrival date & time 01/04/14  1657 History   First MD Initiated Contact with Patient 01/04/14 2126     Chief Complaint  Patient presents with  . Numbness     (Consider location/radiation/quality/duration/timing/severity/associated sxs/prior Treatment) HPI Comments: Patient complains of numbness and tingling to her right foot and lateral lower leg since yesterday. This onset while she was sitting with her legs crossed for prolonged period of time. He has been constant and unchanged. She denies any back pain or abdominal pain. No bowel or bladder incontinence. No fever or vomiting. She's able to ambulate. She feels that she is having  sensation of the foot being asleep that is unchanged. Is a diabetic and recently restarted on her insulin.    The history is provided by the patient.    Past Medical History  Diagnosis Date  . Headache(784.0)   . Esophageal reflux   . Diabetes mellitus without mention of complication   . Pure hypercholesterolemia   . Rosacea   . PCOS (polycystic ovarian syndrome) 05/27/2012   Past Surgical History  Procedure Laterality Date  . Cesarean section      triplets and singles  . Appendectomy    . Tubal ligation     Family History  Problem Relation Age of Onset  . Cancer Mother     breast   History  Substance Use Topics  . Smoking status: Former Smoker    Types: Cigarettes    Quit date: 03/04/2004  . Smokeless tobacco: Never Used  . Alcohol Use: Yes     Comment: occ   OB History   Grav Para Term Preterm Abortions TAB SAB Ect Mult Living                 Review of Systems    Allergies  Review of patient's allergies indicates no known allergies.  Home Medications   Prior to Admission medications   Medication Sig Start Date End Date Taking? Authorizing Provider  ibuprofen (ADVIL,MOTRIN) 200 MG tablet Take 400 mg by mouth every 6 (six) hours as needed.   Yes Historical Provider, MD  Insulin Detemir (LEVEMIR FLEXPEN) 100  UNIT/ML Pen Inject 32 Units into the skin 2 (two) times daily.   Yes Historical Provider, MD  Insulin Pen Needle (PEN NEEDLES 31GX5/16") 31G X 8 MM MISC 1 each by Does not apply route 2 (two) times daily.   Yes Historical Provider, MD  metFORMIN (GLUCOPHAGE) 1000 MG tablet Take 1,000 mg by mouth 2 (two) times daily with a meal.   Yes Historical Provider, MD  naproxen (NAPROSYN) 500 MG tablet Take 1 tablet (500 mg total) by mouth 2 (two) times daily. 01/04/14   Ezequiel Essex, MD   BP 125/82  Pulse 107  Temp(Src) 98.2 F (36.8 C) (Oral)  Resp 16  Ht 5\' 6"  (1.676 m)  Wt 198 lb (89.812 kg)  BMI 31.97 kg/m2  SpO2 98%  LMP 12/22/2013 Physical Exam  Nursing note and vitals reviewed. Constitutional: She is oriented to person, place, and time. She appears well-developed and well-nourished. No distress.  HENT:  Head: Normocephalic and atraumatic.  Mouth/Throat: Oropharynx is clear and moist. No oropharyngeal exudate.  Eyes: Conjunctivae and EOM are normal. Pupils are equal, round, and reactive to light.  Neck: Normal range of motion. Neck supple.  No meningismus.  Cardiovascular: Normal rate, regular rhythm, normal heart sounds and intact distal pulses.   No murmur heard. Pulmonary/Chest: Effort normal and breath sounds normal. No  respiratory distress.  Abdominal: Soft. There is no tenderness. There is no rebound and no guarding.  Musculoskeletal: Normal range of motion. She exhibits no edema and no tenderness.  5/5 strength in bilateral lower extremities. Ankle plantar and dorsiflexion intact. Great toe extension intact bilaterally. +2 DP and PT pulses. +2 patellar reflexes bilaterally. Normal gait.   Neurological: She is alert and oriented to person, place, and time. No cranial nerve deficit. She exhibits normal muscle tone. Coordination normal.  No ataxia on finger to nose bilaterally. No pronator drift. 5/5 strength throughout. CN 2-12 intact. Negative Romberg. Equal grip strength.  Sensation intact. Gait is normal.   Patient has paresthesias to her right lateral calf and foot. Has equal strength in her flexion and extension of her ankle and great toe extension bilaterally. Intact distal pulses. Some weakness with eversion of R foot.  No foot drop.  Skin: Skin is warm.  Psychiatric: She has a normal mood and affect. Her behavior is normal.    ED Course  Procedures (including critical care time) Labs Review Labs Reviewed  CBC WITH DIFFERENTIAL - Abnormal; Notable for the following:    WBC 10.7 (*)    All other components within normal limits  BASIC METABOLIC PANEL - Abnormal; Notable for the following:    Glucose, Bld 318 (*)    Creatinine, Ser 0.36 (*)    Anion gap 17 (*)    All other components within normal limits  CBG MONITORING, ED - Abnormal; Notable for the following:    Glucose-Capillary 290 (*)    All other components within normal limits  CBG MONITORING, ED - Abnormal; Notable for the following:    Glucose-Capillary 300 (*)    All other components within normal limits    Imaging Review No results found.   EKG Interpretation None      MDM   Final diagnoses:  Peroneal nerve palsy, right   Paresthesias in the right lateral foot and leg since yesterday. Likely from compression of peroneal nerve. She does not have any foot drop.  She's able to ambulate. She does have some weakness on foot eversion. Ankle splint provided. Labs show hyperglycemia without evidence of DKA Discussed supportive care with patient such as avoiding compression of the area as well as NSAIDs.  Follow up with PCP.  Ezequiel Essex, MD 01/05/14 [phone]

## 2014-01-04 NOTE — ED Notes (Signed)
Presents with onset of right foot and lower leg numbness. Began yesterday while sitting with legs crossed at a meeting. Numbness has not gotten better. No facial droop or arm drift, no aphasia. +2 pedal pulse, brisk cap refill, good color. Equal grips bilaterally, denies confusion. Reports headache.

## 2014-01-04 NOTE — Discharge Instructions (Signed)
Common Peroneal Nerve Entrapment with Rehab The peroneal nerve and its branches are responsible for muscle control of the muscles that extend to the toes, foot, ankle. This nerve is also responsible for sensation on the outer side of the lower leg and foot. Injury to the peroneal nerve results in problems with sensation and muscle control in these areas. Injury to the peroneal nerve often occurs in the area where the nerve passes around the top of one of the lower leg bones (fibular head). The nerve becomes trapped, causing pain, tingling, numbness, or burning sensations. SYMPTOMS   Pain, tingling, numbness, or burning on the top of the foot, ankle, or outer part of the lower leg.  Pain that gets worse with physical activity (walking, running, squatting).  Weakness when lifting the foot, including moving the ankle and toes upward (foot drop), or turning the foot outward with walking.  Problems walking (having to lift the foot high) or running, including tripping over the foot.  Inflammation, bruising (contusion), and tenderness near the outer part of the knee (or just below the knee). CAUSES  Common peroneal nerve entrapment is caused by pressure being placed on the peroneal nerve. This pressure may occur due to direct contact (being tackled at the knees), inflammation, a cyst in the knee, or a healing fracture around the knee. Less commonly, peroneal nerve entrapment may be caused by a stretch injury (knee sprain) or with swelling in the leg (compartment syndrome). RISK INCREASES WITH:  Recurring foot, ankle, or knee sprains.  Playing sports on uneven ground, which may result in knee or ankle sprains.  Direct injury (trauma) to the knee. PREVENTION  Warm up and stretch properly before activity.  Maintain physical fitness:  Strength, flexibility, and endurance.  Cardiovascular fitness.  Wear properly fitted and padded protective equipment. PROGNOSIS  Common peroneal nerve  entrapment is often curable with non-surgical treatment. Often symptoms will go away on their own (spontaneously). Sometimes, surgery is needed to relieve pressure from the nerve.  RELATED COMPLICATIONS   Permanent pain, tingling, numbness, or weakness of the affected foot, ankle, and leg.  Inability to compete, due to pain or weakness.  Injury to other parts of the body, as a result of repeated tripping and falling over the foot. TREATMENT Treatment first involves resting from any activities that cause the symptoms to get worse. The use of ice and medicine may reduce pain and inflammation. If ice is used, do not place it directly on the skin. Instead, place a towel in-between. If there is weakness of the muscles, causing foot drop, bracing the ankle and foot may be needed. It is important to perform strengthening and stretching exercises to maintain muscle strength. These exercises may be completed at home or with a therapist. If pain continues to get worse, despite treatment, or a cyst is present, surgery may be needed to relieve the pressure on the nerve. If the pressure on the nerve is due to compartment syndrome, a fascial (sheet of connective tissue) release may need to be performed. The earlier surgery is performed, the better your chances of full recovery.  MEDICATION   If pain medicine is needed, nonsteroidal anti-inflammatory medicines (aspirin and ibuprofen), or other minor pain relievers (acetaminophen), are often advised.  Do not take pain medicine for 7 days before surgery.  Prescription pain relievers may be given if your caregiver thinks they are needed. Use only as directed and only as much as you need. COLD THERAPY   Cold treatment (icing) should  be applied for 10 to 15 minutes every 2 to 3 hours for inflammation and pain, and immediately after activity that aggravates your symptoms. Use ice packs or an ice massage. SEEK MEDICAL CARE IF:   Symptoms get worse.  Symptoms do  not improve in 2 weeks, despite treatment.  New, unexplained symptoms develop. (Drugs used in treatment may produce side effects.) EXERCISES RANGE OF MOTION (ROM) AND STRETCHING EXERCISES - Common Peroneal Nerve Entrapment These exercises may help you when beginning to rehabilitate your injury. Your symptoms may go away with or without further involvement from your physician, physical therapist or athletic trainer. While completing these exercises, remember:   Restoring tissue flexibility helps normal motion to return to the joints. This allows healthier, less painful movement and activity.  An effective stretch should be held for at least 30 seconds.  A stretch should never be painful. You should only feel a gentle lengthening or release in the stretched tissue. RANGE OF MOTION - Ankle Eversion   Sit with your right / left ankle crossed over your opposite knee.  Grip your foot with your opposite hand, placing your thumb on the top of your foot and your fingers across the bottom of your foot.  Gently push your foot downward with a slight rotation, so your littlest toes rise slightly toward the ceiling.  You should feel a gentle stretch on the inside of your ankle. Hold the stretch for __________ seconds. Repeat __________ times. Complete this exercise __________ times per day.  RANGE OF MOTION - Ankle Inversion   Sit with your right / left ankle crossed over your opposite knee.  Grip your foot with your opposite hand, placing your thumb on the bottom of your foot and your fingers across the top of your foot.  Gently pull your foot so the smallest toe comes toward you and your thumb pushes the inside of the ball of your foot away from you.  You should feel a gentle stretch on the outside of your ankle. Hold the stretch for __________ seconds. Repeat __________ times. Complete this exercise __________ times per day.  RANGE OF MOTION - Ankle Dorsiflexion, Active Assisted   Remove your  shoes and sit on a chair, preferably not on a carpeted surface.  Place your right / left foot directly under your knee. Extend your opposite leg for support.  Keeping your heel down, slide your right / left foot back toward the chair, until you feel a stretch at your ankle or calf. If you do not feel a stretch, slide your bottom forward to the edge of the chair, while still keeping your heel down.  Hold this stretch for __________ seconds. Repeat __________ times. Complete this stretch __________ times per day.  STRETCH - Gastroc, Standing   Place your hands on a wall.  Extend your right / left leg behind you, and place a folded washcloth under the arch of your foot for support. Keep the front knee somewhat bent.  Slightly point your toes inward on your back foot.  Keeping your right / left heel on the floor and your knee straight, shift your weight toward the wall, not allowing your back to arch.  You should feel a gentle stretch in the right / left calf. Hold this position for __________ seconds. Repeat __________ times. Complete this stretch __________ times per day. STRETCH - Soleus, Standing   Place your hands on a wall.  Extend your right / left leg behind you, and place a folded  washcloth under the arch of your foot for support. Keep the front knee somewhat bent.  Slightly point your toes inward on your back foot.  Keep your right / left heel on the floor, bend your back knee, and slightly shift your weight over the back leg, so that you feel a gentle stretch deep in your back calf.  Hold this position for __________ seconds. Repeat __________ times. Complete this stretch __________ times per day. STRETCH - Hamstrings, Standing  Stand or sit and extend your right / left leg, placing your foot on a chair or foot stool, keeping a slight arch in your low back and your hips straight forward.  Lead with your chest and lean forward at the waist, until you feel a gentle stretch in  the back of your right / left knee or thigh. (When done correctly, this exercise requires leaning only a small distance.)  Hold this position for __________ seconds. Repeat __________ times. Complete this stretch __________ times per day. STRENGTHENING EXERCISES - Common Peroneal Nerve Entrapment These exercises may help you when beginning to rehabilitate your injury. They may resolve your symptoms with or without further involvement from your physician, physical therapist or athletic trainer. While completing these exercises, remember:   Muscles can gain both the endurance and the strength needed for everyday activities through controlled exercises.  Complete these exercises as instructed by your physician, physical therapist or athletic trainer. Increase the resistance and repetitions only as guided.  You may experience muscle soreness or fatigue, but the pain or discomfort you are trying to eliminate should never worsen during these exercises. If this pain does get worse, stop and make sure you are following the directions exactly. If the pain is still present after adjustments, discontinue the exercise until you can discuss the trouble with your caregiver. STRENGTH - Dorsiflexors  Secure a rubber exercise band or tubing to a fixed object (table, pole) and loop the other end around your right / left foot.  Sit on the floor facing the fixed object. The band should be slightly tense when your foot is relaxed.  Slowly draw your foot back toward you, using your ankle and toes.  Hold this position for __________ seconds. Slowly release the tension in the band and return your foot to the starting position. Repeat __________ times. Complete this exercise __________ times per day.  STRENGTH - Ankle Eversion   Secure one end of a rubber exercise band or tubing to a fixed object (table, pole). Loop the other end around your foot, just before your toes.  Place your fists between your knees. This  will focus your strengthening at your ankle.  Drawing the band across your opposite foot, away from the pole, slowly, pull your little toe out and up. Make sure the band is positioned to resist the entire motion.  Hold this position for __________ seconds.  Return to the starting position slowly, controlling the tension in the band. Repeat __________ times. Complete this exercise __________ times per day.  STRENGTH - Ankle Inversion   Secure one end of a rubber exercise band or tubing to a fixed object (table, pole). Loop the other end around your foot, just before your toes.  Place your fists between your knees. This will focus your strengthening at your ankle.  Slowly, pull your big toe up and in, making sure the band is positioned to resist the entire motion.  Hold this position for __________ seconds.  Return to the starting position slowly, controlling  the tension in the band. Repeat __________ times. Complete this exercises __________ times per day.  Document Released: 06/17/2005 Document Revised: 10/12/2012 Document Reviewed: 09/29/2008 [facility] Patient Information 2015 [place], [place]. This information is not intended to replace advice given to you by your health care provider. Make sure you discuss any questions you have with your health care provider.

## 2014-01-04 NOTE — ED Notes (Signed)
Patient discharged with all personal belongings. 

## 2014-01-04 NOTE — ED Notes (Signed)
Patient states she has just recently started back on her diabetes medication.

## 2014-01-04 NOTE — Telephone Encounter (Signed)
Patient Information:  Caller Name: [name]  Phone: [phone]  Patient: Lindsey Mason, Lindsey Mason  Gender: Female  DOB: 01/15/1971  Age: 43 Years  PCP: Owens Loffler [facility])  Pregnant: No  Office Follow Up:  Does the office need to follow up with this patient?: No  Instructions For The Office: N/A  RN Note:  Did not test FBS 01/04/14.  Random blood sugar 318 at 1220.  Breakfast at 0900 was egg & sausage bisquit and coffee with Stevia.  Can feel pulse in foot. Foot color is normal. Had 32 units Metformin and 1000 mg Metformin at 0900. Instructed to drink water now to lower blood sugar and call 911 for sudden onset of numbness & weakness in right lower leg that is still present 24 later.  Agreed to call 911 now.  Symptoms  Reason For Call & Symptoms: Right lower leg became numb after sitting with legs crossed.  Could hardly stand and had difficutly walking; reports felt like she was dragging her foot. Today, 01/04/14 still has some difficulty walking and leg feels numb.  Reviewed Health History In EMR: Yes  Reviewed Medications In EMR: Yes  Reviewed Allergies In EMR: Yes  Reviewed Surgeries / Procedures: Yes  Date of Onset of Symptoms: 01/03/2014  Treatments Tried: massaged leg  Treatments Tried Worked: Yes OB / GYN:  LMP: 12/21/2013  Guideline(s) Used:  Neurologic Deficit  Disposition Per Guideline:   Call EMS 911 Now  Reason For Disposition Reached:   New neurologic deficit that is present NOW, sudden onset of ANY of the following:   Weakness of the face, arm, or leg on one side of the body  Numbness of the face, arm, or leg on one side of the body  Loss of speech or garbled speech  Advice Given:  N/A  Patient Will Follow Care Advice:  YES

## 2014-01-19 ENCOUNTER — Encounter: Payer: Self-pay | Admitting: Cardiovascular Disease

## 2014-01-19 ENCOUNTER — Ambulatory Visit (INDEPENDENT_AMBULATORY_CARE_PROVIDER_SITE_OTHER): Payer: No Typology Code available for payment source | Admitting: Cardiovascular Disease

## 2014-01-19 VITALS — BP 120/82 | HR 86 | Ht 66.0 in | Wt 191.0 lb

## 2014-01-19 DIAGNOSIS — S8410XA Injury of peroneal nerve at lower leg level, unspecified leg, initial encounter: Secondary | ICD-10-CM | POA: Insufficient documentation

## 2014-01-19 DIAGNOSIS — E119 Type 2 diabetes mellitus without complications: Secondary | ICD-10-CM

## 2014-01-19 DIAGNOSIS — S8490XS Injury of unspecified nerve at lower leg level, unspecified leg, sequela: Secondary | ICD-10-CM

## 2014-01-19 DIAGNOSIS — E1165 Type 2 diabetes mellitus with hyperglycemia: Secondary | ICD-10-CM

## 2014-01-19 DIAGNOSIS — IMO0002 Reserved for concepts with insufficient information to code with codable children: Secondary | ICD-10-CM

## 2014-01-19 DIAGNOSIS — S346XXS Injury of peripheral nerve(s) at abdomen, lower back and pelvis level, sequela: Secondary | ICD-10-CM

## 2014-01-19 DIAGNOSIS — S8411XS Injury of peroneal nerve at lower leg level, right leg, sequela: Secondary | ICD-10-CM

## 2014-01-19 DIAGNOSIS — E785 Hyperlipidemia, unspecified: Secondary | ICD-10-CM

## 2014-01-19 NOTE — Assessment & Plan Note (Signed)
Suggested she work closely with primary care. We have encouraged continued exercise, careful diet management in an effort to lose weight.

## 2014-01-19 NOTE — Assessment & Plan Note (Addendum)
Cholesterol was well controlled 2 years ago at a time when her hemoglobin A1c was 10. Cholesterol that time was 135. Could repeat her lipids to make sure they continue to run low, will likely not need a statin

## 2014-01-19 NOTE — Assessment & Plan Note (Signed)
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 Recommended supportive care. This should slowly improve

## 2014-01-19 NOTE — Patient Instructions (Signed)
You are doing well. No medication changes were made.  You have nerve trauma that should slowly get better Walk as much as possible  Please call us if you have new issues that need to be addressed before your next appt.

## 2014-01-19 NOTE — Progress Notes (Signed)
Patient ID: Lindsey Mason, female    DOB: 01-Oct-1970, 43 y.o.   MRN: [id number]  HPI Comments: Lindsey Mason is a pleasant 43 year old woman with 5 children, diabetes who presents for evaluation of numbness in her legs.  She reports that 01/03/2014 she attended a meeting and had her legs crossed for many hours. When she stood up to walk, she had acute onset of numbness down the right lateral aspect of her leg below the knee. She was seen in urgent care and was told she had pressed on her peroneal nerve. Since then she's had some improvement of her symptoms but numbness has been slow to resolve.  She reports having gestational diabetes, on her last child diabetes did not improve. Mother had diabetes and stroke at an early age in her 44s Sugars have been high as she lost her insurance, now has insurance again. She tries to watch her diet. She does not do any regular exercise and she works long hours.  When her hemoglobin was 10, total cholesterol that time 130 in September 2013  EKG shows normal sinus rhythm with rate 86 beats per minute, no significant ST or T wave changes     Outpatient Encounter Prescriptions as of 01/19/2014  Medication Sig  . ibuprofen (ADVIL,MOTRIN) 200 MG tablet Take 400 mg by mouth every 6 (six) hours as needed.  . Insulin Detemir (LEVEMIR FLEXPEN) 100 UNIT/ML Pen Inject 32 Units into the skin 2 (two) times daily.  . Insulin Pen Needle (PEN NEEDLES 31GX5/16") 31G X 8 MM MISC 1 each by Does not apply route 2 (two) times daily.  . metFORMIN (GLUCOPHAGE) 1000 MG tablet Take 1,000 mg by mouth 2 (two) times daily with a meal.  . naproxen (NAPROSYN) 500 MG tablet Take 1 tablet (500 mg total) by mouth 2 (two) times daily.     Review of Systems  Constitutional: Negative.   HENT: Negative.   Eyes: Negative.   Respiratory: Negative.   Cardiovascular: Negative.   Gastrointestinal: Negative.   Endocrine: Negative.   Musculoskeletal: Negative.   Skin: Negative.    Allergic/Immunologic: Negative.   Neurological: Negative.        Numbness of her right lower extremity, below the knee  Hematological: Negative.   Psychiatric/Behavioral: Negative.   All other systems reviewed and are negative.   BP 120/82  Pulse 86  Ht 5\' 6"  (1.676 m)  Wt 191 lb (86.637 kg)  BMI 30.84 kg/m2  LMP 12/22/2013  Physical Exam  Nursing note and vitals reviewed. Constitutional: She is oriented to person, place, and time. She appears well-developed and well-nourished.  HENT:  Head: Normocephalic.  Nose: Nose normal.  Mouth/Throat: Oropharynx is clear and moist.  Eyes: Conjunctivae are normal. Pupils are equal, round, and reactive to light.  Neck: Normal range of motion. Neck supple. No JVD present.  Cardiovascular: Normal rate, regular rhythm, S1 normal, S2 normal, normal heart sounds and intact distal pulses.  Exam reveals no gallop and no friction rub.   No murmur heard. Pulmonary/Chest: Effort normal and breath sounds normal. No respiratory distress. She has no wheezes. She has no rales. She exhibits no tenderness.  Abdominal: Soft. Bowel sounds are normal. She exhibits no distension. There is no tenderness.  Musculoskeletal: Normal range of motion. She exhibits no edema and no tenderness.  Lymphadenopathy:    She has no cervical adenopathy.  Neurological: She is alert and oriented to person, place, and time. Coordination normal.  Skin: Skin is warm and dry. No  rash noted. No erythema.  Psychiatric: She has a normal mood and affect. Her behavior is normal. Judgment and thought content normal.    Assessment and Plan

## 2014-02-09 ENCOUNTER — Ambulatory Visit (INDEPENDENT_AMBULATORY_CARE_PROVIDER_SITE_OTHER): Payer: No Typology Code available for payment source | Admitting: Family Medicine

## 2014-02-09 ENCOUNTER — Encounter: Payer: Self-pay | Admitting: Family Medicine

## 2014-02-09 VITALS — BP 100/72 | HR 88 | Temp 98.3°F | Ht 66.0 in | Wt 193.5 lb

## 2014-02-09 DIAGNOSIS — M7500 Adhesive capsulitis of unspecified shoulder: Secondary | ICD-10-CM

## 2014-02-09 DIAGNOSIS — M7502 Adhesive capsulitis of left shoulder: Secondary | ICD-10-CM

## 2014-02-09 NOTE — Progress Notes (Signed)
Pre visit review using our clinic review tool, if applicable. No additional management support is needed unless otherwise documented below in the visit note. 

## 2014-02-09 NOTE — Progress Notes (Signed)
02/09/2014    ID: Lindsey Mason   MRN: [id number]  DOB: 09/21/1970  Primary Physician:  Lindsey Loffler, MD  Chief Complaint: Shoulder Pain  Subjective:   History of Present Illness:  Lindsey Mason is a 43 y.o. very pleasant female patient who presents with the following: shoulder pain  The patient noted above presents with shoulder pain that has been ongoing for 2 mo there is no history of trauma or accident. The patient denies neck pain or radicular symptoms. No shoulder blade pain Denies dislocation, subluxation, separation of the shoulder. The patient does not complain of pain in the overhead plane - restricted. Significant restriction of motion.  Shoulder - hurting all the time. Slowly for a couple of months.  Left shoulder pain. No injury. Frozen shoulder.  Medications Tried: otc meds Ice or Heat: minimal help Tried PT: No  Prior shoulder Injury: R frozen shoulder Prior surgery: No Prior fracture: No  Past Medical History, Surgical History, Social History, Family History, Medications, and allergies reviewed and updated if relevant.   Review of Systems  GEN: No fevers, chills. Nontoxic. Primarily MSK c/o today. MSK: Detailed in the HPI GI: tolerating PO intake without difficulty Neuro: No numbness, parasthesias, or tingling associated. Otherwise the pertinent positives of the ROS are noted above.     Objective:   Physical Examination Filed Vitals:   02/09/14 1228  BP: 100/72  Pulse: 88  Temp: 98.3 F (36.8 C)  TempSrc: Oral  Height: 5\' 6"  (1.676 m)  Weight: 193 lb 8 oz (87.771 kg)    GEN: Well-developed,well-nourished,in no acute distress; alert,appropriate and cooperative throughout examination HEENT: Normocephalic and atraumatic without obvious abnormalities. Ears, externally no deformities PULM: Breathing comfortably in no respiratory distress EXT: No clubbing, cyanosis, or edema PSYCH: Normally interactive. Cooperative during the interview.  Pleasant. Friendly and conversant. Not anxious or depressed appearing. Normal, full affect.  Shoulder: L Inspection: No muscle wasting or winging Ecchymosis/edema: neg  AC joint, scapula, clavicle: NT Cervical spine: NT, full ROM Spurling's: neg Abduction: 5/5, 90 only Flexion: 5/5, 90 IR, full, lift-off: 5/5, none at 90 abd ER at neutral: 5/5, minimal at 90 AC crossover and compression: unable to complete Additional special testing is equivocal given lack of motion C5-T1 intact Sensation intact Grip 5/5  Assessment & Plan:   Adhesive capsulitis of left shoulder  >25 minutes spent in face to face time with patient, >50% spent in counselling or coordination of care  Patient was given a systematic ROM protocol from [employer] or [facility] to be done daily. Emphasized importance of adherence, help of PT, daily HEP.  The average length of total symptoms is 12-18 months going through 3 different phases in the freezing and thawing process. Reviewed all with patient.   Tylenol or NSAID of choice prn for pain relief Intraarticular shoulder injections discussed with patient, which have good evidence for accelerating the thawing phase.  Intrarticular Shoulder Injection, LEFT Verbal consent was obtained from the patient. Risks including infection explained and contrasted with benefits and alternatives. Patient prepped with Chloraprep and Ethyl Chloride used for anesthesia. An intraarticular shoulder injection was performed using the posterior approach. The patient tolerated the procedure well and had decreased pain post injection. No complications. Injection: 5 cc of Lidocaine 1% and Depo-Medrol 40 mg. Needle: 22 gauge   New Prescriptions   No medications on file   Discontinued Medications   No medications on file   Modified Medications   No medications on file   No  orders of the defined types were placed in this encounter.   Follow-up: No Follow-up on file. Unless noted above, the  patient is to follow-up if symptoms worsen. Red flags were reviewed with the patient.  Signed,  Lindsey Mason. Lindsey Gubler, MD, [facility] [place], [address] Phone: [phone] Fax: [phone]  Current Medications at Discharge:   Medication List       This list is accurate as of: 02/09/14  2:10 PM.  Always use your most recent med list.               ibuprofen 200 MG tablet  Commonly known as:  ADVIL,MOTRIN  Take 400 mg by mouth every 6 (six) hours as needed.     LEVEMIR FLEXPEN 100 UNIT/ML Pen  Generic drug:  Insulin Detemir  Inject 32 Units into the skin 2 (two) times daily.     metFORMIN 1000 MG tablet  Commonly known as:  GLUCOPHAGE  Take 1,000 mg by mouth 2 (two) times daily with a meal.     naproxen 500 MG tablet  Commonly known as:  NAPROSYN  Take 1 tablet (500 mg total) by mouth 2 (two) times daily.     PEN NEEDLES 31GX5/16" 31G X 8 MM Misc  1 each by Does not apply route 2 (two) times daily.

## 2014-03-09 ENCOUNTER — Encounter: Payer: Self-pay | Admitting: Family Medicine

## 2014-03-09 ENCOUNTER — Ambulatory Visit (INDEPENDENT_AMBULATORY_CARE_PROVIDER_SITE_OTHER): Payer: No Typology Code available for payment source | Admitting: Family Medicine

## 2014-03-09 VITALS — BP 112/76 | HR 99 | Temp 97.9°F | Ht 66.0 in | Wt 196.0 lb

## 2014-03-09 DIAGNOSIS — M7500 Adhesive capsulitis of unspecified shoulder: Secondary | ICD-10-CM

## 2014-03-09 DIAGNOSIS — R5381 Other malaise: Secondary | ICD-10-CM

## 2014-03-09 DIAGNOSIS — R5383 Other fatigue: Secondary | ICD-10-CM

## 2014-03-09 DIAGNOSIS — E1165 Type 2 diabetes mellitus with hyperglycemia: Secondary | ICD-10-CM

## 2014-03-09 DIAGNOSIS — M7502 Adhesive capsulitis of left shoulder: Secondary | ICD-10-CM

## 2014-03-09 DIAGNOSIS — Z23 Encounter for immunization: Secondary | ICD-10-CM

## 2014-03-09 DIAGNOSIS — E785 Hyperlipidemia, unspecified: Secondary | ICD-10-CM

## 2014-03-09 DIAGNOSIS — E119 Type 2 diabetes mellitus without complications: Secondary | ICD-10-CM

## 2014-03-09 LAB — BASIC METABOLIC PANEL
BUN: 9 mg/dL (ref 6–23)
CALCIUM: 8.6 mg/dL (ref 8.4–10.5)
CHLORIDE: 102 meq/L (ref 96–112)
CO2: 24 mEq/L (ref 19–32)
CREATININE: 0.5 mg/dL (ref 0.4–1.2)
GFR: 133.16 mL/min (ref >60.00)
Glucose, Bld: 379 mg/dL — ABNORMAL HIGH (ref 70–99)
Potassium: 4.2 mEq/L (ref 3.5–5.1)
Sodium: 136 mEq/L (ref 135–145)

## 2014-03-09 LAB — CBC WITH DIFFERENTIAL/PLATELET
BASOS PCT: 0.4 % (ref 0.0–3.0)
Basophils Absolute: 0 10*3/uL (ref 0.0–0.1)
EOS ABS: 0.2 10*3/uL (ref 0.0–0.7)
Eosinophils Relative: 2.1 % (ref 0.0–5.0)
HCT: 43 % (ref 36.0–46.0)
Hemoglobin: 14.4 g/dL (ref 12.0–15.0)
Lymphocytes Relative: 27.3 % (ref 12.0–46.0)
Lymphs Abs: 2.5 10*3/uL (ref 0.7–4.0)
MCHC: 33.4 g/dL (ref 30.0–36.0)
MCV: 87.6 fl (ref 78.0–100.0)
Monocytes Absolute: 0.5 10*3/uL (ref 0.1–1.0)
Monocytes Relative: 5.3 % (ref 3.0–12.0)
NEUTROS ABS: 6 10*3/uL (ref 1.4–7.7)
Neutrophils Relative %: 64.9 % (ref 43.0–77.0)
Platelets: 277 10*3/uL (ref 150.0–400.0)
RBC: 4.91 Mil/uL (ref 3.87–5.11)
RDW: 14.3 % (ref 11.5–15.5)
WBC: 9.3 10*3/uL (ref 4.0–10.5)

## 2014-03-09 LAB — TSH: TSH: 0.38 u[IU]/mL (ref 0.35–4.50)

## 2014-03-09 LAB — HEPATIC FUNCTION PANEL
ALT: 17 U/L (ref 0–35)
AST: 20 U/L (ref 0–37)
Albumin: 3.9 g/dL (ref 3.5–5.2)
Alkaline Phosphatase: 76 U/L (ref 39–117)
BILIRUBIN DIRECT: 0 mg/dL (ref 0.0–0.3)
BILIRUBIN TOTAL: 0.7 mg/dL (ref 0.2–1.2)
Total Protein: 7.1 g/dL (ref 6.0–8.3)

## 2014-03-09 LAB — LDL CHOLESTEROL, DIRECT: LDL DIRECT: 104.2 mg/dL

## 2014-03-09 LAB — MICROALBUMIN / CREATININE URINE RATIO
CREATININE, U: 38.4 mg/dL
MICROALB UR: 1.1 mg/dL (ref 0.0–1.9)
MICROALB/CREAT RATIO: 2.9 mg/g (ref 0.0–30.0)

## 2014-03-09 LAB — HEMOGLOBIN A1C: Hgb A1c MFr Bld: 11.9 % — ABNORMAL HIGH (ref 4.6–6.5)

## 2014-03-09 MED ORDER — HYDROCODONE-ACETAMINOPHEN 5-325 MG PO TABS: 1.0000 | ORAL_TABLET | Freq: Four times a day (QID) | ORAL | Status: DC | PRN

## 2014-03-09 NOTE — Progress Notes (Signed)
Pre visit review using our clinic review tool, if applicable. No additional management support is needed unless otherwise documented below in the visit note. 

## 2014-03-09 NOTE — Progress Notes (Signed)
Dr. Frederico Hamman T. Stina Gane, MD, [facility] [place], [address] Phone: [phone] Fax: [phone]  03/09/2014  Patient: Lindsey Mason, MRN: [id number], DOB: September 20, 1970, 43 y.o.  Primary Physician:  Owens Loffler, MD  Chief Complaint: Follow-up and Shoulder Pain  Subjective:   Lindsey Mason is a 43 y.o. very pleasant female patient who presents with the following:  DM: She is mainly here for d/m today. [id number]. Flu shot  Diabetes Mellitus: Tolerating Medications: yes - BS about 200 per report.  Compliance with diet: fair Exercise: minimal / intermittent Avg blood sugars at home: BID checking at least Foot problems: none Hypoglycemia: none No nausea, vomitting, blurred vision, polyuria.  Lab Results  Component Value Date   HGBA1C 11.9* 03/09/2014   HGBA1C 10.4* 05/19/2012   HGBA1C 10.7* 03/04/2012   Lab Results  Component Value Date   MICROALBUR 1.1 03/09/2014   LDLCALC 72 03/04/2012   CREATININE 0.5 03/09/2014    Wt Readings from Last 3 Encounters:  03/09/14 196 lb (88.905 kg)  02/09/14 193 lb 8 oz (87.771 kg)  01/19/14 191 lb (86.637 kg)    Body mass index is 31.65 kg/(m^2).   200 this morning.  Levemir 32 units twice a day And Metformin 1000 mg bid.   L frozen shoulder: continues to do poorly, actually feels a little bit worse.   Past Medical History, Surgical History, Social History, Family History, Problem List, Medications, and Allergies have been reviewed and updated if relevant.   GEN: No acute illnesses, no fevers, chills. GI: No n/v/d, eating normally Pulm: No SOB Interactive and getting along well at home.  Otherwise, ROS is as per the HPI.  Objective:   BP 112/76  Pulse 99  Temp(Src) 97.9 F (36.6 C) (Oral)  Ht 5\' 6"  (1.676 m)  Wt 196 lb (88.905 kg)  BMI 31.65 kg/m2  SpO2 98%  LMP 02/21/2014  GEN: WDWN, NAD, Non-toxic, A & O x 3 HEENT: Atraumatic, Normocephalic. Neck  supple. No masses, No LAD. Ears and Nose: No external deformity. CV: RRR, No M/G/R. No JVD. No thrill. No extra heart sounds. PULM: CTA B, no wheezes, crackles, rhonchi. No retractions. No resp. distress. No accessory muscle use. EXTR: No c/c/e NEURO Normal gait.  PSYCH: Normally interactive. Conversant. Not depressed or anxious appearing.  Calm demeanor.   No more than 90 deg abd or flexion. Minimal rotation.  Laboratory and Imaging Data: Results for orders placed in visit on 03/09/14  MICROALBUMIN / CREATININE URINE RATIO      Result Value Ref Range   Microalb, Ur 1.1  0.0 - 1.9 mg/dL   Creatinine,U 38.4     Microalb Creat Ratio 2.9  0.0 - 30.0 mg/g  LDL CHOLESTEROL, DIRECT      Result Value Ref Range   Direct LDL 104.2    HEMOGLOBIN A1C      Result Value Ref Range   Hemoglobin A1C 11.9 (*) 4.6 - 6.5 %  BASIC METABOLIC PANEL      Result Value Ref Range   Sodium 136  135 - 145 mEq/L   Potassium 4.2  3.5 - 5.1 mEq/L   Chloride 102  96 - 112 mEq/L   CO2 24  19 - 32 mEq/L   Glucose, Bld 379 (*) 70 - 99 mg/dL   BUN 9  6 - 23 mg/dL   Creatinine, Ser 0.5  0.4 - 1.2 mg/dL   Calcium 8.6  8.4 -  10.5 mg/dL   GFR 133.16  >60.00 mL/min  HEPATIC FUNCTION PANEL      Result Value Ref Range   Total Bilirubin 0.7  0.2 - 1.2 mg/dL   Bilirubin, Direct 0.0  0.0 - 0.3 mg/dL   Alkaline Phosphatase 76  39 - 117 U/L   AST 20  0 - 37 U/L   ALT 17  0 - 35 U/L   Total Protein 7.1  6.0 - 8.3 g/dL   Albumin 3.9  3.5 - 5.2 g/dL  CBC WITH DIFFERENTIAL      Result Value Ref Range   WBC 9.3  4.0 - 10.5 K/uL   RBC 4.91  3.87 - 5.11 Mil/uL   Hemoglobin 14.4  12.0 - 15.0 g/dL   HCT 43.0  36.0 - 46.0 %   MCV 87.6  78.0 - 100.0 fl   MCHC 33.4  30.0 - 36.0 g/dL   RDW 14.3  11.5 - 15.5 %   Platelets 277.0  150.0 - 400.0 K/uL   Neutrophils Relative % 64.9  43.0 - 77.0 %   Lymphocytes Relative 27.3  12.0 - 46.0 %   Monocytes Relative 5.3  3.0 - 12.0 %   Eosinophils Relative 2.1  0.0 - 5.0 %    Basophils Relative 0.4  0.0 - 3.0 %   Neutro Abs 6.0  1.4 - 7.7 K/uL   Lymphs Abs 2.5  0.7 - 4.0 K/uL   Monocytes Absolute 0.5  0.1 - 1.0 K/uL   Eosinophils Absolute 0.2  0.0 - 0.7 K/uL   Basophils Absolute 0.0  0.0 - 0.1 K/uL  TSH      Result Value Ref Range   TSH 0.38  0.35 - 4.50 uIU/mL     Assessment and Plan:   Poorly controlled type 2 diabetes mellitus - Plan: Microalbumin / creatinine urine ratio, Hemoglobin L4J, Basic metabolic panel: doing poorly titrate Levemir up 1 unit daily  Frozen shoulder, left - Plan: Ambulatory referral to Physical Therapy, still in freezing stage. More pain, PT to assist and pain meds if needed and before therapy.  HYPERLIPIDEMIA - Plan: LDL cholesterol, direct  Other malaise and fatigue - Plan: Hepatic function panel, CBC with Differential, TSH  Need for prophylactic vaccination and inoculation against influenza - Plan: Flu Vaccine QUAD 36+ mos IM  Need for prophylactic vaccination against Streptococcus pneumoniae (pneumococcus) - Plan: Pneumococcal conjugate vaccine 13-valent   Follow-up: Return in about 6 months (around 09/07/2014).  New Prescriptions   HYDROCODONE-ACETAMINOPHEN (NORCO/VICODIN) 5-325 MG PER TABLET    Take 1 tablet by mouth every 6 (six) hours as needed for moderate pain.   Orders Placed This Encounter  Procedures  . Flu Vaccine QUAD 36+ mos IM  . Pneumococcal conjugate vaccine 13-valent  . Microalbumin / creatinine urine ratio  . LDL cholesterol, direct  . Hemoglobin A1c  . Basic metabolic panel  . Hepatic function panel  . CBC with Differential  . TSH  . Ambulatory referral to Physical Therapy    Signed,  Frederico Hamman T. Zaeem Kandel, MD   Patient's Medications  New Prescriptions   HYDROCODONE-ACETAMINOPHEN (NORCO/VICODIN) 5-325 MG PER TABLET    Take 1 tablet by mouth every 6 (six) hours as needed for moderate pain.  Previous Medications   IBUPROFEN (ADVIL,MOTRIN) 200 MG TABLET    Take 400 mg by mouth every 6 (six)  hours as needed.   INSULIN DETEMIR (LEVEMIR FLEXPEN) 100 UNIT/ML PEN    Inject 32 Units into the skin 2 (two) times  daily.   INSULIN PEN NEEDLE (PEN NEEDLES 31GX5/16") 31G X 8 MM MISC    1 each by Does not apply route 2 (two) times daily.   METFORMIN (GLUCOPHAGE) 1000 MG TABLET    Take 1,000 mg by mouth 2 (two) times daily with a meal.   NAPROXEN (NAPROSYN) 500 MG TABLET    Take 1 tablet (500 mg total) by mouth 2 (two) times daily.  Modified Medications   No medications on file  Discontinued Medications   No medications on file

## 2014-04-05 ENCOUNTER — Other Ambulatory Visit: Payer: Self-pay | Admitting: Family Medicine

## 2014-04-05 MED ORDER — HYDROCODONE-ACETAMINOPHEN 5-325 MG PO TABS: 1.0000 | ORAL_TABLET | Freq: Four times a day (QID) | ORAL | Status: DC | PRN

## 2014-04-05 NOTE — Telephone Encounter (Signed)
Prescription printed and placed in Dr. Lillie Fragmin in box for signature.

## 2014-04-05 NOTE — Telephone Encounter (Signed)
Would you mind printing and putting in my box?

## 2014-04-06 ENCOUNTER — Other Ambulatory Visit: Payer: Self-pay | Admitting: *Deleted

## 2014-04-06 MED ORDER — INSULIN DETEMIR 100 UNIT/ML FLEXPEN: 32.0000 [IU] | PEN_INJECTOR | Freq: Two times a day (BID) | SUBCUTANEOUS | Status: DC

## 2014-04-06 NOTE — Telephone Encounter (Signed)
MyChart message and voicemail left for Lindsey Mason that prescription is ready to be picked up at front desk.

## 2014-05-09 ENCOUNTER — Ambulatory Visit (INDEPENDENT_AMBULATORY_CARE_PROVIDER_SITE_OTHER): Payer: No Typology Code available for payment source | Admitting: Family Medicine

## 2014-05-09 ENCOUNTER — Encounter: Payer: Self-pay | Admitting: Family Medicine

## 2014-05-09 VITALS — BP 108/70 | HR 99 | Temp 97.9°F | Ht 66.0 in | Wt 199.0 lb

## 2014-05-09 DIAGNOSIS — E119 Type 2 diabetes mellitus without complications: Secondary | ICD-10-CM

## 2014-05-09 DIAGNOSIS — E1165 Type 2 diabetes mellitus with hyperglycemia: Secondary | ICD-10-CM

## 2014-05-09 DIAGNOSIS — M7501 Adhesive capsulitis of right shoulder: Secondary | ICD-10-CM

## 2014-05-09 MED ORDER — DICLOFENAC SODIUM 75 MG PO TBEC: 75.0000 mg | DELAYED_RELEASE_TABLET | Freq: Two times a day (BID) | ORAL | Status: DC

## 2014-05-09 NOTE — Progress Notes (Signed)
Pre visit review using our clinic review tool, if applicable. No additional management support is needed unless otherwise documented below in the visit note. 

## 2014-05-09 NOTE — Progress Notes (Signed)
Dr. Frederico Hamman T. Edell Mesenbrink, MD, [facility] [place], [address] Phone: [phone] Fax: [phone]  05/09/2014  Patient: Lindsey Mason, MRN: [id number], DOB: 1970-07-26, 43 y.o.  Primary Physician:  Owens Loffler, MD  Chief Complaint: Follow-up  Subjective:   Lindsey Mason is a 43 y.o. very pleasant female patient who presents with the following:  Shoulder is most what on her mind.  She has had significant frozen shoulder for approximately 7 or 8 months, and this is frustrating for the patient.  Her pain is started to improve, and it seems as if she may have started in her the thawing phase.  Her motion has improved.  She has been working on doing range of motion, and she is using some pulleys to work on her range of motion also.  BS: 50 units of Levmir twice a day.  The patient does a good job checking her blood sugar, and essentially at all times it is greater than 200.  She has had a few less than 200.  Now she is taking Levemir 50 units twice a day.  She is also taking metformin 1000 mg twice a day.  She did have an extended time her diabetes was very poorly controlled when she lost her medical insurance and she did not have any insulin.  Lab Results  Component Value Date   HGBA1C 11.9* 03/09/2014   HGBA1C 10.4* 05/19/2012   HGBA1C 10.7* 03/04/2012   Lab Results  Component Value Date   MICROALBUR 1.1 03/09/2014   LDLCALC 72 03/04/2012   CREATININE 0.5 03/09/2014   Wt Readings from Last 3 Encounters:  05/09/14 199 lb (90.266 kg)  03/09/14 196 lb (88.905 kg)  02/09/14 193 lb 8 oz (87.771 kg)   Body mass index is 32.13 kg/(m^2).    Past Medical History, Surgical History, Social History, Family History, Problem List, Medications, and Allergies have been reviewed and updated if relevant.   GEN: No acute illnesses, no fevers, chills. GI: No n/v/d, eating normally Pulm: No SOB Interactive and getting along  well at home.  Otherwise, ROS is as per the HPI.  Objective:   BP 108/70 mmHg  Pulse 99  Temp(Src) 97.9 F (36.6 C) (Oral)  Ht 5\' 6"  (1.676 m)  Wt 199 lb (90.266 kg)  BMI 32.13 kg/m2  LMP 04/04/2014 (Approximate)  GEN: WDWN, NAD, Non-toxic, A & O x 3 HEENT: Atraumatic, Normocephalic. Neck supple. No masses, No LAD. Ears and Nose: No external deformity. CV: RRR, No M/G/R. No JVD. No thrill. No extra heart sounds. PULM: CTA B, no wheezes, crackles, rhonchi. No retractions. No resp. distress. No accessory muscle use. EXTR: No c/c/e NEURO Normal gait.  PSYCH: Normally interactive. Conversant. Not depressed or anxious appearing.  Calm demeanor.   RIGHT shoulder nontender along the clavicle.  Mildly tender at the acromioclavicular joint.  Mildly tender in the bicipital groove.  Abduction to 110.  Flexion to 115.  Limited with internal range of motion.  External range of motion is approximately 40.  She has been improving, her strength still is preserved.  5/5 in all directions.  Laboratory and Imaging Data: Lab Results  Component Value Date   WBC 9.3 03/09/2014   HGB 14.4 03/09/2014   HCT 43.0 03/09/2014   PLT 277.0 03/09/2014   GLUCOSE 379* 03/09/2014   CHOL 137 03/04/2012   TRIG 119.0 03/04/2012   HDL 41.20 03/04/2012   LDLDIRECT 104.2 03/09/2014  LDLCALC 72 03/04/2012   ALT 17 03/09/2014   AST 20 03/09/2014   NA 136 03/09/2014   K 4.2 03/09/2014   CL 102 03/09/2014   CREATININE 0.5 03/09/2014   BUN 9 03/09/2014   CO2 24 03/09/2014   TSH 0.38 03/09/2014   HGBA1C 11.9* 03/09/2014   MICROALBUR 1.1 03/09/2014     Assessment and Plan:   Adhesive capsulitis of right shoulder  Poorly controlled type 2 diabetes mellitus - Plan: Ambulatory referral to Endocrinology  I am fairly concerned about the patient's diabetes.  Her blood sugar levels remain quite high with high levels of insulin and she continues on metformin 2000 mg.  I think that she will do better with  involvement of endocrinology.  I appreciate their assistance.  The patient has been historically quite compliant, with some financial limitation.  No concern regarding the patient's frozen shoulder.  She is essentially where I would expect her to be, with a less than optimal return to full motion at this point.  She is not at a point where consideration of manipulation under anesthesia or a capsular release would be appropriate.  Continue with home rehabilitation and follow-up with me in 3 months.  Follow-up: Return in about 3 months (around 08/09/2014).  New Prescriptions   DICLOFENAC (VOLTAREN) 75 MG EC TABLET    Take 1 tablet (75 mg total) by mouth 2 (two) times daily.   Orders Placed This Encounter  Procedures  . Ambulatory referral to Endocrinology    Signed,  Maud Deed. Shayda Kalka, MD   Patient's Medications  New Prescriptions   DICLOFENAC (VOLTAREN) 75 MG EC TABLET    Take 1 tablet (75 mg total) by mouth 2 (two) times daily.  Previous Medications   HYDROCODONE-ACETAMINOPHEN (NORCO/VICODIN) 5-325 MG PER TABLET    Take 1 tablet by mouth every 6 (six) hours as needed for moderate pain.   IBUPROFEN (ADVIL,MOTRIN) 200 MG TABLET    Take 400 mg by mouth every 6 (six) hours as needed.   INSULIN DETEMIR (LEVEMIR FLEXPEN) 100 UNIT/ML PEN    Inject 50 Units into the skin 2 (two) times daily.   INSULIN PEN NEEDLE (PEN NEEDLES 31GX5/16") 31G X 8 MM MISC    1 each by Does not apply route 2 (two) times daily.   METFORMIN (GLUCOPHAGE) 1000 MG TABLET    TAKE 1 TABLET TWICE DAILY WITH FOOD  Modified Medications   No medications on file  Discontinued Medications   INSULIN DETEMIR (LEVEMIR FLEXPEN) 100 UNIT/ML PEN    Inject 32 Units into the skin 2 (two) times daily.   NAPROXEN (NAPROSYN) 500 MG TABLET    Take 1 tablet (500 mg total) by mouth 2 (two) times daily.

## 2014-05-09 NOTE — Patient Instructions (Signed)

## 2014-05-19 ENCOUNTER — Ambulatory Visit: Payer: Self-pay | Admitting: Internal Medicine

## 2014-05-25 ENCOUNTER — Encounter: Payer: Self-pay | Admitting: Internal Medicine

## 2014-05-25 ENCOUNTER — Ambulatory Visit (INDEPENDENT_AMBULATORY_CARE_PROVIDER_SITE_OTHER): Payer: No Typology Code available for payment source | Admitting: Internal Medicine

## 2014-05-25 ENCOUNTER — Other Ambulatory Visit: Payer: Self-pay | Admitting: *Deleted

## 2014-05-25 VITALS — BP 118/62 | HR 107 | Temp 98.6°F | Resp 12 | Ht 67.0 in | Wt 193.8 lb

## 2014-05-25 DIAGNOSIS — E1165 Type 2 diabetes mellitus with hyperglycemia: Secondary | ICD-10-CM

## 2014-05-25 DIAGNOSIS — E119 Type 2 diabetes mellitus without complications: Secondary | ICD-10-CM

## 2014-05-25 MED ORDER — INSULIN PEN NEEDLE 31G X 5 MM MISC: Status: DC

## 2014-05-25 MED ORDER — INSULIN DETEMIR 100 UNIT/ML FLEXPEN: 40.0000 [IU] | PEN_INJECTOR | Freq: Two times a day (BID) | SUBCUTANEOUS | Status: DC

## 2014-05-25 MED ORDER — INSULIN ASPART 100 UNIT/ML FLEXPEN: 8.0000 [IU] | PEN_INJECTOR | Freq: Three times a day (TID) | SUBCUTANEOUS | Status: DC

## 2014-05-25 NOTE — Progress Notes (Signed)
Patient ID: Lindsey Mason, female   DOB: 06-01-1971, 43 y.o.   MRN: [id number]  HPI: Lindsey Mason is a 43 y.o.-year-old female, referred by her PCP, Dr. Lorelei Pont, for management of DM2 (initially GDM with 3 pregnancy), dx 2005,  insulin-dependent since 2013, uncontrolled, without complications.  Last hemoglobin A1c was: Lab Results  Component Value Date   HGBA1C 11.9* 03/09/2014   HGBA1C 10.4* 05/19/2012   HGBA1C 10.7* 03/04/2012  She did not have insurance this summer >> only took Metformin.  She had a steroid inj in 03/2014 (shoulder).  Pt is on a regimen of: - Metformin 1000 mg po bid - Levemir 50 units bid  Tried Glyburide in the past. She was on Lantus 55 units 2x a day >> HA  Pt checks her sugars 1-2 a day and they are: - am: 200-300 - 2h after b'fast: n/c - before lunch: n/c - 2h after lunch: 200-300 - before dinner: n/c - 2h after dinner: n/c - bedtime: n/c - nighttime: n/c No lows. Lowest sugar was 185;? if she has hypoglycemia awareness. Highest sugar was 400s.  Pt's meals are: - Breakfast: biscuit - egg sausage, coffee sugar free creamer and stevia - Lunch: may miss: crackers, pretzels, granola/cereal bars - Dinner: meat + 2 veggies  - Snacks: no Coke zero -  1 a day. No exercise.  - no CKD, last BUN/creatinine:  Lab Results  Component Value Date   BUN 9 03/09/2014   CREATININE 0.5 03/09/2014  Not on ACEI/ARB. - last set of lipids: Lab Results  Component Value Date   CHOL 137 03/04/2012   HDL 41.20 03/04/2012   LDLCALC 72 03/04/2012   LDLDIRECT 104.2 03/09/2014   TRIG 119.0 03/04/2012   CHOLHDL 3 03/04/2012  Not on a statin. - last eye exam was in 10/2013. No DR.  - no numbness and tingling in her feet. No neuropathy.  Pt has FH of DM in mother and father.  She has a h/o PCOS.   ROS: Constitutional:+ weight loss, + fatigue, + hot flushes, + increased urination Eyes: no blurry vision, no xerophthalmia ENT: + sore throat, no nodules  palpated in throat, + dysphagia/no odynophagia, no hoarseness Cardiovascular: no CP/SOB/palpitations/+ leg swelling Respiratory: no cough/SOB Gastrointestinal: no N/V/+D/no C Musculoskeletal: + both: muscle/joint aches Skin: no rashes, + hair loss Neurological: no tremors/numbness/tingling/dizziness Psychiatric: + depression/no anxiety + irreg mense Past Medical History  Diagnosis Date  . Headache(784.0)   . Esophageal reflux   . Diabetes mellitus without mention of complication   . Pure hypercholesterolemia   . Rosacea   . PCOS (polycystic ovarian syndrome) 05/27/2012   Past Surgical History  Procedure Laterality Date  . Cesarean section      triplets and singles  . Appendectomy    . Tubal ligation     History   Social History  . Marital Status: Married    Spouse Name: N/A    Number of Children: 5   Occupational History  . music minister    Social History Main Topics  . Smoking status: Former Smoker    Types: Cigarettes    Quit date: 03/04/2004  . Smokeless tobacco: Never Used  . Alcohol Use: No     Comment: occ, 1x a mo - wine  . Drug Use: No   Current Outpatient Prescriptions on File Prior to Visit  Medication Sig Dispense Refill  . diclofenac (VOLTAREN) 75 MG EC tablet Take 1 tablet (75 mg total) by mouth 2 (two) times  daily. 60 tablet 3  . HYDROcodone-acetaminophen (NORCO/VICODIN) 5-325 MG per tablet Take 1 tablet by mouth every 6 (six) hours as needed for moderate pain. 40 tablet 0  . ibuprofen (ADVIL,MOTRIN) 200 MG tablet Take 400 mg by mouth every 6 (six) hours as needed.    . Insulin Detemir (LEVEMIR FLEXPEN) 100 UNIT/ML Pen Inject 50 Units into the skin 2 (two) times daily.    . Insulin Pen Needle (PEN NEEDLES 31GX5/16") 31G X 8 MM MISC 1 each by Does not apply route 2 (two) times daily.    . metFORMIN (GLUCOPHAGE) 1000 MG tablet TAKE 1 TABLET TWICE DAILY WITH FOOD 60 tablet 5   No current facility-administered medications on file prior to visit.   No  Known Allergies Family History  Problem Relation Age of Onset  . Cancer Mother     breast  . Heart disease Mother   . Stroke Mother    PE: BP 118/62 mmHg  Pulse 107  Temp(Src) 98.6 F (37 C) (Oral)  Resp 12  Ht 5\' 7"  (1.702 m)  Wt 193 lb 12.8 oz (87.907 kg)  BMI 30.35 kg/m2  SpO2 97%  LMP 04/04/2014 (Approximate) Wt Readings from Last 3 Encounters:  05/25/14 193 lb 12.8 oz (87.907 kg)  05/09/14 199 lb (90.266 kg)  03/09/14 196 lb (88.905 kg)   Constitutional: overweight, in NAD Eyes: PERRLA, EOMI, no exophthalmos ENT: moist mucous membranes, no thyromegaly, no cervical lymphadenopathy Cardiovascular: tachycardia, RR, No MRG Respiratory: CTA B Gastrointestinal: abdomen soft, NT, ND, BS+ Musculoskeletal: no deformities, strength intact in all 4 Skin: moist, warm, no rashes Neurological: no tremor with outstretched hands, DTR normal in all 4  ASSESSMENT: 1. DM2, insulin-dependent, uncontrolled, without complications  PLAN:  1. Patient with long-standing, uncontrolled diabetes, on oral antidiabetic med + large dose of basal insulin, which became insufficient. - We discussed about options for treatment, and I suggested to:  Patient Instructions  Please decrease Levemir to 40 units 2x a day. Start NovoLog rapid-acting insulin 15 min before meals. - 8 units before a small meal - 12 units before a regular meal - 16 units before a large meal (if you go out to eat, eat dessert or have Thanksgiving dinner!) Continue Metformin 1000 mg 2x a day. Please return in 1 month with your sugar log.  - we may need a VGo in the future - Strongly advised her to start checking sugars at different times of the day - check 3 times a day, rotating checks - given sugar log and advised how to fill it and to bring it at next appt  - given foot care handout and explained the principles  - given instructions for hypoglycemia management "15-15 rule"  - advised for yearly eye exams >> she is UTD -  had flu and PNA vaccine this season - Return to clinic in 1 mo with sugar log

## 2014-05-25 NOTE — Telephone Encounter (Signed)
Pt called back and requested a refill of her Levemir.

## 2014-05-25 NOTE — Patient Instructions (Signed)
Please decrease Levemir to 40 units 2x a day. Start NovoLog rapid-acting insulin 15 min before meals. - 8 units before a small meal - 12 units before a regular meal - 16 units before a large meal (if you go out to eat, eat dessert or have Thanksgiving dinner!) Continue Metformin 1000 mg 2x a day.  Please return in 1 month with your sugar log.   PATIENT INSTRUCTIONS FOR TYPE 2 DIABETES:  **Please join MyChart!** - see attached instructions about how to join if you have not done so already.  DIET AND EXERCISE Diet and exercise is an important part of diabetic treatment.  We recommended aerobic exercise in the form of brisk walking (working between 40-60% of maximal aerobic capacity, similar to brisk walking) for 150 minutes per week (such as 30 minutes five days per week) along with 3 times per week performing 'resistance' training (using various gauge rubber tubes with handles) 5-10 exercises involving the major muscle groups (upper body, lower body and core) performing 10-15 repetitions (or near fatigue) each exercise. Start at half the above goal but build slowly to reach the above goals. If limited by weight, joint pain, or disability, we recommend daily walking in a swimming pool with water up to waist to reduce pressure from joints while allow for adequate exercise.    BLOOD GLUCOSES Monitoring your blood glucoses is important for continued management of your diabetes. Please check your blood glucoses 2-4 times a day: fasting, before meals and at bedtime (you can rotate these measurements - e.g. one day check before the 3 meals, the next day check before 2 of the meals and before bedtime, etc.).   HYPOGLYCEMIA (low blood sugar) Hypoglycemia is usually a reaction to not eating, exercising, or taking too much insulin/ other diabetes drugs.  Symptoms include tremors, sweating, hunger, confusion, headache, etc. Treat IMMEDIATELY with 15 grams of Carbs: . 4 glucose tablets .  cup regular  juice/soda . 2 tablespoons raisins . 4 teaspoons sugar . 1 tablespoon honey Recheck blood glucose in 15 mins and repeat above if still symptomatic/blood glucose <100.  RECOMMENDATIONS TO REDUCE YOUR RISK OF DIABETIC COMPLICATIONS: * Take your prescribed MEDICATION(S) * Follow a DIABETIC diet: Complex carbs, fiber rich foods, (monounsaturated and polyunsaturated) fats * AVOID saturated/trans fats, high fat foods, >2,300 mg salt per day. * EXERCISE at least 5 times a week for 30 minutes or preferably daily.  * DO NOT SMOKE OR DRINK more than 1 drink a day. * Check your FEET every day. Do not wear tightfitting shoes. Contact us if you develop an ulcer * See your EYE doctor once a year or more if needed * Get a FLU shot once a year * Get a PNEUMONIA vaccine once before and once after age 77 years  GOALS:  * Your Hemoglobin A1c of <7%  * fasting sugars need to be <130 * after meals sugars need to be <180 (2h after you start eating) * Your Systolic BP should be 947 or lower  * Your Diastolic BP should be 80 or lower  * Your HDL (Good Cholesterol) should be 40 or higher  * Your LDL (Bad Cholesterol) should be 100 or lower. * Your Triglycerides should be 150 or lower  * Your Urine microalbumin (kidney function) should be <30 * Your Body Mass Index should be 25 or lower    Please consider the following ways to cut down carbs and fat and increase fiber and micronutrients in your diet: - substitute  whole grain for white bread or pasta - substitute brown rice for white rice - substitute 90-calorie flat bread pieces for slices of bread when possible - substitute sweet potatoes or yams for white potatoes - substitute humus for margarine - substitute tofu for cheese when possible - substitute almond or rice milk for regular milk (would not drink soy milk daily due to concern for soy estrogen influence on breast cancer risk) - substitute dark chocolate for other sweets when possible -  substitute water - can add lemon or orange slices for taste - for diet sodas (artificial sweeteners will trick your body that you can eat sweets without getting calories and will lead you to overeating and weight gain in the long run) - do not skip breakfast or other meals (this will slow down the metabolism and will result in more weight gain over time)  - can try smoothies made from fruit and almond/rice milk in am instead of regular breakfast - can also try old-fashioned (not instant) oatmeal made with almond/rice milk in am - order the dressing on the side when eating salad at a restaurant (pour less than half of the dressing on the salad) - eat as little meat as possible - can try juicing, but should not forget that juicing will get rid of the fiber, so would alternate with eating raw veg./fruits or drinking smoothies - use as little oil as possible, even when using olive oil - can dress a salad with a mix of balsamic vinegar and lemon juice, for e.g. - use agave nectar, stevia sugar, or regular sugar rather than artificial sweateners - steam or broil/roast veggies  - snack on veggies/fruit/nuts (unsalted, preferably) when possible, rather than processed foods - reduce or eliminate aspartame in diet (it is in diet sodas, chewing gum, etc) Read the labels!  Try to read Dr. Janene Harvey book: "Program for Reversing Diabetes" for other ideas for healthy eating.

## 2014-06-28 ENCOUNTER — Ambulatory Visit: Payer: Self-pay | Admitting: Internal Medicine

## 2014-08-10 ENCOUNTER — Ambulatory Visit: Payer: Self-pay | Admitting: Family Medicine

## 2014-11-04 ENCOUNTER — Ambulatory Visit (INDEPENDENT_AMBULATORY_CARE_PROVIDER_SITE_OTHER): Payer: 59 | Admitting: Family Medicine

## 2014-11-04 ENCOUNTER — Encounter: Payer: Self-pay | Admitting: Family Medicine

## 2014-11-04 VITALS — BP 126/70 | HR 80 | Temp 97.9°F | Wt 192.2 lb

## 2014-11-04 DIAGNOSIS — E119 Type 2 diabetes mellitus without complications: Secondary | ICD-10-CM

## 2014-11-04 DIAGNOSIS — J0100 Acute maxillary sinusitis, unspecified: Secondary | ICD-10-CM | POA: Insufficient documentation

## 2014-11-04 DIAGNOSIS — J019 Acute sinusitis, unspecified: Secondary | ICD-10-CM

## 2014-11-04 DIAGNOSIS — E1165 Type 2 diabetes mellitus with hyperglycemia: Secondary | ICD-10-CM

## 2014-11-04 MED ORDER — AMOXICILLIN-POT CLAVULANATE 875-125 MG PO TABS: 1.0000 | ORAL_TABLET | Freq: Two times a day (BID) | ORAL | Status: AC

## 2014-11-04 NOTE — Assessment & Plan Note (Signed)
Story suspicious for infection - treat with augmentin antibiotic. Stop keflex. encouraged she finish antibiotics. Supportive care as per instructions. Red flags to return discussed. Update if not improving with treatment.

## 2014-11-04 NOTE — Patient Instructions (Addendum)
I am suspicious for sinus infection - take augmentin course - finish antibiotics Push fluids and rest. ibuprofen 400-600mg  with food for inflammation. Let us know if not improving as expected, or if any fever >101, worsening cough, or worsening after initial improvement.

## 2014-11-04 NOTE — Progress Notes (Signed)
BP 126/70 mmHg  Pulse 80  Temp(Src) 97.9 F (36.6 C) (Oral)  Wt 192 lb 4 oz (87.204 kg)  LMP 10/14/2014   CC: cough  Subjective:    Patient ID: Lindsey Mason, female    DOB: [birth year], 44 y.o.   MRN: [id number]  HPI: Alonzo Loving is a 44 y.o. female presenting on 11/04/2014 for Sinusitis   Several week history of cold sxs (cough, PNdrainage). Cough now better. 1 wk ago noticed R sided facial and ear pain as well as maxillary pressure/clicking, initially thought dental related, but dentist didn't see any issues. No nasal congestion. Persistent PNdrainage. + headaches and nausea. She does clench her teeth.   No fevers/chills, abd pain, ST, hoarseness, cough.   Daughter sick recently.  No smokers at home.  No h/o asthma.   Had left over keflex from prior illness and she started taking 3d ago (Rx was several years old). Antibiotics seem to be helping - pain not as severe.   Insulin dependent diabetes, uncontrolled. Established with Dr Cruzita Lederer but never returned for f/u. She has appt scheduled for next week  Lab Results  Component Value Date   HGBA1C 11.9* 03/09/2014    No insurance until this Monday.  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 Interim medical history since our last visit reviewed. Allergies and medications reviewed and updated. Current Outpatient Prescriptions on File Prior to Visit  Medication Sig  . diclofenac (VOLTAREN) 75 MG EC tablet Take 1 tablet (75 mg total) by mouth 2 (two) times daily.  [name] ibuprofen (ADVIL,MOTRIN) 200 MG tablet Take 400 mg by mouth every 6 (six) hours as needed.  . insulin aspart (NOVOLOG FLEXPEN) 100 UNIT/ML FlexPen Inject 8-16 Units into the skin 3 (three) times daily with meals.  . Insulin Detemir (LEVEMIR FLEXPEN) 100 UNIT/ML Pen Inject 40 Units into the skin 2 (two) times daily.  . Insulin Pen  Needle (FIFTY50 PEN NEEDLES) 31G X 5 MM MISC Use 5x a day  . metFORMIN (GLUCOPHAGE) 1000 MG tablet TAKE 1 TABLET TWICE DAILY WITH FOOD   No current facility-administered medications on file prior to visit.    Review of Systems Per HPI unless specifically indicated above     Objective:    BP 126/70 mmHg  Pulse 80  Temp(Src) 97.9 F (36.6 C) (Oral)  Wt 192 lb 4 oz (87.204 kg)  LMP 10/14/2014  Wt Readings from Last 3 Encounters:  11/04/14 192 lb 4 oz (87.204 kg)  05/25/14 193 lb 12.8 oz (87.907 kg)  05/09/14 199 lb (90.266 kg)    Physical Exam  Constitutional: She appears well-developed and well-nourished. No distress.  HENT:  Head: Normocephalic and atraumatic.  Right Ear: Hearing, external ear and ear canal normal.  Left Ear: Hearing, external ear and ear canal normal.  Nose: Mucosal edema present. No rhinorrhea. Right sinus exhibits maxillary sinus tenderness and frontal sinus tenderness. Left sinus exhibits no maxillary sinus tenderness and no frontal sinus tenderness.  Mouth/Throat: Uvula is midline, oropharynx is clear and moist and mucous membranes are normal. No oropharyngeal exudate, posterior oropharyngeal edema, posterior oropharyngeal erythema or tonsillar abscesses.  Cerumen impaction  Eyes: Conjunctivae and EOM are normal. Pupils are equal, round, and reactive to light. No scleral icterus.  Neck: Normal range of motion. Neck supple.  Cardiovascular: Normal rate, regular rhythm, normal heart sounds and intact distal pulses.  No murmur heard. Pulmonary/Chest: Effort normal and breath sounds normal. No respiratory distress. She has no wheezes. She has no rales.  Lymphadenopathy:    She has cervical adenopathy (R submandibular).  Skin: Skin is warm and dry. No rash noted.  Nursing note and vitals reviewed.     Assessment & Plan:   Problem List Items Addressed This Visit    Poorly controlled type 2 diabetes mellitus    Pt has upcoming appt with endo      Acute  sinusitis - Primary    Story suspicious for infection - treat with augmentin antibiotic. Stop keflex. encouraged she finish antibiotics. Supportive care as per instructions. Red flags to return discussed. Update if not improving with treatment.      Relevant Medications   amoxicillin-clavulanate (AUGMENTIN) 875-125 MG per tablet       Follow up plan: Return if symptoms worsen or fail to improve.

## 2014-11-04 NOTE — Assessment & Plan Note (Signed)
Pt has upcoming appt with endo

## 2014-11-04 NOTE — Progress Notes (Signed)
Pre visit review using our clinic review tool, if applicable. No additional management support is needed unless otherwise documented below in the visit note. 

## 2014-11-10 ENCOUNTER — Ambulatory Visit: Payer: Self-pay | Admitting: Internal Medicine

## 2014-11-11 ENCOUNTER — Ambulatory Visit (INDEPENDENT_AMBULATORY_CARE_PROVIDER_SITE_OTHER): Payer: 59 | Admitting: Internal Medicine

## 2014-11-11 ENCOUNTER — Encounter: Payer: Self-pay | Admitting: Internal Medicine

## 2014-11-11 ENCOUNTER — Other Ambulatory Visit: Payer: Self-pay | Admitting: *Deleted

## 2014-11-11 VITALS — BP 108/62 | HR 93 | Temp 97.9°F | Resp 12 | Wt 189.6 lb

## 2014-11-11 DIAGNOSIS — E119 Type 2 diabetes mellitus without complications: Secondary | ICD-10-CM

## 2014-11-11 DIAGNOSIS — E1165 Type 2 diabetes mellitus with hyperglycemia: Secondary | ICD-10-CM

## 2014-11-11 LAB — HEMOGLOBIN A1C: Hgb A1c MFr Bld: 11.9 % — ABNORMAL HIGH (ref 4.6–6.5)

## 2014-11-11 MED ORDER — INSULIN PEN NEEDLE 31G X 5 MM MISC: Status: DC

## 2014-11-11 MED ORDER — INSULIN ASPART 100 UNIT/ML FLEXPEN: 8.0000 [IU] | PEN_INJECTOR | Freq: Three times a day (TID) | SUBCUTANEOUS | Status: DC

## 2014-11-11 MED ORDER — INSULIN DETEMIR 100 UNIT/ML FLEXPEN: 40.0000 [IU] | PEN_INJECTOR | Freq: Two times a day (BID) | SUBCUTANEOUS | Status: DC

## 2014-11-11 NOTE — Progress Notes (Signed)
Patient ID: Lindsey Mason, female   DOB: 24-Feb-1971, 44 y.o.   MRN: [id number]  HPI: Lindsey Mason is a 44 y.o.-year-old female, returning for f/u for DM2 (initially GDM with 3 pregnancy), dx 2005,  insulin-dependent since 2013, uncontrolled, without complications. Last visit 6 mo ago. She did not have insurance for a while. She has [facility].  Last hemoglobin A1c was: Lab Results  Component Value Date   HGBA1C 11.9* 03/09/2014   HGBA1C 10.4* 05/19/2012   HGBA1C 10.7* 03/04/2012  She had a steroid inj in 03/2014 (shoulder).  Pt is on a regimen of: - Metformin 1000 mg 2x a day - Lantus 40 units 2x a day - rationing the insulin - was using a friend's supply of Lantus - NovoLog rapid-acting insulin 15 min before meals.- added 05/2014 - 8 units before a small meal - 12 units before a regular meal - 16 units before a large meal Tried Glyburide in the past. She was on Lantus 55 units 2x a day >> Lantus gives her headaches!  Pt  does not her sugars - did not have strips. - am: 200-300  - 2h after b'fast: n/c - before lunch: n/c - 2h after lunch: 200-300 - before dinner: n/c - 2h after dinner: n/c - bedtime: n/c - nighttime: n/c No lows. ? if she has hypoglycemia awareness.  Pt's meals are: - Breakfast: biscuit - egg sausage, coffee sugar free creamer and stevia - Lunch: may miss: crackers, pretzels, granola/cereal bars - Dinner: meat + 2 veggies  - Snacks: no Coke zero -  1 a day. No exercise.  - no CKD, last BUN/creatinine:  Lab Results  Component Value Date   BUN 9 03/09/2014   CREATININE 0.5 03/09/2014  Not on ACEI/ARB. - last set of lipids: Lab Results  Component Value Date   CHOL 137 03/04/2012   HDL 41.20 03/04/2012   LDLCALC 72 03/04/2012   LDLDIRECT 104.2 03/09/2014   TRIG 119.0 03/04/2012   CHOLHDL 3 03/04/2012  Not on a statin. - last eye exam was in 10/2013. No DR.  - no numbness and tingling in her feet. No neuropathy.  She has a h/o PCOS.    ROS: Constitutional:+ weight loss, no fatigue, no  hot flushes, + increased urination (at night) Eyes: no blurry vision, no xerophthalmia ENT: + sore throat, no nodules palpated in throat, no dysphagia/no odynophagia, no hoarseness Cardiovascular: no CP/SOB/palpitations/leg swelling Respiratory: no cough/SOB Gastrointestinal: no N/V/+D/no C Musculoskeletal: no muscle/joint aches Skin: no rashes, no  hair loss Neurological: no tremors/numbness/tingling/dizziness, + HA with Lantus (was using a friend's supply of Lantus)  I reviewed pt's medications, allergies, PMH, social hx, family hx, and changes were documented in the history of present illness. Otherwise, unchanged from my initial visit note. Past Medical History  Diagnosis Date  . Headache(784.0)   . Esophageal reflux   . Diabetes mellitus without mention of complication   . Pure hypercholesterolemia   . Rosacea   . PCOS (polycystic ovarian syndrome) 05/27/2012   Past Surgical History  Procedure Laterality Date  . Cesarean section      triplets and singles  . Appendectomy    . Tubal ligation     History   Social History  . Marital Status: Married    Spouse Name: N/A    Number of Children: 5   Occupational History  . music minister    Social History Main Topics  . Smoking status: Former Smoker    Types: Cigarettes  Quit date: 03/04/2004  . Smokeless tobacco: Never Used  . Alcohol Use: No     Comment: occ, 1x a mo - wine  . Drug Use: No   Current Outpatient Prescriptions on File Prior to Visit  Medication Sig Dispense Refill  . ibuprofen (ADVIL,MOTRIN) 200 MG tablet Take 400 mg by mouth every 6 (six) hours as needed.    . insulin aspart (NOVOLOG FLEXPEN) 100 UNIT/ML FlexPen Inject 8-16 Units into the skin 3 (three) times daily with meals. (Patient taking differently: Inject 12 Units into the skin 3 (three) times daily with meals. ) 15 mL 2  . Insulin Pen Needle (FIFTY50 PEN NEEDLES) 31G X 5 MM MISC Use 5x a  day 200 each 11  . metFORMIN (GLUCOPHAGE) 1000 MG tablet TAKE 1 TABLET TWICE DAILY WITH FOOD 60 tablet 5  . amoxicillin-clavulanate (AUGMENTIN) 875-125 MG per tablet Take 1 tablet by mouth 2 (two) times daily. (Patient not taking: Reported on 11/11/2014) 20 tablet 0  . diclofenac (VOLTAREN) 75 MG EC tablet Take 1 tablet (75 mg total) by mouth 2 (two) times daily. (Patient not taking: Reported on 11/11/2014) 60 tablet 3  . Insulin Detemir (LEVEMIR FLEXPEN) 100 UNIT/ML Pen Inject 40 Units into the skin 2 (two) times daily. (Patient not taking: Reported on 11/11/2014) 15 mL 2   No current facility-administered medications on file prior to visit.   No Known Allergies Family History  Problem Relation Age of Onset  . Cancer Mother     breast  . Heart disease Mother   . Stroke Mother    PE: BP 108/62 mmHg  Pulse 93  Temp(Src) 97.9 F (36.6 C) (Oral)  Resp 12  Wt 189 lb 9.6 oz (86.002 kg)  SpO2 98%  LMP 10/14/2014 Wt Readings from Last 3 Encounters:  11/11/14 189 lb 9.6 oz (86.002 kg)  11/04/14 192 lb 4 oz (87.204 kg)  05/25/14 193 lb 12.8 oz (87.907 kg)   Constitutional: overweight, in NAD Eyes: PERRLA, EOMI, no exophthalmos ENT: moist mucous membranes, no thyromegaly, no cervical lymphadenopathy Cardiovascular: tachycardia, RR, No MRG Respiratory: CTA B Gastrointestinal: abdomen soft, NT, ND, BS+ Musculoskeletal: no deformities, strength intact in all 4 Skin: moist, warm, no rashes Neurological: no tremor with outstretched hands, DTR normal in all 4  ASSESSMENT: 1. DM2, insulin-dependent, uncontrolled, without complications  PLAN:  1. Patient with long-standing, uncontrolled diabetes, returning after a long absence, during which she did not have insurance and was rationing her meds while not checking sugars... She now has insurance and would like to get back on track with her DM.  I advised her to get a ReliOn meter >> check sugars 3x a day. Restart previous regimen:  Patient  Instructions  Please decrease Levemir to 40 units 2x a day.  Start NovoLog rapid-acting insulin 15 min before meals. - 8 units before a small meal - 12 units before a regular meal - 16 units before a large meal   Continue Metformin 1000 mg 2x a day.  Please return in 2 months with your sugar log.   Please stop at the lab  - continue checking sugars at different times of the day - check 3 times a day, rotating checks - advised for yearly eye exams >> she is UTD - check HbA1c - Return to clinic in 2 mo with sugar log   Office Visit on 11/11/2014  Component Date Value Ref Range Status  . Hgb A1c MFr Bld 11/11/2014 11.9* 4.6 - 6.5 % Final  Glycemic Control Guidelines for People with Diabetes:Non Diabetic:  <6%Goal of Therapy: <7%Additional Action Suggested:  >8%    HbA1c very high!

## 2014-11-11 NOTE — Patient Instructions (Signed)
Please decrease Levemir to 40 units 2x a day.  Start NovoLog rapid-acting insulin 15 min before meals. - 8 units before a small meal - 12 units before a regular meal - 16 units before a large meal   Continue Metformin 1000 mg 2x a day.  Please return in 2 months with your sugar log.   Please stop at the lab.

## 2014-11-15 ENCOUNTER — Other Ambulatory Visit: Payer: Self-pay | Admitting: *Deleted

## 2014-11-15 MED ORDER — INSULIN LISPRO 100 UNIT/ML (KWIKPEN): 8.0000 [IU] | PEN_INJECTOR | Freq: Three times a day (TID) | SUBCUTANEOUS | Status: DC

## 2014-11-15 NOTE — Telephone Encounter (Signed)
Ins does not cover Novolog. Per Dr Cruzita Lederer ok to switch pt to Humalog kwik pen.

## 2015-01-04 ENCOUNTER — Encounter: Payer: Self-pay | Admitting: Family Medicine

## 2015-01-04 DIAGNOSIS — Z1239 Encounter for other screening for malignant neoplasm of breast: Secondary | ICD-10-CM

## 2015-01-04 NOTE — Telephone Encounter (Signed)
mammo ref sent  Please help schedule appt.

## 2015-01-13 ENCOUNTER — Ambulatory Visit (INDEPENDENT_AMBULATORY_CARE_PROVIDER_SITE_OTHER): Payer: 59 | Admitting: Internal Medicine

## 2015-01-13 ENCOUNTER — Encounter: Payer: Self-pay | Admitting: Internal Medicine

## 2015-01-13 VITALS — BP 102/60 | HR 105 | Temp 98.0°F | Resp 12 | Wt 183.6 lb

## 2015-01-13 DIAGNOSIS — E119 Type 2 diabetes mellitus without complications: Secondary | ICD-10-CM | POA: Diagnosis not present

## 2015-01-13 DIAGNOSIS — E1165 Type 2 diabetes mellitus with hyperglycemia: Secondary | ICD-10-CM

## 2015-01-13 MED ORDER — INSULIN DETEMIR 100 UNIT/ML FLEXPEN: 25.0000 [IU] | PEN_INJECTOR | Freq: Two times a day (BID) | SUBCUTANEOUS | Status: DC

## 2015-01-13 MED ORDER — CANAGLIFLOZIN 100 MG PO TABS: 100.0000 mg | ORAL_TABLET | Freq: Every day | ORAL | Status: DC

## 2015-01-13 MED ORDER — INSULIN LISPRO 100 UNIT/ML (KWIKPEN): 8.0000 [IU] | PEN_INJECTOR | Freq: Three times a day (TID) | SUBCUTANEOUS | Status: DC

## 2015-01-13 NOTE — Patient Instructions (Signed)
Please continue: - Metformin 1000 mg 2x a day - Levemir 25-30 units 2x a day - Humalog rapid-acting insulin 15 min before the 3 meals - 8 units before a small meal - 12 units before a regular meal - 16-18 units before a large meal   Please add: - Invokana 100 mg daily in am  Please return in 1.5 months with your sugar log.

## 2015-01-13 NOTE — Progress Notes (Signed)
Patient ID: Lindsey Mason, female   DOB: Apr 20, 1971, 44 y.o.   MRN: [id number]  HPI: Lindsey Mason is a 44 y.o.-year-old female, returning for f/u for DM2 (initially GDM with 3 pregnancy), dx 2005,  insulin-dependent since 2013, uncontrolled, without complications. Last visit 2 mo ago.  She did not have insurance for a while. She has [facility].  Last hemoglobin A1c was: Lab Results  Component Value Date   HGBA1C 11.9* 11/11/2014   HGBA1C 11.9* 03/09/2014   HGBA1C 10.4* 05/19/2012  She had a steroid inj in 03/2014 (shoulder).  Pt is on a regimen of: - Metformin 1000 mg 2x a day - Levemir 40 >> 25-30 units 2x a day - reduced the dose b/c HAs, now HAs resolved (Lantus >> HAs!) - Humalog rapid-acting insulin 15 min before the 3 meals- restarted 10/2014 - 8 units before a small meal - 12 units before a regular meal - 16-18 units before a large meal  Tried Glyburide in the past.  Pt checks her sugars 2x a day: - am: 200-300 >> 160-180 - 2h after b'fast: n/c - before lunch: n/c - 2h after lunch: 200-300 >> n/c - before dinner: n/c  - 2h after dinner: n/c >> 200-220 - bedtime: n/c - nighttime: n/c No lows. ? if she has hypoglycemia awareness.  Pt's meals are: - Breakfast: biscuit - egg sausage, coffee sugar free creamer and stevia - Lunch: may miss: crackers, pretzels, granola/cereal bars - Dinner: meat + 2 veggies  - Snacks: no Coke zero -  1 a day. No exercise.  - no CKD, last BUN/creatinine:  Lab Results  Component Value Date   BUN 9 03/09/2014   CREATININE 0.5 03/09/2014  Not on ACEI/ARB. - last set of lipids: Lab Results  Component Value Date   CHOL 137 03/04/2012   HDL 41.20 03/04/2012   LDLCALC 72 03/04/2012   LDLDIRECT 104.2 03/09/2014   TRIG 119.0 03/04/2012   CHOLHDL 3 03/04/2012  Not on a statin. - last eye exam was in 10/2014. No DR.  - no numbness and tingling in her feet. No neuropathy.  She has a h/o PCOS.   ROS: Constitutional: + weight loss -  decreased appetite, + fatigue, no  hot flushes, no increased urination Eyes: + blurry vision (with HAs, now resolved), no xerophthalmia ENT: + sore throat, no nodules palpated in throat, no dysphagia/no odynophagia, no hoarseness Cardiovascular: no CP/SOB/palpitations/leg swelling Respiratory: no cough/SOB Gastrointestinal: no N/V/D/C Musculoskeletal: no muscle/joint aches Skin: no rashes, no  hair loss Neurological: no tremors/numbness/tingling/dizziness, + HA - improved  I reviewed pt's medications, allergies, PMH, social hx, family hx, and changes were documented in the history of present illness. Otherwise, unchanged from my initial visit note. Past Medical History  Diagnosis Date  . Headache(784.0)   . Esophageal reflux   . Diabetes mellitus without mention of complication   . Pure hypercholesterolemia   . Rosacea   . PCOS (polycystic ovarian syndrome) 05/27/2012   Past Surgical History  Procedure Laterality Date  . Cesarean section      triplets and singles  . Appendectomy    . Tubal ligation     History   Social History  . Marital Status: Married    Spouse Name: N/A    Number of Children: 5   Occupational History  . music minister    Social History Main Topics  . Smoking status: Former Smoker    Types: Cigarettes    Quit date: 03/04/2004  . Smokeless  tobacco: Never Used  . Alcohol Use: No     Comment: occ, 1x a mo - wine  . Drug Use: No   Current Outpatient Prescriptions on File Prior to Visit  Medication Sig Dispense Refill  . ibuprofen (ADVIL,MOTRIN) 200 MG tablet Take 400 mg by mouth every 6 (six) hours as needed.    . Insulin Detemir (LEVEMIR FLEXPEN) 100 UNIT/ML Pen Inject 40 Units into the skin 2 (two) times daily. (Patient taking differently: Inject 25 Units into the skin 2 (two) times daily. ) 15 mL 2  . insulin lispro (HUMALOG KWIKPEN) 100 UNIT/ML KiwkPen Inject 0.08-0.16 mLs (8-16 Units total) into the skin 3 (three) times daily with meals. (Patient  taking differently: Inject 18 Units into the skin 3 (three) times daily with meals. ) 15 mL 2  . Insulin Pen Needle (FIFTY50 PEN NEEDLES) 31G X 5 MM MISC Use 3x a day 200 each 11  . metFORMIN (GLUCOPHAGE) 1000 MG tablet TAKE 1 TABLET TWICE DAILY WITH FOOD 60 tablet 5  . diclofenac (VOLTAREN) 75 MG EC tablet Take 1 tablet (75 mg total) by mouth 2 (two) times daily. (Patient not taking: Reported on 01/13/2015) 60 tablet 3  . Insulin Glargine (LANTUS) 100 UNIT/ML Solostar Pen Inject 40 Units into the skin 2 (two) times daily.     No current facility-administered medications on file prior to visit.   Allergies  Allergen Reactions  . Lantus [Insulin Glargine] Other (See Comments)    headaches   Family History  Problem Relation Age of Onset  . Cancer Mother     breast  . Heart disease Mother   . Stroke Mother    PE: BP 102/60 mmHg  Pulse 105  Temp(Src) 98 F (36.7 C) (Oral)  Resp 12  Wt 183 lb 9.6 oz (83.28 kg)  SpO2 98% Body mass index is 28.75 kg/(m^2). Wt Readings from Last 3 Encounters:  01/13/15 183 lb 9.6 oz (83.28 kg)  11/11/14 189 lb 9.6 oz (86.002 kg)  11/04/14 192 lb 4 oz (87.204 kg)   Constitutional: overweight, in NAD Eyes: PERRLA, EOMI, no exophthalmos ENT: moist mucous membranes, no thyromegaly, no cervical lymphadenopathy Cardiovascular: tachycardia, RR, No MRG Respiratory: CTA B Gastrointestinal: abdomen soft, NT, ND, BS+ Musculoskeletal: no deformities, strength intact in all 4 Skin: moist, warm, no rashes Neurological: no tremor with outstretched hands, DTR normal in all 4  ASSESSMENT: 1. DM2, insulin-dependent, uncontrolled, without complications  PLAN:  1. Patient with long-standing, uncontrolled diabetes, with improved sugars but still above target after adding back mealtime insulin at last visit. Will try to add an SGLT2 R antagonist. - we discussed about SEs of Invokana, which are: dizziness (advised to be careful when stands from sitting position),  decreased BP - usually not < normal (BP today is not low), and fungal UTIs (advised to let me know if develops one).  - given discount card for Invokana - I advised her to:  Patient Instructions  Please continue: - Metformin 1000 mg 2x a day - Levemir 25-30 units 2x a day - Humalog rapid-acting insulin 15 min before the 3 meals - 8 units before a small meal - 12 units before a regular meal - 16-18 units before a large meal   Please add: - Invokana 100 mg daily in am  Please return in 1.5 months with your sugar log.   - continue checking sugars at different times of the day - check 3 times a day, rotating checks - advised for yearly  eye exams >> she is UTD - check HbA1c at next visit - Return to clinic in 1.5 mo with sugar log >> needs HbA1c and BMP then

## 2015-03-01 ENCOUNTER — Ambulatory Visit: Payer: Self-pay | Admitting: Internal Medicine

## 2015-03-03 ENCOUNTER — Encounter: Payer: Self-pay | Admitting: Primary Care

## 2015-03-03 ENCOUNTER — Ambulatory Visit (INDEPENDENT_AMBULATORY_CARE_PROVIDER_SITE_OTHER): Payer: 59 | Admitting: Primary Care

## 2015-03-03 VITALS — BP 110/70 | HR 96 | Temp 98.2°F | Ht 66.0 in | Wt 187.8 lb

## 2015-03-03 DIAGNOSIS — J029 Acute pharyngitis, unspecified: Secondary | ICD-10-CM | POA: Diagnosis not present

## 2015-03-03 DIAGNOSIS — G44209 Tension-type headache, unspecified, not intractable: Secondary | ICD-10-CM | POA: Diagnosis not present

## 2015-03-03 MED ORDER — AZITHROMYCIN 250 MG PO TABS: ORAL_TABLET | ORAL | Status: DC

## 2015-03-03 MED ORDER — KETOROLAC TROMETHAMINE 30 MG/ML IJ SOLN
30.0000 mg | Freq: Once | INTRAMUSCULAR | Status: AC
  Administered 2015-03-03: 30 mg via INTRAMUSCULAR

## 2015-03-03 NOTE — Progress Notes (Signed)
Subjective:    Patient ID: Lindsey Mason, female    DOB: October 11, 1970, 44 y.o.   MRN: [id number]  HPI  Lindsey Mason is a 44 year old female who presents today with a chief complaint of sore throat. She describes her discomfort as tightness with some jaw pain. Her symptoms began Sunday night and was running a low grade fever through yesterday. Her daughter was diagnosed with strep on Wednesday this week. She also reports nausea, headache and dizziness. She has a history of type 2 diabetes and reports her blood sugar was elevated at 220 this morning. Overall her symptoms are worse. Denies cough, nasal congestion, sinus pressure.  Her most bothersome symptom is the headache which is located to her bilateral frontal lobes and feels like "tightness". She's taken tylenol and ibuprofen without much relief. Afebrile in clinic today.  Review of Systems  HENT: Positive for sore throat. Negative for congestion, rhinorrhea and sinus pressure.        Ear pressure  Respiratory: Negative for cough and shortness of breath.   Cardiovascular: Negative for chest pain.  Gastrointestinal: Positive for nausea. Negative for vomiting and abdominal pain.  Neurological: Positive for dizziness and headaches.       Past Medical History  Diagnosis Date  . Headache(784.0)   . Esophageal reflux   . Diabetes mellitus without mention of complication   . Pure hypercholesterolemia   . Rosacea   . PCOS (polycystic ovarian syndrome) 05/27/2012    Social History   Social History  . Marital Status: Married    Spouse Name: N/A  . Number of Children: N/A  . Years of Education: N/A   Occupational History  . music minister    Social History Main Topics  . Smoking status: Former Smoker    Types: Cigarettes    Quit date: 03/04/2004  . Smokeless tobacco: Never Used  . Alcohol Use: No     Comment: occ  . Drug Use: No  . Sexual Activity: Not on file   Other Topics Concern  . Not on file   Social History Narrative      Past Surgical History  Procedure Laterality Date  . Cesarean section      triplets and singles  . Appendectomy    . Tubal ligation      Family History  Problem Relation Age of Onset  . Cancer Mother     breast  . Heart disease Mother   . Stroke Mother     Allergies  Allergen Reactions  . Lantus [Insulin Glargine] Other (See Comments)    headaches    Current Outpatient Prescriptions on File Prior to Visit  Medication Sig Dispense Refill  . canagliflozin (INVOKANA) 100 MG TABS tablet Take 1 tablet (100 mg total) by mouth daily. 30 tablet 2  . diclofenac (VOLTAREN) 75 MG EC tablet Take 1 tablet (75 mg total) by mouth 2 (two) times daily. 60 tablet 3  . ibuprofen (ADVIL,MOTRIN) 200 MG tablet Take 400 mg by mouth every 6 (six) hours as needed.    . Insulin Detemir (LEVEMIR FLEXPEN) 100 UNIT/ML Pen Inject 25-30 Units into the skin 2 (two) times daily. 15 mL 2  . insulin lispro (HUMALOG KWIKPEN) 100 UNIT/ML KiwkPen Inject 0.08-0.16 mLs (8-16 Units total) into the skin 3 (three) times daily with meals. 15 mL 2  . Insulin Pen Needle (FIFTY50 PEN NEEDLES) 31G X 5 MM MISC Use 3x a day 200 each 11  . metFORMIN (GLUCOPHAGE) 1000 MG tablet  TAKE 1 TABLET TWICE DAILY WITH FOOD 60 tablet 5   No current facility-administered medications on file prior to visit.    BP 110/70 mmHg  Pulse 96  Temp(Src) 98.2 F (36.8 C) (Oral)  Ht 5\' 6"  (1.676 m)  Wt 187 lb 12.8 oz (85.186 kg)  BMI 30.33 kg/m2  SpO2 98%  LMP 02/15/2015    Objective:   Physical Exam  Constitutional: She appears well-nourished.  HENT:  Right Ear: Ear canal normal.  Left Ear: Ear canal normal.  Cerumen impaction bilaterally, able to remove some with visualization of TM. Tm's bilaterally unremarkable.  Cardiovascular: Normal rate and regular rhythm.   Pulmonary/Chest: Effort normal and breath sounds normal.  Abdominal: Soft. Bowel sounds are normal. There is no tenderness.  Skin: Skin is warm and dry.           Assessment & Plan:  Sore throat:  Present with fevers and headache since Sunday. No fever today. No cough, nasal congestion. Lungs clear, exam unremarkable. Rapid strep: Negative. Overall she's feeling worse, due to history and presentation, will print RX for zpak to fill if feeling worse. IM toradol provided today for headache. Fluids rest, tylenol for any fevers. Follow up PRN

## 2015-03-03 NOTE — Patient Instructions (Signed)
Start Azithromycin antibiotic if your symptoms do not improve over the weekend. Take 2 tablets by mouth on day 1, then 1 tablet by mouth daily on days 2-4.   You may use chloraseptic throat spray and warm salt gargles for discomfort to your throat.  Increase intake of water for hydration. Tylenol for any fevers.  It was a pleasure meeting you!

## 2015-04-04 ENCOUNTER — Ambulatory Visit: Payer: Self-pay | Admitting: Internal Medicine

## 2015-04-13 ENCOUNTER — Ambulatory Visit: Payer: Self-pay | Admitting: Internal Medicine

## 2015-04-13 DIAGNOSIS — Z0289 Encounter for other administrative examinations: Secondary | ICD-10-CM

## 2015-04-28 ENCOUNTER — Ambulatory Visit (INDEPENDENT_AMBULATORY_CARE_PROVIDER_SITE_OTHER): Payer: 59 | Admitting: Family Medicine

## 2015-04-28 VITALS — BP 116/74 | HR 77 | Temp 98.0°F | Ht 66.0 in | Wt 186.6 lb

## 2015-04-28 DIAGNOSIS — R7309 Other abnormal glucose: Secondary | ICD-10-CM

## 2015-04-28 DIAGNOSIS — J019 Acute sinusitis, unspecified: Secondary | ICD-10-CM

## 2015-04-28 LAB — BASIC METABOLIC PANEL
BUN: 12 mg/dL (ref 6–23)
CO2: 25 mEq/L (ref 19–32)
Calcium: 9.4 mg/dL (ref 8.4–10.5)
Chloride: 104 mEq/L (ref 96–112)
Creatinine, Ser: 0.49 mg/dL (ref 0.40–1.20)
GFR: 145.04 mL/min (ref >60.00)
GLUCOSE: 208 mg/dL — AB (ref 70–99)
POTASSIUM: 3.7 meq/L (ref 3.5–5.1)
Sodium: 139 mEq/L (ref 135–145)

## 2015-04-28 MED ORDER — AMOXICILLIN-POT CLAVULANATE 875-125 MG PO TABS: 1.0000 | ORAL_TABLET | Freq: Two times a day (BID) | ORAL | Status: DC

## 2015-04-28 NOTE — Progress Notes (Signed)
Patient ID: Lindsey Mason, female   DOB: July 05, 1970, 44 y.o.   MRN: [id number]  Tommi Rumps, MD Phone: [phone]  Lindsey Mason is a 44 y.o. female who presents today for same-day visit.  Patient notes 2 weeks of maxillary sinus congestion and pressure and postnasal drip. She notes additional tension headache with this feeling like her head is being squeezed that is consistent with her typical headaches, though it is persisted with this sinus congestion and pressure. She notes ear fullness and mild eustachian tube discomfort. She has not had any fevers at home. She's not been coughing. She denies numbness, weakness, and vision changes today. She does note she did have a short episode of blurry vision yesterday where both eyes were blurry. She notes with this she had to look out of the top portion of her bifocals to see well. She did check her blood sugar at that time and it was around 317. She notes her sugars have been in the low 300s for a long time. She notes this is improved from the previous 400s that they were prior to this. She has no neck pain with this. She notes no neurological issues at this time.  PMH: Former smoker.   ROS see history of present illness  Objective  Physical Exam Filed Vitals:   04/28/15 1312  BP: 116/74  Pulse: 77  Temp: 98 F (36.7 C)    Physical Exam  Constitutional: She is well-developed, well-nourished, and in no distress.  HENT:  Head: Normocephalic and atraumatic.  Right Ear: External ear normal.  Left Ear: External ear normal.  Mouth/Throat: Oropharynx is clear and moist. No oropharyngeal exudate.  Normal TMs bilaterally, tenderness over frontal and maxillary sinuses to percussion  Eyes: Conjunctivae are normal. Pupils are equal, round, and reactive to light.  Neck: Normal range of motion. Neck supple.  No tenderness on palpation of neck  Cardiovascular: Normal rate, regular rhythm and normal heart sounds.  Exam reveals no gallop and no  friction rub.   No murmur heard. Pulmonary/Chest: Effort normal and breath sounds normal. No respiratory distress. She has no wheezes. She has no rales.  Lymphadenopathy:    She has no cervical adenopathy.  Neurological: She is alert.  CN 2-12 intact, 5/5 strength in bilateral biceps, triceps, grip, quads, hamstrings, plantar and dorsiflexion, sensation to light touch intact in bilateral UE and LE, normal gait, 2+ patellar reflexes  Skin: Skin is warm and dry. She is not diaphoretic.     Assessment/Plan: Please see individual problem list.  Acute sinusitis Patient with symptoms consistent with bacterial sinus infection. Suspect this is likely contributing to her headaches. She is neurologically intact at this time. She does report a single episode of blurry vision for several hours yesterday. She did note her blood sugar was in the 300s with this. Her vision is intact today on vision screening and in visual field testing. She notes no deficits today. Suspect this is related to her blood sugar being elevated though I did discuss with her that it could be related to other causes including intracranial issues or ocular issues and I did discuss potential workup for these issues with the patient. She voiced understanding of this and was in agreement that this should be continued to be monitored and if it recurs she will seek medical attention for further workup. We will treat her with Augmentin for her sinus infection. We will check a BMP to ensure that her blood sugar and kidney function are  normal. She will seek medical attention if she has recurrence of vision issues or she develops any new symptoms. Given return precautions.    Orders Placed This Encounter  Procedures  . Basic Metabolic Panel (BMET)    Meds ordered this encounter  Medications  . amoxicillin-clavulanate (AUGMENTIN) 875-125 MG tablet    Sig: Take 1 tablet by mouth 2 (two) times daily.    Dispense:  14 tablet    Refill:  0     Tommi Rumps

## 2015-04-28 NOTE — Progress Notes (Signed)
Pre visit review using our clinic review tool, if applicable. No additional management support is needed unless otherwise documented below in the visit note. 

## 2015-04-28 NOTE — Patient Instructions (Signed)
Nice to meet you. You likely have a sinus infection that is leading to your symptoms. We will treat this with antibiotics. You can take ibuprofen 600 mg every 6 hours as needed for discomfort. You can also take tylenol 1000 mg every 8 hours as needed for discomfort.  If you have worsening of your headache, nausea, vomiting, fever, numbness, weakness, vision changes, shortness of breath, or any new symptoms or worsening symptoms please seek medical attention.

## 2015-05-01 ENCOUNTER — Encounter: Payer: Self-pay | Admitting: Family Medicine

## 2015-05-01 NOTE — Assessment & Plan Note (Signed)
Patient with symptoms consistent with bacterial sinus infection. Suspect this is likely contributing to her headaches. She is neurologically intact at this time. She does report a single episode of blurry vision for several hours yesterday. She did note her blood sugar was in the 300s with this. Her vision is intact today on vision screening and in visual field testing. She notes no deficits today. Suspect this is related to her blood sugar being elevated though I did discuss with her that it could be related to other causes including intracranial issues or ocular issues and I did discuss potential workup for these issues with the patient. She voiced understanding of this and was in agreement that this should be continued to be monitored and if it recurs she will seek medical attention for further workup. We will treat her with Augmentin for her sinus infection. We will check a BMP to ensure that her blood sugar and kidney function are normal. She will seek medical attention if she has recurrence of vision issues or she develops any new symptoms. Given return precautions.

## 2015-06-14 ENCOUNTER — Other Ambulatory Visit (INDEPENDENT_AMBULATORY_CARE_PROVIDER_SITE_OTHER): Payer: 59

## 2015-06-14 DIAGNOSIS — Z79899 Other long term (current) drug therapy: Secondary | ICD-10-CM

## 2015-06-14 DIAGNOSIS — E785 Hyperlipidemia, unspecified: Secondary | ICD-10-CM

## 2015-06-14 DIAGNOSIS — E119 Type 2 diabetes mellitus without complications: Secondary | ICD-10-CM

## 2015-06-14 LAB — CBC WITH DIFFERENTIAL/PLATELET
Basophils Absolute: 0 10*3/uL (ref 0.0–0.1)
Basophils Relative: 0.3 % (ref 0.0–3.0)
EOS PCT: 1.9 % (ref 0.0–5.0)
Eosinophils Absolute: 0.2 10*3/uL (ref 0.0–0.7)
HEMATOCRIT: 44.5 % (ref 36.0–46.0)
HEMOGLOBIN: 14.6 g/dL (ref 12.0–15.0)
LYMPHS PCT: 29.5 % (ref 12.0–46.0)
Lymphs Abs: 2.4 10*3/uL (ref 0.7–4.0)
MCHC: 33 g/dL (ref 30.0–36.0)
MCV: 86.8 fl (ref 78.0–100.0)
MONOS PCT: 5.1 % (ref 3.0–12.0)
Monocytes Absolute: 0.4 10*3/uL (ref 0.1–1.0)
Neutro Abs: 5.1 10*3/uL (ref 1.4–7.7)
Neutrophils Relative %: 63.2 % (ref 43.0–77.0)
Platelets: 236 10*3/uL (ref 150.0–400.0)
RBC: 5.12 Mil/uL — AB (ref 3.87–5.11)
RDW: 13.9 % (ref 11.5–15.5)
WBC: 8 10*3/uL (ref 4.0–10.5)

## 2015-06-14 LAB — LIPID PANEL
Cholesterol: 148 mg/dL (ref 0–200)
HDL: 40.3 mg/dL (ref >39.00)
LDL Cholesterol: 88 mg/dL (ref 0–99)
NonHDL: 107.39
Total CHOL/HDL Ratio: 4
Triglycerides: 96 mg/dL (ref 0.0–149.0)
VLDL: 19.2 mg/dL (ref 0.0–40.0)

## 2015-06-14 LAB — MICROALBUMIN / CREATININE URINE RATIO
CREATININE, U: 27.5 mg/dL
MICROALB/CREAT RATIO: 2.5 mg/g (ref 0.0–30.0)

## 2015-06-14 LAB — BASIC METABOLIC PANEL
BUN: 9 mg/dL (ref 6–23)
CO2: 26 mEq/L (ref 19–32)
Calcium: 8.8 mg/dL (ref 8.4–10.5)
Chloride: 103 mEq/L (ref 96–112)
Creatinine, Ser: 0.52 mg/dL (ref 0.40–1.20)
GFR: 135.34 mL/min (ref >60.00)
GLUCOSE: 402 mg/dL — AB (ref 70–99)
POTASSIUM: 4 meq/L (ref 3.5–5.1)
SODIUM: 137 meq/L (ref 135–145)

## 2015-06-14 LAB — HEPATIC FUNCTION PANEL
ALBUMIN: 3.9 g/dL (ref 3.5–5.2)
ALT: 10 U/L (ref 0–35)
AST: 9 U/L (ref 0–37)
Alkaline Phosphatase: 88 U/L (ref 39–117)
BILIRUBIN TOTAL: 0.5 mg/dL (ref 0.2–1.2)
Bilirubin, Direct: 0.1 mg/dL (ref 0.0–0.3)
Total Protein: 6.7 g/dL (ref 6.0–8.3)

## 2015-06-14 LAB — HEMOGLOBIN A1C: Hgb A1c MFr Bld: 11.8 % — ABNORMAL HIGH (ref 4.6–6.5)

## 2015-06-21 ENCOUNTER — Ambulatory Visit (INDEPENDENT_AMBULATORY_CARE_PROVIDER_SITE_OTHER): Payer: 59 | Admitting: Family Medicine

## 2015-06-21 ENCOUNTER — Encounter: Payer: Self-pay | Admitting: Family Medicine

## 2015-06-21 VITALS — BP 110/76 | HR 91 | Temp 98.1°F | Ht 67.0 in | Wt 184.0 lb

## 2015-06-21 DIAGNOSIS — Z0000 Encounter for general adult medical examination without abnormal findings: Secondary | ICD-10-CM | POA: Diagnosis not present

## 2015-06-21 DIAGNOSIS — Z23 Encounter for immunization: Secondary | ICD-10-CM

## 2015-06-21 MED ORDER — METFORMIN HCL 1000 MG PO TABS: 1000.0000 mg | ORAL_TABLET | Freq: Two times a day (BID) | ORAL | Status: DC

## 2015-06-21 NOTE — Patient Instructions (Signed)
Levemir: 20 units 2x a day -  - Humalog rapid-acting insulin 15 min before the 3 meals- restarted 10/2014 - 8 units before a small meal - 12 units before a regular meal - 16-18 units before a large meal

## 2015-06-21 NOTE — Progress Notes (Signed)
Dr. Frederico Hamman T. Ellawyn Wogan, MD, [facility] [place], [address] Phone: [phone] Fax: [phone]  06/21/2015  Patient: Lindsey Mason, MRN: [id number], DOB: 08-Nov-1970, 44 y.o.  Primary Physician:  Owens Loffler, MD   Chief Complaint  Patient presents with  . Annual Exam   Subjective:   Lindsey Mason is a 44 y.o. pleasant patient who presents with the following:  Health Maintenance Summary Reviewed and updated, unless pt declines services.  Tobacco History Reviewed. Non-smoker Alcohol: No concerns, no excessive use Exercise Habits: Some activity, rec at least 30 mins 5 times a week STD concerns: none Drug Use: None Birth control method: abstinent Menses regular: no / intermittent Lumps or breast concerns: no Breast Cancer Family History: Mother  Behind on her mammograms - will call to make appt.  Health Maintenance  Topic Date Due  . HIV Screening  02/20/1985  . PNEUMOCOCCAL POLYSACCHARIDE VACCINE (2) 04/11/2014  . HEMOGLOBIN A1C  12/13/2015  . INFLUENZA VACCINE  01/30/2016  . URINE MICROALBUMIN  06/13/2016  . FOOT EXAM  06/20/2016  . OPHTHALMOLOGY EXAM  06/20/2016  . PAP SMEAR  05/27/2017  . TETANUS/TDAP  05/27/2022    Immunization History  Administered Date(s) Administered  . Influenza Whole 04/11/2009  . Influenza, Seasonal, Injecte, Preservative Fre 05/27/2012  . Influenza,inj,Quad PF,36+ Mos 03/09/2014, 06/21/2015  . Pneumococcal Conjugate-13 03/09/2014  . Pneumococcal Polysaccharide-23 04/11/2009  . Tdap 05/27/2012   Patient Active Problem List   Diagnosis Date Noted  . Poorly controlled type 2 diabetes mellitus ([place]) 03/08/2009    Priority: High  . 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 PCOS (polycystic ovarian syndrome) 05/27/2012  . 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 DEPRESSIVE DISORDER 08/20/2010  . HYPERLIPIDEMIA 04/11/2009  . 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 GERD  03/08/2009   Past Medical History  Diagnosis Date  . Headache(784.0)   . Esophageal reflux   . Diabetes mellitus without mention of complication   . Pure hypercholesterolemia   . Rosacea   . PCOS (polycystic ovarian syndrome) 05/27/2012   Past Surgical History  Procedure Laterality Date  . Cesarean section      triplets and singles  . Appendectomy    . Tubal ligation     Social History   Social History  . Marital Status: Married    Spouse Name: N/A  . Number of Children: N/A  . Years of Education: N/A   Occupational History  . music minister    Social History Main Topics  . Smoking status: Former Smoker    Types: Cigarettes    Quit date: 03/04/2004  . Smokeless tobacco: Never Used  . Alcohol Use: No     Comment: occ  . Drug Use: No  . Sexual Activity: Not on file   Other Topics Concern  . Not on file   Social History Narrative   Family History  Problem Relation Age of Onset  . Cancer Mother     breast  . Heart disease Mother   . Stroke Mother    Allergies  Allergen Reactions  . Lantus [Insulin Glargine] Other (See Comments)    headaches    Medication list has been reviewed and updated.   General: Denies fever, chills, sweats. No significant weight loss. Eyes: Denies blurring,significant itching ENT: Denies earache, sore throat, and hoarseness.  Cardiovascular: Denies chest pains, palpitations, dyspnea on exertion,  Respiratory: Denies cough, dyspnea at rest,wheeezing Breast: no concerns about lumps  GI: Denies nausea, vomiting, diarrhea, constipation, change in bowel habits, abdominal pain, melena, hematochezia GU: Denies dysuria, hematuria, urinary hesitancy, nocturia, denies STD risk, no concerns about discharge Musculoskeletal: Denies back pain, joint pain. FROZEN SHOULDER IS RECOVERING Derm: Denies rash, itching Neuro: Denies  paresthesias, frequent falls, frequent headaches Psych: Denies depression, anxiety Endocrine: Denies cold  intolerance, heat intolerance, polydipsia Heme: Denies enlarged lymph nodes Allergy: No hayfever  Objective:   BP 110/76 mmHg  Pulse 91  Temp(Src) 98.1 F (36.7 C) (Oral)  Ht _0  (1.702 m)  Wt 184 lb (83.462 kg)  BMI 28.81 kg/m2  LMP 05/11/2015 No exam data present  GEN: well developed, well nourished, no acute distress Eyes: conjunctiva and lids normal, PERRLA, EOMI ENT: TM clear, nares clear, oral exam WNL Neck: supple, no lymphadenopathy, no thyromegaly, no JVD Pulm: clear to auscultation and percussion, respiratory effort normal CV: regular rate and rhythm, S1-S2, no murmur, rub or gallop, no bruits Chest: no scars, masses, no lumps BREAST: breast exam normal. Chaperoned examination. Entirety of breast examined including axilla, and no enlarged lymph nodes. No significant masses are felt. No nipple discharge. No skin changes. Overall, reassuring breast exam.  This portion of the physical examination was chaperoned by Hedy Camara, CMA.  GI: soft, non-tender; no hepatosplenomegaly, masses; active bowel sounds all quadrants GU: GU exam declined Lymph: no cervical, axillary or inguinal adenopathy MSK: gait normal, muscle tone and strength WNL, no joint swelling, effusions, discoloration, crepitus  SKIN: clear, good turgor, color WNL, no rashes, lesions, or ulcerations Neuro: normal mental status, normal strength, sensation, and motion Psych: alert; oriented to person, place and time, normally interactive and not anxious or depressed in appearance.   All labs reviewed with patient. Lipids:    Component Value Date/Time   CHOL 148 06/14/2015 1123   TRIG 96.0 06/14/2015 1123   HDL 40.30 06/14/2015 1123   LDLDIRECT 104.2 03/09/2014 1040   VLDL 19.2 06/14/2015 1123   CHOLHDL 4 06/14/2015 1123   CBC: CBC Latest Ref Rng 06/14/2015 03/09/2014 01/04/2014  WBC 4.0 - 10.5 K/uL 8.0 9.3 10.7(H)  Hemoglobin 12.0 - 15.0 g/dL 14.6 14.4 14.7  Hematocrit 36.0 - 46.0 % 44.5 43.0 43.1    Platelets 150.0 - 400.0 K/uL 236.0 277.0 670    Basic Metabolic Panel:    Component Value Date/Time   NA 137 06/14/2015 1123   K 4.0 06/14/2015 1123   CL 103 06/14/2015 1123   CO2 26 06/14/2015 1123   BUN 9 06/14/2015 1123   CREATININE 0.52 06/14/2015 1123   GLUCOSE 402* 06/14/2015 1123   CALCIUM 8.8 06/14/2015 1123   Hepatic Function Latest Ref Rng 06/14/2015 03/09/2014 03/04/2012  Total Protein 6.0 - 8.3 g/dL 6.7 7.1 7.2  Albumin 3.5 - 5.2 g/dL 3.9 3.9 3.8  AST 0 - 37 U/L _1 ALT 0 - 35 U/L 10 17 46(H)  Alk Phosphatase 39 - 117 U/L 88 76 84  Total Bilirubin 0.2 - 1.2 mg/dL 0.5 0.7 0.7  Bilirubin, Direct 0.0 - 0.3 mg/dL 0.1 0.0 0.0    Lab Results  Component Value Date   TSH 0.38 03/09/2014   Lab Results  Component Value Date   HGBA1C 11.8* 06/14/2015    Assessment and Plan:   Healthcare maintenance  Need for prophylactic vaccination and inoculation against influenza - Plan: Flu Vaccine QUAD 36+ mos IM  Health Maintenance Exam: The patient's preventative maintenance and recommended screening tests for an annual wellness exam were reviewed in full  today. Brought up to date unless services declined.  Counselled on the importance of diet, exercise, and its role in overall health and mortality. The patient's FH and SH was reviewed, including their home life, tobacco status, and drug and alcohol status.  Ran out of insulin - start back with dosing below, and can titrate up. a1c is > 11. Out of insulin for 1 week. I have asked her to f/u with Dr. Renne Crigler again - I think I will need help with her DM management.  Patient Instructions  Levemir: 20 units 2x a day -  - Humalog rapid-acting insulin 15 min before the 3 meals- restarted 10/2014 - 8 units before a small meal - 12 units before a regular meal - 16-18 units before a large meal     Follow-up: No Follow-up on file. Or follow-up in 1 year for complete physical examination  New Prescriptions   No medications  on file   Modified Medications   Modified Medication Previous Medication   METFORMIN (GLUCOPHAGE) 1000 MG TABLET metFORMIN (GLUCOPHAGE) 1000 MG tablet      Take 1 tablet (1,000 mg total) by mouth 2 (two) times daily with a meal.    TAKE 1 TABLET TWICE DAILY WITH FOOD   Orders Placed This Encounter  Procedures  . Flu Vaccine QUAD 36+ mos IM    Signed,  Amrie Gurganus T. Iman Reinertsen, MD   Patient's Medications  New Prescriptions   No medications on file  Previous Medications   DICLOFENAC (VOLTAREN) 75 MG EC TABLET    Take 1 tablet (75 mg total) by mouth 2 (two) times daily.   IBUPROFEN (ADVIL,MOTRIN) 200 MG TABLET    Take 400 mg by mouth every 6 (six) hours as needed.   INSULIN DETEMIR (LEVEMIR FLEXPEN) 100 UNIT/ML PEN    Inject 25-30 Units into the skin 2 (two) times daily.   INSULIN LISPRO (HUMALOG KWIKPEN) 100 UNIT/ML KIWKPEN    Inject 0.08-0.16 mLs (8-16 Units total) into the skin 3 (three) times daily with meals.   INSULIN PEN NEEDLE (FIFTY50 PEN NEEDLES) 31G X 5 MM MISC    Use 3x a day  Modified Medications   Modified Medication Previous Medication   METFORMIN (GLUCOPHAGE) 1000 MG TABLET metFORMIN (GLUCOPHAGE) 1000 MG tablet      Take 1 tablet (1,000 mg total) by mouth 2 (two) times daily with a meal.    TAKE 1 TABLET TWICE DAILY WITH FOOD  Discontinued Medications   AMOXICILLIN-CLAVULANATE (AUGMENTIN) 875-125 MG TABLET    Take 1 tablet by mouth 2 (two) times daily.   CANAGLIFLOZIN (INVOKANA) 100 MG TABS TABLET    Take 1 tablet (100 mg total) by mouth daily.

## 2015-06-21 NOTE — Progress Notes (Signed)
Pre visit review using our clinic review tool, if applicable. No additional management support is needed unless otherwise documented below in the visit note. 

## 2015-07-07 ENCOUNTER — Ambulatory Visit (INDEPENDENT_AMBULATORY_CARE_PROVIDER_SITE_OTHER): Payer: BLUE CROSS/BLUE SHIELD | Admitting: Family Medicine

## 2015-07-07 ENCOUNTER — Encounter: Payer: Self-pay | Admitting: Family Medicine

## 2015-07-07 VITALS — BP 110/70 | HR 91 | Temp 97.8°F | Wt 186.0 lb

## 2015-07-07 DIAGNOSIS — J029 Acute pharyngitis, unspecified: Secondary | ICD-10-CM | POA: Diagnosis not present

## 2015-07-07 LAB — POCT RAPID STREP A (OFFICE): Rapid Strep A Screen: NEGATIVE

## 2015-07-07 MED ORDER — AMOXICILLIN 500 MG PO CAPS: 1000.0000 mg | ORAL_CAPSULE | Freq: Two times a day (BID) | ORAL | Status: DC

## 2015-07-07 NOTE — Assessment & Plan Note (Signed)
Neg strep but concerning for bacterial infection given persistence versus allergy ( she has no sneezing or itchy eyes or PMH of this). Will treat with amox x 10 days, symptomatic care and antihiustamine prn.

## 2015-07-07 NOTE — Progress Notes (Signed)
Pre visit review using our clinic review tool, if applicable. No additional management support is needed unless otherwise documented below in the visit note. 

## 2015-07-07 NOTE — Patient Instructions (Signed)
Complete a course of antibiotics to cover for bacterial infection.  Tylenol or ibuprofen for sore throat.  Can try Claritin or Zyrtec for postnassl drip in case allergies are trigger.  Call if not improving in 7-10 days, or sooner if fever on antibiotics or worsening ST.

## 2015-07-07 NOTE — Progress Notes (Signed)
   Subjective:    Patient ID: Lindsey Mason, female    DOB: 26-Nov-1970, 45 y.o.   MRN: [id number]  Cough This is a new problem. The current episode started in the past 7 days. The problem has been gradually worsening. The cough is non-productive. Associated symptoms include postnasal drip and a sore throat. Pertinent negatives include no chills, ear congestion, ear pain, fever, nasal congestion, shortness of breath or wheezing. Associated symptoms comments:  She has had sore throat in last few weeks, she does have post nasal drip Itchy feeling in right ear. Nothing aggravates the symptoms. Risk factors: former smoker. Treatments tried: tylenol for sore throat. The treatment provided mild relief. There is no history of asthma, COPD, environmental allergies or pneumonia.   Sore throat is much worse than mild cough.   Review of Systems  Constitutional: Negative for fever and chills.  HENT: Positive for postnasal drip and sore throat. Negative for ear pain.   Respiratory: Positive for cough. Negative for shortness of breath and wheezing.   Allergic/Immunologic: Negative for environmental allergies.   Social History /Family History/Past Medical History reviewed and updated if needed. Several of her kids have strep throat.     Objective:   Physical Exam  Constitutional: Vital signs are normal. She appears well-developed and well-nourished. She is cooperative.  Non-toxic appearance. She does not appear ill. No distress.  HENT:  Head: Normocephalic.  Right Ear: Hearing, tympanic membrane, external ear and ear canal normal. Tympanic membrane is not erythematous, not retracted and not bulging.  Left Ear: Hearing, tympanic membrane, external ear and ear canal normal. Tympanic membrane is not erythematous, not retracted and not bulging.  Nose: Mucosal edema and rhinorrhea present. Right sinus exhibits no maxillary sinus tenderness and no frontal sinus tenderness. Left sinus exhibits no maxillary  sinus tenderness and no frontal sinus tenderness.  Mouth/Throat: Uvula is midline and mucous membranes are normal. Posterior oropharyngeal erythema present. No oropharyngeal exudate or posterior oropharyngeal edema.  Eyes: Conjunctivae, EOM and lids are normal. Pupils are equal, round, and reactive to light. Lids are everted and swept, no foreign bodies found.  Neck: Trachea normal and normal range of motion. Neck supple. Carotid bruit is not present. No thyroid mass and no thyromegaly present.  Cardiovascular: Normal rate, regular rhythm, S1 normal, S2 normal, normal heart sounds, intact distal pulses and normal pulses.  Exam reveals no gallop and no friction rub.   No murmur heard. Pulmonary/Chest: Effort normal and breath sounds normal. No tachypnea. No respiratory distress. She has no decreased breath sounds. She has no wheezes. She has no rhonchi. She has no rales.  Neurological: She is alert.  Skin: Skin is warm, dry and intact. No rash noted.  Psychiatric: Her speech is normal and behavior is normal. Judgment normal. Her mood appears not anxious. Cognition and memory are normal. She does not exhibit a depressed mood.          Assessment & Plan:

## 2015-07-20 ENCOUNTER — Encounter: Payer: Self-pay | Admitting: Family Medicine

## 2015-07-20 MED ORDER — SERTRALINE HCL 50 MG PO TABS: 50.0000 mg | ORAL_TABLET | Freq: Every day | ORAL | Status: DC

## 2015-08-31 ENCOUNTER — Encounter: Payer: Self-pay | Admitting: Family Medicine

## 2015-09-11 ENCOUNTER — Encounter: Payer: Self-pay | Admitting: Family Medicine

## 2015-09-11 ENCOUNTER — Telehealth: Payer: Self-pay | Admitting: *Deleted

## 2015-09-11 ENCOUNTER — Ambulatory Visit (INDEPENDENT_AMBULATORY_CARE_PROVIDER_SITE_OTHER): Payer: BLUE CROSS/BLUE SHIELD | Admitting: Family Medicine

## 2015-09-11 VITALS — BP 100/76 | HR 86 | Temp 98.2°F | Ht 67.0 in | Wt 178.2 lb

## 2015-09-11 DIAGNOSIS — E1165 Type 2 diabetes mellitus with hyperglycemia: Secondary | ICD-10-CM

## 2015-09-11 DIAGNOSIS — F331 Major depressive disorder, recurrent, moderate: Secondary | ICD-10-CM | POA: Diagnosis not present

## 2015-09-11 LAB — HEMOGLOBIN A1C: HEMOGLOBIN A1C: 10.5 % — AB (ref 4.6–6.5)

## 2015-09-11 MED ORDER — ESCITALOPRAM OXALATE 10 MG PO TABS
10.0000 mg | ORAL_TABLET | Freq: Every day | ORAL | Status: DC
Start: 2015-09-11 — End: 2015-12-13

## 2015-09-11 MED ORDER — GLIPIZIDE 5 MG PO TABS: 5.0000 mg | ORAL_TABLET | Freq: Two times a day (BID) | ORAL | Status: DC

## 2015-09-11 NOTE — Telephone Encounter (Signed)
Left message for Lindsey Mason to return my call. 

## 2015-09-11 NOTE — Progress Notes (Signed)
Pre visit review using our clinic review tool, if applicable. No additional management support is needed unless otherwise documented below in the visit note. 

## 2015-09-11 NOTE — Patient Instructions (Signed)
Sertraline - cut tablets in 1/2 and take 1/2 a tablet a day for one week, then start new prescription of Lexapro.

## 2015-09-11 NOTE — Telephone Encounter (Signed)
Valecia notified as instructed by telephone.  Glipizide 5 mg prescription sent into [facility] [address] in [place].

## 2015-09-11 NOTE — Progress Notes (Signed)
Dr. Frederico Hamman T. Messina Kosinski, MD, [facility] [place], [address] Phone: [phone] Fax: [phone]  09/11/2015  Patient: Lindsey Mason, MRN: [id number], DOB: 05-06-1971, 45 y.o.  Primary Physician:  Owens Loffler, MD   Chief Complaint  Patient presents with  . Follow-up    Sertraline  . Diabetes   Subjective:   Lindsey Mason is a 45 y.o. very pleasant female patient who presents with the following:  Wt Readings from Last 3 Encounters:  09/11/15 178 lb 4 oz (80.854 kg)  07/07/15 186 lb (84.369 kg)  06/21/15 184 lb (83.462 kg)    Now on invokana and metformin 1000 mg bid  Diabetes Mellitus: Tolerating Medications: yes Compliance with diet: good Exercise: minimal / intermittent Avg blood sugars at home: 108-230 Foot problems: none Hypoglycemia: none No nausea, vomitting, blurred vision, polyuria.  Lost 30 pounds!  Lab Results  Component Value Date   HGBA1C 11.8* 06/14/2015   HGBA1C 11.9* 11/11/2014   HGBA1C 11.9* 03/09/2014   Lab Results  Component Value Date   MICROALBUR <0.7 06/14/2015   LDLCALC 88 06/14/2015   CREATININE 0.52 06/14/2015    Wt Readings from Last 3 Encounters:  09/11/15 178 lb 4 oz (80.854 kg)  07/07/15 186 lb (84.369 kg)  06/21/15 184 lb (83.462 kg)    Body mass index is 27.91 kg/(m^2).   Restarted on some zoloft. Helped some with cravings.  Got back together with husband.  Having trouble with orgasm on sertraline.  Still depressed some.  No anxiety.   Past Medical History, Surgical History, Social History, Family History, Problem List, Medications, and Allergies have been reviewed and updated if relevant.  Patient Active Problem List   Diagnosis Date Noted  . Poorly controlled type 2 diabetes mellitus ([place]) 03/08/2009    Priority: High  . Acute pharyngitis 07/07/2015  . 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 PCOS (polycystic ovarian syndrome) 05/27/2012  .  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 DEPRESSIVE DISORDER 08/20/2010  . HYPERLIPIDEMIA 04/11/2009  . 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 GERD 03/08/2009    Past Medical History  Diagnosis Date  . Headache(784.0)   . Esophageal reflux   . Diabetes mellitus without mention of complication   . Pure hypercholesterolemia   . Rosacea   . PCOS (polycystic ovarian syndrome) 05/27/2012    Past Surgical History  Procedure Laterality Date  . Cesarean section      triplets and singles  . Appendectomy    . Tubal ligation      Social History   Social History  . Marital Status: Married    Spouse Name: N/A  . Number of Children: N/A  . Years of Education: N/A   Occupational History  . music minister    Social History Main Topics  . Smoking status: Former Smoker    Types: Cigarettes    Quit date: 03/04/2004  . Smokeless tobacco: Never Used  . Alcohol Use: No     Comment: occ  . Drug Use: No  . Sexual Activity: Not on file   Other Topics Concern  . Not on file   Social History Narrative    Family History  Problem Relation Age of Onset  . Cancer Mother     breast  . Heart disease Mother   . Stroke Mother     Allergies  Allergen Reactions  . Lantus [Insulin Glargine] Other (See Comments)    headaches  Medication list reviewed and updated in full in [facility].   GEN: No acute illnesses, no fevers, chills. GI: No n/v/d, eating normally Pulm: No SOB Interactive and getting along well at home.  Otherwise, ROS is as per the HPI.  Objective:   BP 100/76 mmHg  Pulse 86  Temp(Src) 98.2 F (36.8 C) (Oral)  Ht 5\' 7"  (1.702 m)  Wt 178 lb 4 oz (80.854 kg)  BMI 27.91 kg/m2  LMP 08/21/2015 (Approximate)  GEN: WDWN, NAD, Non-toxic, A & O x 3 HEENT: Atraumatic, Normocephalic. Neck supple. No masses, No LAD. Ears and Nose: No external deformity. CV: RRR, No M/G/R. No JVD. No thrill. No extra heart sounds. PULM: CTA B, no wheezes, crackles, rhonchi.  No retractions. No resp. distress. No accessory muscle use. EXTR: No c/c/e NEURO Normal gait.  PSYCH: Normally interactive. Conversant. Not depressed or anxious appearing.  Calm demeanor.   Laboratory and Imaging Data:  Assessment and Plan:   Poorly controlled type 2 diabetes mellitus ([place]) - Plan: Hemoglobin A1c  Moderate episode of recurrent major depressive disorder ([place])  Await a1c - possibly add glipizide  Change to lexapro - mychart me if same issue in 4 weeks  Follow-up: 3 mo  New Prescriptions   ESCITALOPRAM (LEXAPRO) 10 MG TABLET    Take 1 tablet (10 mg total) by mouth daily.   Orders Placed This Encounter  Procedures  . Hemoglobin A1c   Patient Instructions  Sertraline - cut tablets in 1/2 and take 1/2 a tablet a day for one week, then start new prescription of Lexapro.     Signed,  Maud Deed. Liridona Mashaw, MD   Patient's Medications  New Prescriptions   ESCITALOPRAM (LEXAPRO) 10 MG TABLET    Take 1 tablet (10 mg total) by mouth daily.  Previous Medications   CANAGLIFLOZIN (INVOKANA) 100 MG TABS TABLET    Take 100 mg by mouth daily before breakfast.   DICLOFENAC (VOLTAREN) 75 MG EC TABLET    Take 1 tablet (75 mg total) by mouth 2 (two) times daily.   IBUPROFEN (ADVIL,MOTRIN) 200 MG TABLET    Take 400 mg by mouth every 6 (six) hours as needed.   INSULIN PEN NEEDLE (FIFTY50 PEN NEEDLES) 31G X 5 MM MISC    Use 3x a day   METFORMIN (GLUCOPHAGE) 1000 MG TABLET    Take 1 tablet (1,000 mg total) by mouth 2 (two) times daily with a meal.  Modified Medications   No medications on file  Discontinued Medications   AMOXICILLIN (AMOXIL) 500 MG CAPSULE    Take 2 capsules (1,000 mg total) by mouth 2 (two) times daily.   INSULIN DETEMIR (LEVEMIR FLEXPEN) 100 UNIT/ML PEN    Inject 25-30 Units into the skin 2 (two) times daily.   INSULIN LISPRO (HUMALOG KWIKPEN) 100 UNIT/ML KIWKPEN    Inject 0.08-0.16 mLs (8-16 Units total) into the skin 3 (three) times daily with meals.    SERTRALINE (ZOLOFT) 50 MG TABLET    Take 1 tablet (50 mg total) by mouth daily.

## 2015-09-11 NOTE — Telephone Encounter (Signed)
-----   Message from Owens Loffler, MD sent at 09/11/2015  1:53 PM EDT ----- a1c 10.5  Add glipizide 5 mg, 1 po bid. #60, 5 ref  (for the first week, just take 1 tab in the AM and monitor sugars)

## 2015-09-21 ENCOUNTER — Encounter: Payer: Self-pay | Admitting: Family Medicine

## 2015-11-16 ENCOUNTER — Encounter: Payer: Self-pay | Admitting: Internal Medicine

## 2015-11-16 ENCOUNTER — Ambulatory Visit (INDEPENDENT_AMBULATORY_CARE_PROVIDER_SITE_OTHER): Payer: BLUE CROSS/BLUE SHIELD | Admitting: Internal Medicine

## 2015-11-16 VITALS — BP 106/70 | HR 92 | Temp 98.5°F | Wt 183.0 lb

## 2015-11-16 DIAGNOSIS — J0100 Acute maxillary sinusitis, unspecified: Secondary | ICD-10-CM | POA: Diagnosis not present

## 2015-11-16 MED ORDER — CLARITHROMYCIN 250 MG PO TABS
250.0000 mg | ORAL_TABLET | Freq: Two times a day (BID) | ORAL | Status: DC
Start: 2015-11-16 — End: 2015-12-13

## 2015-11-16 NOTE — Patient Instructions (Signed)

## 2015-11-16 NOTE — Progress Notes (Signed)
Pre visit review using our clinic review tool, if applicable. No additional management support is needed unless otherwise documented below in the visit note. 

## 2015-11-16 NOTE — Progress Notes (Signed)
HPI  Pt presents to the clinic today with c/o headache, facial pain and pressure, nasal congestion and sore throat. This started 3 weeks ago. [facility]il 875 mg BID x 10 days. She took the entire course as prescribed, but she has not noticed any improvement. She is not blowing anything out of her nose. She does have pain in her teeth. She denies difficulty swallowing, cough or shortness of breath. She denies fever but has had chills and body aches. She has also tried Sudafed and Ibuprofen. She has no history of allergies or breathing problems. She has not had sick contacts that she is aware of.  Review of Systems    Past Medical History  Diagnosis Date  . Headache(784.0)   . Esophageal reflux   . Diabetes mellitus without mention of complication   . Pure hypercholesterolemia   . Rosacea   . PCOS (polycystic ovarian syndrome) 05/27/2012    Family History  Problem Relation Age of Onset  . Cancer Mother     breast  . Heart disease Mother   . Stroke Mother     Social History   Social History  . Marital Status: Married    Spouse Name: N/A  . Number of Children: N/A  . Years of Education: N/A   Occupational History  . music minister    Social History Main Topics  . Smoking status: Former Smoker    Types: Cigarettes    Quit date: 03/04/2004  . Smokeless tobacco: Never Used  . Alcohol Use: No     Comment: occ  . Drug Use: No  . Sexual Activity: Not on file   Other Topics Concern  . Not on file   Social History Narrative    Allergies  Allergen Reactions  . Lantus [Insulin Glargine] Other (See Comments)    headaches     Constitutional: Positive headache, fatigue. Denies fever or abrupt weight changes.  HEENT:  Positive eye pain, facial pain, nasal congestion and sore throat. Denies eye redness, ear pain, ringing in the ears, wax buildup, runny nose or bloody nose. Respiratory: Denies cough, difficulty breathing or shortness  of breath.  Cardiovascular: Denies chest pain, chest tightness, palpitations or swelling in the hands or feet.   No other specific complaints in a complete review of systems (except as listed in HPI above).  Objective:  BP 106/70 mmHg  Pulse 92  Temp(Src) 98.5 F (36.9 C) (Oral)  Wt 183 lb (83.008 kg)  SpO2 98%   General: Appears her stated age, ill appearing in NAD. HEENT: Head: normal shape and size,  Maxillary sinus tenderness noted; Eyes: sclera white, no icterus, conjunctiva pink; Ears: Tm's pink but intact, normal light reflex, + serous effusion bilaterally; Nose: mucosa boggy and moist, septum midline; Throat/Mouth: + PND. Teeth present, mucosa erythematous and moist, no exudate noted, no lesions or ulcerations noted.  Neck:  No adenopathy noted.  Cardiovascular: Normal rate and rhythm. S1,S2 noted.  No murmur, rubs or gallops noted.  Pulmonary/Chest: Normal effort and positive vesicular breath sounds. No respiratory distress. No wheezes, rales or ronchi noted.      Assessment & Plan:   Acute bacterial sinusitis  Can use a Neti Pot which can be purchased from your local drug store. Flonase 2 sprays each nostril for 3 days and then as needed. Biaxin BID for 10 days If persist, will refer to ENT for further evaluation (3rd sinus infection in 3 months)  RTC as  needed or if symptoms persist.

## 2015-12-13 ENCOUNTER — Ambulatory Visit (INDEPENDENT_AMBULATORY_CARE_PROVIDER_SITE_OTHER): Payer: BLUE CROSS/BLUE SHIELD | Admitting: Family Medicine

## 2015-12-13 ENCOUNTER — Encounter: Payer: Self-pay | Admitting: Family Medicine

## 2015-12-13 VITALS — BP 110/81 | HR 82 | Temp 98.4°F | Ht 67.0 in | Wt 182.2 lb

## 2015-12-13 DIAGNOSIS — F330 Major depressive disorder, recurrent, mild: Secondary | ICD-10-CM | POA: Diagnosis not present

## 2015-12-13 DIAGNOSIS — E1165 Type 2 diabetes mellitus with hyperglycemia: Secondary | ICD-10-CM | POA: Diagnosis not present

## 2015-12-13 LAB — HEMOGLOBIN A1C: HEMOGLOBIN A1C: 8.1 % — AB (ref 4.6–6.5)

## 2015-12-13 MED ORDER — GLIPIZIDE ER 5 MG PO TB24: 5.0000 mg | ORAL_TABLET | Freq: Every day | ORAL | Status: DC

## 2015-12-13 MED ORDER — METFORMIN HCL ER 500 MG PO TB24: 2000.0000 mg | ORAL_TABLET | Freq: Every day | ORAL | Status: DC

## 2015-12-13 NOTE — Progress Notes (Signed)
Dr. Frederico Hamman T. Jenifer Struve, MD, [facility] [place], [address] Phone: [phone] Fax: [phone]  12/13/2015  Patient: Lindsey Mason, MRN: [id number], DOB: 05/20/1971, 45 y.o.  Primary Physician:  Owens Loffler, MD   Chief Complaint  Patient presents with  . Follow-up    3 month   Subjective:   Lindsey Mason is a 45 y.o. very pleasant female patient who presents with the following:  BS is doing a little better Forgetting night time dose of glipizide and metformin  Diabetes Mellitus: Tolerating Medications: yes Compliance with diet: fair Exercise: minimal / intermittent Avg blood sugars at home:Approximately 80 to 1:30 most recently  Foot problems: none Hypoglycemia: none No nausea, vomitting, blurred vision, polyuria.  Lab Results  Component Value Date   HGBA1C 10.5* 09/11/2015   HGBA1C 11.8* 06/14/2015   HGBA1C 11.9* 11/11/2014   Lab Results  Component Value Date   MICROALBUR <0.7 06/14/2015   LDLCALC 88 06/14/2015   CREATININE 0.52 06/14/2015    Wt Readings from Last 3 Encounters:  12/13/15 182 lb 4 oz (82.668 kg)  11/16/15 183 lb (83.008 kg)  09/11/15 178 lb 4 oz (80.854 kg)    Body mass index is 28.54 kg/(m^2).   She ended up stopping her Lexapro, and she thinks that she is doing perfectly okay right now. Less stress with no school in session.  Past Medical History, Surgical History, Social History, Family History, Problem List, Medications, and Allergies have been reviewed and updated if relevant.  Patient Active Problem List   Diagnosis Date Noted  . Poorly controlled type 2 diabetes mellitus ([place]) 03/08/2009    Priority: High  . Acute pharyngitis 07/07/2015  . 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 PCOS (polycystic ovarian syndrome) 05/27/2012  . 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 Major depressive disorder, recurrent episode ([place]) 08/20/2010  . HYPERLIPIDEMIA  04/11/2009  . 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 GERD 03/08/2009    Past Medical History  Diagnosis Date  . Headache(784.0)   . Esophageal reflux   . Diabetes mellitus without mention of complication   . Pure hypercholesterolemia   . Rosacea   . PCOS (polycystic ovarian syndrome) 05/27/2012    Past Surgical History  Procedure Laterality Date  . Cesarean section      triplets and singles  . Appendectomy    . Tubal ligation      Social History   Social History  . Marital Status: Married    Spouse Name: N/A  . Number of Children: N/A  . Years of Education: N/A   Occupational History  . music minister    Social History Main Topics  . Smoking status: Former Smoker    Types: Cigarettes    Quit date: 03/04/2004  . Smokeless tobacco: Never Used  . Alcohol Use: No     Comment: occ  . Drug Use: No  . Sexual Activity: Not on file   Other Topics Concern  . Not on file   Social History Narrative    Family History  Problem Relation Age of Onset  . Cancer Mother     breast  . Heart disease Mother   . Stroke Mother     Allergies  Allergen Reactions  . Lantus [Insulin Glargine] Other (See Comments)    headaches    Medication list reviewed and updated in full in [place].   GEN: No acute illnesses, no fevers, chills. GI: No  n/v/d, eating normally Pulm: No SOB Interactive and getting along well at home.  Otherwise, ROS is as per the HPI.  Objective:   BP 110/81 mmHg  Pulse 82  Temp(Src) 98.4 F (36.9 C) (Oral)  Ht 5\' 7"  (1.702 m)  Wt 182 lb 4 oz (82.668 kg)  BMI 28.54 kg/m2  GEN: WDWN, NAD, Non-toxic, A & O x 3 HEENT: Atraumatic, Normocephalic. Neck supple. No masses, No LAD. Ears and Nose: No external deformity. CV: RRR, No M/G/R. No JVD. No thrill. No extra heart sounds. PULM: CTA B, no wheezes, crackles, rhonchi. No retractions. No resp. distress. No accessory muscle use. EXTR: No c/c/e NEURO Normal gait.  PSYCH: Normally interactive.  Conversant. Not depressed or anxious appearing.  Calm demeanor.   Laboratory and Imaging Data:  Assessment and Plan:   Poorly controlled type 2 diabetes mellitus ([place]) - Plan: Hemoglobin A1c  Mild episode of recurrent major depressive disorder ([place])  Change to extended release forms of oral diabetic medications to ensure compliance, and encouraged her regarding her weight loss.  For now I think watching depression only, she has been doing well.  Follow-up: Return in about 3 months (around 03/14/2016).  New Prescriptions   GLIPIZIDE (GLUCOTROL XL) 5 MG 24 HR TABLET    Take 1 tablet (5 mg total) by mouth daily with breakfast.   METFORMIN (GLUCOPHAGE-XR) 500 MG 24 HR TABLET    Take 4 tablets (2,000 mg total) by mouth daily with breakfast.   Modified Medications   No medications on file   Orders Placed This Encounter  Procedures  . Hemoglobin A1c    Signed,  Tatum Corl T. Bradd Merlos, MD   Patient's Medications  New Prescriptions   GLIPIZIDE (GLUCOTROL XL) 5 MG 24 HR TABLET    Take 1 tablet (5 mg total) by mouth daily with breakfast.   METFORMIN (GLUCOPHAGE-XR) 500 MG 24 HR TABLET    Take 4 tablets (2,000 mg total) by mouth daily with breakfast.  Previous Medications   CANAGLIFLOZIN (INVOKANA) 100 MG TABS TABLET    Take 100 mg by mouth daily before breakfast.   DICLOFENAC (VOLTAREN) 75 MG EC TABLET    Take 1 tablet (75 mg total) by mouth 2 (two) times daily.   IBUPROFEN (ADVIL,MOTRIN) 200 MG TABLET    Take 400 mg by mouth every 6 (six) hours as needed.   INSULIN PEN NEEDLE (FIFTY50 PEN NEEDLES) 31G X 5 MM MISC    Use 3x a day  Modified Medications   No medications on file  Discontinued Medications   CLARITHROMYCIN (BIAXIN) 250 MG TABLET    Take 1 tablet (250 mg total) by mouth 2 (two) times daily.   ESCITALOPRAM (LEXAPRO) 10 MG TABLET    Take 1 tablet (10 mg total) by mouth daily.   GLIPIZIDE (GLUCOTROL) 5 MG TABLET    Take 1 tablet (5 mg total) by mouth 2 (two) times daily before  a meal.   METFORMIN (GLUCOPHAGE) 1000 MG TABLET    Take 1 tablet (1,000 mg total) by mouth 2 (two) times daily with a meal.

## 2015-12-13 NOTE — Progress Notes (Signed)
Pre visit review using our clinic review tool, if applicable. No additional management support is needed unless otherwise documented below in the visit note. 

## 2016-01-15 ENCOUNTER — Encounter: Payer: Self-pay | Admitting: Family Medicine

## 2016-01-15 ENCOUNTER — Other Ambulatory Visit: Payer: BLUE CROSS/BLUE SHIELD

## 2016-01-15 ENCOUNTER — Ambulatory Visit (INDEPENDENT_AMBULATORY_CARE_PROVIDER_SITE_OTHER): Payer: BLUE CROSS/BLUE SHIELD | Admitting: Family Medicine

## 2016-01-15 VITALS — BP 114/82 | HR 79 | Temp 97.9°F | Ht 67.0 in | Wt 180.2 lb

## 2016-01-15 DIAGNOSIS — I9589 Other hypotension: Secondary | ICD-10-CM

## 2016-01-15 DIAGNOSIS — N921 Excessive and frequent menstruation with irregular cycle: Secondary | ICD-10-CM

## 2016-01-15 DIAGNOSIS — Z7721 Contact with and (suspected) exposure to potentially hazardous body fluids: Secondary | ICD-10-CM | POA: Diagnosis not present

## 2016-01-15 MED ORDER — MEDROXYPROGESTERONE ACETATE 5 MG PO TABS: ORAL_TABLET | ORAL | Status: DC

## 2016-01-15 NOTE — Progress Notes (Signed)
Pre visit review using our clinic review tool, if applicable. No additional management support is needed unless otherwise documented below in the visit note. 

## 2016-01-15 NOTE — Progress Notes (Signed)
Dr. Frederico Hamman T. Tondalaya Perren, MD, [facility] [place], [address] Phone: [phone] Fax: [phone]  01/15/2016  Patient: Lindsey Mason, MRN: [id number], DOB: 12-05-1970, 45 y.o.  Primary Physician:  Owens Loffler, MD   Chief Complaint  Patient presents with  . Dizziness  . Nausea  . Leg Numbness  . Rash    Right Leg  . Menstrual Problem   Subjective:   Lindsey Mason is a 45 y.o. very pleasant female patient who presents with the following:  Feeling queezy and dizzy sometimes.   BP - was dropping low 89/50, but then back up to normal some. When normal, she felt OK.   1st week of July.  Had it again and very  BTL.  She has also had some heavy menses, only about 15 days after her last cycle.   Occ leg numbness depending how she has slept  Husband had a different partner when they were separated.  Past Medical History, Surgical History, Social History, Family History, Problem List, Medications, and Allergies have been reviewed and updated if relevant.  Patient Active Problem List   Diagnosis Date Noted  . Poorly controlled type 2 diabetes mellitus ([place]) 03/08/2009    Priority: High  . 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 PCOS (polycystic ovarian syndrome) 05/27/2012  . 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 Major depressive disorder, recurrent episode ([place]) 08/20/2010  . HYPERLIPIDEMIA 04/11/2009  . 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 GERD 03/08/2009    Past Medical History  Diagnosis Date  . Headache(784.0)   . Esophageal reflux   . Diabetes mellitus without mention of complication   . Pure hypercholesterolemia   . Rosacea   . PCOS (polycystic ovarian syndrome) 05/27/2012    Past Surgical History  Procedure Laterality Date  . Cesarean section      triplets and singles  . Appendectomy    . Tubal ligation      Social History   Social History  . Marital Status: Married    Spouse  Name: N/A  . Number of Children: N/A  . Years of Education: N/A   Occupational History  . music minister    Social History Main Topics  . Smoking status: Former Smoker    Types: Cigarettes    Quit date: 03/04/2004  . Smokeless tobacco: Never Used  . Alcohol Use: No     Comment: occ  . Drug Use: No  . Sexual Activity: Not on file   Other Topics Concern  . Not on file   Social History Narrative    Family History  Problem Relation Age of Onset  . Cancer Mother     breast  . Heart disease Mother   . Stroke Mother     Allergies  Allergen Reactions  . Lantus [Insulin Glargine] Other (See Comments)    headaches    Medication list reviewed and updated in full in [place].   GEN: No acute illnesses, no fevers, chills. GI: No n/v/d, eating normally Pulm: No SOB Interactive and getting along well at home.  Otherwise, ROS is as per the HPI.  Objective:   BP 114/82 mmHg  Pulse 79  Temp(Src) 97.9 F (36.6 C) (Oral)  Ht 5\' 7"  (1.702 m)  Wt 180 lb 4 oz (81.761 kg)  BMI 28.22 kg/m2  GEN: WDWN, NAD, Non-toxic, A & O x 3 HEENT: Atraumatic, Normocephalic. Neck supple. No masses, No LAD. Ears and  Nose: No external deformity. CV: RRR, No M/G/R. No JVD. No thrill. No extra heart sounds. PULM: CTA B, no wheezes, crackles, rhonchi. No retractions. No resp. distress. No accessory muscle use. EXTR: No c/c/e NEURO Normal gait.  PSYCH: Normally interactive. Conversant. Not depressed or anxious appearing.  Calm demeanor.   Laboratory and Imaging Data:  Assessment and Plan:   Other specified hypotension  Hx of exposure to hazardous bodily fluids - Plan: HIV antibody, RPR, GC/Chlamydia Probe Amp, CANCELED: GC/Chlamydia Probe Amp  Menorrhagia with irregular cycle  Add plenty of salt and drink fluids this summer  Check for stds  Provera, if not resolved gyn f/u  Follow-up: regular appt  New Prescriptions   MEDROXYPROGESTERONE (PROVERA) 5 MG TABLET    Start  day 16 of cycle, then take 1 tab per day   Orders Placed This Encounter  Procedures  . GC/Chlamydia Probe Amp  . HIV antibody  . RPR    Signed,  Frederico Hamman T. Larz Mark, MD   Patient's Medications  New Prescriptions   MEDROXYPROGESTERONE (PROVERA) 5 MG TABLET    Start day 16 of cycle, then take 1 tab per day  Previous Medications   CANAGLIFLOZIN (INVOKANA) 100 MG TABS TABLET    Take 100 mg by mouth daily before breakfast.   DICLOFENAC (VOLTAREN) 75 MG EC TABLET    Take 1 tablet (75 mg total) by mouth 2 (two) times daily.   GLIPIZIDE (GLUCOTROL XL) 5 MG 24 HR TABLET    Take 1 tablet (5 mg total) by mouth daily with breakfast.   IBUPROFEN (ADVIL,MOTRIN) 200 MG TABLET    Take 400 mg by mouth every 6 (six) hours as needed.   INSULIN PEN NEEDLE (FIFTY50 PEN NEEDLES) 31G X 5 MM MISC    Use 3x a day   METFORMIN (GLUCOPHAGE-XR) 500 MG 24 HR TABLET    Take 4 tablets (2,000 mg total) by mouth daily with breakfast.  Modified Medications   No medications on file  Discontinued Medications   No medications on file

## 2016-01-16 LAB — HIV ANTIBODY (ROUTINE TESTING W REFLEX): HIV 1&2 Ab, 4th Generation: NONREACTIVE

## 2016-01-16 LAB — GC/CHLAMYDIA PROBE AMP
CT PROBE, AMP APTIMA: NOT DETECTED
GC Probe RNA: NOT DETECTED

## 2016-01-16 LAB — RPR

## 2016-01-22 ENCOUNTER — Other Ambulatory Visit: Payer: Self-pay | Admitting: Internal Medicine

## 2016-01-22 ENCOUNTER — Encounter: Payer: Self-pay | Admitting: Family Medicine

## 2016-01-22 MED ORDER — CANAGLIFLOZIN 100 MG PO TABS: 100.0000 mg | ORAL_TABLET | Freq: Every day | ORAL | 5 refills | Status: DC

## 2016-01-25 ENCOUNTER — Encounter: Payer: Self-pay | Admitting: Family Medicine

## 2016-03-14 ENCOUNTER — Ambulatory Visit (INDEPENDENT_AMBULATORY_CARE_PROVIDER_SITE_OTHER): Payer: BLUE CROSS/BLUE SHIELD | Admitting: Family Medicine

## 2016-03-14 ENCOUNTER — Encounter: Payer: Self-pay | Admitting: Family Medicine

## 2016-03-14 VITALS — BP 104/70 | HR 82 | Temp 98.4°F | Ht 67.0 in | Wt 187.5 lb

## 2016-03-14 DIAGNOSIS — E1165 Type 2 diabetes mellitus with hyperglycemia: Secondary | ICD-10-CM | POA: Diagnosis not present

## 2016-03-14 DIAGNOSIS — Z23 Encounter for immunization: Secondary | ICD-10-CM

## 2016-03-14 LAB — BASIC METABOLIC PANEL
BUN: 12 mg/dL (ref 6–23)
CALCIUM: 8.9 mg/dL (ref 8.4–10.5)
CHLORIDE: 103 meq/L (ref 96–112)
CO2: 31 mEq/L (ref 19–32)
CREATININE: 0.46 mg/dL (ref 0.40–1.20)
GFR: 155.4 mL/min (ref >60.00)
Glucose, Bld: 202 mg/dL — ABNORMAL HIGH (ref 70–99)
Potassium: 4 mEq/L (ref 3.5–5.1)
Sodium: 137 mEq/L (ref 135–145)

## 2016-03-14 LAB — HEMOGLOBIN A1C: HEMOGLOBIN A1C: 7.2 % — AB (ref 4.6–6.5)

## 2016-03-14 NOTE — Progress Notes (Signed)
Pre visit review using our clinic review tool, if applicable. No additional management support is needed unless otherwise documented below in the visit note. 

## 2016-03-14 NOTE — Progress Notes (Signed)
Dr. Frederico Hamman T. Ngina Royer, MD, [facility] [place], [address] Phone: [phone] Fax: [phone]  03/14/2016  Patient: Lindsey Mason, MRN: [id number], DOB: 02-24-1971, 45 y.o.  Primary Physician:  Owens Loffler, MD   Chief Complaint  Patient presents with  . Follow-up    3 month   Subjective:   Lindsey Mason is a 45 y.o. very pleasant female patient who presents with the following:  F/u DM She reports her blood sugars have been well controlled. She has continued to lose weight, and she has been very compliant with taking all of her medications and not really had any significant difficulties. She has not had any blurred vision or nausea.  Church, Barrister's clerk at [employer].   BS has had some     Past Medical History, Surgical History, Social History, Family History, Problem List, Medications, and Allergies have been reviewed and updated if relevant.  Patient Active Problem List   Diagnosis Date Noted  . Poorly controlled type 2 diabetes mellitus ([place]) 03/08/2009    Priority: High  . 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 PCOS (polycystic ovarian syndrome) 05/27/2012  . 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 Major depressive disorder, recurrent episode ([place]) 08/20/2010  . HYPERLIPIDEMIA 04/11/2009  . 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 GERD 03/08/2009    Past Medical History:  Diagnosis Date  . Diabetes mellitus without mention of complication   . Esophageal reflux   . Headache(784.0)   . PCOS (polycystic ovarian syndrome) 05/27/2012  . Pure hypercholesterolemia   . Rosacea     Past Surgical History:  Procedure Laterality Date  . APPENDECTOMY    . CESAREAN SECTION     triplets and singles  . TUBAL LIGATION      Social History   Social History  . Marital status: Married    Spouse name: N/A  . Number of children: N/A  . 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 Smoking status: Former Smoker    Types: Cigarettes    Quit date: 03/04/2004  . Smokeless tobacco: Never Used  . Alcohol use No     Comment: occ  . Drug use: No  . Sexual activity: Not on file   Other Topics Concern  . Not on file   Social History Narrative  . No narrative on file    Family History  Problem Relation Age of Onset  . Cancer Mother     breast  . Heart disease Mother   . Stroke Mother     Allergies  Allergen Reactions  . Lantus [Insulin Glargine] Other (See Comments)    headaches    Medication list reviewed and updated in full in [place].   GEN: No acute illnesses, no fevers, chills. GI: No n/v/d, eating normally Pulm: No SOB Interactive and getting along well at home.  Otherwise, ROS is as per the HPI.  Objective:   BP 104/70   Pulse 82   Temp 98.4 F (36.9 C) (Oral)   Ht 5\' 7"  (1.702 m)   Wt 187 lb 8 oz (85 kg)   BMI 29.37 kg/m   GEN: WDWN, NAD, Non-toxic, A & O x 3 HEENT: Atraumatic, Normocephalic. Neck supple. No masses, No LAD. Ears and Nose: No external deformity. CV: RRR, No M/G/R. No JVD. No thrill. No extra heart sounds.  PULM: CTA B, no wheezes, crackles, rhonchi. No retractions. No resp. distress. No accessory muscle use. EXTR: No c/c/e NEURO Normal gait.  PSYCH: Normally interactive. Conversant. Not depressed or anxious appearing.  Calm demeanor.   Laboratory and Imaging Data:  Assessment and Plan:   Poorly controlled type 2 diabetes mellitus ([place]) - Plan: Basic metabolic panel, Hemoglobin A1c  Need for prophylactic vaccination and inoculation against influenza - Plan: Flu Vaccine QUAD 36+ mos IM  Check labs and adjust medication as needed.  Follow-up: Return in about 4 months (around 07/14/2016) for 30 min CPX.  Orders Placed This Encounter  Procedures  . Flu Vaccine QUAD 36+ mos IM  . Basic metabolic panel  . Hemoglobin A1c     Signed,  Hendrix Yurkovich T. Delrico Minehart, MD   Patient's Medications  New Prescriptions   No medications on file  Previous Medications   CANAGLIFLOZIN (INVOKANA) 100 MG TABS TABLET    Take 1 tablet (100 mg total) by mouth daily before breakfast.   DICLOFENAC (VOLTAREN) 75 MG EC TABLET    Take 1 tablet (75 mg total) by mouth 2 (two) times daily.   GLIPIZIDE (GLUCOTROL XL) 5 MG 24 HR TABLET    Take 1 tablet (5 mg total) by mouth daily with breakfast.   IBUPROFEN (ADVIL,MOTRIN) 200 MG TABLET    Take 400 mg by mouth every 6 (six) hours as needed.   MEDROXYPROGESTERONE (PROVERA) 5 MG TABLET    Start day 16 of cycle, then take 1 tab per day   METFORMIN (GLUCOPHAGE-XR) 500 MG 24 HR TABLET    Take 4 tablets (2,000 mg total) by mouth daily with breakfast.  Modified Medications   No medications on file  Discontinued Medications   INSULIN PEN NEEDLE (FIFTY50 PEN NEEDLES) 31G X 5 MM MISC    Use 3x a day

## 2016-03-18 ENCOUNTER — Other Ambulatory Visit: Payer: Self-pay | Admitting: *Deleted

## 2016-03-18 MED ORDER — CANAGLIFLOZIN 100 MG PO TABS: 100.0000 mg | ORAL_TABLET | Freq: Every day | ORAL | 1 refills | Status: DC

## 2016-04-02 ENCOUNTER — Ambulatory Visit (INDEPENDENT_AMBULATORY_CARE_PROVIDER_SITE_OTHER): Payer: BLUE CROSS/BLUE SHIELD | Admitting: Family Medicine

## 2016-04-02 ENCOUNTER — Encounter: Payer: Self-pay | Admitting: Family Medicine

## 2016-04-02 VITALS — BP 122/80 | HR 98 | Temp 98.5°F | Wt 183.2 lb

## 2016-04-02 DIAGNOSIS — R300 Dysuria: Secondary | ICD-10-CM

## 2016-04-02 DIAGNOSIS — R350 Frequency of micturition: Secondary | ICD-10-CM

## 2016-04-02 LAB — POC URINALSYSI DIPSTICK (AUTOMATED)
BILIRUBIN UA: NEGATIVE
Blood, UA: NEGATIVE
LEUKOCYTES UA: NEGATIVE
Nitrite, UA: NEGATIVE
Protein, UA: NEGATIVE
Urobilinogen, UA: 0.2
pH, UA: 5.5

## 2016-04-02 MED ORDER — SULFAMETHOXAZOLE-TRIMETHOPRIM 400-80 MG PO TABS: 1.0000 | ORAL_TABLET | Freq: Two times a day (BID) | ORAL | 0 refills | Status: DC

## 2016-04-02 NOTE — Progress Notes (Signed)
Pre visit review using our clinic review tool, if applicable. No additional management support is needed unless otherwise documented below in the visit note. 

## 2016-04-02 NOTE — Patient Instructions (Signed)
Drink plenty of water and start the antibiotics today.  We'll contact you with your lab report.  Take care.   

## 2016-04-02 NOTE — Progress Notes (Signed)
In the last week she had lower back pain on L side.  She thought it was a pulled muscle initially.  Constant pain.  Urinary frequency, no burning with urination but noted an abnormal odor to urine.   No R sided pain.  No fevers, no vomiting.  Did have chills recently.  No h/o renal stones.    No diarrhea recently.  No blood in stool.  No blood in urine.    Meds, vitals, and allergies reviewed.   ROS: Per HPI unless specifically indicated in ROS section   GEN: nad, alert and oriented HEENT: mucous membranes moist NECK: supple w/o LA CV: rrr.  PULM: ctab, no inc wob ABD: soft, +bs, not ttp EXT: no edema

## 2016-04-03 DIAGNOSIS — R350 Frequency of micturition: Secondary | ICD-10-CM | POA: Insufficient documentation

## 2016-04-03 NOTE — Assessment & Plan Note (Signed)
Her sx and u/a may be affected by invokana, but her A1c is improved and she still could have UTI, d/w pt.  Likely not pyelo with no CVA pain and no fever.  Would check ucx, start septra, and inc fluids.  D/w pt.  Okay for outpatient f/u.  Not likely to be a renal stone or diverticulitis, based on exam and lack of GI sx or hematuria.

## 2016-04-04 LAB — URINE CULTURE

## 2016-04-05 ENCOUNTER — Encounter: Payer: Self-pay | Admitting: Family Medicine

## 2016-04-17 ENCOUNTER — Encounter: Payer: Self-pay | Admitting: Family Medicine

## 2016-04-17 MED ORDER — SULFAMETHOXAZOLE-TRIMETHOPRIM 400-80 MG PO TABS: 1.0000 | ORAL_TABLET | Freq: Two times a day (BID) | ORAL | 0 refills | Status: DC

## 2016-04-17 NOTE — Telephone Encounter (Signed)
I would just lengthen course of abx  Septra SS 1 po bid x 7 days, #14  Electronically Signed  By: Owens Loffler, MD On: 04/17/2016 1:12 PM

## 2016-05-01 ENCOUNTER — Other Ambulatory Visit: Payer: Self-pay | Admitting: Family Medicine

## 2016-05-01 ENCOUNTER — Encounter: Payer: Self-pay | Admitting: Family Medicine

## 2016-05-01 DIAGNOSIS — Z1231 Encounter for screening mammogram for malignant neoplasm of breast: Secondary | ICD-10-CM

## 2016-05-30 ENCOUNTER — Encounter: Payer: Self-pay | Admitting: Family Medicine

## 2016-05-30 ENCOUNTER — Ambulatory Visit (INDEPENDENT_AMBULATORY_CARE_PROVIDER_SITE_OTHER): Payer: BLUE CROSS/BLUE SHIELD | Admitting: Family Medicine

## 2016-05-30 VITALS — BP 136/70 | HR 85 | Temp 98.0°F | Wt 183.2 lb

## 2016-05-30 DIAGNOSIS — M549 Dorsalgia, unspecified: Secondary | ICD-10-CM | POA: Diagnosis not present

## 2016-05-30 DIAGNOSIS — N3090 Cystitis, unspecified without hematuria: Secondary | ICD-10-CM | POA: Diagnosis not present

## 2016-05-30 DIAGNOSIS — M720 Palmar fascial fibromatosis [Dupuytren]: Secondary | ICD-10-CM | POA: Diagnosis not present

## 2016-05-30 LAB — POC URINALSYSI DIPSTICK (AUTOMATED)
Bilirubin, UA: NEGATIVE
Blood, UA: NEGATIVE
Leukocytes, UA: NEGATIVE
Nitrite, UA: NEGATIVE
Protein, UA: NEGATIVE
Spec Grav, UA: 1.025
Urobilinogen, UA: 0.2
pH, UA: 6

## 2016-05-30 MED ORDER — SULFAMETHOXAZOLE-TRIMETHOPRIM 400-80 MG PO TABS: 1.0000 | ORAL_TABLET | Freq: Two times a day (BID) | ORAL | 0 refills | Status: DC

## 2016-05-30 NOTE — Patient Instructions (Addendum)

## 2016-05-30 NOTE — Progress Notes (Signed)
Subjective:    Patient ID: Lindsey Mason, female    DOB: May 18, 1971, 45 y.o.   MRN: [id number]  HPI This is a 45 yo female who presents today with back pain for 5 days and nausea. Symptoms similar to cystitis symptoms two months ago. Had fever two nights ago to 100. Has been on Invokana for about a year. No dysuria, but has had frequency and urgency in last couple of days. Nocturia x 2. When she had similar symptoms 10/17, urine dip was negative but culture grew klebsiella pneumoniae.   Has noticed some swelling on top of left hand. Has history of Dupuytren's  Contracture and saw hand specialist in [place] and received injections. She is a Designer, television/film set, Programmer, systems. Currently not interfering with her ability to play.   Past Medical History:  Diagnosis Date  . Diabetes mellitus without mention of complication   . Esophageal reflux   . Headache(784.0)   . PCOS (polycystic ovarian syndrome) 05/27/2012  . Pure hypercholesterolemia   . Rosacea    Past Surgical History:  Procedure Laterality Date  . APPENDECTOMY    . CESAREAN SECTION     triplets and singles  . TUBAL LIGATION     Family History  Problem Relation Age of Onset  . Cancer Mother     breast  . Heart disease Mother   . Stroke Mother    Social History  Substance Use Topics  . Smoking status: Former Smoker    Types: Cigarettes    Quit date: 03/04/2004  . Smokeless tobacco: Never Used  . Alcohol use No     Comment: occ      Review of Systems Per HPI    Objective:   Physical Exam  Constitutional: She is oriented to person, place, and time. She appears well-developed and well-nourished. No distress.  HENT:  Head: Normocephalic.  Eyes: Conjunctivae are normal.  Cardiovascular: Normal rate.   Pulmonary/Chest: Effort normal.  Abdominal: Soft. She exhibits no distension. There is no tenderness. There is no rebound, no guarding and no CVA tenderness.  Musculoskeletal:  Palmar atrophy. Good ROM.     Neurological: She is alert and oriented to person, place, and time.  Skin: Skin is warm and dry. She is not diaphoretic.  Psychiatric: She has a normal mood and affect. Her behavior is normal. Judgment and thought content normal.  Vitals reviewed.     BP 136/70   Pulse 85   Temp 98 F (36.7 C) (Oral)   Wt 183 lb 4 oz (83.1 kg)   SpO2 98%   BMI 28.70 kg/m  Wt Readings from Last 3 Encounters:  05/30/16 183 lb 4 oz (83.1 kg)  04/02/16 183 lb 4 oz (83.1 kg)  03/14/16 187 lb 8 oz (85 kg)   Results for orders placed or performed in visit on 05/30/16  POCT Urinalysis Dipstick (Automated)  Result Value Ref Range   Color, UA yellow    Clarity, UA clear    Glucose, UA 3+    Bilirubin, UA neg    Ketones, UA 1+    Spec Grav, UA 1.025    Blood, UA neg    pH, UA 6.0    Protein, UA neg    Urobilinogen, UA 0.2    Nitrite, UA neg    Leukocytes, UA Negative Negative       Assessment & Plan:  1. Back pain, unspecified back location, unspecified back pain laterality, unspecified chronicity - POCT Urinalysis Dipstick (Automated) -  Urine culture  2. Cystitis - will treat presumptively and check culture based on previous symptoms/culture results - Provided written and verbal information regarding diagnosis and treatment. - RTC precautions reviewed - sulfamethoxazole-trimethoprim (BACTRIM) 400-80 MG tablet; Take 1 tablet by mouth 2 (two) times daily.  Dispense: 14 tablet; Refill: 0  3. Dupuytren's contracture of left hand - she is unsure if she will continue follow up with specialist in [place]; currently not interfering with function   Clarene Reamer, FNP-BC  [facility], [place]  05/31/2016 9:26 PM

## 2016-05-30 NOTE — Progress Notes (Signed)
Pre visit review using our clinic review tool, if applicable. No additional management support is needed unless otherwise documented below in the visit note. 

## 2016-06-02 LAB — URINE CULTURE

## 2016-06-04 ENCOUNTER — Ambulatory Visit
Admission: RE | Admit: 2016-06-04 | Discharge: 2016-06-04 | Disposition: A | Discharge status: Home / Self Care | Payer: BLUE CROSS/BLUE SHIELD | Source: Ambulatory Visit | Attending: Family Medicine | Admitting: Family Medicine

## 2016-06-04 DIAGNOSIS — Z1231 Encounter for screening mammogram for malignant neoplasm of breast: Secondary | ICD-10-CM

## 2016-07-12 ENCOUNTER — Other Ambulatory Visit: Payer: Self-pay

## 2016-07-12 DIAGNOSIS — R5383 Other fatigue: Secondary | ICD-10-CM

## 2016-07-12 DIAGNOSIS — Z79899 Other long term (current) drug therapy: Secondary | ICD-10-CM

## 2016-07-12 DIAGNOSIS — E119 Type 2 diabetes mellitus without complications: Secondary | ICD-10-CM

## 2016-07-12 DIAGNOSIS — E785 Hyperlipidemia, unspecified: Secondary | ICD-10-CM

## 2016-07-17 ENCOUNTER — Encounter: Payer: Self-pay | Admitting: Family Medicine

## 2016-08-13 ENCOUNTER — Ambulatory Visit (INDEPENDENT_AMBULATORY_CARE_PROVIDER_SITE_OTHER): Payer: BLUE CROSS/BLUE SHIELD | Admitting: Family Medicine

## 2016-08-13 ENCOUNTER — Encounter: Payer: Self-pay | Admitting: Family Medicine

## 2016-08-13 VITALS — BP 110/80 | HR 93 | Temp 98.6°F | Ht 67.0 in | Wt 185.6 lb

## 2016-08-13 DIAGNOSIS — J069 Acute upper respiratory infection, unspecified: Secondary | ICD-10-CM | POA: Diagnosis not present

## 2016-08-13 DIAGNOSIS — B9789 Other viral agents as the cause of diseases classified elsewhere: Secondary | ICD-10-CM

## 2016-08-13 MED ORDER — HYDROCODONE-HOMATROPINE 5-1.5 MG/5ML PO SYRP: 5.0000 mL | ORAL_SOLUTION | Freq: Three times a day (TID) | ORAL | 0 refills | Status: DC | PRN

## 2016-08-13 NOTE — Patient Instructions (Signed)
Hydromet........[name] 1/2-1 teaspoon at bedtime........... if you get up at midnight coughing and hacking drink some water take another half teaspoon or so and go back to bed  Drink lots of water  Do not take any over-the-counter cough and cold medicines....... they do no good  For nasal congestion we recommend the following............ saline nasal spray with a Nettie pot......... one shot of Afrin up each nostril tilt  head back over the pillow for 5 minutes,,,,,,,,,, then one shot of steroid nasal spray up each nostril  After 5 nights stop the Afrin but continue the saline irrigation and the steroid nasal spray until your well

## 2016-08-13 NOTE — Progress Notes (Signed)
Pre visit review using our clinic review tool, if applicable. No additional management support is needed unless otherwise documented below in the visit note. 

## 2016-08-13 NOTE — Progress Notes (Signed)
Lindsey Mason is a 46 year old female nonsmoker.......[name] English as a second language teacher at [employer]....... who comes in today with a five-day history of head congestion sore throat and cough. No fever chills.  Review of systems negative  BP 110/80 (BP Location: Left Arm, Patient Position: Sitting, Cuff Size: Normal)   Pulse 93   Temp 98.6 F (37 C) (Oral)   Ht 5\' 7"  (1.702 m)   Wt 185 lb 9.6 oz (84.2 kg)   BMI 29.07 kg/m  General she is a well-developed well-nourished female no acute distress vital signs stable she's afebrile HEENT were negative neck was supple no adenopathy lungs are clear  #1 viral syndrome with cough........ treat symptomatically with fluids, Hydromet, nasal irrigation with saline Afrin and steroid nasal spray

## 2016-08-19 ENCOUNTER — Encounter: Payer: Self-pay | Admitting: Family Medicine

## 2016-08-19 NOTE — Telephone Encounter (Signed)
I left a message on patient's voice mail asking her to call me back to schedule appointment.

## 2016-08-19 NOTE — Telephone Encounter (Signed)
Morey Hummingbird,   Can you please offer the patient my 4:15 PM appointment on Wednesday - ok to open it up.   If she really wants to be seen tomorrow, then one of my partners would be happy to see her at [facility].

## 2016-08-20 ENCOUNTER — Ambulatory Visit (INDEPENDENT_AMBULATORY_CARE_PROVIDER_SITE_OTHER): Payer: BLUE CROSS/BLUE SHIELD | Admitting: Internal Medicine

## 2016-08-20 ENCOUNTER — Encounter: Payer: Self-pay | Admitting: Internal Medicine

## 2016-08-20 VITALS — BP 120/78 | HR 89 | Temp 98.1°F | Wt 185.5 lb

## 2016-08-20 DIAGNOSIS — J069 Acute upper respiratory infection, unspecified: Secondary | ICD-10-CM

## 2016-08-20 MED ORDER — BENZONATATE 200 MG PO CAPS: 200.0000 mg | ORAL_CAPSULE | Freq: Three times a day (TID) | ORAL | 0 refills | Status: DC | PRN

## 2016-08-20 MED ORDER — AZITHROMYCIN 250 MG PO TABS: ORAL_TABLET | ORAL | 0 refills | Status: DC

## 2016-08-20 NOTE — Progress Notes (Signed)
HPI  Pt presents to the clinic today with c/o cough and chest congestion. This started. She was seen 08/13/16 by Dr. Sherren Mocha. He diagnosed her with a Viral URI with Cough. He advised her to rest, drink fluids, take cough syrup and use nasal saline OTC. Since that time, she reports she has ongoing headache, runny nose, ear fullness, cough and chest congestion. She is blowing blood tinged yellow mucous out of her nose. The cough is productive of yellow/green mucous. She denies fever, chills or body aches. She has tried Mucinex, Mudlogger with minimal relief. She has no history of allergies or asthma. She has had sick contacts. Her flu shot is UTD.  Review of Systems        Past Medical History:  Diagnosis Date  . Diabetes mellitus without mention of complication   . Esophageal reflux   . Headache(784.0)   . PCOS (polycystic ovarian syndrome) 05/27/2012  . Pure hypercholesterolemia   . Rosacea     Family History  Problem Relation Age of Onset  . Cancer Mother     breast  . Heart disease Mother   . Stroke Mother     Social History   Social History  . Marital status: Married    Spouse name: N/A  . Number of children: N/A  . 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 Smoking status: Former Smoker    Types: Cigarettes    Quit date: 03/04/2004  . Smokeless tobacco: Never Used  . Alcohol use No     Comment: occ  . Drug use: No  . Sexual activity: Not on file   Other Topics Concern  . Not on file   Social History Narrative  . No narrative on file    Allergies  Allergen Reactions  . Lantus [Insulin Glargine] Other (See Comments)    headaches     Constitutional: Positive headache. Denies fatigue, fever or abrupt weight changes.  HEENT:  Positive ear fullness, runny nose. Denies eye redness, eye pain, pressure behind the eyes, facial pain, nasal congestion, ear pain, ringing in the ears, wax  buildup, or bloody nose. Respiratory: Positive cough. Denies difficulty breathing or shortness of breath.  Cardiovascular: Denies chest pain, chest tightness, palpitations or swelling in the hands or feet.   No other specific complaints in a complete review of systems (except as listed in HPI above).  Objective:   BP 120/78   Pulse 89   Temp 98.1 F (36.7 C) (Oral)   Wt 185 lb 8 oz (84.1 kg)   SpO2 98%   BMI 29.05 kg/m   Wt Readings from Last 3 Encounters:  08/13/16 185 lb 9.6 oz (84.2 kg)  05/30/16 183 lb 4 oz (83.1 kg)  04/02/16 183 lb 4 oz (83.1 kg)     General: Appears her stated age, well developed, well nourished in NAD. HEENT: Head: normal shape and size, no sinus tenderness noted; Ears: Tm's gray and intact, normal light reflex; Nose: mucosa pink and moist, septum midline; Throat/Mouth: + PND. Teeth present, mucosa erythematous and moist, no exudate noted, no lesions or ulcerations noted.  Neck: No cervical lymphadenopathy.  Cardiovascular: Normal rate and rhythm.  Pulmonary/Chest: Normal effort and positive vesicular breath sounds. No respiratory distress. No wheezes, rales or ronchi noted.       Assessment & Plan:   Upper Respiratory Infection:  Get some rest and drink plenty of water  Do salt water gargles for the sore throat eRx for Azithromax x 5 days eRx for Tessalon Pearls  RTC as needed or if symptoms persist.   Webb Silversmith, NP

## 2016-08-20 NOTE — Patient Instructions (Signed)
Upper Respiratory Infection, Adult Most upper respiratory infections (URIs) are caused by a virus. A URI affects the nose, throat, and upper air passages. The most common type of URI is often called "the common cold." Follow these instructions at home:  Take medicines only as told by your doctor.  Gargle warm saltwater or take cough drops to comfort your throat as told by your doctor.  Use a warm mist humidifier or inhale steam from a shower to increase air moisture. This may make it easier to breathe.  Drink enough fluid to keep your pee (urine) clear or pale yellow.  Eat soups and other clear broths.  Have a healthy diet.  Rest as needed.  Go back to work when your fever is gone or your doctor says it is okay.  You may need to stay home longer to avoid giving your URI to others.  You can also wear a face mask and wash your hands often to prevent spread of the virus.  Use your inhaler more if you have asthma.  Do not use any tobacco products, including cigarettes, chewing tobacco, or electronic cigarettes. If you need help quitting, ask your doctor. Contact a doctor if:  You are getting worse, not better.  Your symptoms are not helped by medicine.  You have chills.  You are getting more short of breath.  You have brown or red mucus.  You have yellow or brown discharge from your nose.  You have pain in your face, especially when you bend forward.  You have a fever.  You have puffy (swollen) neck glands.  You have pain while swallowing.  You have white areas in the back of your throat. Get help right away if:  You have very bad or constant:  Headache.  Ear pain.  Pain in your forehead, behind your eyes, and over your cheekbones (sinus pain).  Chest pain.  You have long-lasting (chronic) lung disease and any of the following:  Wheezing.  Long-lasting cough.  Coughing up blood.  A change in your usual mucus.  You have a stiff neck.  You have  changes in your:  Vision.  Hearing.  Thinking.  Mood. This information is not intended to replace advice given to you by your health care provider. Make sure you discuss any questions you have with your health care provider. Document Released: 12/04/2007 Document Revised: 02/18/2016 Document Reviewed: 09/22/2013 Elsevier Interactive Patient Education  2017 Elsevier Inc.  

## 2016-09-03 DIAGNOSIS — F329 Major depressive disorder, single episode, unspecified: Secondary | ICD-10-CM | POA: Insufficient documentation

## 2016-09-03 DIAGNOSIS — F32A Depression, unspecified: Secondary | ICD-10-CM | POA: Insufficient documentation

## 2016-09-03 DIAGNOSIS — E1165 Type 2 diabetes mellitus with hyperglycemia: Secondary | ICD-10-CM | POA: Insufficient documentation

## 2016-09-03 DIAGNOSIS — E663 Overweight: Secondary | ICD-10-CM | POA: Insufficient documentation

## 2016-09-03 DIAGNOSIS — R635 Abnormal weight gain: Secondary | ICD-10-CM | POA: Insufficient documentation

## 2016-09-03 DIAGNOSIS — R0681 Apnea, not elsewhere classified: Secondary | ICD-10-CM | POA: Insufficient documentation

## 2016-09-20 ENCOUNTER — Encounter: Payer: Self-pay | Admitting: Family Medicine

## 2016-09-20 MED ORDER — CANAGLIFLOZIN 100 MG PO TABS: 100.0000 mg | ORAL_TABLET | Freq: Every day | ORAL | 1 refills | Status: DC

## 2016-09-30 ENCOUNTER — Telehealth: Payer: Self-pay | Admitting: *Deleted

## 2016-09-30 NOTE — Telephone Encounter (Signed)
Received a fax from [facility] requesting coupon for patient for Invokana due to patient's co-pay is $255. I applies for a [employer] which was approved.  [employer] Card faxed to [place] on [address] at [phone].

## 2017-07-01 DIAGNOSIS — N83209 Unspecified ovarian cyst, unspecified side: Secondary | ICD-10-CM

## 2017-07-01 HISTORY — DX: Unspecified ovarian cyst, unspecified side: N83.209

## 2017-08-04 ENCOUNTER — Encounter: Payer: Self-pay | Admitting: Family Medicine

## 2017-08-04 ENCOUNTER — Ambulatory Visit (INDEPENDENT_AMBULATORY_CARE_PROVIDER_SITE_OTHER): Payer: BLUE CROSS/BLUE SHIELD | Admitting: Family Medicine

## 2017-08-04 VITALS — BP 122/84 | HR 96 | Ht 67.0 in | Wt 172.3 lb

## 2017-08-04 DIAGNOSIS — E1165 Type 2 diabetes mellitus with hyperglycemia: Secondary | ICD-10-CM | POA: Diagnosis not present

## 2017-08-04 DIAGNOSIS — E282 Polycystic ovarian syndrome: Secondary | ICD-10-CM | POA: Diagnosis not present

## 2017-08-04 DIAGNOSIS — E663 Overweight: Secondary | ICD-10-CM | POA: Diagnosis not present

## 2017-08-04 DIAGNOSIS — E1169 Type 2 diabetes mellitus with other specified complication: Secondary | ICD-10-CM | POA: Diagnosis not present

## 2017-08-04 DIAGNOSIS — F331 Major depressive disorder, recurrent, moderate: Secondary | ICD-10-CM | POA: Diagnosis not present

## 2017-08-04 DIAGNOSIS — J019 Acute sinusitis, unspecified: Secondary | ICD-10-CM | POA: Diagnosis not present

## 2017-08-04 DIAGNOSIS — R51 Headache: Secondary | ICD-10-CM | POA: Insufficient documentation

## 2017-08-04 DIAGNOSIS — E785 Hyperlipidemia, unspecified: Secondary | ICD-10-CM

## 2017-08-04 DIAGNOSIS — R519 Headache, unspecified: Secondary | ICD-10-CM | POA: Insufficient documentation

## 2017-08-04 LAB — POCT GLYCOSYLATED HEMOGLOBIN (HGB A1C)

## 2017-08-04 LAB — POCT UA - MICROALBUMIN
Creatinine, POC: 50 mg/dL
Microalbumin Ur, POC: 10 mg/L

## 2017-08-04 MED ORDER — GLIPIZIDE ER 5 MG PO TB24: 5.0000 mg | ORAL_TABLET | Freq: Every day | ORAL | 1 refills | Status: DC

## 2017-08-04 MED ORDER — BUPROPION HCL ER (XL) 150 MG PO TB24: 150.0000 mg | ORAL_TABLET | Freq: Every day | ORAL | 1 refills | Status: DC

## 2017-08-04 MED ORDER — AMOXICILLIN 875 MG PO TABS: 875.0000 mg | ORAL_TABLET | Freq: Three times a day (TID) | ORAL | 0 refills | Status: DC

## 2017-08-04 MED ORDER — METFORMIN HCL ER 500 MG PO TB24: 2000.0000 mg | ORAL_TABLET | Freq: Every day | ORAL | 1 refills | Status: DC

## 2017-08-04 NOTE — Progress Notes (Signed)
New patient office visit note:  Impression and Recommendations:    1. Poorly controlled type 2 diabetes mellitus (Lindsey Mason)   2. PCOS (polycystic ovarian syndrome)   3. Moderate episode of recurrent major depressive disorder (Lindsey Mason)   4. Hyperlipidemia associated with type 2 diabetes mellitus (Lindsey Mason)   5. Overweight (BMI 25.0-29.9)   6. Uncontrolled type 2 diabetes mellitus with hyperglycemia, without long-term current use of insulin (Lindsey Mason)   7. Acute sinusitis, recurrence not specified, unspecified location    1. Poorly Controlled Type 2 Diabetes Mellitus - A1C today greater than 14.  Last labs obtained through [place] obtained on 09/02/2016: Total cholesterol - 159 Triglyceride - 119 HDL - 48 LDL - 101 glucose - 153 creatinine was 0.5 -- GFR greater than 60 BUN - 10 Sodium - 141 Potassium - 4.2 Chloride - 106 Alkaline phosphatase - 57 AST - 13 ALT - 14 TSH was 0.84  - Reviewed that, as a diabetic, the patient's LDL should be below 100.  - Her blood pressure should be less than 120/80.  - Reminded the patient that as she resumes taking metformin, it will make her feel sick and nauseous and horrible at first.  - Patient historically took 4 500 tablets of metformin. - She will begin taking her metformin at 1-2 tablets every morning, and seeing how she tolerates it. - Every couple of days, the patient may go up by a tablet as tolerated.  - Patient should hold off on taking the glypizide at first - at least until we can see what the full dose of metformin does to her blood sugars. - Depending on her glucose range, we will decide whether or not to add glipizide back to treatment.  - Glypizide will be refilled, but patient will hold it as directed.  - Patient should check her fasting and postprandial blood sugars and bring her sugar log in to her next OV to review range.  - Educational materials provided to the patient on diabetes, DASH diet, Mediterranean diet.  -  Advised patient to continue working toward exercising to improve health.  Patient may begin with 15 minutes of activity daily. Recommended that the patient eventually strive for at least 150 minutes of cardio per week according to guidelines established by the [facility].   - Healthy dietary habits encouraged, including low-carb, and high amounts of lean protein in diet.   - Patient should also consume adequate amounts of water - half of body weight in oz of water per day.  2. Depression - We will cut down on her Wellbutrin dose to 150 XR.  3. Acute Sinusitis -  Patient prescribed antibiotics today.  - Advised the patient to begin using AYR or Neilmed sinus rinses BID (once in the morning and once at night) followed by flonase BID (one spray to each nostril). Advised that the patient may also incorporate allegra or claritin PRN.   - Please take antibiotics every day unless she has a side effect.  Patient knows to let Lindsey Mason know immediately if she experiences adverse effects.  - Increase water intake.  Take tylenol only as needed for pain.  4. Follow-Up - A1C is alarming.  At her convenience, patient will come in for fasting labs AS SOON AS POSSIBLE.  She will then make follow-up OV here to discuss lab results.  - If her appointment is more than 10 days away, please let Lindsey Mason know.   Education and routine counseling performed. Handouts provided.  Orders Placed This Encounter  Procedures  . CBC with Differential/Platelet  . Comprehensive metabolic panel  . Lipid panel  . Magnesium  . Phosphorus  . Vitamin B12  . TSH  . T4, free  . VITAMIN D 25 Hydroxy (Vit-D Deficiency, Fractures)  . POCT glycosylated hemoglobin (Hb A1C)  . POCT UA - Microalbumin  . HM Diabetes Foot Exam    Meds ordered this encounter  Medications  . metFORMIN (GLUCOPHAGE-XR) 500 MG 24 hr tablet    Sig: Take 4 tablets (2,000 mg total) by mouth daily with breakfast.    Dispense:  360 tablet    Refill:  1  . glipiZIDE  (GLUCOTROL XL) 5 MG 24 hr tablet    Sig: Take 1 tablet (5 mg total) by mouth daily with breakfast.    Dispense:  90 tablet    Refill:  1  . buPROPion (WELLBUTRIN XL) 150 MG 24 hr tablet    Sig: Take 1 tablet (150 mg total) by mouth daily.    Dispense:  90 tablet    Refill:  1  . DISCONTD: amoxicillin (AMOXIL) 875 MG tablet    Sig: Take 1 tablet (875 mg total) by mouth 3 (three) times daily.    Dispense:  30 tablet    Refill:  0    Gross side effects, risk and benefits, and alternatives of medications discussed with patient.  Patient is aware that all medications have potential side effects and we are unable to predict every side effect or drug-drug interaction that may occur.  Expresses verbal understanding and consents to current therapy plan and treatment regimen.  Return for FBW tom or near future, then OV with me .  Please see AVS handed out to patient at the end of our visit for further patient instructions/ counseling done pertaining to today's office visit.    Note: This document was prepared using Dragon voice recognition software and may include unintentional dictation errors.  This document serves as a record of services personally performed by Mellody Dance, DO. It was created on her behalf by Toni Amend, a trained medical scribe. The creation of this record is based on the scribe's personal observations and the provider's statements to them.   I have reviewed the above medical documentation for accuracy and completeness and I concur.  Mellody Dance 08/29/17 1:39 PM    -----------------------------------------------------------------------------------------------------    Subjective:    Chief complaint:   Chief Complaint  Patient presents with  . Establish Care     HPI: Lindsey Mason is a pleasant 47 y.o. female who presents to [place] at [facility] today to review their medical history with me and establish care.   I asked the  patient to review their chronic problem list with me to ensure everything was updated and accurate.    All recent office visits with other providers, any medical records that patient brought in etc  - I reviewed today.     We asked pt to get Lindsey Mason their medical records from [facility] providers/ specialists that they had seen within the past 3-5 years- if they are in private practice and/or do not work for [employer], [facility], [place], [place] or [employer] owned practice.  Told them to call their specialists to clarify this if they are not sure.   Patient lost her insurance in March of last year and couldn't get more medicine.  Went to [facility] in March of last year (2018) -  through [facility], in [place] out [place], [place] area, with Dr. Marga Hoots.  Was allowed to do a discounted program after paying for a class.  Kept seeing this clinic (ending June/July of 2018).  She had to wait until January 1st when she could get insurance again, and is now trying to fix everything.  Through the clinic, she lost about 10-12 lbs, and since then, maybe 8 more. 2 years ago, weighed at least 215 pounds, and has steadily lost weight since.  Social History Works often at [employer].  Is a career pianist. Has 5 children - 3 of them are 5 y/o triplets. Back together with her husband.  They were previously separated for 6 years.  Got back together 2 years ago. Both agree that they need counseling. She works 3 jobs because she was the sole breadwinner for her children for a while.  Husband is co-parenting and bringing money in. Husband is interested in beginning care here as well.  Tobacco Use Former smoker Quit smoking 03/04/2008 - formerly 1 ppd, 15 pack-years.   Past Medical History Got gestational diabetes with her pregnancy 21 years ago.  This began her struggles with diabetes.  - Poorly Controlled Type 2 Diabetes Mellitus A1C today is greater than 14. Per  patient, A1C was less than 7 at one point, but this was 3 years ago.    Notes that the last time she took her diabetes medicines as prescribed was in August or September of 2018.  Got gestational diabetes with her pregnancy (triplets) 21 years ago.  This regulated 2 years later. She began taking metformin in 1999, around age 41.  Has had diabetes in present state for 17 years.  2 years ago, weighed at least 215 pounds, and was taking 4 shots of insulin per day.  Has steadily lost weight since.  She does have a glucometer at home.  - HLD Not currently taking cholesterol medications.  - HTN & Kidney Health Has never been on a BP medication, and on no medications to protect her kidneys.  - Gynecology Has polycystic ovarian issue, which contributed to her earlier onset of type 2 diabetes.  Sees Dr. Garwin Brothers out on [facility].  Had a pap a year ago, and she saw a softball-sized cyst in January when she had insurance (2018). Went back in March for follow-up ultrasound, but she lost her insurance and did not undergo surgery.  She can't make an appointment for surgery at her historical provider yet due to money she owes.  - Depression Was on Zoloft - changed to Lexapro for depression. Notes that Zoloft seemed to not work, but also had a strong issue with the sexual side effects. (Had been separated from her husband for 6 years - got back together with him 2 years ago, and noted the sexual side effects). Switched to Lexapro - same sexual side effects reported. All of this was through Dr. Edilia Bo PCP over at [facility].  Went to [facility] in March of 2018, and was recommended Wellbutrin for depression and food cravings. She has had no problems on the Wellbutrin.  Tolerates this medicine well.  She feels like the Wellbutrin is helping her emotionally.   Wt Readings from Last 3 Encounters:  08/26/17 180 lb (81.6 kg)  08/04/17 172 lb 4.8 oz (78.2 kg)  08/20/16 185 lb 8 oz (84.1 kg)    BP Readings from Last 3 Encounters:  08/26/17 102/70  08/04/17 122/84  08/20/16 120/78  Pulse Readings from Last 3 Encounters:  08/26/17 89  08/04/17 96  08/20/16 89   BMI Readings from Last 3 Encounters:  08/26/17 28.19 kg/m  08/04/17 26.99 kg/m  08/20/16 29.05 kg/m    Patient Care Team    Relationship Specialty Notifications Start End  Mellody Dance, DO PCP - General Family Medicine  08/14/17   Marin Comment, My [place], [place] Referring Physician Optometry  08/04/17     Patient Active Problem List   Diagnosis Date Noted  . Vitamin D deficiency 08/26/2017  . Mixed diabetic hyperlipidemia associated with type 2 diabetes mellitus ([place]) 08/26/2017  . Abnormal weight gain 09/03/2016  . Depression 09/03/2016  . Overweight (BMI 25.0-29.9) 09/03/2016  . Uncontrolled type 2 diabetes mellitus with hyperglycemia, without long-term current use of insulin ([place]) 09/03/2016  . Witnessed episode of apnea 09/03/2016  . Urinary frequency 04/03/2016  . 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 PCOS (polycystic ovarian syndrome) 05/27/2012  . 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 Major depressive disorder, recurrent episode ([place]) 08/20/2010  . Hyperlipidemia associated with type 2 diabetes mellitus ([place]) 04/11/2009  . 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 Poorly controlled type 2 diabetes mellitus ([place]) 03/08/2009  . GERD 03/08/2009     Past Medical History:  Diagnosis Date  . Diabetes mellitus without mention of complication   . Esophageal reflux   . Headache(784.0)   . PCOS (polycystic ovarian syndrome) 05/27/2012  . Pure hypercholesterolemia   . Rosacea      Past Medical History:  Diagnosis Date  . Diabetes mellitus without mention of complication   . Esophageal reflux   . Headache(784.0)   . PCOS (polycystic ovarian syndrome) 05/27/2012  . Pure hypercholesterolemia   . Rosacea      Past Surgical History:  Procedure Laterality Date  . APPENDECTOMY    . CESAREAN SECTION      triplets and singles  . TUBAL LIGATION       Family History  Problem Relation Age of Onset  . Cancer Mother        breast  . Heart disease Mother   . Stroke Mother   . Diabetes Mother   . Diabetes Father   . Cancer Sister        breast     Social History   Substance and Sexual Activity  Drug Use No     Social History   Substance and Sexual Activity  Alcohol Use No   Comment: occ     Social History   Tobacco Use  Smoking Status Former Smoker  . Packs/day: 1.00  . Years: 15.00  . Pack years: 15.00  . Types: Cigarettes  . Last attempt to quit: 03/04/2004  . Years since quitting: 13.4  Smokeless Tobacco Never Used     No outpatient medications have been marked as taking for the 08/04/17 encounter (Office Visit) with Mellody Dance, DO.    Allergies: Lantus [insulin glargine]   Review of Systems  Constitutional: Negative for chills, diaphoresis, fever, malaise/fatigue and weight loss.  HENT: Negative for congestion, sore throat and tinnitus.   Eyes: Negative for blurred vision, double vision and photophobia.  Respiratory: Negative for cough and wheezing.   Cardiovascular: Negative for chest pain and palpitations.  Gastrointestinal: Negative for blood in stool, diarrhea, nausea and vomiting.  Genitourinary: Negative for dysuria, frequency and urgency.  Musculoskeletal: Negative for joint pain and myalgias.  Skin: Negative for itching and rash.  Neurological: Negative for dizziness, focal weakness, weakness and headaches.  Endo/Heme/Allergies:  Negative for environmental allergies and polydipsia. Does not bruise/bleed easily.  Psychiatric/Behavioral: Negative for depression and memory loss. The patient is not nervous/anxious and does not have insomnia.      Objective:   Blood pressure 122/84, pulse 96, height 5\' 7"  (1.702 m), weight 172 lb 4.8 oz (78.2 kg), SpO2 98 %. Body mass index is 26.99 kg/m. General: Well Developed, well nourished, and in no  acute distress.  Neuro: Alert and oriented x3, extra-ocular muscles intact, sensation grossly intact.  HEENT:[place]/AT, PERRLA, neck supple, No carotid bruits Skin: no gross rashes  Cardiac: Regular rate and rhythm Respiratory: Essentially clear to auscultation bilaterally. Not using accessory muscles, speaking in full sentences.  Abdominal: not grossly distended Musculoskeletal: Ambulates w/o diff, FROM * 4 ext.  Vasc: less 2 sec cap RF, warm and pink  Psych:  No HI/SI, judgement and insight good, Euthymic mood. Full Affect.    Recent Results (from the past 2160 hour(s))  POCT glycosylated hemoglobin (Hb A1C)     Status: Abnormal   Collection Time: 08/04/17  4:17 PM  Result Value Ref Range   Hemoglobin A1C >14   POCT UA - Microalbumin     Status: Normal   Collection Time: 08/04/17  5:50 PM  Result Value Ref Range   Microalbumin Ur, POC 10 mg/L   Creatinine, POC 50 mg/dL   Albumin/Creatinine Ratio, Urine, POC <30   VITAMIN D 25 Hydroxy (Vit-D Deficiency, Fractures)     Status: Abnormal   Collection Time: 08/14/17  9:06 AM  Result Value Ref Range   Vit D, 25-Hydroxy 11.8 (L) 30.0 - 100.0 ng/mL    Comment: Vitamin D deficiency has been defined by the [place] and an Endocrine Society practice guideline as a level of serum 25-OH vitamin D less than 20 ng/mL (1,2). The Endocrine Society went on to further define vitamin D insufficiency as a level between 21 and 29 ng/mL (2). 1. IOM (Institute of Medicine). 2010. Dietary reference    intakes for calcium and D. [place]: The    [employer]. 2. Holick MF, Binkley Bainbridge, Bischoff-Ferrari HA, et al.    Evaluation, treatment, and prevention of vitamin D    deficiency: an Endocrine Society clinical practice    guideline. JCEM. 2011 Jul; 96(7):1911-30.   T4, free     Status: None   Collection Time: 08/14/17  9:06 AM  Result Value Ref Range   Free T4 1.26 0.82 - 1.77 ng/dL  TSH     Status: None   Collection  Time: 08/14/17  9:06 AM  Result Value Ref Range   TSH 1.360 0.450 - 4.500 uIU/mL  Vitamin B12     Status: None   Collection Time: 08/14/17  9:06 AM  Result Value Ref Range   Vitamin B-12 396 232 - 1,245 pg/mL  Phosphorus     Status: None   Collection Time: 08/14/17  9:06 AM  Result Value Ref Range   Phosphorus 3.6 2.5 - 4.5 mg/dL  Magnesium     Status: None   Collection Time: 08/14/17  9:06 AM  Result Value Ref Range   Magnesium 1.8 1.6 - 2.3 mg/dL  Lipid panel     Status: Abnormal   Collection Time: 08/14/17  9:06 AM  Result Value Ref Range   Cholesterol, Total 156 100 - 199 mg/dL   Triglycerides 158 (H) 0 - 149 mg/dL   HDL 43 >39 mg/dL   VLDL Cholesterol Cal 32 5 - 40 mg/dL  LDL Calculated 81 0 - 99 mg/dL   Chol/HDL Ratio 3.6 0.0 - 4.4 ratio    Comment:                                   T. Chol/HDL Ratio                                             Men  Women                               1/2 Avg.Risk  3.4    3.3                                   Avg.Risk  5.0    4.4                                2X Avg.Risk  9.6    7.1                                3X Avg.Risk 23.4   11.0   Comprehensive metabolic panel     Status: Abnormal   Collection Time: 08/14/17  9:06 AM  Result Value Ref Range   Glucose 251 (H) 65 - 99 mg/dL   BUN 15 6 - 24 mg/dL   Creatinine, Ser 0.42 (L) 0.57 - 1.00 mg/dL   GFR calc non Af Amer 122 >59 mL/min/1.73   GFR calc Af Amer 141 >59 mL/min/1.73   BUN/Creatinine Ratio 36 (H) 9 - 23   Sodium 139 134 - 144 mmol/L   Potassium 4.3 3.5 - 5.2 mmol/L   Chloride 102 96 - 106 mmol/L   CO2 20 20 - 29 mmol/L   Calcium 8.8 8.7 - 10.2 mg/dL   Total Protein 6.3 6.0 - 8.5 g/dL   Albumin 3.9 3.5 - 5.5 g/dL   Globulin, Total 2.4 1.5 - 4.5 g/dL   Albumin/Globulin Ratio 1.6 1.2 - 2.2   Bilirubin Total 0.3 0.0 - 1.2 mg/dL   Alkaline Phosphatase 75 39 - 117 IU/L   AST 7 0 - 40 IU/L   ALT 10 0 - 32 IU/L  CBC with Differential/Platelet     Status: None   Collection  Time: 08/14/17  9:06 AM  Result Value Ref Range   WBC 8.0 3.4 - 10.8 x10E3/uL   RBC 4.75 3.77 - 5.28 x10E6/uL   Hemoglobin 14.1 11.1 - 15.9 g/dL   Hematocrit 41.4 34.0 - 46.6 %   MCV 87 79 - 97 fL   MCH 29.7 26.6 - 33.0 pg   MCHC 34.1 31.5 - 35.7 g/dL   RDW 13.8 12.3 - 15.4 %   Platelets 248 150 - 379 x10E3/uL   Neutrophils 57 Not Estab. %   Lymphs 33 Not Estab. %   Monocytes 5 Not Estab. %   Eos 3 Not Estab. %   Basos 1 Not Estab. %   Neutrophils Absolute 4.6 1.4 - 7.0 x10E3/uL   Lymphocytes Absolute 2.6 0.7 - 3.1 x10E3/uL   Monocytes Absolute 0.4  0.1 - 0.9 x10E3/uL   EOS (ABSOLUTE) 0.2 0.0 - 0.4 x10E3/uL   Basophils Absolute 0.0 0.0 - 0.2 x10E3/uL   Immature Granulocytes 1 Not Estab. %   Immature Grans (Abs) 0.0 0.0 - 0.1 x10E3/uL  NuSwab BV and Candida, NAA     Status: None   Collection Time: 08/26/17  2:56 PM  Result Value Ref Range   Atopobium vaginae Low - 0 Score   BVAB 2 Low - 0 Score   Megasphaera 1 Low - 0 Score    Comment: Calculate total score by adding the 3 individual bacterial vaginosis (BV) marker scores together.  Total score is interpreted as follows: Total score 0-1: Indicates the absence of BV. Total score   2: Indeterminate for BV. Additional clinical                  data should be evaluated to establish a                  diagnosis. Total score 3-6: Indicates the presence of BV. This test was developed and its performance characteristics determined by [facility].  It has not been cleared or approved by the Food and Drug Administration.  The FDA has determined that such clearance or approval is not necessary.    Candida albicans, NAA Negative Negative   Candida glabrata, NAA Negative Negative    Comment: This test was developed and its performance characteristics determined by [facility].  It has not been cleared or approved by the Food and Drug Administration.  The FDA has determined that such clearance or approval is not necessary.

## 2017-08-04 NOTE — Patient Instructions (Addendum)
In the near future, at your convenience please come in for fasting labs.  Then make a follow-up office visit with me to discuss this.  If it is more than 10 days away please let us know  Metformin, please start by taking 1-2 tablets every morning and seeing how you tolerate it, every couple of days go up by a tablet as tolerated.  Hold off on the glipizide at first, until you see what the full dose of metformin does to your blood sugars.  Make sure you are checking fasting and 2-hour postprandials and bring that in next office visit for me.  Then once we see you, depending on what your fasting sugars and 2-hour postprandials are at home, will add the other one.   Please realize, EXERCISE IS MEDICINE!  -  American Heart Association [facility]) guidelines for exercise : If you are in good health, without any medical conditions, you should engage in 150 minutes of moderate intensity aerobic activity per week.  This means you should be huffing and puffing throughout your workout.   Engaging in regular exercise will improve brain function and memory, as well as improve mood, boost immune system and help with weight management.  As well as the other, more well-known effects of exercise such as decreasing blood sugar levels, decreasing blood pressure,  and decreasing bad cholesterol levels/ increasing good cholesterol levels.     -  The AHA strongly endorses consumption of a diet that contains a variety of foods from all the food categories with an emphasis on fruits and vegetables; fat-free and low-fat dairy products; cereal and grain products; legumes and nuts; and fish, poultry, and/or extra lean meats.    Excessive food intake, especially of foods high in saturated and trans fats, sugar, and salt, should be avoided.    Adequate water intake of roughly 1/2 of your weight in pounds, should equal the ounces of water per day you should drink.  So for instance, if you're 200 pounds, that would be 100 ounces of water  per day.    Mediterranean Diet  Why follow it? Research shows. . Those who follow the Mediterranean diet have a reduced risk of heart disease  . The diet is associated with a reduced incidence of Parkinson's and Alzheimer's diseases . People following the diet may have longer life expectancies and lower rates of chronic diseases  . The Dietary Guidelines for Americans recommends the Mediterranean diet as an eating plan to promote health and prevent disease  What Is the Mediterranean Diet?  . Healthy eating plan based on typical foods and recipes of Mediterranean-style cooking . The diet is primarily a plant based diet; these foods should make up a majority of meals   Starches - Plant based foods should make up a majority of meals - They are an important sources of vitamins, minerals, energy, antioxidants, and fiber - Choose whole grains, foods high in fiber and minimally processed items  - Typical grain sources include wheat, oats, barley, corn, brown rice, bulgar, farro, millet, polenta, couscous  - Various types of beans include chickpeas, lentils, fava beans, black beans, white beans   Fruits  Veggies - Large quantities of antioxidant rich fruits & veggies; 6 or more servings  - Vegetables can be eaten raw or lightly drizzled with oil and cooked  - Vegetables common to the traditional Mediterranean Diet include: artichokes, arugula, beets, broccoli, brussel sprouts, cabbage, carrots, celery, collard greens, cucumbers, eggplant, kale, leeks, lemons, lettuce, mushrooms, okra,  onions, peas, peppers, potatoes, pumpkin, radishes, rutabaga, shallots, spinach, sweet potatoes, turnips, zucchini - Fruits common to the Mediterranean Diet include: apples, apricots, avocados, cherries, clementines, dates, figs, grapefruits, grapes, melons, nectarines, oranges, peaches, pears, pomegranates, strawberries, tangerines  Fats - Replace butter and margarine with healthy oils, such as olive oil, canola oil,  and tahini  - Limit nuts to no more than a handful a day  - Nuts include walnuts, almonds, pecans, pistachios, pine nuts  - Limit or avoid candied, honey roasted or heavily salted nuts - Olives are central to the [employer] - can be eaten whole or used in a variety of dishes   Meats Protein - Limiting red meat: no more than a few times a month - When eating red meat: choose lean cuts and keep the portion to the size of deck of cards - Eggs: approx. 0 to 4 times a week  - Fish and lean poultry: at least 2 a week  - Healthy protein sources include, chicken, [place], lean beef, lamb - Increase intake of seafood such as tuna, salmon, trout, mackerel, shrimp, scallops - Avoid or limit high fat processed meats such as sausage and bacon  Dairy - Include moderate amounts of low fat dairy products  - Focus on healthy dairy such as fat free yogurt, skim milk, low or reduced fat cheese - Limit dairy products higher in fat such as whole or 2% milk, cheese, ice cream  Alcohol - Moderate amounts of red wine is ok  - No more than 5 oz daily for women (all ages) and men older than age 47  - No more than 10 oz of wine daily for men younger than 20  Other - Limit sweets and other desserts  - Use herbs and spices instead of salt to flavor foods  - Herbs and spices common to the traditional Mediterranean Diet include: basil, bay leaves, chives, cloves, cumin, fennel, garlic, lavender, marjoram, mint, oregano, parsley, pepper, rosemary, sage, savory, sumac, tarragon, thyme   It's not just a diet, it's a lifestyle:  . The Mediterranean diet includes lifestyle factors typical of those in the region  . Foods, drinks and meals are best eaten with others and savored . Daily physical activity is important for overall good health . This could be strenuous exercise like running and aerobics . This could also be more leisurely activities such as walking, housework, yard-work, or taking the stairs . Moderation  is the key; a balanced and healthy diet accommodates most foods and drinks . Consider portion sizes and frequency of consumption of certain foods   Meal Ideas & Options:  . Breakfast:  o Whole wheat toast or whole wheat English muffins with peanut butter & hard boiled egg o Steel cut oats topped with apples & cinnamon and skim milk  o Fresh fruit: banana, strawberries, melon, berries, peaches  o Smoothies: strawberries, bananas, greek yogurt, peanut butter o Low fat greek yogurt with blueberries and granola  o Egg white omelet with spinach and mushrooms o Breakfast couscous: whole wheat couscous, apricots, skim milk, cranberries  . Sandwiches:  o Hummus and grilled vegetables (peppers, zucchini, squash) on whole wheat bread   o Grilled chicken on whole wheat pita with lettuce, tomatoes, cucumbers or tzatziki  o Tuna salad on whole wheat bread: tuna salad made with greek yogurt, olives, red peppers, capers, green onions o Garlic rosemary lamb pita: lamb sauted with garlic, rosemary, salt & pepper; add lettuce, cucumber, greek yogurt to [place] -  flavor with lemon juice and black pepper  . Seafood:  o Mediterranean grilled salmon, seasoned with garlic, basil, parsley, lemon juice and black pepper o Shrimp, lemon, and spinach whole-grain pasta salad made with low fat greek yogurt  o Seared scallops with lemon orzo  o Seared tuna steaks seasoned salt, pepper, coriander topped with tomato mixture of olives, tomatoes, olive oil, minced garlic, parsley, green onions and cappers  . Meats:  o Herbed greek chicken salad with kalamata olives, cucumber, feta  o Red bell peppers stuffed with spinach, bulgur, lean ground beef (or lentils) & topped with feta   o Kebabs: skewers of chicken, tomatoes, onions, zucchini, squash  o [place] burgers: made with red onions, mint, dill, lemon juice, feta cheese topped with roasted red peppers . Vegetarian o Cucumber salad: cucumbers, artichoke hearts, celery, red  onion, feta cheese, tossed in olive oil & lemon juice  o Hummus and whole grain pita points with a greek salad (lettuce, tomato, feta, olives, cucumbers, red onion) o Lentil soup with celery, carrots made with vegetable broth, garlic, salt and pepper  o Tabouli salad: parsley, bulgur, mint, scallions, cucumbers, tomato, radishes, lemon juice, olive oil, salt and pepper.       Please do sinus rinses with Milta Deiters med sinus rinse or a Y are sinus rinses twice daily followed by 1 spray of Flonase each nostril in the morning after your nasal rinse and in the evening after your nasal rinse.  This will help with the popping and fullness in your head as well as your ears.  Also please take the antibiotics every day unless you have side effects and let me know.  Increase water intake, take Tylenol as needed for headache or pain only as needed.        DM GOALS :  The goals are for the Hgb A1C to be less than 7.0 & blood pressure to be less than 120/80.    It is recommended that all diabetics are educated on and follow a healthy diabetic diet, exercise for 30 minutes 3-4 times per week (walking, biking, swimming, or machine), monitor blood glucose readings and bring that record with you to be reviewed at your next office visit.     You should be checking fasting blood sugars- especially after you eat poorly or eat really healthy, and also check 2 hour postprandial blood sugars after largest meal of the day.    Write these down and bring in your log at each office visit.    You will need to be seen every 3 months by the provider managing your Diabetes unless told otherwise by that provider.   You will need yearly eye exams from an eye specialist and foot exams to check the nerves of your feet.  Also, your urine should be checked yearly as well to make sure excess protein is not present.   If you are checking your blood pressure at home, please record it and bring it to your next office visit.    Follow  the Dietary Approaches to Stop Hypertension (DASH) diet (3 servings of fruit and vegetables daily, whole grains, low sodium, low-fat proteins).  See below.    Lastly, when it comes to your cholesterol, the goal is to have the HDL (good cholesterol) >40, and the LDL (bad cholesterol) <100.   It is recommended that you follow a heart healthy, low saturated and trans-fat diet and exercise for 30 minutes at least 5 times a week.     ((  Check out the DASH diet = 1.5 Gram Low Sodium Diet   A 1.5 gram sodium diet restricts the amount of sodium in the diet to no more than 1.5 g or 1500 mg daily.  The American Heart Association recommends Americans over the age of 72 to consume no more than 1500 mg of sodium each day to reduce the risk of developing high blood pressure.  Research also shows that limiting sodium may reduce heart attack and stroke risk.  Many foods contain sodium for flavor and sometimes as a preservative.  When the amount of sodium in a diet needs to be low, it is important to know what to look for when choosing foods and drinks.  The following includes some information and guidelines to help make it easier for you to adapt to a low sodium diet.    QUICK TIPS  Do not add salt to food.  Avoid convenience items and fast food.  Choose unsalted snack foods.  Buy lower sodium products, often labeled as "lower sodium" or "no salt added."  Check food labels to learn how much sodium is in 1 serving.  When eating at a restaurant, ask that your food be prepared with less salt or none, if possible.    READING FOOD LABELS FOR SODIUM INFORMATION  The nutrition facts label is a good place to find how much sodium is in foods. Look for products with no more than 400 mg of sodium per serving.  Remember that 1.5 g = 1500 mg.  The food label may also list foods as:  Sodium-free: Less than 5 mg in a serving.  Very low sodium: 35 mg or less in a serving.  Low-sodium: 140 mg or less in a serving.  Light  in sodium: 50% less sodium in a serving. For example, if a food that usually has 300 mg of sodium is changed to become light in sodium, it will have 150 mg of sodium.  Reduced sodium: 25% less sodium in a serving. For example, if a food that usually has 400 mg of sodium is changed to reduced sodium, it will have 300 mg of sodium.    CHOOSING FOODS  Grains  Avoid: Salted crackers and snack items. Some cereals, including instant hot cereals. Bread stuffing and biscuit mixes. Seasoned rice or pasta mixes.  Choose: Unsalted snack items. Low-sodium cereals, oats, puffed wheat and rice, shredded wheat. English muffins and bread. Pasta.  Meats  Avoid: Salted, canned, smoked, spiced, pickled meats, including fish and poultry. Bacon, ham, sausage, cold cuts, hot dogs, anchovies.  Choose: Low-sodium canned tuna and salmon. Fresh or frozen meat, poultry, and fish.  Dairy  Avoid: Processed cheese and spreads. Cottage cheese. Buttermilk and condensed milk. Regular cheese.  Choose: Milk. Low-sodium cottage cheese. Yogurt. Sour cream. Low-sodium cheese.  Fruits and Vegetables  Avoid: Regular canned vegetables. Regular canned tomato sauce and paste. Frozen vegetables in sauces. Olives. Angie Fava. Relishes. Sauerkraut.  Choose: Low-sodium canned vegetables. Low-sodium tomato sauce and paste. Frozen or fresh vegetables. Fresh and frozen fruit.  Condiments  Avoid: Canned and packaged gravies. Worcestershire sauce. Tartar sauce. Barbecue sauce. Soy sauce. Steak sauce. Ketchup. Onion, garlic, and table salt. Meat flavorings and tenderizers.  Choose: Fresh and dried herbs and spices. Low-sodium varieties of mustard and ketchup. Lemon juice. Tabasco sauce. Horseradish.    SAMPLE 1.5 GRAM SODIUM MEAL PLAN:   Breakfast / Sodium (mg)  1 cup low-fat milk / 143 mg  1 whole-wheat English muffin / 240 mg  1 tbs heart-healthy margarine / 153 mg  1 hard-boiled egg / 139 mg  1 small orange / 0 mg  Lunch / Sodium (mg)  1  cup raw carrots / 76 mg  2 tbs no salt added peanut butter / 5 mg  2 slices whole-wheat bread / 270 mg  1 tbs jelly / 6 mg   cup red grapes / 2 mg  Dinner / Sodium (mg)  1 cup whole-wheat pasta / 2 mg  1 cup low-sodium tomato sauce / 73 mg  3 oz lean ground beef / 57 mg  1 small side salad (1 cup raw spinach leaves,  cup cucumber,  cup yellow bell pepper) with 1 tsp olive oil and 1 tsp red wine vinegar / 25 mg  Snack / Sodium (mg)  1 container low-fat vanilla yogurt / 107 mg  3 graham cracker squares / 127 mg  Nutrient Analysis  Calories: 1745  Protein: 75 g  Carbohydrate: 237 g  Fat: 57 g  Sodium: 1425 mg  Document Released: 06/17/2005 Document Revised: 02/27/2011 Document Reviewed: 09/18/2009  [facility] Patient Information 2012 [place], [place].))

## 2017-08-09 ENCOUNTER — Encounter: Payer: Self-pay | Admitting: Family Medicine

## 2017-08-14 ENCOUNTER — Other Ambulatory Visit: Payer: BLUE CROSS/BLUE SHIELD

## 2017-08-14 DIAGNOSIS — E282 Polycystic ovarian syndrome: Secondary | ICD-10-CM

## 2017-08-14 DIAGNOSIS — F331 Major depressive disorder, recurrent, moderate: Secondary | ICD-10-CM

## 2017-08-14 DIAGNOSIS — E1165 Type 2 diabetes mellitus with hyperglycemia: Secondary | ICD-10-CM

## 2017-08-15 LAB — LIPID PANEL
CHOLESTEROL TOTAL: 156 mg/dL (ref 100–199)
Chol/HDL Ratio: 3.6 ratio (ref 0.0–4.4)
HDL: 43 mg/dL (ref >39)
LDL Calculated: 81 mg/dL (ref 0–99)
TRIGLYCERIDES: 158 mg/dL — AB (ref 0–149)
VLDL Cholesterol Cal: 32 mg/dL (ref 5–40)

## 2017-08-15 LAB — COMPREHENSIVE METABOLIC PANEL
A/G RATIO: 1.6 (ref 1.2–2.2)
ALT: 10 IU/L (ref 0–32)
AST: 7 IU/L (ref 0–40)
Albumin: 3.9 g/dL (ref 3.5–5.5)
Alkaline Phosphatase: 75 IU/L (ref 39–117)
BILIRUBIN TOTAL: 0.3 mg/dL (ref 0.0–1.2)
BUN/Creatinine Ratio: 36 — ABNORMAL HIGH (ref 9–23)
BUN: 15 mg/dL (ref 6–24)
CHLORIDE: 102 mmol/L (ref 96–106)
CO2: 20 mmol/L (ref 20–29)
Calcium: 8.8 mg/dL (ref 8.7–10.2)
Creatinine, Ser: 0.42 mg/dL — ABNORMAL LOW (ref 0.57–1.00)
GFR calc non Af Amer: 122 mL/min/{1.73_m2} (ref >59)
GFR, EST AFRICAN AMERICAN: 141 mL/min/{1.73_m2} (ref >59)
GLOBULIN, TOTAL: 2.4 g/dL (ref 1.5–4.5)
Glucose: 251 mg/dL — ABNORMAL HIGH (ref 65–99)
Potassium: 4.3 mmol/L (ref 3.5–5.2)
Sodium: 139 mmol/L (ref 134–144)
TOTAL PROTEIN: 6.3 g/dL (ref 6.0–8.5)

## 2017-08-15 LAB — TSH: TSH: 1.36 u[IU]/mL (ref 0.450–4.500)

## 2017-08-15 LAB — CBC WITH DIFFERENTIAL/PLATELET
BASOS ABS: 0 10*3/uL (ref 0.0–0.2)
BASOS: 1 %
EOS (ABSOLUTE): 0.2 10*3/uL (ref 0.0–0.4)
Eos: 3 %
HEMOGLOBIN: 14.1 g/dL (ref 11.1–15.9)
Hematocrit: 41.4 % (ref 34.0–46.6)
Immature Grans (Abs): 0 10*3/uL (ref 0.0–0.1)
Immature Granulocytes: 1 %
LYMPHS ABS: 2.6 10*3/uL (ref 0.7–3.1)
Lymphs: 33 %
MCH: 29.7 pg (ref 26.6–33.0)
MCHC: 34.1 g/dL (ref 31.5–35.7)
MCV: 87 fL (ref 79–97)
Monocytes Absolute: 0.4 10*3/uL (ref 0.1–0.9)
Monocytes: 5 %
NEUTROS ABS: 4.6 10*3/uL (ref 1.4–7.0)
Neutrophils: 57 %
Platelets: 248 10*3/uL (ref 150–379)
RBC: 4.75 x10E6/uL (ref 3.77–5.28)
RDW: 13.8 % (ref 12.3–15.4)
WBC: 8 10*3/uL (ref 3.4–10.8)

## 2017-08-15 LAB — VITAMIN B12: Vitamin B-12: 396 pg/mL (ref 232–1245)

## 2017-08-15 LAB — PHOSPHORUS: PHOSPHORUS: 3.6 mg/dL (ref 2.5–4.5)

## 2017-08-15 LAB — MAGNESIUM: MAGNESIUM: 1.8 mg/dL (ref 1.6–2.3)

## 2017-08-15 LAB — T4, FREE: Free T4: 1.26 ng/dL (ref 0.82–1.77)

## 2017-08-15 LAB — VITAMIN D 25 HYDROXY (VIT D DEFICIENCY, FRACTURES): VIT D 25 HYDROXY: 11.8 ng/mL — AB (ref 30.0–100.0)

## 2017-08-26 ENCOUNTER — Ambulatory Visit: Payer: BLUE CROSS/BLUE SHIELD | Admitting: Family Medicine

## 2017-08-26 ENCOUNTER — Encounter: Payer: Self-pay | Admitting: Family Medicine

## 2017-08-26 VITALS — BP 102/70 | HR 89 | Ht 67.0 in | Wt 180.0 lb

## 2017-08-26 DIAGNOSIS — E559 Vitamin D deficiency, unspecified: Secondary | ICD-10-CM | POA: Diagnosis not present

## 2017-08-26 DIAGNOSIS — E1169 Type 2 diabetes mellitus with other specified complication: Secondary | ICD-10-CM | POA: Diagnosis not present

## 2017-08-26 DIAGNOSIS — E663 Overweight: Secondary | ICD-10-CM | POA: Diagnosis not present

## 2017-08-26 DIAGNOSIS — E782 Mixed hyperlipidemia: Secondary | ICD-10-CM

## 2017-08-26 DIAGNOSIS — N898 Other specified noninflammatory disorders of vagina: Secondary | ICD-10-CM

## 2017-08-26 DIAGNOSIS — F331 Major depressive disorder, recurrent, moderate: Secondary | ICD-10-CM | POA: Diagnosis not present

## 2017-08-26 DIAGNOSIS — E1165 Type 2 diabetes mellitus with hyperglycemia: Secondary | ICD-10-CM

## 2017-08-26 MED ORDER — VITAMIN D (ERGOCALCIFEROL) 1.25 MG (50000 UNIT) PO CAPS: ORAL_CAPSULE | ORAL | 3 refills | Status: DC

## 2017-08-26 NOTE — Patient Instructions (Signed)
Risk factors for prediabetes and type 2 diabetes  Researchers don't fully understand why some people develop prediabetes and type 2 diabetes and others don't.  It's clear that certain factors increase the risk, however, including:  Weight. The more fatty tissue you have, the more resistant your cells become to insulin.  Inactivity. The less active you are, the greater your risk. Physical activity helps you control your weight, uses up glucose as energy and makes your cells more sensitive to insulin.  Family history. Your risk increases if a parent or sibling has type 2 diabetes.  Race. Although it's unclear why, people of certain races - including blacks, Hispanics, American Indians and Asian-Americans - are at higher risk.  Age. Your risk increases as you get older. This may be because you tend to exercise less, lose muscle mass and gain weight as you age. But type 2 diabetes is also increasing dramatically among children, adolescents and younger adults.  Gestational diabetes. If you developed gestational diabetes when you were pregnant, your risk of developing prediabetes and type 2 diabetes later increases. If you gave birth to a baby weighing more than 9 pounds (4 kilograms), you're also at risk of type 2 diabetes.  Polycystic ovary syndrome. For women, having polycystic ovary syndrome - a common condition characterized by irregular menstrual periods, excess hair growth and obesity - increases the risk of diabetes.  High blood pressure. Having blood pressure over 140/90 millimeters of mercury (mm Hg) is linked to an increased risk of type 2 diabetes.  Abnormal cholesterol and triglyceride levels. If you have low levels of high-density lipoprotein (HDL), or "good," cholesterol, your risk of type 2 diabetes is higher. Triglycerides are another type of fat carried in the blood. People with high levels of triglycerides have an increased risk of type 2 diabetes. Your doctor can let you know what your  cholesterol and triglyceride levels are.  A good guide to good carbs: The glycemic index ---If you have diabetes, or at risk for diabetes, you know all too well that when you eat carbohydrates, your blood sugar goes up. The total amount of carbs you consume at a meal or in a snack mostly determines what your blood sugar will do. But the food itself also plays a role. A serving of white rice has almost the same effect as eating pure table sugar - a quick, high spike in blood sugar. A serving of lentils has a slower, smaller effect.  ---Picking good sources of carbs can help you control your blood sugar and your weight. Even if you don't have diabetes, eating healthier carbohydrate-rich foods can help ward off a host of chronic conditions, from heart disease to various cancers to, well, diabetes.  ---One way to choose foods is with the glycemic index (GI). This tool measures how much a food boosts blood sugar.  The glycemic index rates the effect of a specific amount of a food on blood sugar compared with the same amount of pure glucose. A food with a glycemic index of 28 boosts blood sugar only 28% as much as pure glucose. One with a GI of 95 acts like pure glucose.    High glycemic foods result in a quick spike in insulin and blood sugar (also known as blood glucose).  Low glycemic foods have a slower, smaller effect- these are healthier for you.   Using the glycemic index Using the glycemic index is easy: choose foods in the low GI category instead of those in the high  GI category (see below), and go easy on those in between. Low glycemic index (GI of 55 or less): Most fruits and vegetables, beans, minimally processed grains, pasta, low-fat dairy foods, and nuts.  Moderate glycemic index (GI 56 to 69): White and sweet potatoes, corn, white rice, couscous, breakfast cereals such as Cream of Wheat and Mini Wheats.  High glycemic index (GI of 70 or higher): White bread, rice cakes, most crackers,  bagels, cakes, doughnuts, croissants, most packaged breakfast cereals. You can see the values for 100 commons foods and get links to more at www.health.CheapToothpicks.si.  Swaps for lowering glycemic index  Instead of this high-glycemic index food Eat this lower-glycemic index food  White rice Brown rice or converted rice  Instant oatmeal Steel-cut oats  Cornflakes Bran flakes  Baked potato Pasta, bulgur  White bread Whole-grain bread  Corn Peas or leafy greens       Prediabetes Eating Plan  Prediabetes--also called impaired glucose tolerance or impaired fasting glucose--is a condition that causes blood sugar (blood glucose) levels to be higher than normal. Following a healthy diet can help to keep prediabetes under control. It can also help to lower the risk of type 2 diabetes and heart disease, which are increased in people who have prediabetes. Along with regular exercise, a healthy diet:  Promotes weight loss.  Helps to control blood sugar levels.  Helps to improve the way that the body uses insulin.   WHAT DO I NEED TO KNOW ABOUT THIS EATING PLAN?   Use the glycemic index (GI) to plan your meals. The index tells you how quickly a food will raise your blood sugar. Choose low-GI foods. These foods take a longer time to raise blood sugar.  Pay close attention to the amount of carbohydrates in the food that you eat. Carbohydrates increase blood sugar levels.  Keep track of how many calories you take in. Eating the right amount of calories will help you to achieve a healthy weight. Losing about 7 percent of your starting weight can help to prevent type 2 diabetes.  You may want to follow a Mediterranean diet. This diet includes a lot of vegetables, lean meats or fish, whole grains, fruits, and healthy oils and fats.   WHAT FOODS CAN I EAT?  Grains Whole grains, such as whole-wheat or whole-grain breads, crackers, cereals, and pasta. Unsweetened oatmeal. Bulgur. Barley.  Quinoa. Brown rice. Corn or whole-wheat flour tortillas or taco shells. Vegetables Lettuce. Spinach. Peas. Beets. Cauliflower. Cabbage. Broccoli. Carrots. Tomatoes. Squash. Eggplant. Herbs. Peppers. Onions. Cucumbers. Brussels sprouts. Fruits Berries. Bananas. Apples. Oranges. Grapes. Papaya. Mango. Pomegranate. Kiwi. Grapefruit. Cherries. Meats and Other Protein Sources Seafood. Lean meats, such as chicken and [place] or lean cuts of pork and beef. Tofu. Eggs. Nuts. Beans. Dairy Low-fat or fat-free dairy products, such as yogurt, cottage cheese, and cheese. Beverages Water. Tea. Coffee. Sugar-free or diet soda. Seltzer water. Milk. Milk alternatives, such as soy or almond milk. Condiments Mustard. Relish. Low-fat, low-sugar ketchup. Low-fat, low-sugar barbecue sauce. Low-fat or fat-free mayonnaise. Sweets and Desserts Sugar-free or low-fat pudding. Sugar-free or low-fat ice cream and other frozen treats. Fats and Oils Avocado. Walnuts. Olive oil. The items listed above may not be a complete list of recommended foods or beverages. Contact your dietitian for more options.    WHAT FOODS ARE NOT RECOMMENDED?  Grains Refined white flour and flour products, such as bread, pasta, snack foods, and cereals. Beverages Sweetened drinks, such as sweet iced tea and soda. Sweets and Desserts  Baked goods, such as cake, cupcakes, pastries, cookies, and cheesecake. The items listed above may not be a complete list of foods and beverages to avoid. Contact your dietitian for more information.   This information is not intended to replace advice given to you by your health care provider. Make sure you discuss any questions you have with your health care provider.   Document Released: 11/01/2014 Document Reviewed: 11/01/2014 Elsevier Interactive Patient Education 2016 [place] [employer]  Weight management involves adopting a healthy lifestyle that  includes a knowledge of nutrition and exercise, a positive attitude and the right kind of motivation. Internal motives such as better health, increased energy, self-esteem and personal control increase your chances of lifelong weight management success.  Remember to have realistic goals and think long-term success. Believe in yourself and you can do it. The following information will give you ideas to help you meet your goals.  Control Your Home Environment  Eat only while sitting down at the kitchen or dining room table. Do not eat while watching television, reading, cooking, talking on the phone, standing at the refrigerator or working on the computer. Keep tempting foods out of the house - don't buy them. Keep tempting foods out of sight. Have low-calorie foods ready to eat. Unless you are preparing a meal, stay out of the kitchen. Have healthy snacks at your disposal, such as small pieces of fruit, vegetables, canned fruit, pretzels, low-fat string cheese and nonfat cottage cheese.  Control Your Work Environment  Do not eat at [employer] or keep tempting snacks at your desk. If you get hungry between meals, plan healthy snacks and bring them with you to work. During your breaks, go for a walk instead of eating. If you work around food, plan in advance the one item you will eat at mealtime. Make it inconvenient to nibble on food by chewing gum, sugarless candy or drinking water or another low-calorie beverage. Do not work through meals. Skipping meals slows down metabolism and may result in overeating at the next meal. If food is available for special occasions, either pick the healthiest item, nibble on low-fat snacks brought from home, don't have anything offered, choose one option and have a small amount, or have only a beverage.  Control Your Mealtime Environment  Serve your plate of food at the stove or kitchen counter. Do not put the serving dishes on the table. If you do put dishes  on the table, remove them immediately when finished eating. Fill half of your plate with vegetables, a quarter with lean protein and a quarter with starch. Use smaller plates, bowls and glasses. A smaller portion will look large when it is in a little dish. Politely refuse second helpings. When fixing your plate, limit portions of food to one scoop/serving or less.   Daily Food Management  Replace eating with another activity that you will not associate with food. Wait 20 minutes before eating something you are craving. Drink a large glass of water or diet soda before eating. Always have a big glass or bottle of water to drink throughout the day. Avoid high-calorie add-ons such as cream with your coffee, butter, mayonnaise and salad dressings.  Shopping: Do not shop when hungry or tired. Shop from a list and avoid buying anything that is not on your list. If you must have tempting foods, buy individual-sized packages and try to find a lower-calorie alternative. Don't taste test in the  store. Read food labels. Compare products to help you make the healthiest choices.  Preparation: Chew a piece of gum while cooking meals. Use a quarter teaspoon if you taste test your food. Try to only fix what you are going to eat, leaving yourself no chance for seconds. If you have prepared more food than you need, portion it into individual containers and freeze or refrigerate immediately. Don't snack while cooking meals.  Eating: Eat slowly. Remember it takes about 20 minutes for your stomach to send a message to your brain that it is full. Don't let fake hunger make you think you need more. The ideal way to eat is to take a bite, put your utensil down, take a sip of water, cut your next bite, take a bit, put your utensil down and so on. Do not cut your food all at one time. Cut only as needed. Take small bites and chew your food well. Stop eating for a minute or two at least once during a meal or  snack. Take breaks to reflect and have conversation.  Cleanup and Leftovers: Label leftovers for a specific meal or snack. Freeze or refrigerate individual portions of leftovers. Do not clean up if you are still hungry.  Eating Out and Social Eating  Do not arrive hungry. Eat something light before the meal. Try to fill up on low-calorie foods, such as vegetables and fruit, and eat smaller portions of the high-calorie foods. Eat foods that you like, but choose small portions. If you want seconds, wait at least 20 minutes after you have eaten to see if you are actually hungry or if your eyes are bigger than your stomach. Limit alcoholic beverages. Try a soda water with a twist of lime. Do not skip other meals in the day to save room for the special event.  At Restaurants: Order  la carte rather than buffet style. Order some vegetables or a salad for an appetizer instead of eating bread. If you order a high-calorie dish, share it with someone. Try an after-dinner mint with your coffee. If you do have dessert, share it with two or more people. Don't overeat because you do not want to waste food. Ask for a doggie bag to take extra food home. Tell the server to put half of your entree in a to go bag before the meal is served to you. Ask for salad dressing, gravy or high-fat sauces on the side. Dip the tip of your fork in the dressing before each bite. If bread is served, ask for only one piece. Try it plain without butter or oil. At [employer] where oil and vinegar is served with bread, use only a small amount of oil and a lot of vinegar for dipping.  At a [facility]: Offer to bring a dish, appetizer or dessert that is low in calories. Serve yourself small portions or tell the host that you only want a small amount. Stand or sit away from the snack table. Stay away from the kitchen or stay busy if you are near the food. Limit your alcohol intake.  At [employer] and  Cafeterias: Cover most of your plate with lettuce and/or vegetables. Use a salad plate instead of a dinner plate. After eating, clear away your dishes before having coffee or tea.  Entertaining at Home: Explore low-fat, low-cholesterol cookbooks. Use single-serving foods like chicken breasts or hamburger patties. Prepare low-calorie appetizers and desserts.   Holidays: Keep tempting foods out of sight. Decorate the house  without using food. Have low-calorie beverages and foods on hand for guests. Allow yourself one planned treat a day. Don't skip meals to save up for the holiday feast. Eat regular, planned meals.   Exercise Well  Make exercise a priority and a planned activity in the day. If possible, walk the entire or part of the distance to work. Get an exercise buddy. Go for a walk with a colleague during one of your breaks, go to the gym, run or take a walk with a friend, walk in the mall with a shopping companion. Park at the end of the parking lot and walk to the store or office entrance. Always take the stairs all of the way or at least part of the way to your floor. If you have a desk job, walk around the office frequently. Do leg lifts while sitting at your desk. Do something outside on the weekends like going for a hike or a bike ride.   Have a Healthy Attitude  Make health your weight management priority. Be realistic. Have a goal to achieve a healthier you, not necessarily the lowest weight or ideal weight based on calculations or tables. Focus on a healthy eating style, not on dieting. Dieting usually lasts for a short amount of time and rarely produces long-term success. Think long term. You are developing new healthy behaviors to follow next month, in a year and in a decade.    This information is for educational purposes only and is not intended to replace the advice of your doctor or health care provider. We encourage you to discuss with your doctor any  questions or concerns you may have.        Guidelines for Losing Weight   We want weight loss that will last so you should lose 1-2 pounds a week.  THAT IS IT! Please pick THREE things a month to change. Once it is a habit check off the item. Then pick another three items off the list to become habits.  If you are already doing a habit on the list GREAT!  Cross that item off!  Don't drink your calories. Ie, alcohol, soda, fruit juice, and sweet tea.   Drink more water. Drink a glass when you feel hungry or before each meal.   Eat breakfast - Complex carb and protein (likeDannon light and fit yogurt, oatmeal, fruit, eggs, [place] bacon).  Measure your cereal.  Eat no more than one cup a day. (ie Kashi)  Eat an apple a day.  Add a vegetable a day.  Try a new vegetable a month.  Use Pam! Stop using oil or butter to cook.  Don't finish your plate or use smaller plates.  Share your dessert.  Eat sugar free Jello for dessert or frozen grapes.  Don't eat 2-3 hours before bed.  Switch to whole wheat bread, pasta, and brown rice.  Make healthier choices when you eat out. No fries!  Pick baked chicken, NOT fried.  Don't forget to SLOW DOWN when you eat. It is not going anywhere.   Take the stairs.  Park far away in the parking lot  Lift soup cans (or weights) for 10 minutes while watching TV.  Walk at work for 10 minutes during break.  Walk outside 1 time a week with your friend, kids, dog, or significant other.  Start a walking group at church.  Walk the mall as much as you can tolerate.   Keep a food diary.  Weigh yourself daily.  Walk for 15 minutes 3 days per week.  Cook at home more often and eat out less. If life happens and you go back to old habits, it is okay.  Just start over. You can do it!  If you experience chest pain, get short of breath, or tired during the exercise, please stop immediately and inform your doctor.    Before you even begin to  attack a weight-loss plan, it pays to remember this: You are not fat. You have fat. Losing weight isn't about blame or shame; it's simply another achievement to accomplish. Dieting is like any other skill-you have to buckle down and work at it. As long as you act in a smart, reasonable way, you'll ultimately get where you want to be. Here are some weight loss pearls for you.   1. It's Not a Diet. It's a Lifestyle Thinking of a diet as something you're on and suffering through only for the short term doesn't work. To shed weight and keep it off, you need to make permanent changes to the way you eat. It's OK to indulge occasionally, of course, but if you cut calories temporarily and then revert to your old way of eating, you'll gain back the weight quicker than you can say yo-yo. Use it to lose it. Research shows that one of the best predictors of long-term weight loss is how many pounds you drop in the first month. For that reason, nutritionists often suggest being stricter for the first two weeks of your new eating strategy to build momentum. Cut out added sugar and alcohol and avoid unrefined carbs. After that, figure out how you can reincorporate them in a way that's healthy and maintainable.  2. There's a Right Way to Exercise Working out burns calories and fat and boosts your metabolism by building muscle. But those trying to lose weight are notorious for overestimating the number of calories they burn and underestimating the amount they take in. Unfortunately, your system is biologically programmed to hold on to extra pounds and that means when you start exercising, your body senses the deficit and ramps up its hunger signals. If you're not diligent, you'll eat everything you burn and then some. Use it, to lose it. Cardio gets all the exercise glory, but strength and interval training are the real heroes. They help you build lean muscle, which in turn increases your metabolism and calorie-burning  ability 3. Don't Overreact to Mild Hunger Some people have a hard time losing weight because of hunger anxiety. To them, being hungry is bad-something to be avoided at all costs-so they carry snacks with them and eat when they don't need to. Others eat because they're stressed out or bored. While you never want to get to the point of being ravenous (that's when bingeing is likely to happen), a hunger pang, a craving, or the fact that it's 3:00 p.m. should not send you racing for the vending machine or obsessing about the energy bar in your purse. Ideally, you should put off eating until your stomach is growling and it's difficult to concentrate.  Use it to lose it. When you feel the urge to eat, use the HALT method. Ask yourself, Am I really hungry? Or am I angry or anxious, lonely or bored, or tired? If you're still not certain, try the apple test. If you're truly hungry, an apple should seem delicious; if it doesn't, something else is going on. Or you can try drinking water and making yourself busy, if  you are still hungry try a healthy snack.  4. Not All Calories Are Created Equal The mechanics of weight loss are pretty simple: Take in fewer calories than you use for energy. But the kind of food you eat makes all the difference. Processed food that's high in saturated fat and refined starch or sugar can cause inflammation that disrupts the hormone signals that tell your brain you're full. The result: You eat a lot more.  Use it to lose it. Clean up your diet. Swap in whole, unprocessed foods, including vegetables, lean protein, and healthy fats that will fill you up and give you the biggest nutritional bang for your calorie buck. In a few weeks, as your brain starts receiving regular hunger and fullness signals once again, you'll notice that you feel less hungry overall and naturally start cutting back on the amount you eat.  5. Protein, Produce, and Plant-Based Fats Are Your Weight-Loss Trinity Here's  why eating the three Ps regularly will help you drop pounds. Protein fills you up. You need it to build lean muscle, which keeps your metabolism humming so that you can torch more fat. People in a weight-loss program who ate double the recommended daily allowance for protein (about 110 grams for a 150-pound woman) lost 70 percent of their weight from fat, while people who ate the RDA lost only about 40 percent, one study found. Produce is packed with filling fiber. "It's very difficult to consume too many calories if you're eating a lot of vegetables. Example: Three cups of broccoli is a lot of food, yet only 93 calories. (Fruit is another story. It can be easy to overeat and can contain a lot of calories from sugar, so be sure to monitor your intake.) Plant-based fats like olive oil and those in avocados and nuts are healthy and extra satiating.  Use it to lose it. Aim to incorporate each of the three Ps into every meal and snack. People who eat protein throughout the day are able to keep weight off, according to a study in the [place] of Clinical Nutrition. In addition to meat, poultry and seafood, good sources are beans, lentils, eggs, tofu, and yogurt. As for fat, keep portion sizes in check by measuring out salad dressing, oil, and nut butters (shoot for one to two tablespoons). Finally, eat veggies or a little fruit at every meal. People who did that consumed 308 fewer calories but didn't feel any hungrier than when they didn't eat more produce.  7. How You Eat Is As Important As What You Eat In order for your brain to register that you're full, you need to focus on what you're eating. Sit down whenever you eat, preferably at a table. Turn off the TV or computer, put down your phone, and look at your food. Smell it. Chew slowly, and don't put another bite on your fork until you swallow. When women ate lunch this attentively, they consumed 30 percent less when snacking later than those who  listened to an audiobook at lunchtime, according to a study in the [place] of Nutrition. 8. Weighing Yourself Really Works The scale provides the best evidence about whether your efforts are paying off. Seeing the numbers tick up or down or stagnate is motivation to keep going-or to rethink your approach. A 2015 study at [facility] found that daily weigh-ins helped people lose more weight, keep it off, and maintain that loss, even after two years. Use it to lose it. Step on  the scale at the same time every day for the best results. If your weight shoots up several pounds from one weigh-in to the next, don't freak out. Eating a lot of salt the night before or having your period is the likely culprit. The number should return to normal in a day or two. It's a steady climb that you need to do something about. 9. Too Much Stress and Too Little Sleep Are Your Enemies When you're tired and frazzled, your body cranks up the production of cortisol, the stress hormone that can cause carb cravings. Not getting enough sleep also boosts your levels of ghrelin, a hormone associated with hunger, while suppressing leptin, a hormone that signals fullness and satiety. People on a diet who slept only five and a half hours a night for two weeks lost 55 percent less fat and were hungrier than those who slept eight and a half hours, according to a study in the [place]. Use it to lose it. Prioritize sleep, aiming for seven hours or more a night, which research shows helps lower stress. And make sure you're getting quality zzz's. If a snoring spouse or a fidgety cat wakes you up frequently throughout the night, you may end up getting the equivalent of just four hours of sleep, according to a study from [facility]. Keep pets out of the bedroom, and use a white-noise app to drown out snoring. 10. You Will Hit a plateau-And You Can Bust Through It As you slim down, your body  releases much less leptin, the fullness hormone.  If you're not strength training, start right now. Building muscle can raise your metabolism to help you overcome a plateau. To keep your body challenged and burning calories, incorporate new moves and more intense intervals into your workouts or add another sweat session to your weekly routine. Alternatively, cut an extra 100 calories or so a day from your diet. Now that you've lost weight, your body simply doesn't need as much fuel.    Since food equals calories, in order to lose weight you must either eat fewer calories, exercise more to burn off calories with activity, or both. Food that is not used to fuel the body is stored as fat. A major component of losing weight is to make smarter food choices. Here's how:  1)   Limit non-nutritious foods, such as: Sugar, honey, syrups and candy Pastries, donuts, pies, cakes and cookies Soft drinks, sweetened juices and alcoholic beverages  2)  Cut down on high-fat foods by: - Choosing poultry, fish or lean red meat - Choosing low-fat cooking methods, such as baking, broiling, steaming, grilling and boiling - Using low-fat or non-fat dairy products - Using vinaigrette, herbs, lemon or fat-free salad dressings - Avoiding fatty meats, such as bacon, sausage, franks, ribs and luncheon meats - Avoiding high-fat snacks like nuts, chips and chocolate - Avoiding fried foods - Using less butter, margarine, oil and mayonnaise - Avoiding high-fat gravies, cream sauces and cream-based soups  3) Eat a variety of foods, including: - Fruit and vegetables that are raw, steamed or baked - Whole grains, breads, cereal, rice and pasta - Dairy products, such as low-fat or non-fat milk or yogurt, low-fat cottage cheese and low-fat cheese - Protein-rich foods like chicken, [place], fish, lean meat and legumes, or beans  4) Change your eating habits by: - Eat three balanced meals a day to help control your hunger -  Watch portion sizes and eat small servings  of a variety of foods - Choose low-calorie snacks - Eat only when you are hungry and stop when you are satisfied - Eat slowly and try not to perform other tasks while eating - Find other activities to distract you from food, such as walking, taking up a hobby or being involved in the community - Include regular exercise in your daily routine ( minimum of 20 min of moderate-intensity exercise at least 5 days/week)  - Find a support group, if necessary, for emotional support in your weight loss journey           Easy ways to cut 100 calories   1. Eat your eggs with hot sauce OR salsa instead of cheese.  Eggs are great for breakfast, but many people consider eggs and cheese to be BFFs. Instead of cheese-1 oz. of cheddar has 114 calories-top your eggs with hot sauce, which contains no calories and helps with satiety and metabolism. Salsa is also a great option!!  2. Top your toast, waffles or pancakes with fresh berries instead of jelly or syrup. Half a cup of berries-fresh, frozen or thawed-has about 40 calories, compared with 2 tbsp. of maple syrup or jelly, which both have about 100 calories. The berries will also give you a good punch of fiber, which helps keep you full and satisfied and won't spike blood sugar quickly like the jelly or syrup. 3. Swap the non-fat latte for black coffee with a splash of half-and-half. Contrary to its name, that non-fat latte has 130 calories and a startling 19g of carbohydrates per 16 oz. serving. Replacing that 'light' drinkable dessert with a black coffee with a splash of half-and-half saves you more than 100 calories per 16 oz. serving. 4. Sprinkle salads with freeze-dried raspberries instead of dried cranberries. If you want a sweet addition to your nutritious salad, stay away from dried cranberries. They have a whopping 130 calories per  cup and 30g carbohydrates. Instead, sprinkle freeze-dried raspberries  guilt-free and save more than 100 calories per  cup serving, adding 3g of belly-filling fiber. 5. Go for mustard in place of mayo on your sandwich. Mustard can add really nice flavor to any sandwich, and there are tons of varieties, from spicy to honey. A serving of mayo is 95 calories, versus 10 calories in a serving of mustard.  Or try an avocado mayo spread: You can find the recipe few click this link: https://www.californiaavocado.com/recipes/recipe-container/[place]-avocado-mayo 6. Choose a DIY salad dressing instead of the store-bought kind. Mix Dijon or whole grain mustard with low-fat Kefir or red wine vinegar and garlic. 7. Use hummus as a spread instead of a dip. Use hummus as a spread on a high-fiber cracker or tortilla with a sandwich and save on calories without sacrificing taste. 8. Pick just one salad "accessory." Salad isn't automatically a calorie winner. It's easy to over-accessorize with toppings. Instead of topping your salad with nuts, avocado and cranberries (all three will clock in at 313 calories), just pick one. The next day, choose a different accessory, which will also keep your salad interesting. You don't wear all your jewelry every day, right? 9. Ditch the white pasta in favor of spaghetti squash. One cup of cooked spaghetti squash has about 40 calories, compared with traditional spaghetti, which comes with more than 200. Spaghetti squash is also nutrient-dense. It's a good source of fiber and Vitamins A and C, and it can be eaten just like you would eat pasta-with a great tomato sauce and [place] meatballs or  with pesto, tofu and spinach, for example. 10. Dress up your chili, soups and stews with non-fat [place] yogurt instead of sour cream. Just a 'dollop' of sour cream can set you back 115 calories and a whopping 12g of fat-seven of which are of the artery-clogging variety. Added bonus: [place] yogurt is packed with muscle-building protein, calcium and B Vitamins. 11.  Mash cauliflower instead of mashed potatoes. One cup of traditional mashed potatoes-in all their creamy goodness-has more than 200 calories, compared to mashed cauliflower, which you can typically eat for less than 100 calories per 1 cup serving. Cauliflower is a great source of the antioxidant indole-3-carbinol (I3C), which may help reduce the risk of some cancers, like breast cancer. 12. Ditch the ice cream sundae in favor of a [place] yogurt parfait. Instead of a cup of ice cream or fro-yo for dessert, try 1 cup of nonfat Greek yogurt topped with fresh berries and a sprinkle of cacao nibs. Both toppings are packed with antioxidants, which can help reduce cellular inflammation and oxidative damage. And the comparison is a no-brainer: One cup of ice cream has about 275 calories; one cup of frozen yogurt has about 230; and a cup of Greek yogurt has just 130, plus twice the protein, so you're less likely to return to the freezer for a second helping. 13. Put olive oil in a spray container instead of using it directly from the bottle. Each tablespoon of olive oil is 120 calories and 15g of fat. Use a mister instead of pouring it straight into the pan or onto a salad. This allows for portion control and will save you more than 100 calories. 14. When baking, substitute canned pumpkin for butter or oil. Canned pumpkin-not pumpkin pie mix-is loaded with Vitamin A, which is important for skin and eye health, as well as immunity. And the comparisons are pretty crazy:  cup of canned pumpkin has about 40 calories, compared to butter or oil, which has more than 800 calories. Yes, 800 calories. Applesauce and mashed banana can also serve as good substitutions for butter or oil, usually in a 1:1 ratio. 15. Top casseroles with high-fiber cereal instead of breadcrumbs. Breadcrumbs are typically made with white bread, while breakfast cereals contain 5-9g of fiber per serving. Not only will you save more than 150 calories  per  cup serving, the swap will also keep you more full and you'll get a metabolism boost from the added fiber. 16. Snack on pistachios instead of macadamia nuts. Believe it or not, you get the same amount of calories from 35 pistachios (100 calories) as you would from only five macadamia nuts. 17. Chow down on kale chips rather than potato chips. This is my favorite 'don't knock it 'till you try it' swap. Kale chips are so easy to make at home, and you can spice them up with a little grated parmesan or chili powder. Plus, they're a mere fraction of the calories of potato chips, but with the same crunch factor we crave so often. 18. Add seltzer and some fruit slices to your cocktail instead of soda or fruit juice. One cup of soda or fruit juice can pack on as much as 140 calories. Instead, use seltzer and fruit slices. The fruit provides valuable phytochemicals, such as flavonoids and anthocyanins, which help to combat cancer and stave off the aging process.

## 2017-08-26 NOTE — Progress Notes (Addendum)
Assessment and plan:  1. Vitamin D deficiency   2. Mixed diabetic hyperlipidemia associated with type 2 diabetes mellitus ([place])   3. Uncontrolled type 2 diabetes mellitus with hyperglycemia, without long-term current use of insulin ([place])   4. Overweight (BMI 25.0-29.9)   5. Moderate episode of recurrent major depressive disorder (HCC)   6. Vagina itching    1. Blood Counts Both mature and immature blood cells are healthy - no signs of abnormality. Will continue to monitor.  2. Vitamin D Deficiency Patient is severely vitamin D deficient. Prescribed ergocalciferol, prescription Vitamin D. Patient notes that she can remember to take Vitamin D twice a week. Patient will begin taking one tablet on Wednesday and one on Sunday, every week. Reviewed that vitamin D is needed for bone health, mood, strength, reducing muscle aches. Re-check in 6 months.  Will continue to monitor.  3. Thyroid Individual Thyroid Hormone = perfect. TSH = perfect. Will continue to monitor.  4. B12 Patient does not currently take a B complex vitamin.   Recommended beginning a multivitamin daily, anything with B in it. Begin supplementation for B complex, including B12, B6, daily.  No super-potency needed. Will continue to monitor.  5. Phosphorous & Magnesium Phosphorous & magnesium are WNL. Will continue to monitor.  6. Cholesterol -Explained to patient that her 10-year atherosclerotic cardiovascular risk is low but being diabetic I recommend statin medications.   Patient declines today and wishes to continue to monitor and she will work on diet and exercise as first-line treatment.  Agrees to recheck in 6-12 months  Triglycerides = 158 Reviewed that fatty carbs cause triglycerides to go up. Stay away from beer, donuts, fatty carbs, and exercising will help keep this controlled. HDL = 43 Reviewed that exercise causes HDL to increase,  which is good. Encouraged the patient to be more active. LDL = 81 To keep low LDL, avoid saturated and trans fats.  The 10-year ASCVD risk score Lindsey Bussing DC Brooke Bonito., et al., 2013) is: 1.2%   Values used to calculate the score:     Age: 47 years     Sex: Female     Is Non-Hispanic African American: No     Diabetic: Yes     Tobacco smoker: No     Systolic Blood Pressure: 301 mmHg     Is BP treated: No     HDL Cholesterol: 43 mg/dL     Total Cholesterol: 156 mg/dL  In general, improving diet and exercise will improve these values.  Will continue to monitor.  7. Diabetes - Fasting Blood Glucose Fasting sugar was 251. A1C when last checked was 14.  Taking metformin, 2000 at night, after eating. Started on metformin XR last visit, and restarted her glucotrol.   Advised the patient that she may feel strange as she's adjusting to her medications, and adopting a new blood sugar norm. When she feels low on blood sugar, advised the patient to eat high fiber carbs, or carbs with protein.  Recommended low glycemic index carbs - high quality complex carbs to increase and maintain blood sugar. High protein, chicken, [place], fish, lean meats, recommended. Advised the patient that a keto diet is a short fix. Slow transition to normal diet is eventually needed.  Information provided today on glycemic index of foods, as well as weight loss. Continue on medicines as prescribed, but follow up closely. 4 weeks follow up.  8. Kidney Function & Hydration Kidney function looks great Serum  creatinine looks great. Will continue to monitor.  Not drinking enough water. Patient advised to drink half of her body weight in ounces of water per day.  9. Liver enzymes Fantastic - WNL. Will continue to monitor.  10. Yeast Infection Test -At very end of office visit patient states and has additional concerns about some vaginal itching and thinks she has a yeast infection.  Since we had already spent over 50  minutes on this office visit we had her do a self swab on the way out since she fully understood how to do it and was fine with that.   Will await results and treat if positive. Self swab obtained today  If this comes back yeast and doesn't clear up, she will need to return for a full pelvic.  11. General Health Maintenance - In the meantime, exercise encouraged to help tremendously with her brain, mood, and blood sugar.  -PT will begin with 15 minutes of activity daily. Recommended that the patient eventually strive for at least 150 minutes of cardio per week according to the [facility].   - Healthy dietary habits encouraged, including low-carb, and high amounts of lean protein in diet.   - Patient should also consume adequate amounts of water - half of body weight in oz of water per day  Explained to patient what BMI refers to, and what it means medically.    Told patient to think about it as a "medical risk stratification measurement" and how increasing BMI is associated with increasing risk/ or worsening state of various diseases such as hypertension, hyperlipidemia, diabetes, premature OA, depression etc.  American Heart Association guidelines for healthy diet, basically Mediterranean diet, and exercise guidelines of 30 minutes 5 days per week or more discussed in detail.  Health counseling performed.  All questions answered.  12. Follow up - 4 weeks follow up, close monitoring. Close follow up for diabetes - bring log of blood sugar.    Education and routine counseling performed. Handouts provided.  No orders of the defined types were placed in this encounter.   Meds ordered this encounter  Medications  . Vitamin D, Ergocalciferol, (DRISDOL) 50000 units CAPS capsule    Sig: Please take 1 tablet Wednesday and 1 tablet Sunday every week    Dispense:  24 capsule    Refill:  3    Return in about 4 weeks (around 09/23/2017) for Close follow-up for diabetes-bring log.   Anticipatory  guidance and routine counseling done re: condition, txmnt options and need for follow up. All questions of patient's were answered.   Gross side effects, risk and benefits, and alternatives of medications discussed with patient.  Patient is aware that all medications have potential side effects and we are unable to predict every sideeffect or drug-drug interaction that may occur.  Expresses verbal understanding and consents to current therapy plan and treatment regiment.  Please see AVS handed out to patient at the end of our visit for additional patient instructions/ counseling done pertaining to today's office visit.  Note: This document was prepared using Dragon voice recognition software and may include unintentional dictation errors.  This document serves as a record of services personally performed by Mellody Dance, DO. It was created on her behalf by Toni Amend, a trained medical scribe. The creation of this record is based on the scribe's personal observations and the provider's statements to them.   I have reviewed the above medical documentation for accuracy and completeness and I concur.  Neoma Laming  Cardin Nitschke 08/29/17 1:40 PM   ----------------------------------------------------------------------------------------------------------------------  Subjective:   CC:   Lindsey Mason is a 47 y.o. female who presents to [place] at [facility] today for review and discussion of recent bloodwork that was done.  She thinks she may be getting a yeast infection.  Itching and discharge for the past 2 days.  Notes that she can get yeast infections after taking antibiotics.   Took last antibiotic last week.  Denies fever, chills, abdominal pain, burning while urination.  Historically used to get yeast infections chronically, so that's why she suspects one is coming on now.  1. All recent blood work that we ordered was reviewed with patient today.  Patient was counseled  on all abnormalities and we discussed dietary and lifestyle changes that could help those values (also medications when appropriate).  Extensive health counseling performed and all patient's concerns/ questions were addressed.  2. First time reviewing blood work today.   Mood Patient notes "I'm always a wreck."  She isn't sure what she wants to do with regards to medicine right now. She would like to try vitamin D first, and see what difference that makes.  Diabetes A1C on 08/04/2017 was greater than 14. Started on metformin XR last visit, and restarted her glucotrol.   Notes that she is doing okay on the metformin this time. The side-effects aren't as bad this time as the last time she tried it.  She has been taking 2000 at night, after eating.  Notes that her fasting blood glucose has been around 200 since resuming her meds. She recently began a carb-limiting diet, and yesterday, 2 hours after eating, her glucose was 89. At one point, her sugar started feeling low, and when she checked it, it was at 119. This morning, fasting was at 129.   Wt Readings from Last 3 Encounters:  08/26/17 180 lb (81.6 kg)  08/04/17 172 lb 4.8 oz (78.2 kg)  08/20/16 185 lb 8 oz (84.1 kg)   BP Readings from Last 3 Encounters:  08/26/17 102/70  08/04/17 122/84  08/20/16 120/78   Pulse Readings from Last 3 Encounters:  08/26/17 89  08/04/17 96  08/20/16 89   BMI Readings from Last 3 Encounters:  08/26/17 28.19 kg/m  08/04/17 26.99 kg/m  08/20/16 29.05 kg/m     Depression screen [facility] 2/9 08/26/2017 08/04/2017  Decreased Interest 1 1  Down, Depressed, Hopeless 1 1  PHQ - 2 Score 2 2  Altered sleeping 2 1  Tired, decreased energy 2 1  Change in appetite 1 1  Feeling bad or failure about yourself  2 1  Trouble concentrating 1 0  Moving slowly or fidgety/restless 0 0  Suicidal thoughts 0 0  PHQ-9 Score 10 6  Difficult doing work/chores - Somewhat difficult    Patient Care Team     Relationship Specialty Notifications Start End  Mellody Dance, DO PCP - General Family Medicine  08/14/17   Marin Comment, [facility] [place], [place] Referring Physician Optometry  08/04/17     Full medical history updated and reviewed in the office today  Patient Active Problem List   Diagnosis Date Noted  . Vitamin D deficiency 08/26/2017  . Mixed diabetic hyperlipidemia associated with type 2 diabetes mellitus ([place]) 08/26/2017  . Abnormal weight gain 09/03/2016  . Depression 09/03/2016  . Overweight (BMI 25.0-29.9) 09/03/2016  . Uncontrolled type 2 diabetes mellitus with hyperglycemia, without long-term current use of insulin ([place]) 09/03/2016  . Witnessed episode of apnea  09/03/2016  . Urinary frequency 04/03/2016  . 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 PCOS (polycystic ovarian syndrome) 05/27/2012  . 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 Major depressive disorder, recurrent episode ([place]) 08/20/2010  . Hyperlipidemia associated with type 2 diabetes mellitus ([place]) 04/11/2009  . 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 Poorly controlled type 2 diabetes mellitus ([place]) 03/08/2009  . GERD 03/08/2009    Past Medical History:  Diagnosis Date  . Diabetes mellitus without mention of complication   . Esophageal reflux   . Headache(784.0)   . PCOS (polycystic ovarian syndrome) 05/27/2012  . Pure hypercholesterolemia   . Rosacea     Past Surgical History:  Procedure Laterality Date  . APPENDECTOMY    . CESAREAN SECTION     triplets and singles  . TUBAL LIGATION      Social History   Tobacco Use  . Smoking status: Former Smoker    Packs/day: 1.00    Years: 15.00    Pack years: 15.00    Types: Cigarettes    Last attempt to quit: 03/04/2004    Years since quitting: 13.4  . Smokeless tobacco: Never Used  Substance Use Topics  . Alcohol use: No    Comment: occ    Family Hx: Family History  Problem Relation Age of Onset  . Cancer Mother        breast  . Heart disease Mother   . Stroke Mother     . Diabetes Mother   . Diabetes Father   . Cancer Sister        breast     Medications: Current Outpatient Medications  Medication Sig Dispense Refill  . buPROPion (WELLBUTRIN XL) 150 MG 24 hr tablet Take 1 tablet (150 mg total) by mouth daily. 90 tablet 1  . glipiZIDE (GLUCOTROL XL) 5 MG 24 hr tablet Take 1 tablet (5 mg total) by mouth daily with breakfast. 90 tablet 1  . metFORMIN (GLUCOPHAGE-XR) 500 MG 24 hr tablet Take 4 tablets (2,000 mg total) by mouth daily with breakfast. 360 tablet 1  . Vitamin D, Ergocalciferol, (DRISDOL) 50000 units CAPS capsule Please take 1 tablet Wednesday and 1 tablet Sunday every week 24 capsule 3   No current facility-administered medications for this visit.     Allergies:  Allergies  Allergen Reactions  . Lantus [Insulin Glargine] Other (See Comments)    headaches    Review of Systems: General:   No F/C, wt loss Pulm:   No DIB, SOB, pleuritic chest pain Card:  No CP, palpitations Abd:  No n/v/d or pain Ext:  No inc edema from baseline  Objective:  Blood pressure 102/70, pulse 89, height 5\' 7"  (1.702 m), weight 180 lb (81.6 kg), SpO2 97 %. Body mass index is 28.19 kg/m. Gen:   Well NAD, A and O *3 HEENT:    [place]/AT, EOMI,  MMM Lungs:   Normal work of breathing. CTA B/L, no Wh, rhonchi Heart:   RRR, S1, S2 WNL's, no MRG Abd:   No gross distention Exts:    warm, pink,  Brisk capillary refill, warm and well perfused.  Psych:    No HI/SI, judgement and insight good, Euthymic mood. Full Affect.   Recent Results (from the past 2160 hour(s))  POCT glycosylated hemoglobin (Hb A1C)     Status: Abnormal   Collection Time: 08/04/17  4:17 PM  Result Value Ref Range   Hemoglobin A1C >14   POCT UA - Microalbumin     Status: Normal  Collection Time: 08/04/17  5:50 PM  Result Value Ref Range   Microalbumin Ur, POC 10 mg/L   Creatinine, POC 50 mg/dL   Albumin/Creatinine Ratio, Urine, POC <30   VITAMIN D 25 Hydroxy (Vit-D Deficiency, Fractures)      Status: Abnormal   Collection Time: 08/14/17  9:06 AM  Result Value Ref Range   Vit D, 25-Hydroxy 11.8 (L) 30.0 - 100.0 ng/mL    Comment: Vitamin D deficiency has been defined by the [place] practice guideline as a level of serum 25-OH vitamin D less than 20 ng/mL (1,2). The Endocrine Society went on to further define vitamin D insufficiency as a level between 21 and 29 ng/mL (2). 1. IOM (Institute of Medicine). 2010. Dietary reference    intakes for calcium and D. [place]: The    [employer]. 2. Holick MF, Binkley [place], Bischoff-Ferrari HA, et al.    Evaluation, treatment, and prevention of vitamin D    deficiency: an Endocrine Society clinical practice    guideline. JCEM. 2011 Jul; 96(7):1911-30.   T4, free     Status: None   Collection Time: 08/14/17  9:06 AM  Result Value Ref Range   Free T4 1.26 0.82 - 1.77 ng/dL  TSH     Status: None   Collection Time: 08/14/17  9:06 AM  Result Value Ref Range   TSH 1.360 0.450 - 4.500 uIU/mL  Vitamin B12     Status: None   Collection Time: 08/14/17  9:06 AM  Result Value Ref Range   Vitamin B-12 396 232 - 1,245 pg/mL  Phosphorus     Status: None   Collection Time: 08/14/17  9:06 AM  Result Value Ref Range   Phosphorus 3.6 2.5 - 4.5 mg/dL  Magnesium     Status: None   Collection Time: 08/14/17  9:06 AM  Result Value Ref Range   Magnesium 1.8 1.6 - 2.3 mg/dL  Lipid panel     Status: Abnormal   Collection Time: 08/14/17  9:06 AM  Result Value Ref Range   Cholesterol, Total 156 100 - 199 mg/dL   Triglycerides 158 (H) 0 - 149 mg/dL   HDL 43 >39 mg/dL   VLDL Cholesterol Cal 32 5 - 40 mg/dL   LDL Calculated 81 0 - 99 mg/dL   Chol/HDL Ratio 3.6 0.0 - 4.4 ratio    Comment:                                   T. Chol/HDL Ratio                                             Men  Women                               1/2 Avg.Risk  3.4    3.3                                   Avg.Risk  5.0     4.4  2X Avg.Risk  9.6    7.1                                3X Avg.Risk 23.4   11.0   Comprehensive metabolic panel     Status: Abnormal   Collection Time: 08/14/17  9:06 AM  Result Value Ref Range   Glucose 251 (H) 65 - 99 mg/dL   BUN 15 6 - 24 mg/dL   Creatinine, Ser 0.42 (L) 0.57 - 1.00 mg/dL   GFR calc non Af Amer 122 >59 mL/min/1.73   GFR calc Af Amer 141 >59 mL/min/1.73   BUN/Creatinine Ratio 36 (H) 9 - 23   Sodium 139 134 - 144 mmol/L   Potassium 4.3 3.5 - 5.2 mmol/L   Chloride 102 96 - 106 mmol/L   CO2 20 20 - 29 mmol/L   Calcium 8.8 8.7 - 10.2 mg/dL   Total Protein 6.3 6.0 - 8.5 g/dL   Albumin 3.9 3.5 - 5.5 g/dL   Globulin, Total 2.4 1.5 - 4.5 g/dL   Albumin/Globulin Ratio 1.6 1.2 - 2.2   Bilirubin Total 0.3 0.0 - 1.2 mg/dL   Alkaline Phosphatase 75 39 - 117 IU/L   AST 7 0 - 40 IU/L   ALT 10 0 - 32 IU/L  CBC with Differential/Platelet     Status: None   Collection Time: 08/14/17  9:06 AM  Result Value Ref Range   WBC 8.0 3.4 - 10.8 x10E3/uL   RBC 4.75 3.77 - 5.28 x10E6/uL   Hemoglobin 14.1 11.1 - 15.9 g/dL   Hematocrit 41.4 34.0 - 46.6 %   MCV 87 79 - 97 fL   MCH 29.7 26.6 - 33.0 pg   MCHC 34.1 31.5 - 35.7 g/dL   RDW 13.8 12.3 - 15.4 %   Platelets 248 150 - 379 x10E3/uL   Neutrophils 57 Not Estab. %   Lymphs 33 Not Estab. %   Monocytes 5 Not Estab. %   Eos 3 Not Estab. %   Basos 1 Not Estab. %   Neutrophils Absolute 4.6 1.4 - 7.0 x10E3/uL   Lymphocytes Absolute 2.6 0.7 - 3.1 x10E3/uL   Monocytes Absolute 0.4 0.1 - 0.9 x10E3/uL   EOS (ABSOLUTE) 0.2 0.0 - 0.4 x10E3/uL   Basophils Absolute 0.0 0.0 - 0.2 x10E3/uL   Immature Granulocytes 1 Not Estab. %   Immature Grans (Abs) 0.0 0.0 - 0.1 x10E3/uL  NuSwab BV and Candida, NAA     Status: None   Collection Time: 08/26/17  2:56 PM  Result Value Ref Range   Atopobium vaginae Low - 0 Score   BVAB 2 Low - 0 Score   Megasphaera 1 Low - 0 Score    Comment: Calculate total score by  adding the 3 individual bacterial vaginosis (BV) marker scores together.  Total score is interpreted as follows: Total score 0-1: Indicates the absence of BV. Total score   2: Indeterminate for BV. Additional clinical                  data should be evaluated to establish a                  diagnosis. Total score 3-6: Indicates the presence of BV. This test was developed and its performance characteristics determined by [facility].  It has not been cleared or approved by the Food and Drug Administration.  The FDA has determined that such  clearance or approval is not necessary.    Candida albicans, NAA Negative Negative   Candida glabrata, NAA Negative Negative    Comment: This test was developed and its performance characteristics determined by [facility].  It has not been cleared or approved by the Food and Drug Administration.  The FDA has determined that such clearance or approval is not necessary.

## 2017-08-29 LAB — NUSWAB BV AND CANDIDA, NAA
CANDIDA ALBICANS, NAA: NEGATIVE
Candida glabrata, NAA: NEGATIVE

## 2017-09-23 ENCOUNTER — Encounter: Payer: Self-pay | Admitting: Family Medicine

## 2017-09-23 ENCOUNTER — Ambulatory Visit: Payer: BLUE CROSS/BLUE SHIELD | Admitting: Family Medicine

## 2017-09-23 VITALS — BP 113/78 | HR 90 | Ht 67.0 in | Wt 175.3 lb

## 2017-09-23 DIAGNOSIS — E1169 Type 2 diabetes mellitus with other specified complication: Secondary | ICD-10-CM

## 2017-09-23 DIAGNOSIS — Z634 Disappearance and death of family member: Secondary | ICD-10-CM | POA: Diagnosis not present

## 2017-09-23 DIAGNOSIS — F331 Major depressive disorder, recurrent, moderate: Secondary | ICD-10-CM

## 2017-09-23 DIAGNOSIS — E1165 Type 2 diabetes mellitus with hyperglycemia: Secondary | ICD-10-CM | POA: Diagnosis not present

## 2017-09-23 DIAGNOSIS — F4321 Adjustment disorder with depressed mood: Secondary | ICD-10-CM | POA: Diagnosis not present

## 2017-09-23 DIAGNOSIS — F5105 Insomnia due to other mental disorder: Secondary | ICD-10-CM | POA: Diagnosis not present

## 2017-09-23 DIAGNOSIS — E782 Mixed hyperlipidemia: Secondary | ICD-10-CM | POA: Diagnosis not present

## 2017-09-23 MED ORDER — ALPRAZOLAM 0.5 MG PO TABS: 0.2500 mg | ORAL_TABLET | ORAL | 0 refills | Status: DC | PRN

## 2017-09-23 MED ORDER — GLIPIZIDE ER 10 MG PO TB24: 10.0000 mg | ORAL_TABLET | Freq: Every day | ORAL | 1 refills | Status: DC

## 2017-09-23 MED ORDER — ZOLPIDEM TARTRATE 10 MG PO TABS: 10.0000 mg | ORAL_TABLET | Freq: Every evening | ORAL | 0 refills | Status: DC | PRN

## 2017-09-23 MED ORDER — BUPROPION HCL ER (XL) 300 MG PO TB24: 300.0000 mg | ORAL_TABLET | Freq: Every day | ORAL | 1 refills | Status: DC

## 2017-09-23 NOTE — Progress Notes (Signed)
Impression and Recommendations:    1. Uncontrolled type 2 diabetes mellitus with hyperglycemia, without long-term current use of insulin ([place])   2. Mixed diabetic hyperlipidemia associated with type 2 diabetes mellitus ([place])-  LDL at goal 81 with diet\lifestyle   3. Moderate episode of recurrent major depressive disorder ([place])   4. Family disruption due to death of family member- lost mother recently- causing acute distress   5. Insomnia secondary to situational depression     1. Diabetes - Patient continues having high fasting blood sugars. - She will begin taking 10 mg glipizide daily.  Continue metformin as prescribed.  - Patient believes that she could get her sugars down around 130's fasting, with dietary and activity modifications. - Patient will begin walking two fifteen minute sessions daily, M-F.  - Counseled patient on pathophysiology of disease and discussed various treatment options, which often includes dietary and lifestyle modifications as first line.  Importance of low carb/ketogenic diet discussed with patient in addition to regular exercise.   - Check FBS and 2 hours after the biggest meal of your day.  Keep log and bring in next OV for my review.   Also, if you ever feel poorly, please check your blood pressure and blood sugar, as one or the other could be the cause of your symptoms.  - If she cannot reduce her fasting blood sugars, we can start additional medicines.  - Will continue to monitor.   2. Mood - Increased bupropion to 300 mg.  - Discussed use of Xanax as okay to manage her acute stress, ONLY as needed. - Reviewed that Xanax should NOT be used for long-term management of anxiety or depression.  - Patient would like a recommendation for counseling - help with coping, grief, and also her marriage (individual plus marital). - Patient will call her insurance to find a list of approved providers.  - Recommended support groups for lost loved  ones.  - Patient tolerated Ambien well in the past while pregnant - prescribed for acute insomnia to restore sleep/wake cycles.  - Reviewed that we want her to treat her acute distress through as many natural causes as possible, including walking, attending counseling, using sleep meditation on youtube, exercise regiment, uplifting music.  - Advised to focus on her positive memories of her mother, and ask herself "what would mom want me to do."   3. General Health Maintenance - Advised patient to continue working toward exercising to improve health.    - Recommended that the patient eventually strive for at least 150 minutes of cardio per week according to guidelines established by the [facility].   - Healthy dietary habits encouraged, including low-carb, and high amounts of lean protein in diet.   - Patient should also consume adequate amounts of water - half of body weight in oz of water per day   Education and routine counseling performed. Handouts provided.   4. Follow-Up - Patient will return for follow-up in 6 weeks.   EXTENDED OV - Pt was in the office today for 36+ minutes, with over 50% time spent in face to face counseling of patients various medical conditions, treatment plans of those medical conditions including medicine management and lifestyle modification, strategies to improve health and well being; and in coordination of care. SEE ABOVE FOR DETAILS   Meds ordered this encounter  Medications  . glipiZIDE (GLUCOTROL XL) 10 MG 24 hr tablet    Sig: Take 1 tablet (10 mg total) by mouth  daily with breakfast.    Dispense:  90 tablet    Refill:  1  . buPROPion (WELLBUTRIN XL) 300 MG 24 hr tablet    Sig: Take 1 tablet (300 mg total) by mouth daily.    Dispense:  90 tablet    Refill:  1  . zolpidem (AMBIEN) 10 MG tablet    Sig: Take 1 tablet (10 mg total) by mouth at bedtime as needed for sleep.    Dispense:  90 tablet    Refill:  0  . ALPRAZolam (XANAX) 0.5 MG tablet     Sig: Take 0.5 tablets (0.25 mg total) by mouth as needed (only for panic attacks).    Dispense:  30 tablet    Refill:  0    Return in about 6 weeks (around 11/04/2017) for 6wks-increase Glucotrol, Buproprion, gave Ambien and Xanax prn.   The patient was counseled, risk factors were discussed, anticipatory guidance given.  Gross side effects, risk and benefits, and alternatives of medications discussed with patient.  Patient is aware that all medications have potential side effects and we are unable to predict every side effect or drug-drug interaction that may occur.  Expresses verbal understanding and consents to current therapy plan and treatment regimen.  Please see AVS handed out to patient at the end of our visit for further patient instructions/ counseling done pertaining to today's office visit.    Note: This document was prepared using Dragon voice recognition software and may include unintentional dictation errors.  This document serves as a record of services personally performed by Mellody Dance, DO. It was created on her behalf by Toni Amend, a trained medical scribe. The creation of this record is based on the scribe's personal observations and the provider's statements to them.   I have reviewed the above medical documentation for accuracy and completeness and I concur.  Mellody Dance 09/23/17 4:18 PM    Subjective:    Chief Complaint  Patient presents with  . Follow-up    Lindsey Mason is a 47 y.o. female who presents to [facility] today for Diabetes Management.    Restarted diabetes meds on 08/04/2017.  Came in to review recent labs on 08/26/2017.  Mood Mood has been poor recently because her mother passed away nearly four weeks ago. Notes that she is doing okay - she is supporting her family.  Worried about her sister and brother, and her father.  She has never taken Xanax before.  Has been on 150 mg Wellbutrin for  over a year. Notes "I get inside my thoughts" and it's an overwhelming flood.  Is able to function - keeps going to work. 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  Work has been very supportive of her during this time.  DM HPI: -  She has been working on diet and exercise for diabetes.  Has been following a modified "keto type" diet.  Pt is currently maintained on the following medications for diabetes:   see med list today Medication compliance - Taking full dose of glucotrol 5 mg daily with breakfast and at night, plus 4 tablets of metformin at night.  Home glucose readings range: Average between 150 and 170 fasting.  Feels like she is "levelling out" at these readings. Notes that this is unchanged since a month ago, but much better than where it was in January.  Notes that her post-prandial readings are 120 and under.  She has not had any low glucose symptoms since 08/26/2017.   Denies polyuria/polydipsia. Denies hypo/ hyperglycemia symptoms - She denies new onset of: chest pain, exercise intolerance, shortness of breath, dizziness, visual changes, headache, lower extremity swelling or claudication.   Last diabetic eye exam was  Lab Results  Component Value Date   HMDIABEYEEXA No Retinopathy 11/08/2013    Foot exam- UTD  Last A1C in the office was:  Lab Results  Component Value Date   HGBA1C >14 08/04/2017   HGBA1C 7.2 (H) 03/14/2016   HGBA1C 8.1 (H) 12/13/2015    Lab Results  Component Value Date   MICROALBUR 10 08/04/2017   LDLCALC 81 08/14/2017   CREATININE 0.42 (L) 08/14/2017      Last 3 blood pressure readings in our office are as follows: BP Readings from Last 3 Encounters:  09/23/17 113/78  08/26/17 102/70  08/04/17 122/84    BMI Readings from Last 3 Encounters:  09/23/17 27.46 kg/m  08/26/17 28.19 kg/m  08/04/17 26.99 kg/m     No problems updated.    Patient Care Team    Relationship Specialty  Notifications Start End  Mellody Dance, DO PCP - General Family Medicine  08/14/17   Marin Comment, My [place], [place] Referring Physician Optometry  08/04/17      Patient Active Problem List   Diagnosis Date Noted  . Mixed diabetic hyperlipidemia associated with type 2 diabetes mellitus ([place]) 08/26/2017    Priority: High  . Uncontrolled type 2 diabetes mellitus with hyperglycemia, without long-term current use of insulin ([place]) 09/03/2016    Priority: High  . Major depressive disorder, recurrent episode ([place]) 08/20/2010    Priority: High  . Hyperlipidemia associated with type 2 diabetes mellitus ([place]) 04/11/2009    Priority: High  . Poorly controlled type 2 diabetes mellitus ([place]) 03/08/2009    Priority: High  . Vitamin D deficiency 08/26/2017  . Abnormal weight gain 09/03/2016  . Depression 09/03/2016  . Overweight (BMI 25.0-29.9) 09/03/2016  . Witnessed episode of apnea 09/03/2016  . Urinary frequency 04/03/2016  . 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 PCOS (polycystic ovarian syndrome) 05/27/2012  . 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 GERD 03/08/2009     Past Medical History:  Diagnosis Date  . Diabetes mellitus without mention of complication   . Esophageal reflux   . Headache(784.0)   . PCOS (polycystic ovarian syndrome) 05/27/2012  . Pure hypercholesterolemia   . Rosacea      Past Surgical History:  Procedure Laterality Date  . APPENDECTOMY    . CESAREAN SECTION     triplets and singles  . TUBAL LIGATION       Family History  Problem Relation Age of Onset  . Cancer Mother        breast  . Heart disease Mother   . Stroke Mother   . Diabetes Mother   . Diabetes Father   . Cancer Sister        breast     Social History   Substance and Sexual Activity  Drug Use No  ,  Social History   Substance and Sexual Activity  Alcohol Use No   Comment: occ  ,  Social History   Tobacco Use  Smoking Status Former Smoker  .  Packs/day: 1.00  . Years: 15.00  . Pack years: 15.00  . Types: Cigarettes  . Last attempt to quit: 03/04/2004  . Years since quitting: 13.5  Smokeless Tobacco Never Used  ,  Current Outpatient Medications on File Prior to Visit  Medication Sig Dispense Refill  . metFORMIN (GLUCOPHAGE-XR) 500 MG 24 hr tablet Take 4 tablets (2,000 mg total) by mouth daily with breakfast. 360 tablet 1  . Vitamin D, Ergocalciferol, (DRISDOL) 50000 units CAPS capsule Please take 1 tablet Wednesday and 1 tablet Sunday every week 24 capsule 3   No current facility-administered medications on file prior to visit.      Allergies  Allergen Reactions  . Lantus [Insulin Glargine] Other (See Comments)    headaches     Review of Systems:   General:  Denies fever, chills Optho/Auditory:   Denies visual changes, blurred vision Respiratory:   Denies SOB, cough, wheeze, DIB  Cardiovascular:   Denies chest pain, palpitations, painful respirations Gastrointestinal:   Denies nausea, vomiting, diarrhea.  Endocrine:     Denies new hot or cold intolerance Musculoskeletal:  Denies joint swelling, gait issues, or new unexplained myalgias/ arthralgias Skin:  Denies rash, suspicious lesions  Neurological:    Denies dizziness, unexplained weakness, numbness  Psychiatric/Behavioral:   Denies mood changes    Objective:     Blood pressure 113/78, pulse 90, height 5\' 7"  (1.702 m), weight 175 lb 4.8 oz (79.5 kg), SpO2 98 %.  Body mass index is 27.46 kg/m.  General: Well Developed, well nourished, and in no acute distress.  HEENT: Normocephalic, atraumatic, pupils equal round reactive to light, neck supple, No carotid bruits, no JVD Skin: Warm and dry, cap RF less 2 sec Cardiac: Regular rate and rhythm, S1, S2 WNL's, no murmurs rubs or gallops Respiratory: ECTA B/L, Not using accessory muscles, speaking in full sentences. NeuroM-Sk: Ambulates w/o assistance, moves ext * 4 w/o difficulty, sensation grossly intact.   Ext: scant edema b/l lower ext Psych: No HI/SI, judgement and insight good, Euthymic mood. Full Affect.

## 2017-09-23 NOTE — Patient Instructions (Signed)
-Start off with the alprazolam at one half tab and see effect and then move to 1 full tablet as needed severe distress  Please start off by taking one half of the Ambien as needed for help with sleep.  This is okay to take every night.  However again, please note this is temporary     If you have insomnia or difficulty sleeping, this information is for you:  - Avoid caffeinated beverages after lunch,  no alcoholic beverages,  no eating within 2-3 hours of lying down,  avoid exposure to blue light before bed,  avoid daytime naps, and  needs to maintain a regular sleep schedule- go to sleep and wake up around the same time every night.   - Resolve concerns or worries before entering bedroom:  Discussed relaxation techniques with patient and to keep a journal to write down fears\ worries.  I suggested seeing a counselor for CBT.   - Recommend patient meditate or do deep breathing exercises to help relax.   Incorporate the use of white noise machines or listen to "sleep meditation music", or recordings of guided meditations for sleep from YouTube which are free, such as  "guided meditation for detachment from over thinking"  by Mayford Knife.     Behavioral Health/ Counseling Referrals    Dr. Tomi Bamberger, PHD Dr. Tomi Bamberger, PHD is a counselor in [place], [place].  [address] [place], [place] [address] [place] [phone]   Kristie Cowman, [place]  33 [phone] [email or web address] ( she does [place] and faith-based coaching and counseling )   [employer]- ( faith-based counseling ) Address: [place]. [place], [place] [address] [phone] [address] for appointments [phone] Fax Hours: Monday - Thursday 8:00am-6:00pm Closed for lunch 12-1Thursday only Friday: Closed all day   [facility] Nunzio Cobbs, LCSW, ACSW, M.ED.  -Nunzio Cobbs is a licensed clinical social  worker in practice over 35 years and with Dr. Chucky May for the last 10 years.  -She sees adults, adolescents, children & families and couples. -Services are provided for mood and anxiety disorders, marital issues, family or parent/child problems, parenting, co-dependency, gender issues, trauma, grief, and stages of life issues. She also provides critical incident stress debriefing.  -Pamala Hurry accepts many employee assistance programs (EAP), [employer], Pharmacist, hospital.  PHONE  [phone]                FAX [phone]   Rodena Goldmann [email or web address] [employer]- gen counseling;  PHD   Wilber Oliphant, MSW [address] [place]   [facility] Apolonio Schneiders, PhD [address] [place] [phone]   [place] and Psychological- children [address] [place], [place] [phone]   Lanelle Bal Professional Counselor Counseling and [employer] [place] abuse [facility] Manager [address] [place] [phone] [place] [address], [place] Delmer Islam, PhD Oneida Arenas, PhD Leitha Bleak, LCSW Jillene Bucks, PhD-child, adolescent and adults   [facility] [address], [place] [phone] Merilyn Baba, MS-child, adolescent and adults Lennart Pall, PhD-adolescent and adults   [facility]-grief, terminal illness [place], [place] [address], [employer] [address], [place],  [facility] [phone]   [facility] [address], [place], [place] [phone]   [facility] [address] [place] [phone]   [facility] [address], [place]   [facility] [address] [place] [address], [place]

## 2017-10-20 ENCOUNTER — Ambulatory Visit (INDEPENDENT_AMBULATORY_CARE_PROVIDER_SITE_OTHER): Payer: BLUE CROSS/BLUE SHIELD | Admitting: Family Medicine

## 2017-10-20 ENCOUNTER — Ambulatory Visit: Payer: Self-pay | Admitting: Adult Health

## 2017-10-20 ENCOUNTER — Encounter: Payer: Self-pay | Admitting: Family Medicine

## 2017-10-20 VITALS — BP 119/72 | HR 84 | Temp 98.8°F | Resp 18 | Ht 66.0 in | Wt 177.0 lb

## 2017-10-20 DIAGNOSIS — H6981 Other specified disorders of Eustachian tube, right ear: Secondary | ICD-10-CM | POA: Diagnosis not present

## 2017-10-20 DIAGNOSIS — J329 Chronic sinusitis, unspecified: Secondary | ICD-10-CM

## 2017-10-20 DIAGNOSIS — H6991 Unspecified Eustachian tube disorder, right ear: Secondary | ICD-10-CM

## 2017-10-20 MED ORDER — FLUTICASONE PROPIONATE 50 MCG/ACT NA SUSP: NASAL | 2 refills | Status: AC

## 2017-10-20 NOTE — Progress Notes (Signed)
Acute Care Office visit  Assessment and plan:  1. Rhinosinusitis   2. Eustachian tube dysfunction, right    -take OTC decongestant medications. -if symptoms worsen or you develop fevers, call the office and we will prescribe abx. She may need to come back into the office to be reevaluated.   -wear an N95 mask when going out into allergens. -wash your face regularly after coming inside from being outside.  -she declines steroid shot today. -do neti pot or AYR sinus rinses followed by 1 spray of flonase in each nostril, twice a day.    - Supportive care and various OTC medications discussed in addition to any prescribed. - Call or RTC if new symptoms, or if no improvement or worse over next several days.   - Will consider ABX if sx continue past 10 days and worsening if not already given.    Meds ordered this encounter  Medications  . fluticasone (FLONASE) 50 MCG/ACT nasal spray    Sig: 1 spray each nostril after sinus rinse twice daily    Dispense:  16 g    Refill:  2    No orders of the defined types were placed in this encounter.   Gross side effects, risk and benefits, and alternatives of medications discussed with patient.  Patient is aware that all medications have potential side effects and we are unable to predict every sideeffect or drug-drug interaction that may occur.  Expresses verbal understanding and consents to current therapy plan and treatment regiment.   Education and routine counseling performed. Handouts provided.  Anticipatory guidance and routine counseling done re: condition, txmnt options and need for follow up. All questions of patient's were answered.  Return if symptoms worsen or fail to improve, for please follow-up for chronic care as previously discussed.  Please see AVS handed out to patient at the end of our visit for additional patient instructions/ counseling done pertaining to today's office visit.  Note: This document was partially  repared using Dragon voice recognition software and may include unintentional dictation errors.   This document serves as a record of services personally performed by Mellody Dance, DO. It was created on her behalf by Mayer Masker, a trained medical scribe. The creation of this record is based on the scribe's personal observations and the provider's statements to them.   I have reviewed the above medical documentation for accuracy and completeness and I concur.  Mellody Dance 10/28/17 2:50 PM    Subjective:    Chief Complaint  Patient presents with  . Ear Pain    right    HPI:  Pt presents with Sx for 10-14 days    C/o: R ear pain. She states over 2 weeks ago, everyone in her household got a cold. She hears clicking when she opens her mouth. Now her R ear aches and she has pain she describes as an ache around her neck on the R. She also has swollen lymph nodes. She states her pain changes throughout the day. She has associated HA in the back of her head (she describes this as a pressure) and congestion. She denies fever but feels warm.   Denies: fever (99), chills    For symptoms patient has tried:  NA  Overall getting:   Worse.   Patient Care Team    Relationship Specialty Notifications Start End  Mellody Dance, DO PCP - General Family Medicine  08/14/17   Marin Comment, My [place], [place] Referring Physician Optometry  08/04/17  Past medical history, Surgical history, Family history reviewed and noted below, Social history, Allergies, and Medications have been entered into the medical record, reviewed and changed as needed.   Allergies  Allergen Reactions  . Lantus [Insulin Glargine] Other (See Comments)    headaches    Review of Systems: - see above HPI for pertinent positives General:   No F/C, wt loss Pulm:   No DIB, pleuritic chest pain Card:  No CP, palpitations Abd:  No n/v/d or pain Ext:  No inc edema from baseline   Objective:   Blood pressure 119/72, pulse 84,  temperature 98.8 F (37.1 C), temperature source Oral, resp. rate 18, height 5\' 6"  (1.676 m), weight 177 lb (80.3 kg), last menstrual period 08/28/2017, SpO2 99 %. Body mass index is 28.57 kg/m. General: Well Developed, well nourished, appropriate for stated age.  Neuro: Alert and oriented x3, extra-ocular muscles intact, sensation grossly intact.  HEENT: Normocephalic, atraumatic, pupils equal round reactive to light, neck supple, no masses, no painful lymphadenopathy, Nares- patent, clear d/c, OP- clear, mild erythema, No TTP sinuses. Ear- TMs clear, good light reflex, minimal bulging. No effusion, no air fluid levels.  Skin: Warm and dry, no gross rash. Cardiac: RRR, S1 S2,  no murmurs rubs or gallops.  Respiratory: ECTA B/L and A/P, Not using accessory muscles, speaking in full sentences- unlabored. Vascular:  No gross lower ext edema, cap RF less 2 sec. Psych: No HI/SI, judgement and insight good, Euthymic mood. Full Affect.

## 2017-10-20 NOTE — Patient Instructions (Signed)
You can use over-the-counter afrin nasal spray for up to 3 days (NO longer than that) which will help acutely with nasal drainage/ congestion short term.     Also, sterile saline nasal rinses, such as Milta Deiters med or AYR sinus rinses, can be very helpful and should be done twice daily- especially throughout the allergy season.   Remember you should use distilled water or previously boiled water to do this.  Then you may use over-the-counter Flonase 1 spray each nostril twice daily after sinus rinses.  You can do this in addition to taking any Allegra or Claritin or Zyrtec etc. that you may be taking daily.  If your eyes tend to get an itchy or irritated feeling when your seasonal allergies get bad, you can use Naphcon-A over-the-counter eyedrops as needed      How to Treat Vertigo at Home with Exercises  What is Vertigo?  Vertigo is a relatively common symptom most often associated with conditions such as sinusitis (inflammation of your sinuses due to viruses, allergies, or bacterial infections), or an inner ear infection or ear trauma.   It can be brought on by trauma (e.g. a blow to the head or whiplash) or more serious things like minor strokes.   Symptoms can also be brought on by normal degenerative changes to your inner ear that occur with aging.  The condition tends to be more commonly seen in the elderly but it can occur in all ages.    Patients most often complain of dizziness, as if the room is spinning around them.   Symptoms are provoked by quick head movements or changes in position like going from standing to lying in bed, or even turning over in bed.   It may present with nausea and/or vomiting, and can be very debilitating to some folks.    By far the most common cause, known as Benign Paroxysmal Positional Vertigo (BPPV), is categorized by a sudden onset of symptoms, that are intense but short-lived (60 seconds or less), which is triggered by a change in head position.   Symptoms  usually dissipate if you stay in one position and do not move your head.   Within the inner ear are collections of calcium carbonate crystals referred to as "otoliths" which may become dislodged from their normal position and migrate into the semicircular canals of the inner ear, throwing off your body's ability to sense where you are in space.     Fig. 921 Anatomy of the Right Osseous Labyrinth. Antonieta Iba. Anatomy of the Human Body. 1918.            What Else Could Be Behind My Vertigo?  Some other causes of vertigo include:  Meniere's disease (disorder of inner ear with ringing in ears, feeling of fullness/pressure within ear, and fluctuating hearing loss) Tumours Neurological disorders e.g. Multiple Sclerosis Motion Sickness (lack of coordination between visual stimuli, inner ear balance and positional sense) Migraine Labyrinthitis (inflammation of the fluid-filled tubes and sacs within the inner ear; may also be associated with changes in hearing) Vestibular neuritis (inflammation of the nerves associated with transmission of sensory info from the inner ear; usually of viral origins)  How it can be treated/cured? While certain medications have been prescribed for vertigo including Lorazepam your doing well 7 house the house going organizing and getting things ready for sale with the and Meclizine (for motion sickness), there exists no evidence to support a recommendation of any medication in the routine treatment of BPPV.  Clinical trials have demonstrated that repositioning techniques (listed below) are a superior option for management Otis Dials et al., 2008).    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 For right ear BPPV, the procedure has to be performed in the opposite direction, starting with the head turned to the right side.  1. Start by sitting on a bed with your head  turned 45 to the left. Place a pillow behind you so that on lying back it will be under your shoulders.  2. Lie back quickly with shoulders on the pillow, neck extended, and head resting on the bed. In this position, the affected (left) ear is underneath. Wait for 30 secondS.  3. Turn your head 90 to the right (without raising it), and wait again for 30 seconds.  4. Turn your body and head another 90 to the right, and wait for another 30 seconds.  5. Sit up on the right side. This maneuver should be performed three times a day. Repeat this daily until you are free from positional vertigo for 24 hours.   (B) Instructions for the modified Semont maneuver (MSM) for left ear PC-BPPV. For right ear BPPV, the maneuver has to be performed in the opposite direction, starting with the head turned toward the left ear.  1. Sit upright on a bed with your head turned 45 toward the right ear.  2. Drop quickly to the left side, so that your head touches the bed behind your left ear. Wait 30 seconds.  3. Move head and trunk in a swift movement toward the other side without stopping in the upright position, so that your head comes to rest on the right side of your forehead. Wait again for 30 seconds.  4. Sit up again.  This maneuver should be performed three times a day. Repeat this daily until you are free from positional vertigo symptoms for 24 hours.   (   See the video in the supplementary material on the Neurology[employer] site; go to http://www.neurology.org/content/63/1/150/F1.expansion.html   )     You can also try this motion at home as well- Self-Treatment of Benign Paroxysmal Positional Vertigo Benign Paroxysmal Positioning Vertigo is caused by loose inner ear crystals in the inner ear that migrate while sleeping to the back-bottom inner ear balance canal, the so-called "posterior semi-circular canal." The maneuver demonstrated below is the way to reposition the loose crystals so that the symptoms caused  by the loose crystals go away. You may have a floating, swaying sense while walking or sitting for a few days after this procedure.

## 2017-10-22 ENCOUNTER — Encounter: Payer: Self-pay | Admitting: Family Medicine

## 2017-11-03 ENCOUNTER — Ambulatory Visit: Payer: Self-pay | Admitting: Family Medicine

## 2017-11-04 ENCOUNTER — Encounter: Payer: Self-pay | Admitting: Family Medicine

## 2017-11-04 ENCOUNTER — Ambulatory Visit: Payer: BLUE CROSS/BLUE SHIELD | Admitting: Family Medicine

## 2017-11-04 VITALS — BP 116/79 | HR 89 | Ht 66.0 in | Wt 181.9 lb

## 2017-11-04 DIAGNOSIS — E1165 Type 2 diabetes mellitus with hyperglycemia: Secondary | ICD-10-CM | POA: Diagnosis not present

## 2017-11-04 DIAGNOSIS — E663 Overweight: Secondary | ICD-10-CM | POA: Diagnosis not present

## 2017-11-04 DIAGNOSIS — E1169 Type 2 diabetes mellitus with other specified complication: Secondary | ICD-10-CM | POA: Diagnosis not present

## 2017-11-04 DIAGNOSIS — F5104 Psychophysiologic insomnia: Secondary | ICD-10-CM

## 2017-11-04 DIAGNOSIS — E782 Mixed hyperlipidemia: Secondary | ICD-10-CM | POA: Diagnosis not present

## 2017-11-04 DIAGNOSIS — F331 Major depressive disorder, recurrent, moderate: Secondary | ICD-10-CM

## 2017-11-04 DIAGNOSIS — E282 Polycystic ovarian syndrome: Secondary | ICD-10-CM | POA: Diagnosis not present

## 2017-11-04 DIAGNOSIS — J3089 Other allergic rhinitis: Secondary | ICD-10-CM | POA: Diagnosis not present

## 2017-11-04 DIAGNOSIS — E785 Hyperlipidemia, unspecified: Secondary | ICD-10-CM | POA: Diagnosis not present

## 2017-11-04 LAB — POCT GLYCOSYLATED HEMOGLOBIN (HGB A1C): HEMOGLOBIN A1C: 9.6

## 2017-11-04 MED ORDER — SEMAGLUTIDE (1 MG/DOSE) 2 MG/1.5ML ~~LOC~~ SOPN: 1.0000 mg | PEN_INJECTOR | SUBCUTANEOUS | 3 refills | Status: DC

## 2017-11-04 NOTE — Patient Instructions (Addendum)
Your can start by taking 0.25 mg subcu every week x4 weeks.   Then increase to 0.5 mg weekly for 4 weeks then increase to 1 mg weekly.    Our goal is to get your fasting blood sugars less than 120 on a regular basis but not have any lows.  -As mentioned, we would rather have you on this to help with weight loss etc. and dial back down on your Glucotrol if need be in the future.    -Keep me abreast and we will follow you closely as well.  - -Great job on the walking please continue to try to reach a goal of at least 30 minutes every day and more on days you can spend more time walking.   What is Chronic Stress Syndrome, Symptoms & Ways to Deal With it   What is Chronic Stress Syndrome?  Chronic Stress Syndrome is something which can now be called as a medical condition due to the amount of stress an individual is going through these days. Chronic Stress Syndrome causes the body and mind to shutdown and the person has no control over himself or herself. Due to the demands of modern day life and the hardship throughout day and night takes its toll over a period of time and the body and brain starts demanding rest and a break. This leads to certain symptoms where your performance level starts to dip at work, you become irritable both at work and at home, you may stop enjoying activities you previously liked, you may become depressed, you may get angry for even small things. Chronic Stress Syndrome can significantly impact your quality life. Thus it is important understand the symptoms of Chronic Stress Syndrome and react accordingly in order to cope up with it.  It is important to note here that a balanced work-home equation should be drawn to cut down symptoms of Chronic Stress Syndrome. Minor stressors can be overcome by the body's inbuilt stress response but when there is unending stress for a long period of time then an external help is required to ease the stress.  Chronic Stress Syndrome can  physically and psychologically drain you over a period of time. For such cases stress management is the best way to cope up with Chronic Stress Syndrome. If Chronic Stress Syndrome is not treated then it may result in many health hazards like anxiety, muscle pain, insomnia, and high blood pressure along with a compromised immune system leading to frequent infections and missed days from work.    What are the Symptoms of Chronic Stress Syndrome?   The symptoms of Chronic Stress Syndrome are variable and range from generalized symptoms to emotional symptoms along with behavioral and cognitive symptoms. Some of these symptoms have been delineated below:  Generalized Symptoms of Chronic Stress Syndrome are: Anxiety Depression Social isolation Headache Abdominal pain Lack of sleep Back pain Difficulty in concentrating Hypertension Hemorrhoids Varicose veins Panic attacks/ Panic disorder Cardiovascular diseases.   Some of the Emotional Symptoms of Chronic Stress Syndrome are: To become easily agitated, moody and frustrated Feeling overwhelmed which makes you feel like you are losing control. Having difficulty relaxing and have a peaceful mind Having low self esteem Feeling lonely Feeling worthless Feeling depressed Avoiding social environment.   Some of the Physical Symptoms of Chronic Stress Syndrome are: Headaches Lethargy Alternating diarrhea and constipation Nausea Muscles aches and pains Insomnia Rapid heartbeat and chest pain Infections and frequent colds Decreased libido Nervousness and shaking Tinnitus Sweaty palms Dry  mouth Clenched jaw.  Some of the Cognitive Symptoms of Chronic Stress Syndrome are: Constant worrying Racing thoughts Disorganization and forgetfulness Inability to focus Poor judgment Abundance of negativity.  Some of the Behavioral Symptoms of Chronic Stress Syndrome are: Changes in appetite with less desire to eat Avoiding  responsibilities Indulgence in alcohol or recreational drug use Increased nail biting and being fidgety Ways to Deal With Chronic Stress Syndrome    Chronic Stress Syndrome is not something which cannot be addressed. A bit of effort from your side in the form of lifestyle modifications, a little bit of exercise, a balanced work life equation can do wonders and help you get rid of Chronic Stress Syndrome.  Get Proper Sleep: It has been proved that Chronic Stress Syndrome causes loss of sleep where an individual may not even be able to sleep for days unending. This may result in the individual feeling lethargic and unable to focus at work the following morning. This may lead to decreased performance at work. Thus, it is important to have a good sleep-wake cycle. For this, try and not drink any caffeinated beverage about four hours prior to going to sleep, as caffeine pumps up the adrenaline and causes you to stay awake resulting ultimately in Chronic Stress Syndrome.  Avoid Alcohol and Drugs: Another way to get rid of Chronic Stress Syndrome is lifestyle modifications. Stay away from alcohol and other recreational drugs. Take Short Frequent Breaks at Work: Try to take frequent breaks from work and do not work continuously. Try and manage your work in such a way that you even meet your deadline and come home on time for a happy dinner with family. A good time spent with family and kids does wonders in not only dealing with Chronic Stress Syndrome but also preventing it.  Become Physically Active: Another step towards getting rid of Chronic Stress Syndrome is physical activity. If you do not have time to spend at the gym then at least try and go for daily walks for about half an hour a day which not only keeps the stress away but also is good for your overall health. Physical activity leads to production of endorphins which will make you feel relaxed and feel good.  Healthy Diet Can Help You Deal With  Chronic Stress Syndrome: Have a balanced and healthy diet is another step towards a stress free life and keeping Chronic Stress Syndrome at [employer]. If time is a constraint then you can try eating three small meals a day. Try and avoid fast foods and take foods which are healthy and rich in proteins, fiber, and carbohydrates to boost your energy system.  Music Can Soothe Your Mind: Light music is one of the best and most effective relaxation techniques that one can try to overcome stress. It has shown to calm down the mind and take you away from all the stressors that you may be having. These days it is also being used as a therapy in some institutes for overcoming stress. It is important here to discuss the importance of a good social support system for patients with Chronic Stress Syndrome, as a good social support framework can do wonders in taking the stress away from the patient and overcoming Chronic Stress Syndrome.  Meditation Can Help You Deal With Chronic Stress Syndrome Effectively: Meditation and yoga has also shown to be quite effective in relaxing the mind and coping up with Chronic Stress Syndrome   In cases where these measures are not helpful,  then it is time for you to consult with a skilled psychologist or a psychiatrist for potential therapies or medications to control the stress response.   The psychologist can help you with a variety of steps for coping up with Chronic Stress Syndrome. Relaxation techniques and behavioral therapy are some of the methods employed by psychologists. In some cases, medications can also be given to help relax the patient.  Since Chronic Stress Syndrome is both emotionally and physically draining for the patient and it also adversely affects the family life of the patient hence it is important for the patient to recognize the condition and taking steps to cope up with it. Escaping measures like alcohol and drug use are of no help as they only aggravate the  condition apart from their other health hazards. If this condition is ignored or left untreated it can lead to various medical conditions like anxiety and depression and various other medical conditions.  Last but not least, smile as often as you can as it is the best gift that you can give to someone. The best way to stay relaxed is to have a good smile, exercise daily, spend time with your family, meditation and if required consultation with a good psychologist so that you can live a stress free life and overcome the symptoms of Chronic Stress Syndrome.

## 2017-11-04 NOTE — Progress Notes (Signed)
Impression and Recommendations:    1. Uncontrolled type 2 diabetes mellitus with hyperglycemia, without long-term current use of insulin ([place])   2. Mixed diabetic hyperlipidemia associated with type 2 diabetes mellitus ([place])   3. Hyperlipidemia associated with type 2 diabetes mellitus ([place])   4. Moderate episode of recurrent major depressive disorder ([place])   5. PCOS (polycystic ovarian syndrome)   6. Overweight (BMI 25.0-29.9)   7. Environmental and seasonal allergies   8. Psychophysiological insomnia     1. Diabetes - Patient continues having high fasting blood sugars (around 150).   - Reviewed goal A1c of less than 7.  - Patient will begin once weekly injection Ozempic.  Reviewed risks and benefits of medication with the patient. - Education provided today on injection and tapering up to full dose.  - Patient tolerates current meds well.  Continue taking glipizide and metformin as prescribed.  - Goal reviewed as fasting sugars regularly less than 120 without lows.   - Patient should avoid bread, pasta, rice, potatoes; simple carbs as much as possible.  - Patient still believes that she can lower her fasting sugars with further dietary and activity modifications. - Patient will continue walking two fifteen minute sessions daily, M-F.  - Counseled patient on pathophysiology of disease and discussed various treatment options, which often includes dietary and lifestyle modifications as first line.  Importance of low carb/ketogenic diet discussed with patient in addition to regular exercise.   - Check FBS and 2 hours after the biggest meal of your day.  Keep log and bring in next OV for my review.   Also, if you ever feel poorly, please check your blood pressure and blood sugar, as one or the other could be the cause of your symptoms.  - Will continue to monitor closely.   2. Mood - Increased bupropion to 300 mg last appointment.   - Patient tolerates dose well with  improvement of symptoms.  Continue as prescribed.  - Discussed use of Xanax as okay to manage her acute stress, ONLY as needed. - Reviewed that Xanax should NOT be used for long-term management of anxiety or depression.  - Patient should continue obtaining weekly counseling with her husband.  - Reviewed that we want her to enhance her mood through as many natural avenues as possible, including walking, attending counseling, using sleep meditation on youtube, exercise regiment, uplifting music.  - Advised to focus on her positive memories of her mother, and ask herself "what would mom want me to do."   3. Insomnia - Patient tolerates Ambien well, but uses very sparingly.  - Reviewed that Ambien is prescribed short-term for acute insomnia and to restore sleep/wake cycles.  If she does not need it, advised the patient not to use it.  - Patient may use up to 10 mg melatonin nightly instead of Ambien.   4. General Health Maintenance - Advised patient to continue working toward exercising to improve health.    - Recommended that the patient eventually strive for at least 150 minutes of moderate cardiovascular activity per week according to guidelines established by the [facility].   - Advised patient to begin strength training to improve body composition.  - Healthy dietary habits encouraged, including low-carb, and high amounts of lean protein in diet.   - Patient should also consume adequate amounts of water - half of body weight in oz of water per day.   5. Follow-Up - Patient will return for follow-up in 4-6  weeks to evaluate addition of Ozempic.   Education and routine counseling performed. Handouts provided.   Orders Placed This Encounter  Procedures  . POCT glycosylated hemoglobin (Hb A1C)    Meds ordered this encounter  Medications  . Semaglutide (OZEMPIC) 1 MG/DOSE SOPN    Sig: Inject 1 mg into the skin once a week.    Dispense:  12 pen    Refill:  3    Return  for 4-6wks f/up starting semaglutide/ozempic-bring BS log.   The patient was counseled, risk factors were discussed, anticipatory guidance given.  Gross side effects, risk and benefits, and alternatives of medications discussed with patient.  Patient is aware that all medications have potential side effects and we are unable to predict every side effect or drug-drug interaction that may occur.  Expresses verbal understanding and consents to current therapy plan and treatment regimen.  Please see AVS handed out to patient at the end of our visit for further patient instructions/ counseling done pertaining to today's office visit.    Note: This document was prepared using Dragon voice recognition software and may include unintentional dictation errors.  This document serves as a record of services personally performed by Mellody Dance, DO. It was created on her behalf by Toni Amend, a trained medical scribe. The creation of this record is based on the scribe's personal observations and the provider's statements to them.   I have reviewed the above medical documentation for accuracy and completeness and I concur.  Mellody Dance 11/04/17 12:25 PM   ------------------------------------------------------------------------------------- -------------------------------------------------------------------------------------   Subjective:    Chief Complaint  Patient presents with  . Follow-up    Shanay Jasmina Gendron is a 47 y.o. female who presents to [place] [facility] today for Diabetes Management.    Here today for 6 week follow-up on addition of Glucotrol, increase in Bupropion, and starting Ambien and Xanax prn.  Mood Patient is feeling better since increase on bupropion.  Notes that her vision is kind of blurry on it, and remembers this happening when she first started bupropion in the past.  She plans on visiting the eye doctor to have this evaluated.    Emotionally, she feels the increase in bupropion has helped her feel less sad.  The more that she gets it into her system, the more she's able to feel like doing other things, "instead of just doing what I have to do to get through the day."  She is finding more joy in life.  Has only had to use the Xanax twice.  Sleep Has used the Ambien once for sleep aid.  Notes that she's sleeping better.  Notes she's just been tired and exhausted lately, which is helping her fall asleep.  Getting to sleep initially has been okay.  She's had some dreams about her mom off and on for the past several weeks, which wake her up.  DM HPI: -  She has been working on diet and exercise for diabetes.  Patient and husband started going to once-weekly counseling together, and began exercising together.  They've begun going for walks together, and she's been exercising while at work sometimes.  Notes that she's hit a wall on weight loss and has gained 4 lbs in the past month.  Pt is currently maintained on the following medications for diabetes:   see med list today Medication compliance - Continues taking max dose of metformin (2000) plus glucotrol 10 mg.  Home glucose readings range on added  glucotrol: Fasting sugars have been on average 150-148. Two hours postprandial readings have been about the same.   Denies polyuria/polydipsia. Denies hypo/ hyperglycemia symptoms - She denies new onset of: chest pain, exercise intolerance, shortness of breath, dizziness, visual changes, headache, lower extremity swelling or claudication.   Last diabetic eye exam was  Lab Results  Component Value Date   HMDIABEYEEXA No Retinopathy 11/08/2013    Foot exam- UTD  Last A1C in the office was:  Lab Results  Component Value Date   HGBA1C 9.6 11/04/2017   HGBA1C >14 08/04/2017   HGBA1C 7.2 (H) 03/14/2016    Lab Results  Component Value Date   MICROALBUR 10 08/04/2017   [place] 81 08/14/2017   CREATININE 0.42 (L)  08/14/2017      Last 3 blood pressure readings in our office are as follows: BP Readings from Last 3 Encounters:  11/04/17 116/79  10/20/17 119/72  09/23/17 113/78    BMI Readings from Last 3 Encounters:  11/04/17 29.36 kg/m  10/20/17 28.57 kg/m  09/23/17 27.46 kg/m     Problem  Environmental and Seasonal Allergies  Psychophysiological Insomnia      Patient Care Team    Relationship Specialty Notifications Start End  Mellody Dance, DO PCP - General Family Medicine  08/14/17   Marin Comment, [place], [place] Referring Physician Optometry  08/04/17      Patient Active Problem List   Diagnosis Date Noted  . Mixed diabetic hyperlipidemia associated with type 2 diabetes mellitus ([place]) 08/26/2017    Priority: High  . Uncontrolled type 2 diabetes mellitus with hyperglycemia, without long-term current use of insulin ([place]) 09/03/2016    Priority: High  . Major depressive disorder, recurrent episode ([place]) 08/20/2010    Priority: High  . Hyperlipidemia associated with type 2 diabetes mellitus ([place]) 04/11/2009    Priority: High  . Poorly controlled type 2 diabetes mellitus ([place]) 03/08/2009    Priority: High  . Environmental and seasonal allergies 11/04/2017  . Psychophysiological insomnia 11/04/2017  . Vitamin D deficiency 08/26/2017  . Abnormal weight gain 09/03/2016  . Depression 09/03/2016  . Overweight (BMI 25.0-29.9) 09/03/2016  . Witnessed episode of apnea 09/03/2016  . Urinary frequency 04/03/2016  . 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 PCOS (polycystic ovarian syndrome) 05/27/2012  . 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 GERD 03/08/2009     Past Medical History:  Diagnosis Date  . Diabetes mellitus without mention of complication   . Esophageal reflux   . Headache(784.0)   . PCOS (polycystic ovarian syndrome) 05/27/2012  . Pure hypercholesterolemia   . Rosacea      Past Surgical History:  Procedure Laterality Date  .  APPENDECTOMY    . CESAREAN SECTION     triplets and singles  . TUBAL LIGATION       Family History  Problem Relation Age of Onset  . Cancer Mother        breast  . Heart disease Mother   . Stroke Mother   . Diabetes Mother   . Diabetes Father   . Cancer Sister        breast     Social History   Substance and Sexual Activity  Drug Use No  ,  Social History   Substance and Sexual Activity  Alcohol Use No   Comment: occ  ,  Social History   Tobacco Use  Smoking Status Former Smoker  . Packs/day: 1.00  . Years: 15.00  .  Pack years: 15.00  . Types: Cigarettes  . Last attempt to quit: 03/04/2004  . Years since quitting: 13.6  Smokeless Tobacco Never Used  ,    Current Outpatient Medications on File Prior to Visit  Medication Sig Dispense Refill  . ALPRAZolam (XANAX) 0.5 MG tablet Take 0.5 tablets (0.25 mg total) by mouth as needed (only for panic attacks). 30 tablet 0  . buPROPion (WELLBUTRIN XL) 300 MG 24 hr tablet Take 1 tablet (300 mg total) by mouth daily. 90 tablet 1  . fluticasone (FLONASE) 50 MCG/ACT nasal spray 1 spray each nostril after sinus rinse twice daily 16 g 2  . glipiZIDE (GLUCOTROL XL) 10 MG 24 hr tablet Take 1 tablet (10 mg total) by mouth daily with breakfast. 90 tablet 1  . metFORMIN (GLUCOPHAGE-XR) 500 MG 24 hr tablet Take 4 tablets (2,000 mg total) by mouth daily with breakfast. 360 tablet 1  . Vitamin D, Ergocalciferol, (DRISDOL) 50000 units CAPS capsule Please take 1 tablet Wednesday and 1 tablet Sunday every week 24 capsule 3  . zolpidem (AMBIEN) 10 MG tablet Take 1 tablet (10 mg total) by mouth at bedtime as needed for sleep. 90 tablet 0   No current facility-administered medications on file prior to visit.      Allergies  Allergen Reactions  . Lantus [Insulin Glargine] Other (See Comments)    headaches     Review of Systems:   General:  Denies fever, chills Optho/Auditory:   Denies visual changes, blurred vision Respiratory:    Denies SOB, cough, wheeze, DIB  Cardiovascular:   Denies chest pain, palpitations, painful respirations Gastrointestinal:   Denies nausea, vomiting, diarrhea.  Endocrine:     Denies new hot or cold intolerance Musculoskeletal:  Denies joint swelling, gait issues, or new unexplained myalgias/ arthralgias Skin:  Denies rash, suspicious lesions  Neurological:    Denies dizziness, unexplained weakness, numbness  Psychiatric/Behavioral:   Denies mood changes    Objective:     Blood pressure 116/79, pulse 89, height 5\' 6"  (1.676 m), weight 181 lb 14.4 oz (82.5 kg), SpO2 98 %.  Body mass index is 29.36 kg/m.  General: Well Developed, well nourished, and in no acute distress.  HEENT: Normocephalic, atraumatic, pupils equal round reactive to light, neck supple, No carotid bruits, no JVD Skin: Warm and dry, cap RF less 2 sec Cardiac: Regular rate and rhythm, S1, S2 WNL's, no murmurs rubs or gallops Respiratory: ECTA B/L, Not using accessory muscles, speaking in full sentences. NeuroM-Sk: Ambulates w/o assistance, moves ext * 4 w/o difficulty, sensation grossly intact.  Ext: scant edema b/l lower ext Psych: No HI/SI, judgement and insight good, Euthymic mood. Full Affect.

## 2017-11-06 ENCOUNTER — Encounter: Payer: Self-pay | Admitting: Family Medicine

## 2017-11-19 ENCOUNTER — Encounter: Payer: Self-pay | Admitting: Family Medicine

## 2017-11-19 ENCOUNTER — Ambulatory Visit (HOSPITAL_COMMUNITY)
Admission: RE | Admit: 2017-11-19 | Discharge: 2017-11-19 | Disposition: A | Discharge status: Home / Self Care | Payer: BLUE CROSS/BLUE SHIELD | Source: Ambulatory Visit | Attending: Family Medicine | Admitting: Family Medicine

## 2017-11-19 ENCOUNTER — Ambulatory Visit: Payer: BLUE CROSS/BLUE SHIELD | Admitting: Family Medicine

## 2017-11-19 VITALS — BP 114/82 | HR 90 | Ht 66.0 in | Wt 174.2 lb

## 2017-11-19 DIAGNOSIS — R1031 Right lower quadrant pain: Secondary | ICD-10-CM | POA: Diagnosis not present

## 2017-11-19 DIAGNOSIS — R197 Diarrhea, unspecified: Secondary | ICD-10-CM

## 2017-11-19 DIAGNOSIS — Z9049 Acquired absence of other specified parts of digestive tract: Secondary | ICD-10-CM | POA: Diagnosis not present

## 2017-11-19 DIAGNOSIS — N838 Other noninflammatory disorders of ovary, fallopian tube and broad ligament: Secondary | ICD-10-CM

## 2017-11-19 DIAGNOSIS — R110 Nausea: Secondary | ICD-10-CM | POA: Diagnosis not present

## 2017-11-19 DIAGNOSIS — E282 Polycystic ovarian syndrome: Secondary | ICD-10-CM | POA: Diagnosis not present

## 2017-11-19 DIAGNOSIS — E1165 Type 2 diabetes mellitus with hyperglycemia: Secondary | ICD-10-CM

## 2017-11-19 DIAGNOSIS — K8020 Calculus of gallbladder without cholecystitis without obstruction: Secondary | ICD-10-CM | POA: Diagnosis not present

## 2017-11-19 HISTORY — DX: Acquired absence of other specified parts of digestive tract: Z90.49

## 2017-11-19 LAB — POCT URINALYSIS DIPSTICK
BILIRUBIN UA: NEGATIVE
GLUCOSE UA: NEGATIVE
Ketones, UA: NEGATIVE
Leukocytes, UA: NEGATIVE
Nitrite, UA: NEGATIVE
Protein, UA: NEGATIVE
RBC UA: NEGATIVE
Urobilinogen, UA: 0.2 E.U./dL
pH, UA: 5.5 (ref 5.0–8.0)

## 2017-11-19 MED ORDER — ONDANSETRON 8 MG PO TBDP: 8.0000 mg | ORAL_TABLET | Freq: Three times a day (TID) | ORAL | 3 refills | Status: DC | PRN

## 2017-11-19 NOTE — Patient Instructions (Signed)
Melissa after patient gets labs done today, please talk with Dorothea Ogle about scheduling the abdominal ultrasound for her today. -Also referral for GYN was placed  Bland Diet A bland diet consists of foods that do not have a lot of fat or fiber. Foods without fat or fiber are easier for the body to digest. They are also less likely to irritate your mouth, throat, stomach, and other parts of your gastrointestinal tract. A bland diet is sometimes called a BRAT diet. What is my plan? Your health care provider or dietitian may recommend specific changes to your diet to prevent and treat your symptoms, such as:  Eating small meals often.  Cooking food until it is soft enough to chew easily.  Chewing your food well.  Drinking fluids slowly.  Not eating foods that are very spicy, sour, or fatty.  Not eating citrus fruits, such as oranges and grapefruit.  What do I need to know about this diet?  Eat a variety of foods from the bland diet food list.  Do not follow a bland diet longer than you have to.  Ask your health care provider whether you should take vitamins. What foods can I eat? Grains  Hot cereals, such as cream of wheat. Bread, crackers, or tortillas made from refined white flour. Rice. Vegetables Canned or cooked vegetables. Mashed or boiled potatoes. Fruits Bananas. Applesauce. Other types of cooked or canned fruit with the skin and seeds removed, such as canned peaches or pears. Meats and Other Protein Sources Scrambled eggs. Creamy peanut butter or other nut butters. Lean, well-cooked meats, such as chicken or fish. Tofu. Soups or broths. Dairy Low-fat dairy products, such as milk, cottage cheese, or yogurt. Beverages Water. Herbal tea. Apple juice. Sweets and Desserts Pudding. Custard. Fruit gelatin. Ice cream. Fats and Oils Mild salad dressings. Canola or olive oil. The items listed above may not be a complete list of allowed foods or beverages. Contact your dietitian  for more options. What foods are not recommended? Foods and ingredients that are often not recommended include:  Spicy foods, such as hot sauce or salsa.  Fried foods.  Sour foods, such as pickled or fermented foods.  Raw vegetables or fruits, especially citrus or berries.  Caffeinated drinks.  Alcohol.  Strongly flavored seasonings or condiments.  The items listed above may not be a complete list of foods and beverages that are not allowed. Contact your dietitian for more information. This information is not intended to replace advice given to you by your health care provider. Make sure you discuss any questions you have with your health care provider. Document Released: 10/09/2015 Document Revised: 11/23/2015 Document Reviewed: 06/29/2014 Elsevier Interactive Patient Education  2018 [place] Choices to Help Relieve Diarrhea, Adult When you have diarrhea, the foods you eat and your eating habits are very important. Choosing the right foods and drinks can help:  Relieve diarrhea.  Replace lost fluids and nutrients.  Prevent dehydration.  What general guidelines should I follow? Relieving diarrhea  Choose foods with less than 2 g or .07 oz. of fiber per serving.  Limit fats to less than 8 tsp (38 g or 1.34 oz.) a day.  Avoid the following: ? Foods and beverages sweetened with high-fructose corn syrup, honey, or sugar alcohols such as xylitol, sorbitol, and mannitol. ? Foods that contain a lot of fat or sugar. ? Fried, greasy, or spicy foods. ? High-fiber grains, breads, and cereals. ? Raw fruits and vegetables.  Eat foods  that are rich in probiotics. These foods include dairy products such as yogurt and fermented milk products. They help increase healthy bacteria in the stomach and intestines (gastrointestinal tract, or GI tract).  If you have lactose intolerance, avoid dairy products. These may make your diarrhea worse.  Take medicine to help stop  diarrhea (antidiarrheal medicine) only as told by your health care provider. Replacing nutrients  Eat small meals or snacks every 3-4 hours.  Eat bland foods, such as white rice, toast, or baked potato, until your diarrhea starts to get better. Gradually reintroduce nutrient-rich foods as tolerated or as told by your health care provider. This includes: ? Well-cooked protein foods. ? Peeled, seeded, and soft-cooked fruits and vegetables. ? Low-fat dairy products.  Take vitamin and mineral supplements as told by your health care provider. Preventing dehydration   Start by sipping water or a special solution to prevent dehydration (oral rehydration solution, ORS). Urine that is clear or pale yellow means that you are getting enough fluid.  Try to drink at least 8-10 cups of fluid each day to help replace lost fluids.  You may add other liquids in addition to water, such as clear juice or decaffeinated sports drinks, as tolerated or as told by your health care provider.  Avoid drinks with caffeine, such as coffee, tea, or soft drinks.  Avoid alcohol. What foods are recommended? The items listed may not be a complete list. Talk with your health care provider about what dietary choices are best for you. Grains White rice. White, [place], or pita breads (fresh or toasted), including plain rolls, buns, or bagels. White pasta. Saltine, soda, or graham crackers. Pretzels. Low-fiber cereal. Cooked cereals made with water (such as cornmeal, farina, or cream cereals). Plain muffins. Matzo. Melba toast. [place]back. Vegetables Potatoes (without the skin). Most well-cooked and canned vegetables without skins or seeds. Tender lettuce. Fruits Apple sauce. Fruits canned in juice. Cooked apricots, cherries, grapefruit, peaches, pears, or plums. Fresh bananas and cantaloupe. Meats and other protein foods Baked or boiled chicken. Eggs. Tofu. Fish. Seafood. Smooth nut butters. Ground or well-cooked tender  beef, ham, veal, lamb, pork, or poultry. Dairy Plain yogurt, kefir, and unsweetened liquid yogurt. Lactose-free milk, buttermilk, skim milk, or soy milk. Low-fat or nonfat hard cheese. Beverages Water. Low-calorie sports drinks. Fruit juices without pulp. Strained tomato and vegetable juices. Decaffeinated teas. Sugar-free beverages not sweetened with sugar alcohols. Oral rehydration solutions, if approved by your health care provider. Seasoning and other foods Bouillon, broth, or soups made from recommended foods. What foods are not recommended? The items listed may not be a complete list. Talk with your health care provider about what dietary choices are best for you. Grains Whole grain, whole wheat, bran, or rye breads, rolls, pastas, and crackers. Wild or brown rice. Whole grain or bran cereals. Barley. Oats and oatmeal. Corn tortillas or taco shells. Granola. Popcorn. Vegetables Raw vegetables. Fried vegetables. Cabbage, broccoli, Brussels sprouts, artichokes, baked beans, beet greens, corn, kale, legumes, peas, sweet potatoes, and yams. Potato skins. Cooked spinach and cabbage. Fruits Dried fruit, including raisins and dates. Raw fruits. Stewed or dried prunes. Canned fruits with syrup. Meat and other protein foods Fried or fatty meats. Deli meats. Chunky nut butters. Nuts and seeds. Beans and lentils. Berniece Salines. Hot dogs. Sausage. Dairy High-fat cheeses. Whole milk, chocolate milk, and beverages made with milk, such as milk shakes. Half-and-half. Cream. sour cream. Ice cream. Beverages Caffeinated beverages (such as coffee, tea, soda, or energy drinks). Alcoholic beverages. Fruit  juices with pulp. Prune juice. Soft drinks sweetened with high-fructose corn syrup or sugar alcohols. High-calorie sports drinks. Fats and oils Butter. Cream sauces. Margarine. Salad oils. Plain salad dressings. Olives. Avocados. Mayonnaise. Sweets and desserts Sweet rolls, doughnuts, and sweet breads. Sugar-free  desserts sweetened with sugar alcohols such as xylitol and sorbitol. Seasoning and other foods Honey. Hot sauce. Chili powder. Gravy. Cream-based or milk-based soups. Pancakes and waffles. Summary  When you have diarrhea, the foods you eat and your eating habits are very important.  Make sure you get at least 8-10 cups of fluid each day, or enough to keep your urine clear or pale yellow.  Eat bland foods and gradually reintroduce healthy, nutrient-rich foods as tolerated, or as told by your health care provider.  Avoid high-fiber, fried, greasy, or spicy foods. This information is not intended to replace advice given to you by your health care provider. Make sure you discuss any questions you have with your health care provider. Document Released: 09/07/2003 Document Revised: 06/14/2016 Document Reviewed: 06/14/2016 Elsevier Interactive Patient Education  [employer].

## 2017-11-19 NOTE — Progress Notes (Signed)
Pt here for an acute care OV today  Impression and Recommendations:    1. Right lower quadrant abdominal pain   2. Nausea   3. recent h/o Diarrhea, - now constipated   4. Poorly controlled type 2 diabetes mellitus ([place])   5. Status post appendectomy   6. PCOS (polycystic ovarian syndrome)   7. h/o Enlarged ovaries     1. Acute Right Lower Quadrant GI Pain - Patient is status post-appendectomy.  Labs drawn today to check enzymes and blood counts and evaluate possibility of other infection.  - Recommended abdominal ultrasound today.  Patient advised at length about the benefits of abdominal ultrasound for rule-out reasons.  - Ambulatory referral to GYN placed today for further assessment.  - Patient has a history of diarrhea, now constipated.  Zofran prescribed today, and patient will begin Miralax to mitigate constipation.  - Follow BRAT diet; nothing fried, nothing spicy, no dairy.  Recommended jello, chicken broth, plan noodles, plain rice, plain mashed potatoes without butter or toppings, etc.  - Patent advised to use 0.25 injection of Ozempic this week, waiting until tests are back before injection.  Otherwise, advised patient to continue eating as healthfully as possible, specifically noting that while on Ozempic, she needs to make sure she isn't eating poorly.   Meds ordered this encounter  Medications  . ondansetron (ZOFRAN-ODT) 8 MG disintegrating tablet    Sig: Take 1 tablet (8 mg total) by mouth every 8 (eight) hours as needed for nausea.    Dispense:  20 tablet    Refill:  3    Orders Placed This Encounter  Procedures  . US Abdomen Complete  . CBC with Differential/Platelet  . Comprehensive metabolic panel  . Gamma GT  . Magnesium  . Phosphorus  . Bilirubin, fractionated(tot/dir/indir)  . Lactate dehydrogenase  . Lipase  . Ambulatory referral to Gynecology  . POCT urinalysis dipstick     Education and routine counseling performed. Handouts  provided  Gross side effects, risk and benefits, and alternatives of medications and treatment plan in general discussed with patient.  Patient is aware that all medications have potential side effects and we are unable to predict every side effect or drug-drug interaction that may occur.   Patient will call with any questions prior to using medication if they have concerns.  Expresses verbal understanding and consents to current therapy and treatment regimen.  No barriers to understanding were identified.  Red flag symptoms and signs discussed in detail.  Patient expressed understanding regarding what to do in case of emergency\urgent symptoms   Please see AVS handed out to patient at the end of our visit for further patient instructions/ counseling done pertaining to today's office visit.  Return if symptoms worsen or fail to improve, for F-up of current med issues as previously d/c pt.    Note: This document was prepared occasionally using Dragon voice recognition software and may include unintentional dictation errors in addition to a scribe.  This document serves as a record of services personally performed by Mellody Dance, DO. It was created on her behalf by Toni Amend, a trained medical scribe. The creation of this record is based on the scribe's personal observations and the provider's statements to them.   I have reviewed the above medical documentation for accuracy and completeness and I concur.  Mellody Dance 11/19/17 12:14 PM   --------------------------------------------------------------------------------------------------------------------------------------------------   Subjective:    CC:  Chief Complaint  Patient presents with  .  Nausea    HPI: Lindsey Mason is a 46 y.o. female who presents to [facility] today for issues as discussed below.  Patient notes that she had a right-sided ovarian cyst that "measured nine" last  year in 2018.  She was recommended to have surgery, but lost her insurance, and couldn't follow up as recommended.  Got her insurance back on January first of this year (2019) and hasn't returned to GYN for evaluation.  Current GI Symptoms Notes that she feels better today than she did two days ago, but still symptomatic.  Patient has been nauseated since Thursday (6 days ago).  Notes "I haven't eaten well, because I just can't even think about it."  Notes that she has feelings of bloating and fullness.  Noted a little fever on Thursday, of about 100.  Went on Friday to Urgent Care and was placed on Augmentin for a sinus infection, which cleared up right away.  Patient wonders if the Augmentin could be exacerbating her current nausea.  Was having diarrhea from Thursday to Sunday, which ended on Sunday.  Has not had a bowel movement since Sunday (3 days ago).  Denies exposure to anyone else in the family with similar symptoms.  Patient started Ozempic two weeks ago, and wondered if that was causing her nausea.  However, she specifically notes that she had the bloating/cramping prior to starting Ozempic.  Has lost 8 lbs in the past 2 weeks.  Has been drinking plenty of water, but has not been drinking any calories.  Patient has been taking some of her daughter's Zofran for relief of her symptoms.    Problem  Status Post Appendectomy     Wt Readings from Last 3 Encounters:  11/19/17 174 lb 3.2 oz (79 kg)  11/04/17 181 lb 14.4 oz (82.5 kg)  10/20/17 177 lb (80.3 kg)   BP Readings from Last 3 Encounters:  11/19/17 114/82  11/04/17 116/79  10/20/17 119/72   BMI Readings from Last 3 Encounters:  11/19/17 28.12 kg/m  11/04/17 29.36 kg/m  10/20/17 28.57 kg/m     Patient Care Team    Relationship Specialty Notifications Start End  Mellody Dance, DO PCP - General Family Medicine  08/14/17   Marin Comment, [place], [place] Referring Physician Optometry  08/04/17      Patient Active Problem  List   Diagnosis Date Noted  . Mixed diabetic hyperlipidemia associated with type 2 diabetes mellitus ([place]) 08/26/2017    Priority: High  . Uncontrolled type 2 diabetes mellitus with hyperglycemia, without long-term current use of insulin ([place]) 09/03/2016    Priority: High  . Major depressive disorder, recurrent episode ([place]) 08/20/2010    Priority: High  . Hyperlipidemia associated with type 2 diabetes mellitus ([place]) 04/11/2009    Priority: High  . Poorly controlled type 2 diabetes mellitus ([place]) 03/08/2009    Priority: High  . Status post appendectomy 11/19/2017  . Environmental and seasonal allergies 11/04/2017  . Psychophysiological insomnia 11/04/2017  . Vitamin D deficiency 08/26/2017  . Abnormal weight gain 09/03/2016  . Depression 09/03/2016  . Overweight (BMI 25.0-29.9) 09/03/2016  . Witnessed episode of apnea 09/03/2016  . Urinary frequency 04/03/2016  . 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 PCOS (polycystic ovarian syndrome) 05/27/2012  . 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 GERD 03/08/2009    Past Medical history, Surgical history, Family history, Social history, Allergies and Medications have been entered into the medical  record, reviewed and changed as needed.    Current Meds  Medication Sig  . ALPRAZolam (XANAX) 0.5 MG tablet Take 0.5 tablets (0.25 mg total) by mouth as needed (only for panic attacks).  [name] amoxicillin-clavulanate (AUGMENTIN) 875-125 MG tablet Take 1 tablet by mouth 2 (two) times daily.  [name] buPROPion (WELLBUTRIN XL) 300 MG 24 hr tablet Take 1 tablet (300 mg total) by mouth daily.  . fluticasone (FLONASE) 50 MCG/ACT nasal spray 1 spray each nostril after sinus rinse twice daily  . glipiZIDE (GLUCOTROL XL) 10 MG 24 hr tablet Take 1 tablet (10 mg total) by mouth daily with breakfast.  . metFORMIN (GLUCOPHAGE-XR) 500 MG 24 hr tablet Take 4 tablets (2,000 mg total) by mouth daily with breakfast.  . ondansetron  (ZOFRAN) 4 MG tablet Take 4 mg by mouth daily.  . Semaglutide (OZEMPIC) 1 MG/DOSE SOPN Inject 1 mg into the skin once a week.  . Vitamin D, Ergocalciferol, (DRISDOL) 50000 units CAPS capsule Please take 1 tablet Wednesday and 1 tablet Sunday every week  . zolpidem (AMBIEN) 10 MG tablet Take 1 tablet (10 mg total) by mouth at bedtime as needed for sleep.    Allergies:  Allergies  Allergen Reactions  . Lantus [Insulin Glargine] Other (See Comments)    headaches     Review of Systems: General:   Denies fever, chills, unexplained weight loss.  Optho/Auditory:   Denies visual changes, blurred vision/LOV Respiratory:   Denies wheeze, DOE more than baseline levels.  Cardiovascular:   Denies chest pain, palpitations, new onset peripheral edema  Gastrointestinal:   Denies nausea, vomiting, diarrhea, abd pain.  Genitourinary: Denies dysuria, freq/ urgency, flank pain or discharge from genitals.  Endocrine:     Denies hot or cold intolerance, polyuria, polydipsia. Musculoskeletal:   Denies unexplained myalgias, joint swelling, unexplained arthralgias, gait problems.  Skin:  Denies new onset rash, suspicious lesions Neurological:     Denies dizziness, unexplained weakness, numbness  Psychiatric/Behavioral:   Denies mood changes, suicidal or homicidal ideations, hallucinations    Objective:   Blood pressure 114/82, pulse 90, height 5\' 6"  (1.676 m), weight 174 lb 3.2 oz (79 kg), SpO2 98 %. Body mass index is 28.12 kg/m. General:  Well Developed, well nourished, appropriate for stated age.  Neuro:  Alert and oriented,  extra-ocular muscles intact  HEENT:  Normocephalic, atraumatic, neck supple Skin:  no gross rash, warm, pink. Cardiac:  RRR, S1 S2 Respiratory:  ECTA B/L and A/P, Not using accessory muscles, speaking in full sentences- unlabored. Vascular:  Ext warm, no cyanosis apprec.; cap RF less 2 sec. Psych:  No HI/SI, judgement and insight good, Euthymic mood. Full Affect. Abdominal  Exam: Tenderness to the RLQ upon deep palpation. No rebound, rigidity, guarding.  Positive bowel sounds, but hypoactive. NO fluid shift, bloating

## 2017-11-20 DIAGNOSIS — N838 Other noninflammatory disorders of ovary, fallopian tube and broad ligament: Secondary | ICD-10-CM | POA: Insufficient documentation

## 2017-11-20 LAB — COMPREHENSIVE METABOLIC PANEL
A/G RATIO: 1.8 (ref 1.2–2.2)
ALK PHOS: 72 IU/L (ref 39–117)
ALT: 12 IU/L (ref 0–32)
AST: 8 IU/L (ref 0–40)
Albumin: 4.6 g/dL (ref 3.5–5.5)
BILIRUBIN TOTAL: 0.5 mg/dL (ref 0.0–1.2)
BUN / CREAT RATIO: 24 — AB (ref 9–23)
BUN: 13 mg/dL (ref 6–24)
CHLORIDE: 104 mmol/L (ref 96–106)
CO2: 19 mmol/L — ABNORMAL LOW (ref 20–29)
Calcium: 9.6 mg/dL (ref 8.7–10.2)
Creatinine, Ser: 0.54 mg/dL — ABNORMAL LOW (ref 0.57–1.00)
GFR calc non Af Amer: 113 mL/min/{1.73_m2} (ref >59)
GFR, EST AFRICAN AMERICAN: 130 mL/min/{1.73_m2} (ref >59)
GLUCOSE: 110 mg/dL — AB (ref 65–99)
Globulin, Total: 2.5 g/dL (ref 1.5–4.5)
POTASSIUM: 4.5 mmol/L (ref 3.5–5.2)
Sodium: 140 mmol/L (ref 134–144)
TOTAL PROTEIN: 7.1 g/dL (ref 6.0–8.5)

## 2017-11-20 LAB — CBC WITH DIFFERENTIAL/PLATELET
BASOS ABS: 0.1 10*3/uL (ref 0.0–0.2)
BASOS: 1 %
EOS (ABSOLUTE): 0.1 10*3/uL (ref 0.0–0.4)
Eos: 1 %
Hematocrit: 42.8 % (ref 34.0–46.6)
Hemoglobin: 14.8 g/dL (ref 11.1–15.9)
Immature Grans (Abs): 0.1 10*3/uL (ref 0.0–0.1)
Immature Granulocytes: 1 %
LYMPHS ABS: 2.4 10*3/uL (ref 0.7–3.1)
Lymphs: 22 %
MCH: 29.2 pg (ref 26.6–33.0)
MCHC: 34.6 g/dL (ref 31.5–35.7)
MCV: 84 fL (ref 79–97)
Monocytes Absolute: 0.5 10*3/uL (ref 0.1–0.9)
Monocytes: 5 %
NEUTROS ABS: 7.6 10*3/uL — AB (ref 1.4–7.0)
Neutrophils: 70 %
PLATELETS: 300 10*3/uL (ref 150–450)
RBC: 5.07 x10E6/uL (ref 3.77–5.28)
RDW: 13.6 % (ref 12.3–15.4)
WBC: 10.7 10*3/uL (ref 3.4–10.8)

## 2017-11-20 LAB — BILIRUBIN, FRACTIONATED(TOT/DIR/INDIR)
BILIRUBIN INDIRECT: 0.4 mg/dL (ref 0.10–0.80)
BILIRUBIN TOTAL: 0.5 mg/dL (ref 0.0–1.2)
BILIRUBIN, DIRECT: 0.1 mg/dL (ref 0.00–0.40)

## 2017-11-20 LAB — PHOSPHORUS: PHOSPHORUS: 4 mg/dL (ref 2.5–4.5)

## 2017-11-20 LAB — LACTATE DEHYDROGENASE: LDH: 242 IU/L — ABNORMAL HIGH (ref 119–226)

## 2017-11-20 LAB — GAMMA GT: GGT: 15 IU/L (ref 0–60)

## 2017-11-20 LAB — MAGNESIUM: Magnesium: 1.8 mg/dL (ref 1.6–2.3)

## 2017-11-20 LAB — LIPASE: LIPASE: 40 U/L (ref 14–72)

## 2017-11-21 ENCOUNTER — Telehealth: Payer: Self-pay | Admitting: Family Medicine

## 2017-11-21 NOTE — Telephone Encounter (Signed)
Patient called and is requesting "the other ultrasound" that was discussed with Dr. Jenetta Downer. Please advise and place any necessary referral.

## 2017-11-25 ENCOUNTER — Other Ambulatory Visit: Payer: Self-pay

## 2017-11-25 ENCOUNTER — Telehealth: Payer: Self-pay | Admitting: Obstetrics and Gynecology

## 2017-11-25 DIAGNOSIS — E282 Polycystic ovarian syndrome: Secondary | ICD-10-CM

## 2017-11-25 DIAGNOSIS — R1031 Right lower quadrant pain: Secondary | ICD-10-CM

## 2017-11-25 DIAGNOSIS — N838 Other noninflammatory disorders of ovary, fallopian tube and broad ligament: Secondary | ICD-10-CM

## 2017-11-25 NOTE — Progress Notes (Signed)
Orders for US pelvis complete per Dr. Hershal Coria pervious message in the chart. MPulliam, CMA/RT(R)

## 2017-11-25 NOTE — Telephone Encounter (Signed)
Called but could not leave a message for patient to call back to schedule a new patient doctor referral appointment with our office with Dr. Sabra Heck or Dr. Talbert Nan for PCOS, her voice mail is full.

## 2017-11-25 NOTE — Telephone Encounter (Signed)
Order was placed for Ultrasound and Dorothea Ogle aware so appointment can be set up. MPulliam, CMA/RT(R)

## 2017-11-25 NOTE — Progress Notes (Signed)
Changed order for US pelvis complete to include transvaginal. MPulliam, CMA/RT(R)

## 2017-11-27 ENCOUNTER — Ambulatory Visit (HOSPITAL_COMMUNITY)
Admission: RE | Admit: 2017-11-27 | Discharge: 2017-11-27 | Disposition: A | Discharge status: Home / Self Care | Payer: BLUE CROSS/BLUE SHIELD | Source: Ambulatory Visit | Attending: Family Medicine | Admitting: Family Medicine

## 2017-11-27 ENCOUNTER — Other Ambulatory Visit: Payer: Self-pay | Admitting: Family Medicine

## 2017-11-27 ENCOUNTER — Encounter (HOSPITAL_COMMUNITY): Payer: Self-pay

## 2017-11-27 DIAGNOSIS — N838 Other noninflammatory disorders of ovary, fallopian tube and broad ligament: Secondary | ICD-10-CM | POA: Insufficient documentation

## 2017-11-27 DIAGNOSIS — E282 Polycystic ovarian syndrome: Secondary | ICD-10-CM | POA: Diagnosis not present

## 2017-11-27 DIAGNOSIS — R1031 Right lower quadrant pain: Secondary | ICD-10-CM

## 2017-11-27 NOTE — Telephone Encounter (Signed)
Called and left a message for patient to call back to schedule a new patient doctor referral appointment with our office to see Dr. Sabra Heck or Dr. Talbert Nan for PCOS.

## 2017-12-01 LAB — HM DIABETES EYE EXAM

## 2017-12-09 ENCOUNTER — Ambulatory Visit: Payer: BLUE CROSS/BLUE SHIELD | Admitting: Family Medicine

## 2017-12-09 ENCOUNTER — Encounter: Payer: Self-pay | Admitting: Family Medicine

## 2017-12-09 VITALS — BP 110/77 | HR 99 | Ht 66.0 in | Wt 173.8 lb

## 2017-12-09 DIAGNOSIS — R110 Nausea: Secondary | ICD-10-CM

## 2017-12-09 DIAGNOSIS — N83201 Unspecified ovarian cyst, right side: Secondary | ICD-10-CM

## 2017-12-09 DIAGNOSIS — E663 Overweight: Secondary | ICD-10-CM

## 2017-12-09 DIAGNOSIS — K30 Functional dyspepsia: Secondary | ICD-10-CM | POA: Diagnosis not present

## 2017-12-09 DIAGNOSIS — R197 Diarrhea, unspecified: Secondary | ICD-10-CM

## 2017-12-09 DIAGNOSIS — E1165 Type 2 diabetes mellitus with hyperglycemia: Secondary | ICD-10-CM

## 2017-12-09 DIAGNOSIS — R1031 Right lower quadrant pain: Secondary | ICD-10-CM

## 2017-12-09 DIAGNOSIS — R142 Eructation: Secondary | ICD-10-CM

## 2017-12-09 MED ORDER — RANITIDINE HCL 300 MG PO TABS: ORAL_TABLET | ORAL | 3 refills | Status: DC

## 2017-12-09 MED ORDER — OMEPRAZOLE 20 MG PO CPDR: DELAYED_RELEASE_CAPSULE | ORAL | 1 refills | Status: DC

## 2017-12-09 NOTE — Patient Instructions (Addendum)
Please keep a food journal -Check blood sugars on regular basis, write down and bring in log next office visit. -We should see you around 02/04/2018 for a 31-month follow-up of your A1c to go over your blood sugars etc. -Please follow-up sooner than planned if any acute problems or concerns or questions.    Gastroparesis Gastroparesis, also called delayed gastric emptying, is a condition in which food takes longer than normal to empty from the stomach. The condition is usually long-lasting (chronic). What are the causes? This condition may be caused by:  An endocrine disorder, such as hypothyroidism or diabetes. Diabetes is the most common cause of this condition.  A nervous system disease, such as Parkinson disease or multiple sclerosis.  Cancer, infection, or surgery of the stomach or vagus nerve.  A connective tissue disorder, such as scleroderma.  Certain medicines.  In most cases, the cause is not known. What increases the risk? This condition is more likely to develop in:  People with certain disorders, including endocrine disorders, eating disorders, amyloidosis, and scleroderma.  People with certain diseases, including Parkinson disease or multiple sclerosis.  People with cancer or infection of the stomach or vagus nerve.  People who have had surgery on the stomach or vagus nerve.  People who take certain medicines.  Women.  What are the signs or symptoms? Symptoms of this condition include:  An early feeling of fullness when eating.  Nausea.  Weight loss.  Vomiting.  Heartburn.  Abdominal bloating.  Inconsistent blood glucose levels.  Lack of appetite.  Acid from the stomach coming up into the esophagus (gastroesophageal reflux).  Spasms of the stomach.  Symptoms may come and go. How is this diagnosed? This condition is diagnosed with tests, such as:  Tests that check how long it takes food to move through the stomach and intestines. These tests  include: ? Upper gastrointestinal (GI) series. In this test, X-rays of the intestines are taken after you drink a liquid. The liquid makes the intestines show up better on the X-rays. ? Gastric emptying scintigraphy. In this test, scans are taken after you eat food that contains a small amount of radioactive material. ? Wireless capsule GI monitoring system. This test involves swallowing a capsule that records information about movement through the stomach.  Gastric manometry. This test measures electrical and muscular activity in the stomach. It is done with a thin tube that is passed down the throat and into the stomach.  Endoscopy. This test checks for abnormalities in the lining of the stomach. It is done with a long, thin tube that is passed down the throat and into the stomach.  An ultrasound. This test can help rule out gallbladder disease or pancreatitis as a cause of your symptoms. It uses sound waves to take pictures of the inside of your body.  How is this treated? There is no cure for gastroparesis. This condition may be managed with:  Treatment of the underlying condition causing the gastroparesis.  Lifestyle changes, including exercise and dietary changes. Dietary changes can include: ? Changes in what and when you eat. ? Eating smaller meals more often. ? Eating low-fat foods. ? Eating low-fiber forms of high-fiber foods, such as cooked vegetables instead of raw vegetables. ? Having liquid foods in place of solid foods. Liquid foods are easier to digest.  Medicines. These may be given to control nausea and vomiting and to stimulate stomach muscles.  Getting food through a feeding tube. This may be done in severe cases.  A gastric neurostimulator. This is a device that is inserted into the body with surgery. It helps improve stomach emptying and control nausea and vomiting.  Follow these instructions at home:  Follow your health care provider's instructions about exercise  and diet.  Take medicines only as directed by your health care provider. Contact a health care provider if:  Your symptoms do not improve with treatment.  You have new symptoms. Get help right away if:  You have severe abdominal pain that does not improve with treatment.  You have nausea that does not go away.  You cannot keep fluids down. This information is not intended to replace advice given to you by your health care provider. Make sure you discuss any questions you have with your health care provider. Document Released: 06/17/2005 Document Revised: 11/23/2015 Document Reviewed: 06/13/2014 Elsevier Interactive Patient Education  2018 [facility] Referrals    Anderson Malta, personal counselor in [place], specializing in marriage counseling    Dr. Tomi Bamberger, PHD Dr. Tomi Bamberger, PHD is a counselor in [place], [place].  [address] [place], [place] [address] [place] [phone]   Kristie Cowman, [place]  [phone] [email or web address] ( she does [place] and faith-based coaching and counseling )    [employer]- ( faith-based counseling ) Address: [place] [address] [place], [place] [address] [phone] [address] for appointments [phone] Fax Hours: Monday - Thursday 8:00am-6:00pm Closed for lunch 12-1Thursday only Friday: Closed all day   Steffanie Rainwater: [phone] or Meg Martinique- [phone] -counselors in [place] who are faith based    [facility] Nunzio Cobbs, [place], ACSW, M.ED.  -Nunzio Cobbs is a licensed clinical social worker in practice over 71 years and with Dr. Chucky May for the last 10 years.  -She sees adults, adolescents, children & families and couples. -Services are provided for mood and anxiety disorders, marital issues, family or parent/child problems, parenting, co-dependency,  gender issues, trauma, grief, and stages of life issues. She also provides critical incident stress debriefing.  -Pamala Hurry accepts many employee assistance programs (EAP), [employer], Pharmacist, hospital.  PHONE  [phone]                FAX [phone]   [email or web address] [employer]- gen counseling;  PHD   Wilber Oliphant, MSW [address] [place]   [facility] Apolonio Schneiders, PhD [address] [place] [phone]   [place] [facility] Psychological- children [address] [place], [place] [phone]   Lanelle Bal Professional Counselor Counseling and [employer] Thomas abuse [facility] [place] Manager [address], [place] [phone] [place] [address], [place] Delmer Islam, PhD Oneida Arenas, PhD Leitha Bleak, LCSW Jillene Bucks, PhD-child, adolescent and adults   [facility] [address], [place] [phone] Merilyn Baba, MS-child, adolescent and adults Lennart Pall, PhD-adolescent and adults   [facility]-grief, terminal illness [place], [place] [address], [employer] [address], [place] [phone]   [facility] Jansen, [place]   [facility] [address], [place], [place]   [facility] [address] [place] [phone]  [facility] [address], [place]   [facility] [address], [place] [address],  [place]

## 2017-12-09 NOTE — Progress Notes (Signed)
Impression and Recommendations:    1. RLQ abdominal pain   2. Right ovarian cyst-seeing Dr. Sumner Boast   3. Nausea   4. recent h/o Diarrhea, - now constipated   5. Acid indigestion   6. Gaseous regurgitation   7. Uncontrolled type 2 diabetes mellitus with hyperglycemia, without long-term current use of insulin ([place])   8. Overweight (BMI 25.0-29.9)    1. Diabetes - No changes made today.  Continue management and medication as prescribed. - Continue once-weekly Ozempic, glipizide, and metformin.  Patient tolerates meds well.  - Patient's fasting sugars are around 120-125. - Reviewed goal fasting sugars as 120, with A1c of less than 7.  - Patient should avoid bread, pasta, rice, potatoes; simple carbs as much as possible. - Patient will continue with dietary and activity modifications.  - Counseled patient on pathophysiology of disease and discussed various treatment options, which often includes dietary and lifestyle modifications as first line.  Importance of low carb/ketogenic diet discussed with patient in addition to regular exercise.   - Check FBS and 2 hours after the biggest meal of your day.  Keep log and bring in next OV for my review.   Also, if you ever feel poorly, please check your blood pressure and blood sugar, as one or the other could be the cause of your symptoms.  - Will continue to monitor closely.  2. Chronic Nausea/Gas/Bloating - ongoing for 6 weeks - Will increase dose of zantac. - Omeprazole prescribed today for short-term use.  - Recommended pepto bismol in addition PRN.  - If attempt to calm down her symptoms with medication does not work, strongly advised patient that her gas, bloating, and nausea, should be evaluated by GI.  - Referral placed to GI today.  Advised patient to attend appointment if symptoms don't improve.  - Advised patient to pay close attention to what she's eating when symptoms occur, and to monitor her blood sugars when  these symptoms occur.  Keep a journal of foods she's consumed when her symptoms flare.  - Patient with ongoing nausea, gas, bloating, but there is some improvement.  Patient believes she noted some improvement with zantac 75 OTC, noting that this has helped her symptoms of nausea, gas, and bloating.  3. Mood - Continue management as prescribed.  No changes made today.  Will continue to monitor.  - Advised patient to begin regular counseling and therapy.  4. General Health Maintenance  BMI Counseling Explained to patient what BMI refers to, and what it means medically.    Told patient to think about it as a "medical risk stratification measurement" and how increasing BMI is associated with increasing risk/ or worsening state of various diseases such as hypertension, hyperlipidemia, diabetes, premature OA, depression etc.  American Heart Association guidelines for healthy diet, basically Mediterranean diet, and exercise guidelines of 30 minutes 5 days per week or more discussed in detail.  Health counseling performed.  All questions answered.  - Advised patient to continue working toward exercising to improve health.    - Recommended that the patient eventually strive for at least 150 minutes of moderate cardiovascular activity per week according to guidelines established by the [facility].   - Healthy dietary habits encouraged, including low-carb, and high amounts of lean protein in diet.   - Patient should also consume adequate amounts of water - half of body weight in oz of water per day.  5. Follow-Up - Patient will return for follow-up in 6-8  weeks. - Patient will go to GI in 6 weeks if no improvement of symptoms.  - Patient has a gynecological appointment in a week.  Advised patient to focus on evaluating RLQ pain of her ovarian cyst.   Education and routine counseling performed. Handouts provided.   Orders Placed This Encounter  Procedures  . Ambulatory referral to  Gastroenterology    Meds ordered this encounter  Medications  . ranitidine (ZANTAC) 300 MG tablet    Sig: 2 p.o. twice daily for 6 weeks then 1 p.o. twice daily    Dispense:  180 tablet    Refill:  3  . omeprazole (PRILOSEC) 20 MG capsule    Sig: 1 tablet twice daily x6 weeks then 1 p.o. daily    Dispense:  60 capsule    Refill:  1    Return for Around 7-8 weeks for follow-up A1c and recheck.  Sooner if needed..   The patient was counseled, risk factors were discussed, anticipatory guidance given.  Gross side effects, risk and benefits, and alternatives of medications discussed with patient.  Patient is aware that all medications have potential side effects and we are unable to predict every side effect or drug-drug interaction that may occur.  Expresses verbal understanding and consents to current therapy plan and treatment regimen.  Please see AVS handed out to patient at the end of our visit for further patient instructions/ counseling done pertaining to today's office visit.    Note: This document was prepared using Dragon voice recognition software and may include unintentional dictation errors.  I have reviewed the above documentation for accuracy and completeness, and I agree with the above.    This document serves as a record of services personally performed by Mellody Dance, DO. It was created on her behalf by Toni Amend, a trained medical scribe. The creation of this record is based on the scribe's personal observations and the provider's statements to them.   I have reviewed the above medical documentation for accuracy and completeness and I concur.  Mellody Dance 12/10/17 10:05 PM    Subjective:    Chief Complaint  Patient presents with  . Follow-up    Lindsey Mason is a 47 y.o. female who presents to [place] at [facility] today for Diabetes Management.  She is here today to evaluate her progress on Ozempic.  Chronic  Nausea/Gas/Bloating Patient is still experiencing nausea and diarrhea off and on, gas, and bloating.  Notes that these symptoms have been going on for 6 weeks. These symptoms are getting slightly better.  She was formerly not even able to eat.   Now she is able to eat, but notes that she has lost weight since these symptoms started.  Patient would like to lose more weight, but the bloating makes her feel bigger than she actually is.  Patient does not think that the nausea is due to her new medication, Ozempic.  Patient began taking zantac OTC a few days ago. She believes that the zantac might have helped with the nausea/gas/bloating.   Patient is no longer taking Zofran.  Mood Patient's stress has reduced since she is off of work for the summer.  DM HPI: -  She has been working on diet and exercise for diabetes  Patient notes that she's been sick and hasn't felt like eating, which might be helping her sugars.  Pt is currently maintained on the following medications for diabetes:   see med list today Medication compliance -  Patient has been taking her medication faithfully.  Home glucose readings range: Her fasting blood sugars are now 120-125 on a regular basis. Notes that her blood sugar is doing well right now.   Denies polyuria/polydipsia. Denies hypo/ hyperglycemia symptoms - She denies new onset of: chest pain, exercise intolerance, shortness of breath, dizziness, visual changes, headache, lower extremity swelling or claudication.   Last diabetic eye exam was  Lab Results  Component Value Date   HMDIABEYEEXA No Retinopathy 11/08/2013    Foot exam- UTD  Last A1C in the office was:  Lab Results  Component Value Date   HGBA1C 9.6 11/04/2017   HGBA1C >14 08/04/2017   HGBA1C 7.2 (H) 03/14/2016    Lab Results  Component Value Date   MICROALBUR 10 08/04/2017   Hilo 81 08/14/2017   CREATININE 0.54 (L) 11/19/2017      Last 3 blood pressure readings in our  office are as follows: BP Readings from Last 3 Encounters:  12/09/17 110/77  11/19/17 114/82  11/04/17 116/79    BMI Readings from Last 3 Encounters:  12/09/17 28.05 kg/m  11/19/17 28.12 kg/m  11/04/17 29.36 kg/m     No problems updated.    Patient Care Team    Relationship Specialty Notifications Start End  Mellody Dance, DO PCP - General Family Medicine  08/14/17   Marin Comment, My [place], [place] Referring Physician Optometry  08/04/17      Patient Active Problem List   Diagnosis Date Noted  . Mixed diabetic hyperlipidemia associated with type 2 diabetes mellitus ([place]) 08/26/2017    Priority: High  . Uncontrolled type 2 diabetes mellitus with hyperglycemia, without long-term current use of insulin ([place]) 09/03/2016    Priority: High  . Major depressive disorder, recurrent episode ([place]) 08/20/2010    Priority: High  . Hyperlipidemia associated with type 2 diabetes mellitus ([place]) 04/11/2009    Priority: High  . Poorly controlled type 2 diabetes mellitus ([place]) 03/08/2009    Priority: High  . Right ovarian cyst-seeing Dr. Sumner Boast 12/09/2017  . h/o Enlarged ovaries 11/20/2017  . Status post appendectomy 11/19/2017  . Environmental and seasonal allergies 11/04/2017  . Psychophysiological insomnia 11/04/2017  . Vitamin D deficiency 08/26/2017  . Abnormal weight gain 09/03/2016  . Depression 09/03/2016  . Overweight (BMI 25.0-29.9) 09/03/2016  . Witnessed episode of apnea 09/03/2016  . Urinary frequency 04/03/2016  . 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 PCOS (polycystic ovarian syndrome) 05/27/2012  . 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 GERD 03/08/2009     Past Medical History:  Diagnosis Date  . Diabetes mellitus without mention of complication   . Esophageal reflux   . Headache(784.0)   . PCOS (polycystic ovarian syndrome) 05/27/2012  . Pure hypercholesterolemia   . Rosacea      Past Surgical History:  Procedure  Laterality Date  . APPENDECTOMY    . CESAREAN SECTION     triplets and singles  . TUBAL LIGATION       Family History  Problem Relation Age of Onset  . Cancer Mother        breast  . Heart disease Mother   . Stroke Mother   . Diabetes Mother   . Diabetes Father   . Cancer Sister        breast     Social History   Substance and Sexual Activity  Drug Use No  ,  Social History   Substance and Sexual Activity  Alcohol  Use No   Comment: occ  ,  Social History   Tobacco Use  Smoking Status Former Smoker  . Packs/day: 1.00  . Years: 15.00  . Pack years: 15.00  . Types: Cigarettes  . Last attempt to quit: 03/04/2004  . Years since quitting: 13.7  Smokeless Tobacco Never Used  ,    Current Outpatient Medications on File Prior to Visit  Medication Sig Dispense Refill  . ALPRAZolam (XANAX) 0.5 MG tablet Take 0.5 tablets (0.25 mg total) by mouth as needed (only for panic attacks). 30 tablet 0  . amoxicillin-clavulanate (AUGMENTIN) 875-125 MG tablet Take 1 tablet by mouth 2 (two) times daily.  0  . buPROPion (WELLBUTRIN XL) 300 MG 24 hr tablet Take 1 tablet (300 mg total) by mouth daily. 90 tablet 1  . fluticasone (FLONASE) 50 MCG/ACT nasal spray 1 spray each nostril after sinus rinse twice daily 16 g 2  . glipiZIDE (GLUCOTROL XL) 10 MG 24 hr tablet Take 1 tablet (10 mg total) by mouth daily with breakfast. 90 tablet 1  . metFORMIN (GLUCOPHAGE-XR) 500 MG 24 hr tablet Take 4 tablets (2,000 mg total) by mouth daily with breakfast. 360 tablet 1  . ondansetron (ZOFRAN-ODT) 8 MG disintegrating tablet Take 1 tablet (8 mg total) by mouth every 8 (eight) hours as needed for nausea. 20 tablet 3  . Semaglutide (OZEMPIC) 1 MG/DOSE SOPN Inject 1 mg into the skin once a week. 12 pen 3  . Vitamin D, Ergocalciferol, (DRISDOL) 50000 units CAPS capsule Please take 1 tablet Wednesday and 1 tablet Sunday every week 24 capsule 3  . zolpidem (AMBIEN) 10 MG tablet Take 1 tablet (10 mg total) by  mouth at bedtime as needed for sleep. 90 tablet 0   No current facility-administered medications on file prior to visit.      Allergies  Allergen Reactions  . Lantus [Insulin Glargine] Other (See Comments)    headaches     Review of Systems:   General:  Denies fever, chills Optho/Auditory:   Denies visual changes, blurred vision Respiratory:   Denies SOB, cough, wheeze, DIB  Cardiovascular:   Denies chest pain, palpitations, painful respirations Gastrointestinal:   Denies nausea, vomiting, diarrhea.  Endocrine:     Denies new hot or cold intolerance Musculoskeletal:  Denies joint swelling, gait issues, or new unexplained myalgias/ arthralgias Skin:  Denies rash, suspicious lesions  Neurological:    Denies dizziness, unexplained weakness, numbness  Psychiatric/Behavioral:   Denies mood changes    Objective:     Blood pressure 110/77, pulse 99, height 5\' 6"  (1.676 m), weight 173 lb 12.8 oz (78.8 kg), SpO2 98 %.  Body mass index is 28.05 kg/m.  General: Well Developed, well nourished, and in no acute distress.  HEENT: Normocephalic, atraumatic, pupils equal round reactive to light, neck supple, No carotid bruits, no JVD Skin: Warm and dry, cap RF less 2 sec Cardiac: Regular rate and rhythm, S1, S2 WNL's, no murmurs rubs or gallops Respiratory: ECTA B/L, Not using accessory muscles, speaking in full sentences. NeuroM-Sk: Ambulates w/o assistance, moves ext * 4 w/o difficulty, sensation grossly intact.  Ext: scant edema b/l lower ext Psych: No HI/SI, judgement and insight good, Euthymic mood. Full Affect. Abdominal: Hypoactive bowel sounds, but present all four quadrants.  No guarding, rigidity, rebound; no organomegaly.  Mild bloating.

## 2017-12-16 ENCOUNTER — Encounter: Payer: Self-pay | Admitting: Obstetrics & Gynecology

## 2017-12-16 ENCOUNTER — Other Ambulatory Visit: Payer: Self-pay

## 2017-12-16 ENCOUNTER — Ambulatory Visit: Payer: BLUE CROSS/BLUE SHIELD | Admitting: Obstetrics & Gynecology

## 2017-12-16 ENCOUNTER — Encounter

## 2017-12-16 VITALS — BP 120/74 | HR 96 | Resp 16 | Ht 67.5 in | Wt 175.0 lb

## 2017-12-16 DIAGNOSIS — N83201 Unspecified ovarian cyst, right side: Secondary | ICD-10-CM | POA: Diagnosis not present

## 2017-12-16 NOTE — Progress Notes (Signed)
47 y.o. [id number] MarriedCaucasianF here for new patient exam.  She was referred from Dr. Raliegh Scarlet.  Pt reports she's been having some pelvic discomfort, pelvic pressure, some associated nausea, bowel function changes.  Ultrasound was obtained 11/27/17 showing a right 1.7PZ cyst/follicle.  Left ovary was not seen.  Former patient of Dr. Garwin Brothers.  Has done pap in the last year or two.  Unsure if had HR HPV testing done.  Has also had several ultrasounds due to an ovarian cyst.  I do not have any of these results.    H/O insulin requiring diabetes.  Has been on insulin in the past.  Has lost weight and is now only on oral medications.  Last HbA1C was 9.6.  Was 14 so this is much better.    Still cycling.  Cycles are regular.    Patient's last menstrual period was 11/21/2017 (approximate).          Sexually active: Yes.    The current method of family planning is tubal ligation.    Exercising: Yes.    Walk Smoker:  no  Health Maintenance: Pap:  2017 Normal   05/27/12 Neg  History of abnormal Pap:  no MMG:  06/04/16 BIRADS1:Neg  Colonoscopy:  never BMD:   Never TDaP:  2013 Pneumonia vaccine(s):  2015 Shingrix:   n/a Hep C testing: n/a Screening Labs: PCP   reports that she quit smoking about 13 years ago. Her smoking use included cigarettes. She has a 15.00 pack-year smoking history. She has never used smokeless tobacco. She reports that she drinks alcohol. She reports that she does not use drugs.  Past Medical History:  Diagnosis Date  . Clomid pregnancy 1997, 2005  . Depression   . Diabetes mellitus without mention of complication   . Esophageal reflux   . Headache(784.0)   . Infertility, female    PCOS - Clomid pregnancies   . PCOS (polycystic ovarian syndrome) 05/27/2012  . Pure hypercholesterolemia   . Rosacea     Past Surgical History:  Procedure Laterality Date  . APPENDECTOMY    . CESAREAN SECTION     triplets and singles  . TUBAL LIGATION      Current Outpatient  Medications  Medication Sig Dispense Refill  . buPROPion (WELLBUTRIN XL) 300 MG 24 hr tablet Take 1 tablet (300 mg total) by mouth daily. 90 tablet 1  . glipiZIDE (GLUCOTROL XL) 10 MG 24 hr tablet Take 1 tablet (10 mg total) by mouth daily with breakfast. 90 tablet 1  . metFORMIN (GLUCOPHAGE-XR) 500 MG 24 hr tablet Take 4 tablets (2,000 mg total) by mouth daily with breakfast. 360 tablet 1  . Semaglutide (OZEMPIC) 1 MG/DOSE SOPN Inject 1 mg into the skin once a week. 12 pen 3  . Vitamin D, Ergocalciferol, (DRISDOL) 50000 units CAPS capsule Please take 1 tablet Wednesday and 1 tablet Sunday every week 24 capsule 3  . ALPRAZolam (XANAX) 0.5 MG tablet Take 0.5 tablets (0.25 mg total) by mouth as needed (only for panic attacks). (Patient not taking: Reported on 12/16/2017) 30 tablet 0  . fluticasone (FLONASE) 50 MCG/ACT nasal spray 1 spray each nostril after sinus rinse twice daily (Patient not taking: Reported on 12/16/2017) 16 g 2  . omeprazole (PRILOSEC) 20 MG capsule 1 tablet twice daily x6 weeks then 1 p.o. daily (Patient not taking: Reported on 12/16/2017) 60 capsule 1  . ondansetron (ZOFRAN-ODT) 8 MG disintegrating tablet Take 1 tablet (8 mg total) by mouth every 8 (eight) hours as  needed for nausea. (Patient not taking: Reported on 12/16/2017) 20 tablet 3  . ranitidine (ZANTAC) 300 MG tablet 2 p.o. twice daily for 6 weeks then 1 p.o. twice daily (Patient not taking: Reported on 12/16/2017) 180 tablet 3  . zolpidem (AMBIEN) 10 MG tablet Take 1 tablet (10 mg total) by mouth at bedtime as needed for sleep. (Patient not taking: Reported on 12/16/2017) 90 tablet 0   No current facility-administered medications for this visit.     Family History  Problem Relation Age of Onset  . Cancer Mother        breast  . Heart disease Mother   . Stroke Mother   . Diabetes Mother   . Diabetes Father   . Breast cancer Maternal Aunt     Review of Systems  Gastrointestinal: Positive for abdominal pain.  All  other systems reviewed and are negative.   Exam:   BP 120/74 (BP Location: Right Arm, Patient Position: Sitting, Cuff Size: Normal)   Pulse 96   Resp 16   Ht 5' 7.5" (1.715 m)   Wt 175 lb (79.4 kg)   LMP 11/21/2017 (Approximate)   BMI 27.00 kg/m    Height: 5' 7.5" (171.5 cm)  Ht Readings from Last 3 Encounters:  12/16/17 5' 7.5" (1.715 m)  12/09/17 5\' 6"  (1.676 m)  11/19/17 5\' 6"  (1.676 m)    General appearance: alert, cooperative and appears stated age No physical exam performed today.  D/w pt getting outside records, figuring out what Pap smear is needed, if any and then returning for repeat ultrasound and physical exam at that time.  Pt comfortable with this plan.  Chaperone was present for exam.  A:  Pelvic pressure H/O 5.2cm right ovarian cyst Diabetes with most recent HbA1C of 9.6  P:   Mammogram guidelines reviewed.  Pt knows due and ready to schedule Release for outside records obtained--pap and ultrasounds Pt will return for repeat PUS in about six weeks.  If ovarian finding still present or present on prior ultrasounds, will need to consider surgical excision Of course, HBA1C will need to be better for surgery planning.  Does have follow up scheduled with Dr. Raliegh Scarlet 01/27/18.  ~30 minutes spent with patient >50% of time was in face to face discussion of above.

## 2017-12-19 ENCOUNTER — Encounter: Payer: Self-pay | Admitting: Obstetrics & Gynecology

## 2018-01-21 ENCOUNTER — Other Ambulatory Visit: Payer: Self-pay | Admitting: *Deleted

## 2018-01-21 DIAGNOSIS — N83201 Unspecified ovarian cyst, right side: Secondary | ICD-10-CM

## 2018-01-22 ENCOUNTER — Ambulatory Visit (INDEPENDENT_AMBULATORY_CARE_PROVIDER_SITE_OTHER): Payer: BLUE CROSS/BLUE SHIELD

## 2018-01-22 ENCOUNTER — Other Ambulatory Visit (HOSPITAL_COMMUNITY)
Admission: RE | Admit: 2018-01-22 | Discharge: 2018-01-22 | Disposition: A | Discharge status: Home / Self Care | Payer: BLUE CROSS/BLUE SHIELD | Source: Ambulatory Visit | Attending: Obstetrics & Gynecology | Admitting: Obstetrics & Gynecology

## 2018-01-22 ENCOUNTER — Ambulatory Visit (INDEPENDENT_AMBULATORY_CARE_PROVIDER_SITE_OTHER): Payer: BLUE CROSS/BLUE SHIELD | Admitting: Obstetrics & Gynecology

## 2018-01-22 ENCOUNTER — Encounter: Payer: Self-pay | Admitting: Obstetrics & Gynecology

## 2018-01-22 VITALS — BP 120/80 | HR 96 | Resp 16 | Ht 67.5 in | Wt 168.0 lb

## 2018-01-22 DIAGNOSIS — D271 Benign neoplasm of left ovary: Secondary | ICD-10-CM

## 2018-01-22 DIAGNOSIS — Z124 Encounter for screening for malignant neoplasm of cervix: Secondary | ICD-10-CM | POA: Insufficient documentation

## 2018-01-22 DIAGNOSIS — R110 Nausea: Secondary | ICD-10-CM | POA: Diagnosis not present

## 2018-01-22 DIAGNOSIS — N83201 Unspecified ovarian cyst, right side: Secondary | ICD-10-CM | POA: Diagnosis not present

## 2018-01-22 DIAGNOSIS — Z9049 Acquired absence of other specified parts of digestive tract: Secondary | ICD-10-CM

## 2018-01-22 DIAGNOSIS — R1031 Right lower quadrant pain: Secondary | ICD-10-CM

## 2018-01-22 NOTE — Progress Notes (Signed)
See separate note.

## 2018-01-22 NOTE — Progress Notes (Signed)
47 y.o. [id number] Married Caucasian female here for pelvic ultrasound due to 5cm ovarian cyst.  She is having some pulling sensations that have been present about a year and a half.  Has noticed this more in the past few months.  Patient's last menstrual period was 12/22/2017.  Contraception: BTL  Findings:  UTERUS: 9.5 x 5.0 x 4.7cm EMS: 9,6mm ADNEXA: Left ovary: 1.9 x 1.3 x 1.1cm       Right ovary: 4.5 x 6.7 x 3.8cm with two cysts measuring 5.9 x 5.0 x 4.2cm and 2.5 x 2.0 x 2.5cm, avascular CUL DE SAC: no free flui  Discussion:  Findings reviewed.  As cyst has not changed in size, feel removal is appropriate.  Pt is having some tugging/discomfort symptoms that could also be related.    Procedure discussed including removal of right adnexa and left fallopian tube if possible.  Pt has undergone 3 cesarean sections and an open appendectomy.  Her scar is atypical as it is in the RLQ but in a straight line.    Procedure discussed with patient.  Hospital stay, recovery and pain management all discussed.  Port locations (may need to start in [place]).  Risks discussed including but not limited to bleeding, <1% risk of receiving a  transfusion, infection, 1-2% risk of bowel/bladder/ureteral/vascular injury discussed as well as possible need for additional surgery if injury does occur discussed.  DVT/PE and rare risk of death discussed.  My actual complications with prior surgeries discussed.  Hernia formation discussed.  Positioning and incision locations discussed.  Patient aware if pathology abnormal she may need additional treatment.  All questions answered.    Physical Exam  Constitutional: She is oriented to person, place, and time. She appears well-developed and well-nourished.  Cardiovascular: Normal rate and regular rhythm.  Respiratory: Effort normal and breath sounds normal.  GI: Soft. Bowel sounds are normal. She exhibits no distension and no mass. There is no tenderness. There is no rebound  and no guarding.  Neurological: She is alert and oriented to person, place, and time.  Skin: Skin is warm and dry.  Psychiatric: She has a normal mood and affect.    Assessment:  Right 6.0cm ovarian cyst, unchanged in size from prior scan and present for the past few years with gradual increase in size during that time Insulin requiring diabetes, HbA1C was 14.0 months ago, then 9.6  Has follow up scheduled on Tuesday with lab work planned  Plan:  Pt aware as this does have benign features, goal for HbA1C would be <7.5.  If not at that level, I feel it is ok to continue watching until she gets to that point.  Pt voices clear understanding of this and reasoning behind it (infection risks) Ca-125 will be obtained today Pap obtained as well with HR HPV  ~30 minutes spent with patient >50% of time was in face to face discussion of above.

## 2018-01-23 LAB — CYTOLOGY - PAP
DIAGNOSIS: NEGATIVE
HPV (WINDOPATH): NOT DETECTED

## 2018-01-23 LAB — CA 125: Cancer Antigen (CA) 125: 5.1 U/mL (ref 0.0–38.1)

## 2018-01-26 ENCOUNTER — Encounter: Payer: Self-pay | Admitting: Obstetrics & Gynecology

## 2018-01-27 ENCOUNTER — Ambulatory Visit: Payer: BLUE CROSS/BLUE SHIELD | Admitting: Family Medicine

## 2018-01-27 ENCOUNTER — Encounter: Payer: Self-pay | Admitting: Family Medicine

## 2018-01-27 VITALS — BP 102/72 | HR 114 | Ht 68.0 in | Wt 167.0 lb

## 2018-01-27 DIAGNOSIS — F331 Major depressive disorder, recurrent, moderate: Secondary | ICD-10-CM

## 2018-01-27 DIAGNOSIS — E1165 Type 2 diabetes mellitus with hyperglycemia: Secondary | ICD-10-CM

## 2018-01-27 DIAGNOSIS — E785 Hyperlipidemia, unspecified: Secondary | ICD-10-CM

## 2018-01-27 DIAGNOSIS — K219 Gastro-esophageal reflux disease without esophagitis: Secondary | ICD-10-CM | POA: Diagnosis not present

## 2018-01-27 DIAGNOSIS — E1169 Type 2 diabetes mellitus with other specified complication: Secondary | ICD-10-CM | POA: Diagnosis not present

## 2018-01-27 DIAGNOSIS — R142 Eructation: Secondary | ICD-10-CM

## 2018-01-27 DIAGNOSIS — K30 Functional dyspepsia: Secondary | ICD-10-CM

## 2018-01-27 DIAGNOSIS — R110 Nausea: Secondary | ICD-10-CM

## 2018-01-27 LAB — POCT GLYCOSYLATED HEMOGLOBIN (HGB A1C): HbA1c, POC (controlled diabetic range): 5.9 % (ref 0.0–7.0)

## 2018-01-27 MED ORDER — RANITIDINE HCL 300 MG PO TABS: ORAL_TABLET | ORAL | 3 refills | Status: DC

## 2018-01-27 NOTE — Patient Instructions (Addendum)
Stop your Glucotrol, please make sure you are monitoring your fasting blood sugars and 2-hour postprandial regularly.  Also whenever you feel poorly-shaky or abnormal, please check your blood sugar.  Also remember the importance of drinking over 80 ounces of water per day.    Also I already sent a message to Dr. Sabra Heck regarding your A1c of 5.9 today!  Great job   Diabetes Mellitus and Standards of Medical Care  Managing diabetes (diabetes mellitus) can be complicated. Your diabetes treatment may be managed by a team of health care providers, including:  A diet and nutrition specialist (registered dietitian).  A nurse.  A certified diabetes educator (CDE).  A diabetes specialist (endocrinologist).  An eye doctor.  A primary care provider.  A dentist.  Your health care providers follow a schedule in order to help you get the best quality of care. The following schedule is a general guideline for your diabetes management plan. Your health care providers may also give you more specific instructions.  HbA1c (hemoglobin A1c) test This test provides information about blood sugar (glucose) control over the previous 2-3 months. It is used to check whether your diabetes management plan needs to be adjusted.  If you are meeting your treatment goals, this test is done at least 2 times a year.  If you are not meeting treatment goals or if your treatment goals have changed, this test is done 4 times a year.  Blood pressure test  This test is done at every routine medical visit. For most people, the goal is less than 130/80. Ask your health care provider what your goal blood pressure should be.  Dental and eye exams  Visit your dentist two times a year.  If you have type 1 diabetes, get an eye exam 3-5 years after you are diagnosed, and then once a year after your first exam. ? If you were diagnosed with type 1 diabetes as a child, get an eye exam when you are age 32 or older and have  had diabetes for 3-5 years. After the first exam, you should get an eye exam once a year.  If you have type 2 diabetes, have an eye exam as soon as you are diagnosed, and then once a year after your first exam.  Foot care exam  Visual foot exams are done at every routine medical visit. The exams check for cuts, bruises, redness, blisters, sores, or other problems with the feet.  A complete foot exam is done by your health care provider once a year. This exam includes an inspection of the structure and skin of your feet, and a check of the pulses and sensation in your feet. ? Type 1 diabetes: Get your first exam 3-5 years after diagnosis. ? Type 2 diabetes: Get your first exam as soon as you are diagnosed.  Check your feet every day for cuts, bruises, redness, blisters, or sores. If you have any of these or other problems that are not healing, contact your health care provider.  Kidney function test (urine microalbumin)  This test is done once a year. ? Type 1 diabetes: Get your first test 5 years after diagnosis. ? Type 2 diabetes: Get your first test as soon as you are diagnosed._  If you have chronic kidney disease (CKD), get a serum creatinine and estimated glomerular filtration rate (eGFR) test once a year.  Lipid profile (cholesterol, HDL, LDL, triglycerides)  This test should be done when you are diagnosed with diabetes, and every 5  years after the first test. If you are on medicines to lower your cholesterol, you may need to get this test done every year. ? The goal for LDL is less than 100 mg/dL (5.5 mmol/L). If you are at high risk, the goal is less than 70 mg/dL (3.9 mmol/L). ? The goal for HDL is 40 mg/dL (2.2 mmol/L) for men and 50 mg/dL(2.8 mmol/L) for women. An HDL cholesterol of 60 mg/dL (3.3 mmol/L) or higher gives some protection against heart disease. ? The goal for triglycerides is less than 150 mg/dL (8.3 mmol/L).  Immunizations  The yearly flu (influenza) vaccine  is recommended for everyone 6 months or older who has diabetes.  The pneumonia (pneumococcal) vaccine is recommended for everyone 2 years or older who has diabetes. If you are 69 or older, you may get the pneumonia vaccine as a series of two separate shots.  The hepatitis B vaccine is recommended for adults shortly after they have been diagnosed with diabetes.  The Tdap (tetanus, diphtheria, and pertussis) vaccine should be given: ? According to normal childhood vaccination schedules, for children. ? Every 10 years, for adults who have diabetes.  The shingles vaccine is recommended for people who have had chicken pox and are 50 years or older.  Mental and emotional health  Screening for symptoms of eating disorders, anxiety, and depression is recommended at the time of diagnosis and afterward as needed. If your screening shows that you have symptoms (you have a positive screening result), you may need further evaluation and be referred to a mental health care provider.  Diabetes self-management education  Education about how to manage your diabetes is recommended at diagnosis and ongoing as needed.  Treatment plan  Your treatment plan will be reviewed at every medical visit.  Summary  Managing diabetes (diabetes mellitus) can be complicated. Your diabetes treatment may be managed by a team of health care providers.  Your health care providers follow a schedule in order to help you get the best quality of care.  Standards of care including having regular physical exams, blood tests, blood pressure monitoring, immunizations, screening tests, and education about how to manage your diabetes.  Your health care providers may also give you more specific instructions based on your individual health.      Type 2 Diabetes Mellitus, Self Care, Adult Caring for yourself after you have been diagnosed with type 2 diabetes (type 2 diabetes mellitus) means keeping your blood sugar (glucose)  under control with a balance of:  Nutrition.  Exercise.  Lifestyle changes.  Medicines or insulin, if necessary.  Support from your team of health care providers and others.  The following information explains what you need to know to manage your diabetes at home. What do I need to do to manage my blood glucose?  Check your blood glucose every day, as often as told by your health care provider.  Contact your health care provider if your blood glucose is above your target for 2 tests in a row.  Have your A1c (hemoglobin A1c) level checked at least two times a year, or as often as told by your health care provider. Your health care provider will set individualized treatment goals for you. Generally, the goal of treatment is to maintain the following blood glucose levels:  Before meals (preprandial): 80-130 mg/dL (4.4-7.2 mmol/L).  After meals (postprandial): below 180 mg/dL (10 mmol/L).  A1c level: less than 7%.  What do I need to know about hyperglycemia and hypoglycemia? What  is hyperglycemia? Hyperglycemia, also called high blood glucose, occurs when blood glucose is too high.Make sure you know the early signs of hyperglycemia, such as:  Increased thirst.  Hunger.  Feeling very tired.  Needing to urinate more often than usual.  Blurry vision.  What is hypoglycemia? Hypoglycemia, also called low blood glucose, occurswith a blood glucose level at or below 70 mg/dL (3.9 mmol/L). The risk for hypoglycemia increases during or after exercise, during sleep, during illness, and when skipping meals or not eating for a long time (fasting). It is important to know the symptoms of hypoglycemia and treat it right away. Always have a 15-gram rapid-acting carbohydrate snack with you to treat low blood glucose. Family members and close friends should also know the symptoms and should understand how to treat hypoglycemia, in case you are not able to treat yourself. What are the  symptoms of hypoglycemia? Hypoglycemia symptoms can include:  Hunger.  Anxiety.  Sweating and feeling clammy.  Confusion.  Dizziness or feeling light-headed.  Sleepiness.  Nausea.  Increased heart rate.  Headache.  Blurry vision.  Seizure.  Nightmares.  Tingling or numbness around the mouth, lips, or tongue.  A change in speech.  Decreased ability to concentrate.  A change in coordination.  Restless sleep.  Tremors or shakes.  Fainting.  Irritability.  How do I treat hypoglycemia?  If you are alert and able to swallow safely, follow the 15:15 rule:  Take 15 grams of a rapid-acting carbohydrate. Rapid-acting options include: ? 1 tube of glucose gel. ? 3 glucose pills. ? 6-8 pieces of hard candy. ? 4 oz (120 mL) of fruit juice. ? 4 oz (120 mL) of regular (not diet) soda.  Check your blood glucose 15 minutes after you take the carbohydrate.  If the repeat blood glucose level is still at or below 70 mg/dL (3.9 mmol/L), take 15 grams of a carbohydrate again.  If your blood glucose level does not increase above 70 mg/dL (3.9 mmol/L) after 3 tries, seek emergency medical care.  After your blood glucose level returns to normal, eat a meal or a snack within 1 hour.  How do I treat severe hypoglycemia? Severe hypoglycemia is when your blood glucose level is at or below 54 mg/dL (3 mmol/L). Severe hypoglycemia is an emergency. Do not wait to see if the symptoms will go away. Get medical help right away. Call your local emergency services (911 in the [place] Do not drive yourself to the hospital. If you have severe hypoglycemia and you cannot eat or drink, you may need an injection of glucagon. A family member or close friend should learn how to check your blood glucose and how to give you a glucagon injection. Ask your health care provider if you need to have an emergency glucagon injection kit available. Severe hypoglycemia may need to be treated in a hospital.  The treatment may include getting glucose through an IV tube. You may also need treatment for the cause of your hypoglycemia. Can having diabetes put me at risk for other conditions? Having diabetes can put you at risk for other long-term (chronic) conditions, such as heart disease and kidney disease. Your health care provider may prescribe medicines to help prevent complications from diabetes. These medicines may include:  Aspirin.  Medicine to lower cholesterol.  Medicine to control blood pressure.  What else can I do to manage my diabetes? Take your diabetes medicines as told  If your health care provider prescribed insulin or diabetes medicines, take  them every day.  Do not run out of insulin or other diabetes medicines that you take. Plan ahead so you always have these available.  If you use insulin, adjust your dosage based on how physically active you are and what foods you eat. Your health care provider will tell you how to adjust your dosage. Make healthy food choices  The things that you eat and drink affect your blood glucose and your insulin dosage. Making good choices helps to control your diabetes and prevent other health problems. A healthy meal plan includes eating lean proteins, complex carbohydrates, fresh fruits and vegetables, low-fat dairy products, and healthy fats. Make an appointment to see a diet and nutrition specialist (registered dietitian) to help you create an eating plan that is right for you. Make sure that you:  Follow instructions from your health care provider about eating or drinking restrictions.  Drink enough fluid to keep your urine clear or pale yellow.  Eat healthy snacks between nutritious meals.  Track the carbohydrates that you eat. Do this by reading food labels and learning the standard serving sizes of foods.  Follow your sick day plan whenever you cannot eat or drink as usual. Make this plan in advance with your health care  provider.  Stay active  Exercise regularly, as told by your health care provider. This may include:  Stretching and doing strength exercises, such as yoga or weightlifting, at least 2 times a week.  Doing at least 150 minutes of moderate-intensity or vigorous-intensity exercise each week. This could be brisk walking, biking, or water aerobics. ? Spread out your activity over at least 3 days of the week. ? Do not go more than 2 days in a row without doing some kind of physical activity.  When you start a new exercise or activity, work with your health care provider to adjust your insulin, medicines, or food intake as needed. Make healthy lifestyle choices  Do not use any tobacco products, such as cigarettes, chewing tobacco, and e-cigarettes. If you need help quitting, ask your health care provider.  If your health care provider says that alcohol is safe for you, limit alcohol intake to no more than 1 drink per day for nonpregnant women and 2 drinks per day for men. One drink equals 12 oz of beer, 5 oz of wine, or 1 oz of hard liquor.  Learn to manage stress. If you need help with this, ask your health care provider. Care for your body   Keep your immunizations up to date. In addition to getting vaccinations as told by your health care provider, it is recommended that you get vaccinated against the following illnesses: ? The flu (influenza). Get a flu shot every year. ? Pneumonia. ? Hepatitis B.  Schedule an eye exam soon after your diagnosis, and then one time every year after that.  Check your skin and feet every day for cuts, bruises, redness, blisters, or sores. Schedule a foot exam with your health care provider once every year.  Brush your teeth and gums two times a day, and floss at least one time a day. Visit your dentist at least once every 6 months.  Maintain a healthy weight. General instructions  Take over-the-counter and prescription medicines only as told by your  health care provider.  Share your diabetes management plan with people in your workplace, school, and household.  Check your urine for ketones when you are ill and as told by your health care provider.  Ask your  health care provider: ? Do I need to meet with a diabetes educator? ? Where can I find a support group for people with diabetes?  Carry a medical alert card or wear medical alert jewelry.  Keep all follow-up visits as told by your health care provider. This is important. Where to find more information: For more information about diabetes, visit:  American Diabetes Association (ADA): www.diabetes.org  American Association of Diabetes Educators (AADE): www.diabeteseducator.org/patient-resources  This information is not intended to replace advice given to you by your health care provider. Make sure you discuss any questions you have with your health care provider. Document Released: 10/09/2015 Document Revised: 11/23/2015 Document Reviewed: 07/21/2015 Elsevier Interactive Patient Education  2017 [employer].      Blood Glucose Monitoring, Adult Monitoring your blood sugar (glucose) helps you manage your diabetes. It also helps you and your health care provider determine how well your diabetes management plan is working. Blood glucose monitoring involves checking your blood glucose as often as directed, and keeping a record (log) of your results over time. Why should I monitor my blood glucose? Checking your blood glucose regularly can:  Help you understand how food, exercise, illnesses, and medicines affect your blood glucose.  Let you know what your blood glucose is at any time. You can quickly tell if you are having low blood glucose (hypoglycemia) or high blood glucose (hyperglycemia).  Help you and your health care provider adjust your medicines as needed.  When should I check my blood glucose? Follow instructions from your health care provider about how often to  check your blood glucose.   This may depend on:  The type of diabetes you have.  How well-controlled your diabetes is.  Medicines you are taking.  If you have type 1 diabetes:  Check your blood glucose at least 2 times a day.  Also check your blood glucose: ? Before every insulin injection. ? Before and after exercise. ? Between meals. ? 2 hours after a meal. ? Occasionally between 2:00 a.m. and 3:00 a.m., as directed. ? Before potentially dangerous tasks, like driving or using heavy machinery. ? At bedtime.  You may need to check your blood glucose more often, up to 6-10 times a day: ? If you use an insulin pump. ? If you need multiple daily injections (MDI). ? If your diabetes is not well-controlled. ? If you are ill. ? If you have a history of severe hypoglycemia. ? If you have a history of not knowing when your blood glucose is getting low (hypoglycemia unawareness).  If you have type 2 diabetes:  If you take insulin or other diabetes medicines, check your blood glucose at least 2 times a day.  If you are on intensive insulin therapy, check your blood glucose at least 4 times a day. Occasionally, you may also need to check between 2:00 a.m. and 3:00 a.m., as directed.  Also check your blood glucose: ? Before and after exercise. ? Before potentially dangerous tasks, like driving or using heavy machinery.  You may need to check your blood glucose more often if: ? Your medicine is being adjusted. ? Your diabetes is not well-controlled. ? You are ill.  What is a blood glucose log?  A blood glucose log is a record of your blood glucose readings. It helps you and your health care provider: ? Look for patterns in your blood glucose over time. ? Adjust your diabetes management plan as needed.  Every time you check your blood glucose,  write down your result and notes about things that may be affecting your blood glucose, such as your diet and exercise for the day.  Most  glucose meters store a record of glucose readings in the meter. Some meters allow you to download your records to a computer. How do I check my blood glucose? Follow these steps to get accurate readings of your blood glucose: Supplies needed   Blood glucose meter.  Test strips for your meter. Each meter has its own strips. You must use the strips that come with your meter.  A needle to prick your finger (lancet). Do not use lancets more than once.  A device that holds the lancet (lancing device).  A journal or log book to write down your results.  Procedure  Wash your hands with soap and water.  Prick the side of your finger (not the tip) with the lancet. Use a different finger each time.  Gently rub the finger until a small drop of blood appears.  Follow instructions that come with your meter for inserting the test strip, applying blood to the strip, and using your blood glucose meter.  Write down your result and any notes.  Alternative testing sites  Some meters allow you to use areas of your body other than your finger (alternative sites) to test your blood.  If you think you may have hypoglycemia, or if you have hypoglycemia unawareness, do not use alternative sites. Use your finger instead.  Alternative sites may not be as accurate as the fingers, because blood flow is slower in these areas. This means that the result you get may be delayed, and it may be different from the result that you would get from your finger.  The most common alternative sites are: ? Forearm. ? Thigh. ? Palm of the hand.  Additional tips  Always keep your supplies with you.  If you have questions or need help, all blood glucose meters have a 24-hour "hotline" number that you can call. You may also contact your health care provider.  After you use a few boxes of test strips, adjust (calibrate) your blood glucose meter by following instructions that came with your meter.    The American  Diabetes Association suggests the following targets for most nonpregnant adults with diabetes.  More or less stringent glycemic goals may be appropriate for each individual.  A1C: Less than 7% A1C may also be reported as eAG: Less than 154 mg/dl Before a meal (preprandial plasma glucose): 80-130 mg/dl 1-2 hours after beginning of the meal (Postprandial plasma glucose)*: Less than 180 mg/dl  *Postprandial glucose may be targeted if A1C goals are not met despite reaching preprandial glucose goals.   GOALS in short:  The goals are for the Hgb A1C to be less than 7.0 & blood pressure to be less than 130/80.    It is recommended that all diabetics are educated on and follow a healthy diabetic diet, exercise for 30 minutes 3-4 times per week (walking, biking, swimming, or machine), monitor blood glucose readings and bring that record with you to be reviewed at your next office visit.     You should be checking fasting blood sugars- especially after you eat poorly or eat really healthy, and also check 2 hour postprandial blood sugars after largest meal of the day.    Write these down and bring in your log at each office visit.    You will need to be seen every 3 months by the provider  managing your Diabetes unless told otherwise by that provider.   You will need yearly eye exams from an eye specialist and foot exams to check the nerves of your feet.  Also, your urine should be checked yearly as well to make sure excess protein is not present.   If you are checking your blood pressure at home, please record it and bring it to your next office visit.    Follow the Dietary Approaches to Stop Hypertension (DASH) diet (3 servings of fruit and vegetables daily, whole grains, low sodium, low-fat proteins).  See below.    Lastly, when it comes to your cholesterol, the goal is to have the HDL (good cholesterol) >40, and the LDL (bad cholesterol) <100.   It is recommended that you follow a heart healthy, low  saturated and trans-fat diet and exercise for 30 minutes at least 5 times a week.     (( Check out the DASH diet = 1.5 Gram Low Sodium Diet   A 1.5 gram sodium diet restricts the amount of sodium in the diet to no more than 1.5 g or 1500 mg daily.  The American Heart Association recommends Americans over the age of 40 to consume no more than 1500 mg of sodium each day to reduce the risk of developing high blood pressure.  Research also shows that limiting sodium may reduce heart attack and stroke risk.  Many foods contain sodium for flavor and sometimes as a preservative.  When the amount of sodium in a diet needs to be low, it is important to know what to look for when choosing foods and drinks.  The following includes some information and guidelines to help make it easier for you to adapt to a low sodium diet.    QUICK TIPS  Do not add salt to food.  Avoid convenience items and fast food.  Choose unsalted snack foods.  Buy lower sodium products, often labeled as "lower sodium" or "no salt added."  Check food labels to learn how much sodium is in 1 serving.  When eating at a restaurant, ask that your food be prepared with less salt or none, if possible.    READING FOOD LABELS FOR SODIUM INFORMATION  The nutrition facts label is a good place to find how much sodium is in foods. Look for products with no more than 400 mg of sodium per serving.  Remember that 1.5 g = 1500 mg.  The food label may also list foods as:  Sodium-free: Less than 5 mg in a serving.  Very low sodium: 35 mg or less in a serving.  Low-sodium: 140 mg or less in a serving.  Light in sodium: 50% less sodium in a serving. For example, if a food that usually has 300 mg of sodium is changed to become light in sodium, it will have 150 mg of sodium.  Reduced sodium: 25% less sodium in a serving. For example, if a food that usually has 400 mg of sodium is changed to reduced sodium, it will have 300 mg of sodium.    CHOOSING  FOODS  Grains  Avoid: Salted crackers and snack items. Some cereals, including instant hot cereals. Bread stuffing and biscuit mixes. Seasoned rice or pasta mixes.  Choose: Unsalted snack items. Low-sodium cereals, oats, puffed wheat and rice, shredded wheat. English muffins and bread. Pasta.  Meats  Avoid: Salted, canned, smoked, spiced, pickled meats, including fish and poultry. Bacon, ham, sausage, cold cuts, hot dogs, anchovies.  Choose: Low-sodium  canned tuna and salmon. Fresh or frozen meat, poultry, and fish.  Dairy  Avoid: Processed cheese and spreads. Cottage cheese. Buttermilk and condensed milk. Regular cheese.  Choose: Milk. Low-sodium cottage cheese. Yogurt. Sour cream. Low-sodium cheese.  Fruits and Vegetables  Avoid: Regular canned vegetables. Regular canned tomato sauce and paste. Frozen vegetables in sauces. Olives. Angie Fava. Relishes. Sauerkraut.  Choose: Low-sodium canned vegetables. Low-sodium tomato sauce and paste. Frozen or fresh vegetables. Fresh and frozen fruit.  Condiments  Avoid: Canned and packaged gravies. Worcestershire sauce. Tartar sauce. Barbecue sauce. Soy sauce. Steak sauce. Ketchup. Onion, garlic, and table salt. Meat flavorings and tenderizers.  Choose: Fresh and dried herbs and spices. Low-sodium varieties of mustard and ketchup. Lemon juice. Tabasco sauce. Horseradish.    SAMPLE 1.5 GRAM SODIUM MEAL PLAN:   Breakfast / Sodium (mg)  1 cup low-fat milk / 143 mg  1 whole-wheat English muffin / 240 mg  1 tbs heart-healthy margarine / 153 mg  1 hard-boiled egg / 139 mg  1 small orange / 0 mg  Lunch / Sodium (mg)  1 cup raw carrots / 76 mg  2 tbs no salt added peanut butter / 5 mg  2 slices whole-wheat bread / 270 mg  1 tbs jelly / 6 mg   cup red grapes / 2 mg  Dinner / Sodium (mg)  1 cup whole-wheat pasta / 2 mg  1 cup low-sodium tomato sauce / 73 mg  3 oz lean ground beef / 57 mg  1 small side salad (1 cup raw spinach leaves,  cup cucumber,   cup yellow bell pepper) with 1 tsp olive oil and 1 tsp red wine vinegar / 25 mg  Snack / Sodium (mg)  1 container low-fat vanilla yogurt / 107 mg  3 graham cracker squares / 127 mg  Nutrient Analysis  Calories: 1745  Protein: 75 g  Carbohydrate: 237 g  Fat: 57 g  Sodium: 1425 mg  Document Released: 06/17/2005 Document Revised: 02/27/2011 Document Reviewed: 09/18/2009  ExitCare Patient Information 2012 [place].))    This information is not intended to replace advice given to you by your health care provider. Make sure you discuss any questions you have with your health care provider. Document Released: 06/20/2003 Document Revised: 01/05/2016 Document Reviewed: 11/27/2015 Elsevier Interactive Patient Education  2017 [employer].

## 2018-01-27 NOTE — Progress Notes (Signed)
Impression and Recommendations:    1. Poorly controlled type 2 diabetes mellitus ([name])   2. Hyperlipidemia associated with type 2 diabetes mellitus ([name])-  LDL at goal, dietary and lifestyle modification treatment only   3. Moderate episode of recurrent major depressive disorder ([name])   4. Gastroesophageal reflux disease, esophagitis presence not specified   5. Nausea   6. Acid indigestion   7. Gaseous regurgitation     1. Diabetes - Blood sugar more optimally controlled at this time.   - A1c down to 5.9 from 9.6 prior, and >14 in February 2019. - Recommended change in plan today; discontinued Glucotrol.  See med list today. - Patient tolerating meds well without complication.  Denies S-E - Continue other management and medication as recommended.  - Patient's fasting sugars are around 99-115, down from 120-130. - Reviewed goal fasting sugars as under 130, with A1c of less than 7.  - Encouraged patient to continue with prudentdietary and activity modifications.  - Counseled patient on pathophysiology of disease and discussed various treatment options, which often includes dietary and lifestyle modifications as first line. Importance of low carb/ketogenic diet discussed with patient in addition to regular exercise.   - Encouraged patient to check her blood sugar more often, FBS on waking, and 2 hours postprandial, after the biggest meal of your day. Keep log and bring in next OV for my review. Also, if you ever feel poorly, please check your blood pressure and blood sugar, as one or the other could be the cause of your symptoms.  - Will continue to monitorclosely.  - Patient will let us know in a couple of weeks what her sugars are running.  If they are running higher, such as in the 140's, we will manage treatment plan further at that time.  2. Chronic Nausea/Gas/Bloating - GERD - Continue treatment as prescribed.  See med list today. - Patient discontinued taking  Prilosec; taking one Zantac daily. - Encouraged patient to continue using Zantac instead of Prilosec. - Encouraged patient to keep upcoming GI appointment in September.  - Continue tracking symptoms, tracking foods eaten when symptoms occur, and monitoring her blood sugars when these symptoms occur.  3. Mood - Continue management as prescribed.  No changes made today.  Will continue to monitor.  - Advised patient to continue using Xanax as sparingly as possible.  4. General Health Maintenance  BMI Counseling Explained to patient what BMI refers to, and what it means medically.    Told patient to think about it as a "medical risk stratification measurement" and how increasing BMI is associated with increasing risk/ or worsening state of various diseases such as hypertension, hyperlipidemia, diabetes, premature OA, depression etc.  American Heart Association guidelines for healthy diet, basically Mediterranean diet, and exercise guidelines of 30 minutes 5 days per week or more discussed in detail.  Health counseling performed.  All questions answered.  - Advised patient to continue working toward exercising to improve health.   - Continue engaging in daily physical activity.  Recommended that the patient eventually strive for at least 150 minutes ofmoderatecardiovascular activityper week according to guidelines established by the [facility].   - Healthy dietary habits encouraged, including low-carb, and high amounts of lean protein in diet.   - Patient should also consume adequate amounts of water - half of body weight in oz of water per day.   Education and routine counseling performed. Handouts provided.  5. Follow-Up - Return in 3 months for  regularly scheduled chronic DM follow-up. - Patient desires to lose 10 lbs in the next 3 months.  - Patient will let us know in a week how her blood sugars are doing. - At that time, will also discuss if she wishes to change DM  treatment plan.  - Patient plans on having surgery for her ovarian cyst through [place] patient to drink plenty of water prior, over half of her weight in oz of water per day.   Orders Placed This Encounter  Procedures  . POCT glycosylated hemoglobin (Hb A1C)   Medications Discontinued During This Encounter  Medication Reason  . omeprazole (PRILOSEC) 20 MG capsule Completed Course  . glipiZIDE (GLUCOTROL XL) 10 MG 24 hr tablet Discontinued by provider  . ranitidine (ZANTAC) 300 MG tablet     Medications Discontinued During This Encounter  Medication Reason  . omeprazole (PRILOSEC) 20 MG capsule Completed Course  . glipiZIDE (GLUCOTROL XL) 10 MG 24 hr tablet Discontinued by provider  . ranitidine (ZANTAC) 300 MG tablet       Meds ordered this encounter  Medications  . ranitidine (ZANTAC) 300 MG tablet    Sig: 1/2 tab p.o. twice daily    Dispense:  90 tablet    Refill:  3    Return in about 3 months (around 04/29/2018) for diabetes follow up every 3 mo; stopped glipizide.   The patient was counseled, risk factors were discussed, anticipatory guidance given.  Gross side effects, risk and benefits, and alternatives of medications discussed with patient.  Patient is aware that all medications have potential side effects and we are unable to predict every side effect or drug-drug interaction that may occur.  Expresses verbal understanding and consents to current therapy plan and treatment regimen.  Please see AVS handed out to patient at the end of our visit for further patient instructions/ counseling done pertaining to today's office visit.    Note:  This document was prepared using Dragon voice recognition software and may include unintentional dictation errors.  This document serves as a record of services personally performed by Mellody Dance, DO. It was created on her behalf by Toni Amend, a trained medical scribe. The creation of this record is based on  the scribe's personal observations and the provider's statements to them.   I have reviewed the above medical documentation for accuracy and completeness and I concur.  Mellody Dance 02/09/18 10:48 AM    Subjective:    Chief Complaint  Patient presents with  . Follow-up    Lindsey Mason is a 47 y.o. female who presents to [place] [facility] today for Diabetes Management.    Mood Good at this time.  Denies needing the Xanax at all.  Stomach Pain Tends to need a Zofran two days after her Ozempic injection. Notes that this S-E has gotten better over time.  She can't be seen by GI until September 9th; she is keeping the appointment. Notes she is still taking her Zantac, one tablet daily.  Has not been taking Prilosec.  DM HPI: -  She has been working on diet and exercise for diabetes.  Walking 10 minutes per day; trying for 20.  Notes she walks through the church to avoid the heat.  States she sometimes feels like she's having low blood sugar, and she will check her blood sugar, and find it at 90.  Pt is currently maintained on the following medications for diabetes:   see med  list today Medication compliance - Patient continues compliance with her medications.  Home glucose readings range: Fasting: Running around 99 to 110.  Notes "I don't even think I've been as high as 110." 92, 101, 115, 105, 93, down from 130, 120.   Denies polyuria/polydipsia. Denies hypo/ hyperglycemia symptoms - She denies new onset of: chest pain, exercise intolerance, shortness of breath, dizziness, visual changes, headache, lower extremity swelling or claudication.   Last diabetic eye exam was  Lab Results  Component Value Date   HMDIABEYEEXA No Retinopathy 12/01/2017    Foot exam- UTD  Last A1C in the office was:  Lab Results  Component Value Date   HGBA1C 5.9 01/27/2018   HGBA1C 9.6 11/04/2017   HGBA1C >14 08/04/2017    Lab Results  Component Value Date     MICROALBUR 10 08/04/2017   LDLCALC 81 08/14/2017   CREATININE 0.54 (L) 11/19/2017      Last 3 blood pressure readings in our office are as follows: BP Readings from Last 3 Encounters:  02/03/18 120/80  01/27/18 102/72  01/22/18 120/80    BMI Readings from Last 3 Encounters:  02/03/18 25.39 kg/m  01/27/18 25.39 kg/m  01/22/18 25.92 kg/m     No problems updated.    Patient Care Team    Relationship Specialty Notifications Start End  Mellody Dance, DO PCP - General Family Medicine  08/14/17   Marin Comment, My [place], [place] Referring Physician Optometry  08/04/17      Patient Active Problem List   Diagnosis Date Noted  . Mixed diabetic hyperlipidemia associated with type 2 diabetes mellitus ([place]) 08/26/2017    Priority: High  . Major depressive disorder, recurrent episode ([place]) 08/20/2010    Priority: High  . Hyperlipidemia associated with type 2 diabetes mellitus ([place]) 04/11/2009    Priority: High  . Type 2 diabetes mellitus ([place]) 02/06/2018  . h/o Enlarged ovaries 11/20/2017  . Status post appendectomy 11/19/2017  . Environmental and seasonal allergies 11/04/2017  . Psychophysiological insomnia 11/04/2017  . Vitamin D deficiency 08/26/2017  . Depression 09/03/2016  . Overweight (BMI 25.0-29.9) 09/03/2016  . Witnessed episode of apnea 09/03/2016  . Urinary frequency 04/03/2016  . 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 PCOS (polycystic ovarian syndrome) 05/27/2012  . 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 GERD 03/08/2009     Past Medical History:  Diagnosis Date  . Clomid pregnancy 1997, 2005  . Depression   . Diabetes mellitus without mention of complication   . Esophageal reflux   . Headache(784.0)   . Infertility, female    PCOS - Clomid pregnancies   . Ovarian cyst    Right  . PCOS (polycystic ovarian syndrome) 05/27/2012  . Pure hypercholesterolemia   . Rosacea      Past Surgical History:  Procedure Laterality  Date  . APPENDECTOMY    . CESAREAN SECTION     x 3, last one with triplets  . TUBAL LIGATION       Family History  Problem Relation Age of Onset  . Cancer Mother        breast  . Heart disease Mother   . Stroke Mother   . Diabetes Mother   . Diabetes Father   . Breast cancer Maternal Aunt      Social History   Substance and Sexual Activity  Drug Use No  ,  Social History   Substance and Sexual Activity  Alcohol Use Yes   Comment: occ  ,  Social History   Tobacco Use  Smoking Status Former Smoker  . Packs/day: 1.00  . Years: 15.00  . Pack years: 15.00  . Types: Cigarettes  . Last attempt to quit: 03/04/2004  . Years since quitting: 13.9  Smokeless Tobacco Never Used  ,    Current Outpatient Medications on File Prior to Visit  Medication Sig Dispense Refill  . ALPRAZolam (XANAX) 0.5 MG tablet Take 0.5 tablets (0.25 mg total) by mouth as needed (only for panic attacks). 30 tablet 0  . buPROPion (WELLBUTRIN XL) 300 MG 24 hr tablet Take 1 tablet (300 mg total) by mouth daily. 90 tablet 1  . fluticasone (FLONASE) 50 MCG/ACT nasal spray 1 spray each nostril after sinus rinse twice daily 16 g 2  . metFORMIN (GLUCOPHAGE-XR) 500 MG 24 hr tablet Take 4 tablets (2,000 mg total) by mouth daily with breakfast. 360 tablet 1  . ondansetron (ZOFRAN-ODT) 8 MG disintegrating tablet Take 1 tablet (8 mg total) by mouth every 8 (eight) hours as needed for nausea. 20 tablet 3  . Semaglutide (OZEMPIC) 1 MG/DOSE SOPN Inject 1 mg into the skin once a week. 12 pen 3  . Vitamin D, Ergocalciferol, (DRISDOL) 50000 units CAPS capsule Please take 1 tablet Wednesday and 1 tablet Sunday every week 24 capsule 3   No current facility-administered medications on file prior to visit.      Allergies  Allergen Reactions  . Lantus [Insulin Glargine] Other (See Comments)    headaches     Review of Systems:   General:  Denies fever, chills Optho/Auditory:   Denies visual changes, blurred  vision Respiratory:   Denies SOB, cough, wheeze, DIB  Cardiovascular:   Denies chest pain, palpitations, painful respirations Gastrointestinal:   Denies nausea, vomiting, diarrhea.  Endocrine:     Denies new hot or cold intolerance Musculoskeletal:  Denies joint swelling, gait issues, or new unexplained myalgias/ arthralgias Skin:  Denies rash, suspicious lesions  Neurological:    Denies dizziness, unexplained weakness, numbness  Psychiatric/Behavioral:   Denies mood changes    Objective:     Blood pressure 102/72, pulse (!) 114, height 5\' 8"  (1.727 m), weight 167 lb (75.8 kg), SpO2 98 %.  Body mass index is 25.39 kg/m.  General: Well Developed, well nourished, and in no acute distress.  HEENT: Normocephalic, atraumatic, pupils equal round reactive to light, neck supple, No carotid bruits, no JVD Skin: Warm and dry, cap RF less 2 sec Cardiac: Regular rate and rhythm, S1, S2 WNL's, no murmurs rubs or gallops Respiratory: ECTA B/L, Not using accessory muscles, speaking in full sentences. NeuroM-Sk: Ambulates w/o assistance, moves ext * 4 w/o difficulty, sensation grossly intact.  Ext: scant edema b/l lower ext Psych: No HI/SI, judgement and insight good, Euthymic mood. Full Affect.

## 2018-01-29 ENCOUNTER — Telehealth: Payer: Self-pay | Admitting: Obstetrics & Gynecology

## 2018-01-29 NOTE — Telephone Encounter (Signed)
Call placed to patient on 01/28/18. Spoke with patient in regards to benefits for recommended surgery. Patient understood and agreeable. Patient aware this is professional benefit only. Patient aware will be contacted by hospital for separate benefits. Patient advises she will be starting a new job in August with the [employer] [place], but believes she will want to move forward with current coverage. Patient advises she will call back to advise how she would like to proceed

## 2018-02-02 ENCOUNTER — Encounter: Payer: Self-pay | Admitting: Obstetrics & Gynecology

## 2018-02-02 ENCOUNTER — Telehealth: Payer: Self-pay | Admitting: Obstetrics & Gynecology

## 2018-02-02 NOTE — Telephone Encounter (Signed)
Ok to repeat PUS this week or before surgery is scheduled to ensure it is still present.  I think it will be but ok to repeat this if desired.

## 2018-02-02 NOTE — Telephone Encounter (Signed)
Spoke with patient. Patient states she is traveling 8/7-8/15 and would like to schedule surgery around 8/20, if needed after reviewing PUS results with Dr. 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 Patient scheduled for PUS on 02-03-18 at 1600. Patient agreeable to date and time of appointment.  Routing to provider for final review. Patient agreeable to disposition. Will close encounter.

## 2018-02-02 NOTE — Telephone Encounter (Signed)
Message left to return call to Clairissa Valvano at [phone].    

## 2018-02-02 NOTE — Telephone Encounter (Signed)
Call to patient. Patient states that her period started on 02-01-18. States her "cramping pain" started on Friday and has been "a little more than normal" in her lower right abdomen. Pain started at a 7/10, and Saturday was a 5/10. States she took 400 mg of ibuprofen twice over the weekend and "that helped." States her pain is a 3/10 today and is tolerable. States her bleeding "started strange and there was a lot of tissue." Has changed her pad twice today. States flow is "cranberry in color." Denies fever. Also complaining of breast tenderness. Patient wanted to give update as she is unsure if this is just all related to her period or has something to do with the cyst. Patient states, "I don't want to have surgery if the cyst is gone." RN advised would review with Dr. Sabra Heck and return call with additional recommendations. Patient agreeable. Bleeding/ pain precautions reviewed with patient and she verbalized understanding.   Routing to provider for review.

## 2018-02-02 NOTE — Telephone Encounter (Signed)
Message   ----- Message from [place], Generic sent at 02/02/2018 1:32 PM EDT -----    I've been in a bit of pain over the weekend and started my period. At least I think it's my period. Is it possible for this cyst to rupture on its own? I'm wondering if that is what has happened. I suppose the next few days will tell. If yes, would we still do the surgery to remove the ovary? How will we know? Etc.    Thanks    [employer]

## 2018-02-02 NOTE — Telephone Encounter (Signed)
Patient is ready to proceed with scheduling surgery. Patient has called and confirmed she is ready to proceed with scheduling surgery.  Cc: Lindsey Mason

## 2018-02-03 ENCOUNTER — Other Ambulatory Visit: Payer: Self-pay | Admitting: *Deleted

## 2018-02-03 ENCOUNTER — Ambulatory Visit: Payer: BLUE CROSS/BLUE SHIELD | Admitting: Obstetrics & Gynecology

## 2018-02-03 ENCOUNTER — Ambulatory Visit (INDEPENDENT_AMBULATORY_CARE_PROVIDER_SITE_OTHER): Payer: BLUE CROSS/BLUE SHIELD

## 2018-02-03 VITALS — BP 120/80 | HR 80 | Resp 16 | Ht 68.0 in | Wt 167.0 lb

## 2018-02-03 DIAGNOSIS — D271 Benign neoplasm of left ovary: Secondary | ICD-10-CM

## 2018-02-03 DIAGNOSIS — N83201 Unspecified ovarian cyst, right side: Secondary | ICD-10-CM

## 2018-02-03 DIAGNOSIS — R1031 Right lower quadrant pain: Secondary | ICD-10-CM

## 2018-02-03 NOTE — Progress Notes (Signed)
47 y.o. G25P2105 Married Caucasian female here for pelvic ultrasound to recheck ovary.  Pt had atypical cycle with atypical pain and thought maybe the cyst had resolved.  Desired to repeat the ultrasound prior to scheduling surgery.  Pain and bleeding has resolved.    Patient's last menstrual period was 02/01/2018.  Contraception: BTL  Findings:  UTERUS: 8.7 x 4.7 x 4.4cm EMS: 5.12mm ADNEXA: Left ovary:  2.2 x 1.0 x 1.1cm       Right ovary: which is mostly cystic measuring 5.1 x 4.2 x 4.9cm, avascular, echofree with smooth margins.  A second cyst seen on imaging 01/22/18 not seen today CUL DE SAC: no free fluid noted  Discussion:  Pt aware cyst has not resolved and is ready to proceed with scheduling..  Assessment:  Persistent right ovarian cyst.   Plan:  She will see surgery scheduler today to proceed with scheduling.  Pt does have limited time schedule so encouraged to get a date set if possible

## 2018-02-03 NOTE — Telephone Encounter (Signed)
Spoke to patient yesterday afternoon approximately 340 pm and discussed surgery date options. Has limited availability due to start of school and stated she wanted to hold off on surgery. Discussed options of pelvic ultrasound to assess cyst and make plan with Dr Sabra Heck. Appointment for pelvic ultrasound scheduled for 02-03-18.   Routing to provider for review.  Will close encounter.

## 2018-02-03 NOTE — Telephone Encounter (Signed)
See previous phone note. Patient has confirmed and is ready to proceed with recommended surgery. Chart forwarded to Nurse Supervisor on 02/02/18 to review for scheduling.  Forwarded to Lamont Snowball, RN

## 2018-02-04 ENCOUNTER — Telehealth: Payer: Self-pay | Admitting: *Deleted

## 2018-02-04 NOTE — Telephone Encounter (Signed)
Call from patient. Confirmed surgery date and time. Surgery instruction sheet reviewed and printed copy will be mailed to patient. Denies questions.   Routing to provider for final review. Will close encounter.

## 2018-02-04 NOTE — Telephone Encounter (Signed)
Call to patient to confirm surgery information.  Surgery is scheduled for 02-16-18 at 1030 at [facility] as patient requested.  Left message on voice mail ( per DPR) to call bacl to review surgery instructions.

## 2018-02-04 NOTE — Telephone Encounter (Signed)
Return call to patient. Left message to call back. 

## 2018-02-04 NOTE — Telephone Encounter (Signed)
Patient is returning a call to Sally. °

## 2018-02-05 ENCOUNTER — Telehealth: Payer: Self-pay | Admitting: Obstetrics & Gynecology

## 2018-02-05 NOTE — Telephone Encounter (Signed)
Patient says she is returning a call to [place].

## 2018-02-06 ENCOUNTER — Encounter: Payer: Self-pay | Admitting: Obstetrics & Gynecology

## 2018-02-06 ENCOUNTER — Encounter (HOSPITAL_BASED_OUTPATIENT_CLINIC_OR_DEPARTMENT_OTHER): Payer: Self-pay

## 2018-02-06 ENCOUNTER — Other Ambulatory Visit: Payer: Self-pay | Admitting: Obstetrics & Gynecology

## 2018-02-06 DIAGNOSIS — E119 Type 2 diabetes mellitus without complications: Secondary | ICD-10-CM

## 2018-02-06 NOTE — Telephone Encounter (Signed)
Return call to patient. Left message to call back.  Suzy in business office actually called patient for insurance benefits.  All surgery information with me has been completed.

## 2018-02-09 NOTE — Telephone Encounter (Signed)
Returned call to patient. Left voicemail message requesting a return call °

## 2018-02-10 NOTE — Telephone Encounter (Signed)
Patient returned call. Re-verified benefits for scheduled surgery. Patient had no further questions. Ok to close

## 2018-02-11 ENCOUNTER — Other Ambulatory Visit: Payer: Self-pay

## 2018-02-11 ENCOUNTER — Encounter (HOSPITAL_BASED_OUTPATIENT_CLINIC_OR_DEPARTMENT_OTHER): Payer: Self-pay | Admitting: *Deleted

## 2018-02-11 NOTE — Progress Notes (Signed)
Patient to arrive at 0830. Needs BMET and CBC drawn DOS because she is out of town until Sunday. Also needs UPT. NPO after MN will take zantac with a sip of water AM of surgery.

## 2018-02-16 ENCOUNTER — Encounter (HOSPITAL_BASED_OUTPATIENT_CLINIC_OR_DEPARTMENT_OTHER)
Admission: RE | Disposition: A | Discharge status: Home / Self Care | Payer: Self-pay | Source: Ambulatory Visit | Attending: Obstetrics & Gynecology

## 2018-02-16 ENCOUNTER — Other Ambulatory Visit: Payer: Self-pay

## 2018-02-16 ENCOUNTER — Ambulatory Visit (HOSPITAL_BASED_OUTPATIENT_CLINIC_OR_DEPARTMENT_OTHER): Payer: BLUE CROSS/BLUE SHIELD | Admitting: Anesthesiology

## 2018-02-16 ENCOUNTER — Encounter (HOSPITAL_BASED_OUTPATIENT_CLINIC_OR_DEPARTMENT_OTHER): Payer: Self-pay | Admitting: Anesthesiology

## 2018-02-16 ENCOUNTER — Ambulatory Visit (HOSPITAL_BASED_OUTPATIENT_CLINIC_OR_DEPARTMENT_OTHER)
Admission: RE | Admit: 2018-02-16 | Discharge: 2018-02-16 | Disposition: A | Discharge status: Home / Self Care | Payer: BLUE CROSS/BLUE SHIELD | Source: Ambulatory Visit | Attending: Obstetrics & Gynecology | Admitting: Obstetrics & Gynecology

## 2018-02-16 DIAGNOSIS — E119 Type 2 diabetes mellitus without complications: Secondary | ICD-10-CM | POA: Insufficient documentation

## 2018-02-16 DIAGNOSIS — Z9851 Tubal ligation status: Secondary | ICD-10-CM | POA: Insufficient documentation

## 2018-02-16 DIAGNOSIS — N838 Other noninflammatory disorders of ovary, fallopian tube and broad ligament: Secondary | ICD-10-CM | POA: Diagnosis not present

## 2018-02-16 DIAGNOSIS — N83201 Unspecified ovarian cyst, right side: Secondary | ICD-10-CM | POA: Diagnosis present

## 2018-02-16 DIAGNOSIS — Z7984 Long term (current) use of oral hypoglycemic drugs: Secondary | ICD-10-CM | POA: Diagnosis not present

## 2018-02-16 DIAGNOSIS — F329 Major depressive disorder, single episode, unspecified: Secondary | ICD-10-CM | POA: Diagnosis not present

## 2018-02-16 DIAGNOSIS — Z79899 Other long term (current) drug therapy: Secondary | ICD-10-CM | POA: Insufficient documentation

## 2018-02-16 DIAGNOSIS — K219 Gastro-esophageal reflux disease without esophagitis: Secondary | ICD-10-CM | POA: Insufficient documentation

## 2018-02-16 DIAGNOSIS — Z7951 Long term (current) use of inhaled steroids: Secondary | ICD-10-CM | POA: Insufficient documentation

## 2018-02-16 DIAGNOSIS — Z87891 Personal history of nicotine dependence: Secondary | ICD-10-CM | POA: Diagnosis not present

## 2018-02-16 DIAGNOSIS — R1031 Right lower quadrant pain: Secondary | ICD-10-CM | POA: Diagnosis not present

## 2018-02-16 DIAGNOSIS — N83291 Other ovarian cyst, right side: Secondary | ICD-10-CM | POA: Diagnosis not present

## 2018-02-16 HISTORY — DX: Unspecified ovarian cyst, unspecified side: N83.209

## 2018-02-16 HISTORY — PX: LAPAROSCOPIC BILATERAL SALPINGECTOMY: SHX5889

## 2018-02-16 LAB — POCT I-STAT, CHEM 8
BUN: 10 mg/dL (ref 6–20)
CALCIUM ION: 1.27 mmol/L (ref 1.15–1.40)
CHLORIDE: 103 mmol/L (ref 98–111)
Creatinine, Ser: 0.4 mg/dL — ABNORMAL LOW (ref 0.44–1.00)
Glucose, Bld: 150 mg/dL — ABNORMAL HIGH (ref 70–99)
HEMATOCRIT: 41 % (ref 36.0–46.0)
Hemoglobin: 13.9 g/dL (ref 12.0–15.0)
Potassium: 4.1 mmol/L (ref 3.5–5.1)
SODIUM: 140 mmol/L (ref 135–145)
TCO2: 23 mmol/L (ref 22–32)

## 2018-02-16 LAB — POCT PREGNANCY, URINE: Preg Test, Ur: NEGATIVE

## 2018-02-16 LAB — GLUCOSE, CAPILLARY: GLUCOSE-CAPILLARY: 122 mg/dL — AB (ref 70–99)

## 2018-02-16 SURGERY — SALPINGECTOMY, BILATERAL, LAPAROSCOPIC
Anesthesia: General | Site: Abdomen | Laterality: Right

## 2018-02-16 MED ORDER — FENTANYL CITRATE (PF) 100 MCG/2ML IJ SOLN
INTRAMUSCULAR | Status: DC | PRN
  Administered 2018-02-16 (×4): 25 ug via INTRAVENOUS
  Administered 2018-02-16 (×2): 50 ug via INTRAVENOUS

## 2018-02-16 MED ORDER — BUPIVACAINE HCL 0.25 % IJ SOLN
INTRAMUSCULAR | Status: DC | PRN
  Administered 2018-02-16: 13 mL

## 2018-02-16 MED ORDER — ACETAMINOPHEN 10 MG/ML IV SOLN
INTRAVENOUS | Status: DC | PRN
  Administered 2018-02-16: 1000 mg via INTRAVENOUS

## 2018-02-16 MED ORDER — PHENYLEPHRINE 40 MCG/ML (10ML) SYRINGE FOR IV PUSH (FOR BLOOD PRESSURE SUPPORT)
PREFILLED_SYRINGE | INTRAVENOUS | Status: DC | PRN
  Administered 2018-02-16: 120 ug via INTRAVENOUS
  Administered 2018-02-16: 40 ug via INTRAVENOUS
  Administered 2018-02-16: 80 ug via INTRAVENOUS

## 2018-02-16 MED ORDER — KETOROLAC TROMETHAMINE 30 MG/ML IJ SOLN
INTRAMUSCULAR | Status: AC
  Filled 2018-02-16: qty 1

## 2018-02-16 MED ORDER — ROCURONIUM BROMIDE 100 MG/10ML IV SOLN
INTRAVENOUS | Status: AC
Start: 2018-02-16 — End: ?
  Filled 2018-02-16: qty 1

## 2018-02-16 MED ORDER — KETOROLAC TROMETHAMINE 30 MG/ML IJ SOLN
30.0000 mg | Freq: Once | INTRAMUSCULAR | Status: DC | PRN
  Filled 2018-02-16: qty 1

## 2018-02-16 MED ORDER — MIDAZOLAM HCL 2 MG/2ML IJ SOLN
INTRAMUSCULAR | Status: AC
  Filled 2018-02-16: qty 2

## 2018-02-16 MED ORDER — FENTANYL CITRATE (PF) 100 MCG/2ML IJ SOLN
INTRAMUSCULAR | Status: AC
  Filled 2018-02-16: qty 2

## 2018-02-16 MED ORDER — SCOPOLAMINE 1 MG/3DAYS TD PT72
1.0000 | MEDICATED_PATCH | TRANSDERMAL | Status: DC
  Administered 2018-02-16: 1.5 mg via TRANSDERMAL
  Filled 2018-02-16: qty 1

## 2018-02-16 MED ORDER — OXYCODONE HCL 5 MG PO TABS
5.0000 mg | ORAL_TABLET | Freq: Once | ORAL | Status: AC | PRN
  Administered 2018-02-16: 5 mg via ORAL
  Filled 2018-02-16: qty 1

## 2018-02-16 MED ORDER — METOCLOPRAMIDE HCL 5 MG/ML IJ SOLN
INTRAMUSCULAR | Status: AC
  Filled 2018-02-16: qty 2

## 2018-02-16 MED ORDER — LIDOCAINE 2% (20 MG/ML) 5 ML SYRINGE
INTRAMUSCULAR | Status: DC | PRN
  Administered 2018-02-16: 100 mg via INTRAVENOUS

## 2018-02-16 MED ORDER — PROPOFOL 10 MG/ML IV BOLUS
INTRAVENOUS | Status: DC | PRN
  Administered 2018-02-16: 150 mg via INTRAVENOUS

## 2018-02-16 MED ORDER — SCOPOLAMINE 1 MG/3DAYS TD PT72
MEDICATED_PATCH | TRANSDERMAL | Status: AC
  Filled 2018-02-16: qty 1

## 2018-02-16 MED ORDER — MEPERIDINE HCL 25 MG/ML IJ SOLN
6.2500 mg | INTRAMUSCULAR | Status: DC | PRN
  Filled 2018-02-16: qty 1

## 2018-02-16 MED ORDER — LIDOCAINE 2% (20 MG/ML) 5 ML SYRINGE
INTRAMUSCULAR | Status: AC
  Filled 2018-02-16: qty 5

## 2018-02-16 MED ORDER — METOCLOPRAMIDE HCL 5 MG/ML IJ SOLN
INTRAMUSCULAR | Status: DC | PRN
  Administered 2018-02-16: 10 mg via INTRAVENOUS

## 2018-02-16 MED ORDER — FENTANYL CITRATE (PF) 100 MCG/2ML IJ SOLN
25.0000 ug | INTRAMUSCULAR | Status: DC | PRN
  Administered 2018-02-16: 25 ug via INTRAVENOUS
  Filled 2018-02-16: qty 1

## 2018-02-16 MED ORDER — DEXAMETHASONE SODIUM PHOSPHATE 10 MG/ML IJ SOLN
INTRAMUSCULAR | Status: AC
  Filled 2018-02-16: qty 1

## 2018-02-16 MED ORDER — ROCURONIUM BROMIDE 10 MG/ML (PF) SYRINGE
PREFILLED_SYRINGE | INTRAVENOUS | Status: DC | PRN
  Administered 2018-02-16: 15 mg via INTRAVENOUS
  Administered 2018-02-16: 35 mg via INTRAVENOUS

## 2018-02-16 MED ORDER — SILVER NITRATE-POT NITRATE 75-25 % EX MISC
CUTANEOUS | Status: DC | PRN
  Administered 2018-02-16: 4

## 2018-02-16 MED ORDER — ONDANSETRON HCL 4 MG/2ML IJ SOLN
4.0000 mg | Freq: Once | INTRAMUSCULAR | Status: DC | PRN
  Filled 2018-02-16: qty 2

## 2018-02-16 MED ORDER — OXYCODONE HCL 5 MG/5ML PO SOLN
5.0000 mg | Freq: Once | ORAL | Status: AC | PRN
  Filled 2018-02-16: qty 5

## 2018-02-16 MED ORDER — HYDROCODONE-ACETAMINOPHEN 5-325 MG PO TABS: 1.0000 | ORAL_TABLET | Freq: Four times a day (QID) | ORAL | 0 refills | Status: AC | PRN

## 2018-02-16 MED ORDER — OXYCODONE HCL 5 MG PO TABS
ORAL_TABLET | ORAL | Status: AC
  Filled 2018-02-16: qty 1

## 2018-02-16 MED ORDER — IBUPROFEN 100 MG/5ML PO SUSP
200.0000 mg | Freq: Four times a day (QID) | ORAL | Status: DC | PRN
  Filled 2018-02-16: qty 30

## 2018-02-16 MED ORDER — MIDAZOLAM HCL 2 MG/2ML IJ SOLN
INTRAMUSCULAR | Status: DC | PRN
  Administered 2018-02-16: 2 mg via INTRAVENOUS

## 2018-02-16 MED ORDER — SUGAMMADEX SODIUM 200 MG/2ML IV SOLN
INTRAVENOUS | Status: AC
  Filled 2018-02-16: qty 2

## 2018-02-16 MED ORDER — DEXAMETHASONE SODIUM PHOSPHATE 10 MG/ML IJ SOLN
INTRAMUSCULAR | Status: DC | PRN
  Administered 2018-02-16: 5 mg via INTRAVENOUS

## 2018-02-16 MED ORDER — LACTATED RINGERS IV SOLN
INTRAVENOUS | Status: DC
  Administered 2018-02-16 (×2): via INTRAVENOUS
  Filled 2018-02-16: qty 1000

## 2018-02-16 MED ORDER — PROPOFOL 10 MG/ML IV BOLUS
INTRAVENOUS | Status: AC
  Filled 2018-02-16: qty 20

## 2018-02-16 MED ORDER — ONDANSETRON HCL 4 MG/2ML IJ SOLN
INTRAMUSCULAR | Status: DC | PRN
  Administered 2018-02-16: 4 mg via INTRAVENOUS

## 2018-02-16 MED ORDER — IBUPROFEN 800 MG PO TABS: 800.0000 mg | ORAL_TABLET | Freq: Three times a day (TID) | ORAL | 0 refills | Status: DC | PRN

## 2018-02-16 MED ORDER — PHENYLEPHRINE 40 MCG/ML (10ML) SYRINGE FOR IV PUSH (FOR BLOOD PRESSURE SUPPORT)
PREFILLED_SYRINGE | INTRAVENOUS | Status: AC
  Filled 2018-02-16: qty 10

## 2018-02-16 MED ORDER — SUGAMMADEX SODIUM 200 MG/2ML IV SOLN
INTRAVENOUS | Status: DC | PRN
  Administered 2018-02-16: 150 mg via INTRAVENOUS

## 2018-02-16 MED ORDER — ONDANSETRON HCL 4 MG/2ML IJ SOLN
INTRAMUSCULAR | Status: AC
  Filled 2018-02-16: qty 2

## 2018-02-16 MED ORDER — IBUPROFEN 200 MG PO TABS
200.0000 mg | ORAL_TABLET | Freq: Four times a day (QID) | ORAL | Status: DC | PRN
  Filled 2018-02-16: qty 2

## 2018-02-16 MED ORDER — KETOROLAC TROMETHAMINE 30 MG/ML IJ SOLN
INTRAMUSCULAR | Status: DC | PRN
  Administered 2018-02-16: 30 mg via INTRAVENOUS

## 2018-02-16 SURGICAL SUPPLY — 58 items
APPLICATOR ARISTA FLEXITIP XL (MISCELLANEOUS) IMPLANT
BENZOIN TINCTURE PRP APPL 2/3 (GAUZE/BANDAGES/DRESSINGS) IMPLANT
BLADE CLIPPER SURG (BLADE) IMPLANT
CABLE HIGH FREQUENCY MONO STRZ (ELECTRODE) ×3 IMPLANT
CANISTER SUCT 3000ML PPV (MISCELLANEOUS) IMPLANT
CANISTER SUCTION 1200CC (MISCELLANEOUS) IMPLANT
CATH ROBINSON RED A/P 16FR (CATHETERS) ×3 IMPLANT
DECANTER SPIKE VIAL GLASS SM (MISCELLANEOUS) IMPLANT
DERMABOND ADVANCED (GAUZE/BANDAGES/DRESSINGS) ×1
DERMABOND ADVANCED .7 DNX12 (GAUZE/BANDAGES/DRESSINGS) ×2 IMPLANT
DRSG COVADERM PLUS 2X2 (GAUZE/BANDAGES/DRESSINGS) IMPLANT
DRSG OPSITE POSTOP 3X4 (GAUZE/BANDAGES/DRESSINGS) IMPLANT
DRSG TELFA 3X8 NADH (GAUZE/BANDAGES/DRESSINGS) IMPLANT
DURAPREP 26ML APPLICATOR (WOUND CARE) ×3 IMPLANT
GLOVE BIOGEL PI IND STRL 7.0 (GLOVE) ×4 IMPLANT
GLOVE BIOGEL PI INDICATOR 7.0 (GLOVE) ×2
GLOVE ECLIPSE 6.5 STRL STRAW (GLOVE) ×6 IMPLANT
GLOVE INDICATOR 7.0 STRL GRN (GLOVE) ×3 IMPLANT
GOWN STRL REUS W/TWL LRG LVL3 (GOWN DISPOSABLE) ×3 IMPLANT
HEMOSTAT ARISTA ABSORB 3G PWDR (MISCELLANEOUS) IMPLANT
KIT TURNOVER CYSTO (KITS) ×3 IMPLANT
LIGASURE VESSEL 5MM BLUNT TIP (ELECTROSURGICAL) ×3 IMPLANT
NEEDLE HYPO 25X1 1.5 SAFETY (NEEDLE) IMPLANT
NEEDLE INSUFFLATION 120MM (ENDOMECHANICALS) ×3 IMPLANT
NEEDLE INSUFFLATION 14GA 150MM (NEEDLE) IMPLANT
NS IRRIG 500ML POUR BTL (IV SOLUTION) ×3 IMPLANT
PACK LAPAROSCOPY BASIN (CUSTOM PROCEDURE TRAY) ×3 IMPLANT
PACK TRENDGUARD 450 HYBRID PRO (MISCELLANEOUS) ×2 IMPLANT
PAD OB MATERNITY 4.3X12.25 (PERSONAL CARE ITEMS) ×3 IMPLANT
POUCH LAPAROSCOPIC INSTRUMENT (MISCELLANEOUS) ×3 IMPLANT
PROTECTOR NERVE ULNAR (MISCELLANEOUS) ×3 IMPLANT
SCISSORS LAP 5X35 DISP (ENDOMECHANICALS) IMPLANT
SEALER TISSUE G2 CVD JAW 35 (ENDOMECHANICALS) IMPLANT
SEALER TISSUE G2 CVD JAW 45CM (ENDOMECHANICALS)
SET IRRIG TUBING LAPAROSCOPIC (IRRIGATION / IRRIGATOR) IMPLANT
SHEARS HARMONIC ACE PLUS 36CM (ENDOMECHANICALS) ×3 IMPLANT
SLEEVE ADV FIXATION 5X100MM (TROCAR) ×3 IMPLANT
SOLUTION ELECTROLUBE (MISCELLANEOUS) IMPLANT
STRIP CLOSURE SKIN 1/4X4 (GAUZE/BANDAGES/DRESSINGS) IMPLANT
SUT VIC AB 4-0 SH 27 (SUTURE)
SUT VIC AB 4-0 SH 27XANBCTRL (SUTURE) IMPLANT
SUT VICRYL 0 UR6 27IN ABS (SUTURE) IMPLANT
SUT VICRYL 4-0 PS2 18IN ABS (SUTURE) ×3 IMPLANT
SYR 10ML LL (SYRINGE) ×3 IMPLANT
SYR 30ML LL (SYRINGE) IMPLANT
SYR 3ML 23GX1 SAFETY (SYRINGE) IMPLANT
SYS BAG RETRIEVAL 10MM (BASKET)
SYSTEM BAG RETRIEVAL 10MM (BASKET) IMPLANT
TOWEL OR 17X24 6PK STRL BLUE (TOWEL DISPOSABLE) ×6 IMPLANT
TRAY FOLEY W/BAG SLVR 14FR (SET/KITS/TRAYS/PACK) ×3 IMPLANT
TRENDGUARD 450 HYBRID PRO PACK (MISCELLANEOUS) ×3
TROCAR ADV FIXATION 5X100MM (TROCAR) ×3 IMPLANT
TROCAR XCEL NON-BLD 11X100MML (ENDOMECHANICALS) IMPLANT
TROCAR XCEL NON-BLD 5MMX100MML (ENDOMECHANICALS) ×3 IMPLANT
TUBE CONNECTING 12X1/4 (SUCTIONS) IMPLANT
TUBING INSUF HEATED (TUBING) ×3 IMPLANT
WARMER LAPAROSCOPE (MISCELLANEOUS) ×3 IMPLANT
WATER STERILE IRR 500ML POUR (IV SOLUTION) ×3 IMPLANT

## 2018-02-16 NOTE — Anesthesia Preprocedure Evaluation (Signed)
Anesthesia Evaluation  Patient identified by MRN, date of birth, ID band Patient awake    Reviewed: Allergy & Precautions, NPO status , Patient's Chart, lab work & pertinent test results  Airway Mallampati: I       Dental no notable dental hx. (+) Teeth Intact   Pulmonary former smoker,    Pulmonary exam normal breath sounds clear to auscultation       Cardiovascular negative cardio ROS Normal cardiovascular exam Rhythm:Regular Rate:Normal     Neuro/Psych PSYCHIATRIC DISORDERS Depression    GI/Hepatic GERD  Medicated,  Endo/Other  diabetes, Type 2, Oral Hypoglycemic Agents  Renal/GU      Musculoskeletal   Abdominal Normal abdominal exam  (+)   Peds  Hematology negative hematology ROS (+)   Anesthesia Other Findings   Reproductive/Obstetrics                             Anesthesia Physical Anesthesia Plan  ASA: II  Anesthesia Plan: General   Post-op Pain Management:    Induction: Intravenous  PONV Risk Score and Plan: 4 or greater and Ondansetron, Dexamethasone, Midazolam and Scopolamine patch - Pre-op  Airway Management Planned: Oral ETT  Additional Equipment:   Intra-op Plan:   Post-operative Plan: Extubation in OR  Informed Consent: I have reviewed the patients History and Physical, chart, labs and discussed the procedure including the risks, benefits and alternatives for the proposed anesthesia with the patient or authorized representative who has indicated his/her understanding and acceptance.     Plan Discussed with: CRNA and Surgeon  Anesthesia Plan Comments:         Anesthesia Quick Evaluation

## 2018-02-16 NOTE — Discharge Instructions (Signed)
°  Post Anesthesia Home Care Instructions  Activity: Get plenty of rest for the remainder of the day. A responsible adult should stay with you for 24 hours following the procedure.  For the next 24 hours, DO NOT: -Drive a car -Paediatric nurse -Drink alcoholic beverages -Take any medication unless instructed by your physician -Make any legal decisions or sign important papers.  Meals: Start with liquid foods such as gelatin or soup. Progress to regular foods as tolerated. Avoid greasy, spicy, heavy foods. If nausea and/or vomiting occur, drink only clear liquids until the nausea and/or vomiting subsides. Call your physician if vomiting continues.  Special Instructions/Symptoms: Your throat may feel dry or sore from the anesthesia or the breathing tube placed in your throat during surgery. If this causes discomfort, gargle with warm salt water. The discomfort should disappear within 24 hours.  If you had a scopolamine patch placed behind your ear for the management of post- operative nausea and/or vomiting:  1. The medication in the patch is effective for 72 hours, after which it should be removed.  Wrap patch in a tissue and discard in the trash. Wash hands thoroughly with soap and water. 2. You may remove the patch earlier than 72 hours if you experience unpleasant side effects which may include dry mouth, dizziness or visual disturbances. 3. Avoid touching the patch. Wash your hands with soap and water after contact with the patch.  NO ADVIL, ALEVE, MOTRIN, IBUPROFEN UNTIL 430 PM TODAY   HOME CARE INSTRUCTIONS - LAPAROSCOPY  Wound Care: The bandaids or dressing which are placed over the skin openings may be removed the day after surgery. The incision should be kept clean and dry. The stitches do not need to be removed. Should the incision become sore, red, and swollen after the first week, check with your doctor.  Personal Hygiene: Shower the day after your procedure. Always wipe from  front to back after elimination.   Activity: Do not drive or operate any equipment today. The effects of the anesthesia are still present and drowsiness may result. Rest today, not necessarily flat bed rest, just take it easy. You may resume your normal activity in one to three days or as instructed by your physician.  Sexual Activity: You resume sexual activity as indicated by your physician_________. If your laparoscopy was for a sterilization ( tubes tied ), continue current method of birth control until after your next period or ask for specific instructions from your doctor.  Diet: Eat a light diet as desired this evening. You may resume a regular diet tomorrow.  Return to Work: Two to three days or as indicated by your doctor.  Expectations After Surgery: Your surgery will cause vaginal drainage or spotting which may continue for 2-3 days. Mild abdominal discomfort or tenderness is not unusual and some shoulder pain may also be noted which can be relieved by lying flat in pain.  Call Your Doctor If these Occur:  Persistent or heavy bleeding at incision site       Redness or swelling around incision       Elevation of temperature greater than 100 degrees F  Call for follow-up appointment _____________.

## 2018-02-16 NOTE — Op Note (Signed)
02/16/2018  11:56 AM  PATIENT:  Lindsey Mason  47 y.o. female  PRE-OPERATIVE DIAGNOSIS:  right ovarian cyst  POST-OPERATIVE DIAGNOSIS:  Right paratubal cyst  PROCEDURE:  Procedure(s): LAPAROSCOPIC RIGHT SALPINGECTOMY  SURGEON:  Megan Salon  ASSISTANTS: Sumner Boast, MD   ANESTHESIA:   general  ESTIMATED BLOOD LOSS: 5 mL  BLOOD ADMINISTERED:none   FLUIDS: 1500cc LR  UOP: 150cc clear UOPD  SPECIMEN:  Left fallopian tube  DISPOSITION OF SPECIMEN:  PATHOLOGY  FINDINGS: 6cm right paratubal cyst, significant pelvic adhesions due to prior surgeries.  DESCRIPTION OF OPERATION: Patient is taken to the operating room. She is placed in the supine position. She is a running IV in place. Informed consent was present on the chart. SCDs on her lower extremities and functioning properly. Patient was positioned while she was awake.  Her legs were placed in the low lithotomy position in [place]. Her arms were tucked by the side.  General endotracheal anesthesia was administered by the anesthesia staff without difficulty. Dr. Jillyn Hidden, anesthesia, oversaw case.  Time out performed.    Chlora prep was then used to prep the abdomen and Betadine was used to prep the inner thighs, perineum and vagina. Once 3 minutes had past the patient was draped in a normal standard fashion. The legs were lifted to the high lithotomy position. The cervix was visualized by placing a bivalved graves speculum in the vagina.  The anterior lip of the cervix was grasped with single-tooth tenaculum.  A hulka calmp was passed through the cervix and then attached to the anterior lip of the cervix.  The tenaculum was removed. The speculum was removed. A Foley catheter was placed to straight drain.  Clear urine was noted. Legs were lowered to the low lithotomy position and attention was turned the abdomen.  Due to prior appendectomy scar that was across the lower portion of her abdomen in a non-standard location, LUQ  entry was performed.  Skin was anesthatized with 0.25% Marcaine and the skin was incised with a #11 blade.  Using a 14mm non-bladed Optiview trocar and port, these were passed directly to the abdomen. The laparoscope was then used to confirm intraperitoneal placement.  Pneumoperitoneum was then achieved.    There was a significant amount of adhesions present due to the prior apeendectomy and likely her cesarean sections as well.  However, the right tube and cyst were noted.  This was a paratubal cyst and not an ovarian cyst.    Locations for a left mid quadrant and LLQ port were were noted by transillumination of the abdominal wall.  0.25% marcaine was used to anesthetize the skin.  15mm skin incision were made.  Then 10mm nonbladed trochars and ports was placed in theses locations.    The right tube could be easily elevated and using the Ligasure device was dissected free from the right ovary.  Excellent hemostasis was present.    Due to the significant adhesions, decision was made not to proceed further.  The cyst was opened and drained as it was a paratubal cyst and then brought out through the LLQ port.    At this point the procedure was completed.  The instruments were removed.  The pneumoperitoneum was relieved.  The LUQ and left midline ports were removed.  The patient was taken out of Trendelenburg positioning.  Several deep breaths were given to the patient's trying to any gas the abdomen and finally the LLQ port was removed.  COUNTS:  YES  PLAN OF CARE: Transfer to PACU

## 2018-02-16 NOTE — H&P (Signed)
Lindsey Mason is an 47 y.o. female G42P5 MWF with h/o 5.9cm simple right ovarian cyst as well as a 2.5cm simple ovarian cyst on the right side.  The larger cyst has been present for several years and is slowly growing.  For about a year and a half she's had RLQ pulling sensation.  Ca-125 was normal.  Removal was recommended.  The only other option would be to continue to follow this conservatively.  She has decided to proceed with removal.  Risks, benefits have been removed.  She is here and ready to proceed.  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  Most recent HbA1c was 5.9.  She is followed by Dr. Raliegh Scarlet for this.  Pertinent Gynecological History: Menses: regular Bleeding: regular Contraception: tubal ligation DES exposure: denies Blood transfusions: none Sexually transmitted diseases: no past history Previous GYN Procedures: Cesarean sections x 3  Last mammogram: normal Date: 06/04/16 Last pap: normal Date: 01/22/18 OB History: G3, P5 (third pregnancy was with twins)   Menstrual History: Patient's last menstrual period was 02/01/2018 (exact date).    Past Medical History:  Diagnosis Date  . Clomid pregnancy 1997, 2005  . Depression   . Diabetes mellitus without mention of complication   . Esophageal reflux   . Headache(784.0)   . Infertility, female    PCOS - Clomid pregnancies   . Ovarian cyst 2019  . PCOS (polycystic ovarian syndrome) 05/27/2012  . Pure hypercholesterolemia   . Rosacea     Past Surgical History:  Procedure Laterality Date  . APPENDECTOMY    . CESAREAN SECTION     x 3, last one with triplets  . TUBAL LIGATION      Family History  Problem Relation Age of Onset  . Cancer Mother        breast  . Heart disease Mother   . Stroke Mother   . Diabetes Mother   . Diabetes Father   . Breast cancer Maternal Aunt     Social History:  reports that she quit smoking about 13 years ago. Her smoking use included cigarettes.  She has a 15.00 pack-year smoking history. She has never used smokeless tobacco. She reports that she drinks alcohol. She reports that she does not use drugs.  Allergies:  Allergies  Allergen Reactions  . Lantus [Insulin Glargine] Other (See Comments)    headaches    Medications Prior to Admission  Medication Sig Dispense Refill Last Dose  . buPROPion (WELLBUTRIN XL) 300 MG 24 hr tablet Take 1 tablet (300 mg total) by mouth daily. 90 tablet 1 02/15/2018 at Unknown time  . metFORMIN (GLUCOPHAGE-XR) 500 MG 24 hr tablet Take 4 tablets (2,000 mg total) by mouth daily with breakfast. (Patient taking differently: Take 2,000 mg by mouth every evening. ) 360 tablet 1 02/15/2018 at Unknown time  . ranitidine (ZANTAC) 300 MG tablet 1/2 tab p.o. twice daily 90 tablet 3 02/15/2018 at Unknown time  . Semaglutide (OZEMPIC) 1 MG/DOSE SOPN Inject 1 mg into the skin once a week. 12 pen 3 02/12/2018 at Unknown time  . Vitamin D, Ergocalciferol, (DRISDOL) 50000 units CAPS capsule Please take 1 tablet Wednesday and 1 tablet Sunday every week 24 capsule 3 02/11/2018 at Unknown time  . ALPRAZolam (XANAX) 0.5 MG tablet Take 0.5 tablets (0.25 mg total) by mouth as needed (only for panic attacks). 30 tablet 0 More than a month at Unknown time  . fluticasone (FLONASE) 50 MCG/ACT nasal spray  1 spray each nostril after sinus rinse twice daily 16 g 2 More than a month at Unknown time    Review of Systems  All other systems reviewed and are negative.   Blood pressure 122/85, pulse 91, temperature 99.3 F (37.4 C), temperature source Oral, resp. rate 18, height 5\' 6"  (1.676 m), weight 74.5 kg, last menstrual period 02/01/2018, SpO2 100 %. Physical Exam  Constitutional: She is oriented to person, place, and time. She appears well-developed and well-nourished.  Cardiovascular: Normal rate and regular rhythm.  Respiratory: Effort normal and breath sounds normal.  Neurological: She is alert and oriented to person, place, and  time.  Skin: Skin is warm and dry.  Psychiatric: She has a normal mood and affect.    Results for orders placed or performed during the hospital encounter of 02/16/18 (from the past 24 hour(s))  Pregnancy, urine POC     Status: None   Collection Time: 02/16/18  8:54 AM  Result Value Ref Range   Preg Test, Ur NEGATIVE NEGATIVE  I-STAT, chem 8     Status: Abnormal   Collection Time: 02/16/18  9:17 AM  Result Value Ref Range   Sodium 140 135 - 145 mmol/L   Potassium 4.1 3.5 - 5.1 mmol/L   Chloride 103 98 - 111 mmol/L   BUN 10 6 - 20 mg/dL   Creatinine, Ser 0.40 (L) 0.44 - 1.00 mg/dL   Glucose, Bld 150 (H) 70 - 99 mg/dL   Calcium, Ion 1.27 1.15 - 1.40 mmol/L   TCO2 23 22 - 32 mmol/L   Hemoglobin 13.9 12.0 - 15.0 g/dL   HCT 41.0 36.0 - 46.0 %    No results found.  Assessment/Plan: 47 yo G3P5 MWF with hx of 5.9cm right ovarian cyst that has increased in size and has been persistent for several years.  She is here for laparoscopic RSO with left salpingectomy if possible.  Risks, benefits, alternatives have been removed.  Questions answered.  Husband is with her today.  She is ready to proceed.  Megan Salon 02/16/2018, 9:46 AM

## 2018-02-16 NOTE — Anesthesia Procedure Notes (Signed)
Procedure Name: Intubation Date/Time: 02/16/2018 10:04 AM Performed by: Lyn Hollingshead, MD Pre-anesthesia Checklist: Patient identified, Emergency Drugs available, Suction available and Patient being monitored Patient Re-evaluated:Patient Re-evaluated prior to induction Oxygen Delivery Method: Circle system utilized Preoxygenation: Pre-oxygenation with 100% oxygen Induction Type: IV induction Ventilation: Mask ventilation without difficulty Laryngoscope Size: Mac and 3 Grade View: Grade I Tube type: Oral Tube size: 6.5 mm Number of attempts: 1 Airway Equipment and Method: Stylet and Oral airway Placement Confirmation: ETT inserted through vocal cords under direct vision,  positive ETCO2 and breath sounds checked- equal and bilateral Secured at: 21 cm Tube secured with: Tape Dental Injury: Teeth and Oropharynx as per pre-operative assessment  Comments: 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

## 2018-02-16 NOTE — Anesthesia Postprocedure Evaluation (Signed)
Anesthesia Post Note  Patient: Lindsey Mason  Procedure(s) Performed: LAPAROSCOPIC RIGHT SALPINGECTOMY (Right Abdomen)     Patient location during evaluation: PACU Anesthesia Type: General Level of consciousness: sedated Pain management: pain level controlled Vital Signs Assessment: post-procedure vital signs reviewed and stable Respiratory status: spontaneous breathing and respiratory function stable Cardiovascular status: stable Postop Assessment: no apparent nausea or vomiting Anesthetic complications: no    Last Vitals:  Vitals:   02/16/18 1215 02/16/18 1230  BP: 123/78 133/84  Pulse: 94 97  Resp: 12 12  Temp:    SpO2: 100% 100%    Last Pain:  Vitals:   02/16/18 1230  TempSrc:   PainSc: 5                  Jery Hollern DANIEL

## 2018-02-16 NOTE — Transfer of Care (Signed)
  Last Vitals:  Vitals Value Taken Time  BP 116/75 02/16/2018 11:32 AM  Temp 36.4 C 02/16/2018 11:32 AM  Pulse 92 02/16/2018 11:38 AM  Resp 13 02/16/2018 11:38 AM  SpO2 100 % 02/16/2018 11:38 AM  Vitals shown include unvalidated device data.  Last Pain:  Vitals:   02/16/18 0905  TempSrc:   PainSc: 0-No pain      Patients Stated Pain Goal: 6 (02/16/18 0905)  Immediate Anesthesia Transfer of Care Note  Patient: Lindsey Mason  Procedure(s) Performed: Procedure(s) (LRB): LAPAROSCOPIC RIGHT SALPINGECTOMY (Right)  Patient Location: PACU  Anesthesia Type: General  Level of Consciousness: awake, alert  and oriented  Airway & Oxygen Therapy: Patient Spontanous Breathing and Patient connected to nasal cannula oxygen  Post-op Assessment: Report given to PACU RN and Post -op Vital signs reviewed and stable  Post vital signs: Reviewed and stable  Complications: No apparent anesthesia complications

## 2018-02-17 ENCOUNTER — Encounter (HOSPITAL_BASED_OUTPATIENT_CLINIC_OR_DEPARTMENT_OTHER): Payer: Self-pay | Admitting: Obstetrics & Gynecology

## 2018-03-03 ENCOUNTER — Encounter: Payer: Self-pay | Admitting: Obstetrics & Gynecology

## 2018-03-03 ENCOUNTER — Telehealth: Payer: Self-pay | Admitting: Obstetrics & Gynecology

## 2018-03-03 ENCOUNTER — Ambulatory Visit: Payer: BLUE CROSS/BLUE SHIELD | Admitting: Obstetrics & Gynecology

## 2018-03-03 NOTE — Telephone Encounter (Signed)
Patient dnka her 2 week post op appointment today. I left a message for her to please call and reschedule.

## 2018-03-03 NOTE — Progress Notes (Deleted)
Post Operative Visit  Procedure: Laparoscopic Right Salpingectomy  Days Post-op: 15  Subjective: ***  Objective: LMP 02/01/2018 (Exact Date)   EXAM General: {Exam; general:16600} Resp: {Exam; lung:16931} Cardio: {Exam; heart:5510} GI: {Exam, HM:0947096} Extremities: {Exam; extremity:5109} Vaginal Bleeding: {exam; vaginal bleeding:3041122}  Assessment: s/p ***  Plan: Recheck {NUMBER 1-10:22536} weeks ***

## 2018-03-04 NOTE — Telephone Encounter (Signed)
I left her a message as well yesterday for her to please call and give an update about how she is feeling. Thanks.

## 2018-03-09 ENCOUNTER — Other Ambulatory Visit (INDEPENDENT_AMBULATORY_CARE_PROVIDER_SITE_OTHER): Payer: BLUE CROSS/BLUE SHIELD

## 2018-03-09 ENCOUNTER — Ambulatory Visit: Payer: BLUE CROSS/BLUE SHIELD | Admitting: Gastroenterology

## 2018-03-09 ENCOUNTER — Encounter: Payer: Self-pay | Admitting: Gastroenterology

## 2018-03-09 VITALS — BP 110/72 | HR 80 | Ht 66.0 in | Wt 167.0 lb

## 2018-03-09 DIAGNOSIS — R140 Abdominal distension (gaseous): Secondary | ICD-10-CM

## 2018-03-09 DIAGNOSIS — R12 Heartburn: Secondary | ICD-10-CM

## 2018-03-09 DIAGNOSIS — R198 Other specified symptoms and signs involving the digestive system and abdomen: Secondary | ICD-10-CM

## 2018-03-09 DIAGNOSIS — K8020 Calculus of gallbladder without cholecystitis without obstruction: Secondary | ICD-10-CM

## 2018-03-09 DIAGNOSIS — R932 Abnormal findings on diagnostic imaging of liver and biliary tract: Secondary | ICD-10-CM | POA: Diagnosis not present

## 2018-03-09 LAB — HEPATIC FUNCTION PANEL
ALBUMIN: 4.3 g/dL (ref 3.5–5.2)
ALK PHOS: 59 U/L (ref 39–117)
ALT: 12 U/L (ref 0–35)
AST: 8 U/L (ref 0–37)
Bilirubin, Direct: 0.1 mg/dL (ref 0.0–0.3)
TOTAL PROTEIN: 6.9 g/dL (ref 6.0–8.3)
Total Bilirubin: 0.5 mg/dL (ref 0.2–1.2)

## 2018-03-09 LAB — HIGH SENSITIVITY CRP: CRP HIGH SENSITIVITY: 2.17 mg/L (ref 0.000–5.000)

## 2018-03-09 LAB — IGA: IgA: 127 mg/dL (ref 68–378)

## 2018-03-09 LAB — TSH: TSH: 0.63 u[IU]/mL (ref 0.35–4.50)

## 2018-03-09 NOTE — Patient Instructions (Addendum)
Normal BMI (Body Mass Index- based on height and weight) is between 19 and 25. Your BMI today is Body mass index is 26.95 kg/m. [name] Please consider follow up  regarding your BMI with your Primary Care Provider.  Your provider has requested that you go to the basement level for lab work before leaving today. Press "B" on the elevator. The lab is located at the first door on the left as you exit the elevator.  We have scheduled you for a follow up appointment with Dr Rush Landmark on 04/02/18 at 845am  You have been given a testing kit to check for small intestine bacterial overgrowth (SIBO) which is completed by a company named [facility]. Make sure to return your test in the mail using the return mailing label given you along with the kit. Your demographic and insurance information have already been sent to the company and they should be in contact with you over the next week regarding this test. Please keep in mind that you will be getting a call from phone number [phone] or a similar number. If you do not hear from them within this time frame, please call our office at [phone].

## 2018-03-10 ENCOUNTER — Encounter: Payer: Self-pay | Admitting: Gastroenterology

## 2018-03-10 DIAGNOSIS — K8020 Calculus of gallbladder without cholecystitis without obstruction: Secondary | ICD-10-CM | POA: Insufficient documentation

## 2018-03-10 DIAGNOSIS — R140 Abdominal distension (gaseous): Secondary | ICD-10-CM | POA: Insufficient documentation

## 2018-03-10 DIAGNOSIS — R932 Abnormal findings on diagnostic imaging of liver and biliary tract: Secondary | ICD-10-CM | POA: Insufficient documentation

## 2018-03-10 DIAGNOSIS — R12 Heartburn: Secondary | ICD-10-CM | POA: Insufficient documentation

## 2018-03-10 DIAGNOSIS — R198 Other specified symptoms and signs involving the digestive system and abdomen: Secondary | ICD-10-CM | POA: Insufficient documentation

## 2018-03-10 LAB — TISSUE TRANSGLUTAMINASE, IGA: (tTG) Ab, IgA: 1 U/mL

## 2018-03-10 NOTE — Progress Notes (Signed)
[place] VISIT   Primary Care Provider Mellody Dance, [place] [address] [phone]  Referring Provider Mellody Dance, [place],  [id number] [phone]  Patient Profile: Lindsey Mason is a 47 y.o. female with a pmh significant for MDD/HLD, DM, s/p Ovarian Cyst resection, GERD.  The patient presents to the [facility] for an evaluation and management of problem(s) noted below:  Problem List 1. Bloating   2. Alternating constipation and diarrhea   3. Pyrosis   4. Abnormal liver ultrasound   5. Calculus of gallbladder without cholecystitis without obstruction     History of Present Illness: This is the patient's first visit to the [facility] clinic.  She presents for evaluation of multiple GI problems including abdominal pain, acid reflux, belching/bloating, early satiety/loss of appetite, alternating constipation and diarrhea.  The patient describes having received antibiotics for sinusitis earlier in the year around May and after her antibiotics she began to have multiple issues occur.  She was developed issues of abdominal discomfort as well as bloating and diarrhea/loose stools.  Interestingly she has constipation at times very aiding.  She will occasionally have cramping as well.  She underwent liver biochemical testing as well as ultrasound imaging that did show cholelithiasis but no active cholecystitis.  Liver tests were normal.  She describes after a bowel movement having improvement in her symptoms more than 50% of the time.  Over the same course in time since the spring into this fall she has noted a decreased appetite.  She has had an associated weight loss of approximately 5 to 8 pounds which she is fine with.  However previously she did not have issues in regards to having early satiety.  She is now eating only one meal per day when 6 months ago she was eating 3 normal  meals per day.  In regards to her nausea this occurs randomly over the course of the day and there is no preference for postprandial or preprandial status.  She has not been under any increased stress.  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  She does describe occasional use of dietary drinks that have sugar substitutes.  The patient does not take significant nonsteroidals or BC/Goody powders.  In regards to her pyrosis she describes this occurring mostly after meals and she describes no dysphasia or odynophagia.  Her medications including metformin have been stable for long periods and time.  Now that she has been on Zantac many of her symptoms have improved slightly but are still present.  GI Review of Systems Positive as above  Negative for globus, vomiting, hematochezia, melena  Review of Systems General: Denies fevers/chills HEENT: Positive for hoarse this in voice (somewhat); denies oral lesions Cardiovascular: Denies chest pain Pulmonary: Denies shortness of breath Gastroenterological: See HPI Genitourinary: Denies darkened urine Hematological: Denies easy bruising/bleeding Endocrine: Denies temperature intolerance Dermatological: Denies jaundice Psychological: Mood has been slightly down/depressed Musculoskeletal: Denies new arthralgias   Medications Current Outpatient Medications  Medication Sig Dispense Refill  . buPROPion (WELLBUTRIN XL) 300 MG 24 hr tablet Take 1 tablet (300 mg total) by mouth daily. 90 tablet 1  . fluticasone (FLONASE) 50 MCG/ACT nasal spray 1 spray each nostril after sinus rinse twice daily 16 g 2  . ibuprofen (ADVIL,MOTRIN) 800 MG tablet Take 1 tablet (800 mg total) by mouth every 8 (eight) hours as needed. 30 tablet 0  . metFORMIN (GLUCOPHAGE-XR) 500 MG  24 hr tablet Take 4 tablets (2,000 mg total) by mouth daily with breakfast. (Patient taking differently: Take 2,000 mg by mouth every evening. ) 360 tablet 1  . ranitidine (ZANTAC) 300 MG  tablet 1/2 tab p.o. twice daily 90 tablet 3  . Semaglutide (OZEMPIC) 1 MG/DOSE SOPN Inject 1 mg into the skin once a week. 12 pen 3  . Vitamin D, Ergocalciferol, (DRISDOL) 50000 units CAPS capsule Please take 1 tablet Wednesday and 1 tablet Sunday every week 24 capsule 3   No current facility-administered medications for this visit.     Allergies Allergies  Allergen Reactions  . Lantus [Insulin Glargine] Other (See Comments)    headaches    Histories Past Medical History:  Diagnosis Date  . Clomid pregnancy 1997, 2005  . Depression   . Diabetes mellitus without mention of complication   . Esophageal reflux   . Headache(784.0)   . Infertility, female    PCOS - Clomid pregnancies   . Ovarian cyst 2019  . PCOS (polycystic ovarian syndrome) 05/27/2012  . Pure hypercholesterolemia   . Rosacea    Past Surgical History:  Procedure Laterality Date  . APPENDECTOMY    . CESAREAN SECTION     x 3, last one with triplets  . LAPAROSCOPIC BILATERAL SALPINGECTOMY Right 02/16/2018   Procedure: LAPAROSCOPIC RIGHT SALPINGECTOMY;  Surgeon: Megan Salon, MD;  Location: [facility];  Service: Gynecology;  Laterality: Right;  . TUBAL LIGATION     Social History   Socioeconomic History  . Marital status: Married    Spouse name: Not on file  . Number of children: 5  . Years of education: Not on file  . Highest education level: Not on file  Occupational History  . Occupation: Risk manager: [employer]  . Financial resource strain: Not on file  . Food insecurity:    Worry: Not on file    Inability: Not on file  . Transportation needs:    Medical: Not on file    Non-medical: Not on file  Tobacco Use  . Smoking status: Former Smoker    Packs/day: 1.00    Years: 15.00    Pack years: 15.00    Types: Cigarettes    Last attempt to quit: 03/04/2004    Years since quitting: 14.0  . Smokeless tobacco: Never Used  Substance and Sexual Activity   . Alcohol use: Yes    Comment: occ  . Drug use: No  . Sexual activity: Yes    Birth control/protection: Surgical  Lifestyle  . Physical activity:    Days per week: Not on file    Minutes per session: Not on file  . Stress: Not on file  Relationships  . Social connections:    Talks on phone: Not on file    Gets together: Not on file    Attends religious service: Not on file    Active member of club or organization: Not on file    Attends meetings of clubs or organizations: Not on file    Relationship status: Not on file  . Intimate partner violence:    Fear of current or ex partner: Not on file    Emotionally abused: Not on file    Physically abused: Not on file    Forced sexual activity: Not on file  Other Topics Concern  . Not on file  Social History Narrative  . Not on file   Family History  Problem  Relation Age of Onset  . Cancer Mother        breast  . Heart disease Mother   . Stroke Mother   . Diabetes Mother   . Diabetes Father   . Breast cancer Maternal Aunt   . Colon cancer Neg Hx   . Inflammatory bowel disease Neg Hx   . Liver disease Neg Hx   . Pancreatic cancer Neg Hx   . Stomach cancer Neg Hx   . Esophageal cancer Neg Hx    I have reviewed her medical, social, and family history in detail and updated the electronic medical record as necessary.    PHYSICAL EXAMINATION  BP 110/72   Pulse 80   Ht 5\' 6"  (1.676 m)   Wt 167 lb (75.8 kg)   BMI 26.95 kg/m  Wt Readings from Last 3 Encounters:  03/09/18 167 lb (75.8 kg)  02/16/18 164 lb 3.2 oz (74.5 kg)  02/03/18 167 lb (75.8 kg)  GEN: NAD, appears stated age, doesn't appear chronically ill PSYCH: Cooperative, without pressured speech EYE: Conjunctivae pink, sclerae anicteric ENT: MMM, without oral ulcers, no erythema or exudates noted NECK: Supple, enlarged neck girth CV: RR without R/Gs  RESP: CTAB posteriorly, without wheezing GI: NABS, soft, minimal tenderness to palpation in the  midepigastrium, ND, without rebound or guarding, no HSM appreciated MSK/EXT: No LE edema SKIN: No jaundice NEURO:  Alert & Oriented x 3, no focal deficits   REVIEW OF DATA  I reviewed the following data at the time of this encounter:  GI Procedures and Studies  No relevant studies to review  Laboratory Studies  Reviewed in [place]  Imaging Studies  5/19 Liver U/S IMPRESSION: Cholelithiasis without inflammation. Probable fatty infiltration of the liver. No other abnormality seen in the abdomen.   ASSESSMENT  Ms. Odle is a 47 y.o. female with a pmh significant for The patient is seen today for evaluation and management of:  1. Bloating   2. Alternating constipation and diarrhea   3. Pyrosis   4. Abnormal liver ultrasound   5. Calculus of gallbladder without cholecystitis without obstruction    This is a clinically and hemodynamically stable patient who has been referred for evaluation of multiple GI issues.  The underlying etiology to all of her symptoms is not completely delineated based on her history and further diagnostic evaluation will be required.  Based on many of her symptoms having occurred after recent antibiotics that were given I do think it is reasonable to consider the role of possible SIBO breath testing.  One thing that we will need to monitor closely is her overall appetite as well as possible early satiety which is occurred over the course the last 4 months.  Previously she had a normal appetite and would eat 3 meals a day and now is only eating 1 meal per day the weight loss has not been so significant.  It is possible that the patient has a functional bowel syndrome however the time course that this is occurred for is too short based on current [place] criteria.  We may need to consider an upper and lower endoscopy for her evaluation of her discomforts if things not improve and she is okay with that.  All patient questions were answered, to the best of my  ability, and the patient agrees to the aforementioned plan of action with follow-up as indicated.   PLAN  1. Bloating - TSH; Future - High sensitivity CRP; Future - Tissue transglutaminase,  IgA; Future - IgA; Future - Pancreatic elastase, fecal; Future - Helicobacter pylori special antigen; Future  2. Alternating constipation and diarrhea - TSH; Future - High sensitivity CRP; Future - Tissue transglutaminase, IgA; Future - IgA; Future - Pancreatic elastase, fecal; Future - Consideration of SIBO Breath testing in the future if workup above is negative  3. Pyrosis - Continue H2RA PRN (but hold until HP stool study has been obtained)  4. Abnormal liver ultrasound - Hepatic function panel; Future  5. Calculus of gallbladder without cholecystitis without obstruction   Orders Placed This Encounter  Procedures  . Helicobacter pylori special antigen  . Hepatic function panel  . TSH  . High sensitivity CRP  . Tissue transglutaminase, IgA  . IgA  . Pancreatic elastase, fecal    New Prescriptions   No medications on file   Modified Medications   No medications on file    Planned Follow Up: No follow-ups on file.   Justice Britain, MD [facility] Office # [phone]

## 2018-03-12 NOTE — Telephone Encounter (Signed)
I left another message for patient to call and reschedule.

## 2018-03-23 ENCOUNTER — Encounter: Payer: Self-pay | Admitting: Family Medicine

## 2018-03-23 ENCOUNTER — Telehealth: Payer: Self-pay | Admitting: Family Medicine

## 2018-03-23 NOTE — Telephone Encounter (Signed)
Patient was advised to call back with maiden name to look for missing vaccine information for a form our office is filling out. Her maiden name is Lindsey Mason.

## 2018-03-23 NOTE — Telephone Encounter (Signed)
Noted MPulliam, CMA/RT(R)  

## 2018-03-26 ENCOUNTER — Other Ambulatory Visit: Payer: BLUE CROSS/BLUE SHIELD

## 2018-03-26 DIAGNOSIS — R12 Heartburn: Secondary | ICD-10-CM

## 2018-03-26 DIAGNOSIS — R140 Abdominal distension (gaseous): Secondary | ICD-10-CM

## 2018-03-26 DIAGNOSIS — R198 Other specified symptoms and signs involving the digestive system and abdomen: Secondary | ICD-10-CM

## 2018-03-28 ENCOUNTER — Other Ambulatory Visit: Payer: Self-pay | Admitting: Family Medicine

## 2018-04-01 NOTE — Telephone Encounter (Signed)
Okay to close encounter?  °

## 2018-04-02 ENCOUNTER — Ambulatory Visit: Payer: Self-pay | Admitting: Gastroenterology

## 2018-04-02 LAB — HELICOBACTER PYLORI  SPECIAL ANTIGEN
MICRO NUMBER:: 91158345
SPECIMEN QUALITY: ADEQUATE

## 2018-04-02 LAB — PANCREATIC ELASTASE, FECAL: Pancreatic Elastase-1, Stool: >500 mcg/g

## 2018-04-03 NOTE — Telephone Encounter (Signed)
Ok to close encounter.  Thanks for update.

## 2018-05-05 ENCOUNTER — Ambulatory Visit (INDEPENDENT_AMBULATORY_CARE_PROVIDER_SITE_OTHER): Payer: BC Managed Care – PPO | Admitting: Family Medicine

## 2018-05-05 ENCOUNTER — Encounter: Payer: Self-pay | Admitting: Family Medicine

## 2018-05-05 VITALS — BP 118/81 | HR 94 | Temp 98.7°F | Ht 66.0 in | Wt 168.0 lb

## 2018-05-05 DIAGNOSIS — E1169 Type 2 diabetes mellitus with other specified complication: Secondary | ICD-10-CM

## 2018-05-05 DIAGNOSIS — Z5320 Procedure and treatment not carried out because of patient's decision for unspecified reasons: Secondary | ICD-10-CM | POA: Diagnosis not present

## 2018-05-05 DIAGNOSIS — E118 Type 2 diabetes mellitus with unspecified complications: Secondary | ICD-10-CM

## 2018-05-05 DIAGNOSIS — F331 Major depressive disorder, recurrent, moderate: Secondary | ICD-10-CM

## 2018-05-05 DIAGNOSIS — E782 Mixed hyperlipidemia: Secondary | ICD-10-CM

## 2018-05-05 DIAGNOSIS — E559 Vitamin D deficiency, unspecified: Secondary | ICD-10-CM

## 2018-05-05 MED ORDER — LOSARTAN POTASSIUM 50 MG PO TABS: 25.0000 mg | ORAL_TABLET | Freq: Every day | ORAL | 3 refills | Status: DC

## 2018-05-05 MED ORDER — METFORMIN HCL ER 500 MG PO TB24: ORAL_TABLET | ORAL | 1 refills | Status: DC

## 2018-05-05 MED ORDER — FLUOXETINE HCL 10 MG PO TABS: ORAL_TABLET | ORAL | 0 refills | Status: DC

## 2018-05-05 MED ORDER — VITAMIN D (ERGOCALCIFEROL) 1.25 MG (50000 UNIT) PO CAPS: ORAL_CAPSULE | ORAL | 3 refills | Status: DC

## 2018-05-05 NOTE — Progress Notes (Signed)
Impression and Recommendations:    1. Moderate episode of recurrent major depressive disorder ([place])   2. Controlled type 2 diabetes mellitus with complication, without long-term current use of insulin ([place])   3. Mixed diabetic hyperlipidemia associated with type 2 diabetes mellitus (HCC)   4. Statin declined   5. Vitamin D deficiency     Moderate episode of recurrent major depressive disorder (HCC)  - Mood is deteriorated from prior -Continue Wellbutrin, start fluoxetine - Start plan: FLUoxetine (PROZAC) 10 MG tablet - Start half of 10 mg tablet of Prozac for 4 days, go to full tablet for 4 days, then go to 2 tablets. -Patient understands Prozac is in addition to her Wellbutrin.  If needed in the future we will adjust dosage of that as needed. - Recommended marriage counseling   Controlled type 2 diabetes mellitus with complication, without long-term current use of insulin (HCC)  - A1c stable; tolerating meds well.  Continue current regimen.   - Last checked 3 months ago and was at 5.9 patient prefers to check at 6 months. - Plan: losartan (COZAAR) 50 MG tablet; started for renal protective benefits -Discussed recommended cholesterol management and kidney protection medications.  Mixed diabetic hyperlipidemia associated with type 2 diabetes mellitus (HCC) - LDL 81 last checked in February - Statin declined; educated patient about atherosclerotic cardiovascular risk and that due to her diabetes that is risk is significantly increased, statin is recommended in all diabetics. - Education about diet and exercise to aid in lowering cholesterol - While she declined meds she stated she would work on diet exercise and repeat chol in 6 months.  Vitamin D deficiency - Plan: Vitamin D, Ergocalciferol, (DRISDOL) 50000 units CAPS capsule   Education and routine counseling performed. Handouts provided.   Medications Discontinued During This Encounter  Medication Reason  . Vitamin D,  Ergocalciferol, (DRISDOL) 50000 units CAPS capsule Reorder  . metFORMIN (GLUCOPHAGE-XR) 500 MG 24 hr tablet Reorder      Meds ordered this encounter  Medications  . Vitamin D, Ergocalciferol, (DRISDOL) 50000 units CAPS capsule    Sig: Please take 1 tablet Wednesday and 1 tablet Sunday every week    Dispense:  24 capsule    Refill:  3  . metFORMIN (GLUCOPHAGE-XR) 500 MG 24 hr tablet    Sig: TAKE 4 TABLETS BY MOUTH ONCE DAILY WITH BREAKFAST    Dispense:  360 tablet    Refill:  1  . losartan (COZAAR) 50 MG tablet    Sig: Take 0.5 tablets (25 mg total) by mouth daily.    Dispense:  45 tablet    Refill:  3  . FLUoxetine (PROZAC) 10 MG tablet    Sig: Take 0.5 tablets (5 mg total) by mouth daily for 4 days, THEN 1 tablet (10 mg total) daily for 4 days, THEN 2 tablets (20 mg total) daily.    Dispense:  170 tablet    Refill:  0    The patient was counseled, risk factors were discussed, anticipatory guidance given.  Gross side effects, risk and benefits, and alternatives of medications discussed with patient.  Patient is aware that all medications have potential side effects and we are unable to predict every side effect or drug-drug interaction that may occur.  Expresses verbal understanding and consents to current therapy plan and treatment regimen.   Return for 6 weeks - mood/ GAD/Depress- added Fluox.  - Obtain A1C at that time-last checked 01/27/2018.   Please see AVS handed  out to patient at the end of our visit for further patient instructions/ counseling done pertaining to today's office visit.    Note:  This document was prepared using Dragon voice recognition software and may include unintentional dictation errors.   This document serves as a record of services personally performed by Mellody Dance, DO. It was created on her behalf by Wilburn Mylar, a trained medical scribe. The creation of this record is based on the scribe's personal observations and the provider's  statements to them.   I have reviewed the above medical documentation for accuracy and completeness and I concur.  Mellody Dance 05/05/18 1:21 PM      Subjective:    Chief Complaint  Patient presents with  . Follow-up     Lindsey Mason is a 47 y.o. female who presents to [place] [facility] today for Diabetes Management.     DM HPI: -  She has not been working on diet and exercise for diabetes - Pt is currently maintained on the following medications for diabetes:   see med list today - Medication compliance - well controlled - Home glucose readings range always below 140 2-hour postprandial, fasting always below 120. - Denies polyuria/polydipsia. - Denies hypo/ hyperglycemia symptoms - She denies new onset of: chest pain, exercise intolerance, shortness of breath, dizziness, visual changes, headache, lower extremity swelling or claudication.   Last diabetic eye exam was  Lab Results  Component Value Date   HMDIABEYEEXA No Retinopathy 12/01/2017    Last A1C in the office was:  Lab Results  Component Value Date   HGBA1C 5.9 01/27/2018   HGBA1C 9.6 11/04/2017   HGBA1C >14 08/04/2017    Lab Results  Component Value Date   MICROALBUR 10 08/04/2017   [place] 81 08/14/2017   CREATININE 0.40 (L) 02/16/2018    Worsening mood: - Feels Wellbutrin is not helping as much as it did in the past, increasing stress lately.  - Experiencing paranoia, continued anxiety due to her husband having an affair on her several months ago.  She has continued trust issues.  - She is not going to counseling. -She has not been exercising regularly, new job going well but she is working 12 to 14-hour days.  Is feeling overwhelmed and stressed - Husband is having relapse issues with opioid - She had little success with lexipro and zoloft.    CHOL HPI:  -  She  is currently managed with: Nothing she declines statin medications. The 10-year ASCVD risk score Mikey Bussing  DC Brooke Bonito., et al., 2013) is: 1.7%   Values used to calculate the score:     Age: 16 years     Sex: Female     Is Non-Hispanic African American: No     Diabetic: Yes     Tobacco smoker: No     Systolic Blood Pressure: 458 mmHg     Is BP treated: No     HDL Cholesterol: 43 mg/dL     Total Cholesterol: 156 mg/dL  Last lipid panel as follows:  Lab Results  Component Value Date   CHOL 156 08/14/2017   HDL 43 08/14/2017   LDLCALC 81 08/14/2017   LDLDIRECT 104.2 03/09/2014   TRIG 158 (H) 08/14/2017   CHOLHDL 3.6 08/14/2017    Hepatic Function Latest Ref Rng & Units 03/09/2018 11/19/2017 11/19/2017  Total Protein 6.0 - 8.3 g/dL 6.9 - 7.1  Albumin 3.5 - 5.2 g/dL 4.3 - 4.6  AST 0 -  37 U/L 8 - 8  ALT 0 - 35 U/L 12 - 12  Alk Phosphatase 39 - 117 U/L 59 - 72  Total Bilirubin 0.2 - 1.2 mg/dL 0.5 0.5 0.5  Bilirubin, Direct 0.0 - 0.3 mg/dL 0.1 0.10 -       Last 3 blood pressure readings in our office are as follows: BP Readings from Last 3 Encounters:  05/05/18 118/81  03/09/18 110/72  02/16/18 128/78    BMI Readings from Last 3 Encounters:  05/05/18 27.12 kg/m  03/09/18 26.95 kg/m  02/16/18 26.50 kg/m     Problem  Type 2 Diabetes Mellitus (Hcc)  DM II (Diabetes Mellitus, Type Ii), Controlled (Hcc)   46 yo triplets- Gest DM with that preg  - 1999- DM and started with metformin at around age 89.  2 yrs ago- was on 4 shots insulin daily but weighed 216 lbs at least.   Has steadly lost wt since  Has not taken metformin glipizide as prescribed since at least August\September 2018.   Statin Declined      Patient Care Team    Relationship Specialty Notifications Start End  Mellody Dance, DO PCP - General Family Medicine  08/14/17   Marin Comment, My [place], [place] Referring Physician Optometry  08/04/17      Patient Active Problem List   Diagnosis Date Noted  . Type 2 diabetes mellitus ([place]) 02/06/2018    Priority: High  . Mixed diabetic hyperlipidemia associated with type 2  diabetes mellitus ([place]) 08/26/2017    Priority: High  . Major depressive disorder, recurrent episode ([place]) 08/20/2010    Priority: High  . Hyperlipidemia associated with type 2 diabetes mellitus ([place]) 04/11/2009    Priority: High  . DM II (diabetes mellitus, type II), controlled ([place]) 03/08/2009    Priority: High  . Statin declined 05/05/2018  . Bloating 03/10/2018  . Alternating constipation and diarrhea 03/10/2018  . Pyrosis 03/10/2018  . Abnormal liver ultrasound 03/10/2018  . Calculus of gallbladder without cholecystitis without obstruction 03/10/2018  . h/o Enlarged ovaries 11/20/2017  . Status post appendectomy 11/19/2017  . Environmental and seasonal allergies 11/04/2017  . Psychophysiological insomnia 11/04/2017  . Vitamin D deficiency 08/26/2017  . Depression 09/03/2016  . Overweight (BMI 25.0-29.9) 09/03/2016  . Witnessed episode of apnea 09/03/2016  . Urinary frequency 04/03/2016  . 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 PCOS (polycystic ovarian syndrome) 05/27/2012  . 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 GERD 03/08/2009     Past Medical History:  Diagnosis Date  . Clomid pregnancy 1997, 2005  . Depression   . Diabetes mellitus without mention of complication   . Esophageal reflux   . Headache(784.0)   . Infertility, female    PCOS - Clomid pregnancies   . Ovarian cyst 2019  . PCOS (polycystic ovarian syndrome) 05/27/2012  . Pure hypercholesterolemia   . Rosacea      Past Surgical History:  Procedure Laterality Date  . APPENDECTOMY    . CESAREAN SECTION     x 3, last one with triplets  . LAPAROSCOPIC BILATERAL SALPINGECTOMY Right 02/16/2018   Procedure: LAPAROSCOPIC RIGHT SALPINGECTOMY;  Surgeon: Megan Salon, MD;  Location: [facility];  Service: Gynecology;  Laterality: Right;  . TUBAL LIGATION       Family History  Problem Relation Age of Onset  . Cancer Mother        breast  . Heart  disease Mother   . Stroke Mother   .  Diabetes Mother   . Diabetes Father   . Breast cancer Maternal Aunt   . Colon cancer Neg Hx   . Inflammatory bowel disease Neg Hx   . Liver disease Neg Hx   . Pancreatic cancer Neg Hx   . Stomach cancer Neg Hx   . Esophageal cancer Neg Hx      Social History   Substance and Sexual Activity  Drug Use No  ,  Social History   Substance and Sexual Activity  Alcohol Use Yes   Comment: occ  ,  Social History   Tobacco Use  Smoking Status Former Smoker  . Packs/day: 1.00  . Years: 15.00  . Pack years: 15.00  . Types: Cigarettes  . Last attempt to quit: 03/04/2004  . Years since quitting: 14.1  Smokeless Tobacco Never Used  ,    Current Outpatient Medications on File Prior to Visit  Medication Sig Dispense Refill  . buPROPion (WELLBUTRIN XL) 300 MG 24 hr tablet Take 1 tablet (300 mg total) by mouth daily. 90 tablet 1  . fluticasone (FLONASE) 50 MCG/ACT nasal spray 1 spray each nostril after sinus rinse twice daily 16 g 2  . ibuprofen (ADVIL,MOTRIN) 800 MG tablet Take 1 tablet (800 mg total) by mouth every 8 (eight) hours as needed. 30 tablet 0  . ranitidine (ZANTAC) 300 MG tablet 1/2 tab p.o. twice daily 90 tablet 3  . Semaglutide (OZEMPIC) 1 MG/DOSE SOPN Inject 1 mg into the skin once a week. 12 pen 3   No current facility-administered medications on file prior to visit.      Allergies  Allergen Reactions  . Lantus [Insulin Glargine] Other (See Comments)    headaches     Review of Systems:   General:  Denies fever, chills Optho/Auditory:   Denies visual changes, blurred vision Respiratory:   Denies SOB, cough, wheeze, DIB  Cardiovascular:   Denies chest pain, palpitations, painful respirations Gastrointestinal:   Denies nausea, vomiting, diarrhea.  Endocrine:     Denies new hot or cold intolerance Musculoskeletal:  Denies joint swelling, gait issues, or new unexplained myalgias/ arthralgias Skin:  Denies rash, suspicious  lesions  Neurological:    Denies dizziness, unexplained weakness, numbness  Psychiatric/Behavioral:   Positive for anxiety    Objective:     Blood pressure 118/81, pulse 94, temperature 98.7 F (37.1 C), height _0  (1.676 m), weight 168 lb (76.2 kg), SpO2 99 %.  Body mass index is 27.12 kg/m.    General: Well Developed, well nourished, and in no acute distress.  HEENT: Normocephalic, atraumatic Skin: Warm and dry, cap RF less 2 sec, good turgor Chest:  Normal excursion, shape, no gross abn Respiratory: speaking in full sentences, no conversational dyspnea NeuroM-Sk: Ambulates w/o assistance, moves * 4 Psych: A and O *3, insight good, mood-full

## 2018-05-05 NOTE — Patient Instructions (Addendum)
Please taper the dose of Prozac as we discussed and as written on your prescription.  Follow-up in 6 weeks or get in touch with [place] sooner if you have any questions or concerns.    Behavioral Health/ Counseling Referrals    Anderson Malta, personal counselor in [place], specializing in marriage counseling    Dr. Tomi Bamberger, PHD Dr. Tomi Bamberger, PHD is a counselor in [place], [place].  [address] [place], [place] [address] [place] [phone]   Kristie Cowman, [place]  [phone] [email or web address] ( she does [place] and faith-based coaching and counseling )    [employer] ( faith-based counseling ) Address: [place]. [place], [place] [address] [phone] Office [address] for appointments [phone] Fax Hours: Monday - Thursday 8:00am-6:00pm Closed for lunch 12-1Thursday only Friday: Closed all day   Steffanie Rainwater: [phone] or Meg [phone] -counselors in [place] who are faith based   -Also Ms. Marya Fossa - [place] [facility]. Darrick Meigs based counseling.    [facility] Nunzio Cobbs, LCSW, ACSW, M.ED.  -Nunzio Cobbs is a licensed clinical social worker in practice over 35 years and with Dr. Chucky May for the last 10 years.  -She sees adults, adolescents, children & families and couples. -Services are provided for mood and anxiety disorders, marital issues, family or parent/child problems, parenting, co-dependency, gender issues, trauma, grief, and stages of life issues. She also provides critical incident stress debriefing.  -Pamala Hurry accepts many employee assistance programs (EAP), [employer], Pharmacist, hospital.  PHONE  [phone]                FAX [phone]   Rodena [email or web address] [employer]- gen counseling;  PHD   Wilber Oliphant, MSW [address] [place]   [facility] Apolonio Schneiders, PhD [address] [place] [phone]   [place] [facility]- children [address], [place], [place] [phone]   Lanelle Bal Professional Counselor Counseling and [employer] [place] abuse [facility] Manager [address], [place] [phone] [place] [address], [place] Delmer Islam, PhD **   -adult, adolescent, and child Oneida Arenas, PhD Leitha Bleak, LCSW Jillene Bucks, PhD-child, adolescent and adults   [facility] [address], [place] [phone] Merilyn Baba, MS-child, adolescent and adults Lennart Pall, PhD-adolescent and adults   [facility]-grief, terminal illness [place], [place] [address], [place] [facility] [address], [place], [place]   [facility] [address], [place], [place] [phone]   [facility] [address], [place] [phone]   [facility] [address], [place]   [facility] [address], [place] [address], [place]

## 2018-05-07 ENCOUNTER — Encounter: Payer: Self-pay | Admitting: Gastroenterology

## 2018-05-07 ENCOUNTER — Ambulatory Visit: Payer: Self-pay | Admitting: Gastroenterology

## 2018-06-29 ENCOUNTER — Telehealth: Payer: Self-pay | Admitting: Obstetrics & Gynecology

## 2018-06-29 NOTE — Telephone Encounter (Signed)
Spoke with patient. Patient states that she has been having an ongoing white vaginal discharge. Denies any vaginal itching or irritation. Denies any pain or other symptoms. Patient is concerned as she was unable to keep her post op appointment in September due to starting a new job. Would like to be seen for evaluation. Appointment scheduled for 06/30/2018 at 3:45 pm with Dr.Miller. Patient is agreeable to date and time.  Routing to provider and will close encounter.

## 2018-06-29 NOTE — Telephone Encounter (Signed)
Patient called and stated that she missed her Post-op appointment back in September. Patient stated that she is now experiencing discharge that she believes is "not normal." Patient would like to reschedule follow up appointment.

## 2018-06-30 ENCOUNTER — Encounter: Payer: Self-pay | Admitting: Certified Nurse Midwife

## 2018-06-30 ENCOUNTER — Ambulatory Visit: Payer: BLUE CROSS/BLUE SHIELD | Admitting: Obstetrics & Gynecology

## 2018-06-30 ENCOUNTER — Other Ambulatory Visit: Payer: Self-pay

## 2018-06-30 ENCOUNTER — Ambulatory Visit: Payer: BLUE CROSS/BLUE SHIELD | Admitting: Certified Nurse Midwife

## 2018-06-30 VITALS — BP 108/64 | HR 70 | Resp 16 | Wt 168.0 lb

## 2018-06-30 DIAGNOSIS — Z01419 Encounter for gynecological examination (general) (routine) without abnormal findings: Secondary | ICD-10-CM

## 2018-06-30 DIAGNOSIS — N898 Other specified noninflammatory disorders of vagina: Secondary | ICD-10-CM | POA: Diagnosis not present

## 2018-06-30 DIAGNOSIS — Z113 Encounter for screening for infections with a predominantly sexual mode of transmission: Secondary | ICD-10-CM | POA: Diagnosis not present

## 2018-06-30 NOTE — Progress Notes (Addendum)
47 y.o. Married Caucasian female [phone] here with complaint of vaginal symptoms of  slight odor,no itching, occasional burning, and slight increase discharge since 9/19 after her surgery for ovarian cysts. She has concerns about spouse and would like to have all STD screening. Denies new personal products or vaginal dryness. Urinary symptoms none . Contraception is tubal ligation. History of HSV 1 oral only. Has noted no blisters or skin lesions. Just would like to know she is fine. Did not have follow up after her surgery, but will come in if other issues.  Review of Systems  Constitutional: Negative.   HENT: Negative.   Eyes: Negative.   Respiratory: Negative.   Cardiovascular: Negative.   Gastrointestinal: Negative.   Genitourinary:       Vaginal discharge with slight odor  Musculoskeletal: Negative.   Skin: Negative.   Neurological: Negative.   Endo/Heme/Allergies: Negative.   Psychiatric/Behavioral: Negative.     O:Healthy female WDWN Affect: normal, orientation x 3  Exam: Skin warm and dry.  Abdomen: soft, non tender, no masses Inguinal Lymph node : no enlargement or tenderness Pelvic exam: External genital: normal female,  No lesions  noted BUS: negative Vagina: white watery discharge very slight odor noted.   ,Wet  Affirm taken, GC/Chlamydia screen obtained Cervix: normal, non tender, no CMT Uterus: normal, non tender Adnexa:normal, non tender, no masses or fullness noted:   A:Normal pelvic exam Contraception BTL Chronic normal appearing vaginal discharge R/O vaginal infection STD screening   P:Discussed findings of normal pelvic exam.  Discussed Aveeno or baking soda sitz bath for comfort and odor control if occurs. Labs: Affirm, Gc/Chlamydia, STD panel, Hep C Declined HSV 1,2 screening. Discussed condom use to prevent STD transmission. If all negative recommended repeat at next aex. Questions addressed.  Rv prn

## 2018-07-01 LAB — GC/CHLAMYDIA PROBE AMP
CHLAMYDIA, DNA PROBE: NEGATIVE
Neisseria gonorrhoeae by PCR: NEGATIVE

## 2018-07-01 LAB — HEPATITIS C ANTIBODY

## 2018-07-01 LAB — HEP, RPR, HIV PANEL
HIV SCREEN 4TH GENERATION: NONREACTIVE
Hepatitis B Surface Ag: NEGATIVE
RPR: NONREACTIVE

## 2018-07-01 LAB — VAGINITIS/VAGINOSIS, DNA PROBE
Candida Species: NEGATIVE
Gardnerella vaginalis: NEGATIVE
Trichomonas vaginosis: NEGATIVE

## 2018-07-20 NOTE — Progress Notes (Signed)
Subjective:    Patient ID: Lindsey Mason, female    DOB: 15-Aug-1970, 48 y.o.   MRN: [id number]  HPI:  Ms. Christine presents with   Patient Care Team    Relationship Specialty Notifications Start End  Mellody Dance, DO PCP - General Family Medicine  08/14/17   Marin Comment, My [place], [place] Referring Physician Optometry  08/04/17     Patient Active Problem List   Diagnosis Date Noted  . Statin declined 05/05/2018  . Bloating 03/10/2018  . Alternating constipation and diarrhea 03/10/2018  . Pyrosis 03/10/2018  . Abnormal liver ultrasound 03/10/2018  . Calculus of gallbladder without cholecystitis without obstruction 03/10/2018  . Type 2 diabetes mellitus ([place]) 02/06/2018  . h/o Enlarged ovaries 11/20/2017  . Status post appendectomy 11/19/2017  . Environmental and seasonal allergies 11/04/2017  . Psychophysiological insomnia 11/04/2017  . Vitamin D deficiency 08/26/2017  . Mixed diabetic hyperlipidemia associated with type 2 diabetes mellitus ([place]) 08/26/2017  . Depression 09/03/2016  . Overweight (BMI 25.0-29.9) 09/03/2016  . Witnessed episode of apnea 09/03/2016  . Urinary frequency 04/03/2016  . 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 PCOS (polycystic ovarian syndrome) 05/27/2012  . 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 Major depressive disorder, recurrent episode ([place]) 08/20/2010  . Hyperlipidemia associated with type 2 diabetes mellitus ([place]) 04/11/2009  . 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 DM II (diabetes mellitus, type II), controlled ([place]) 03/08/2009  . GERD 03/08/2009     Past Medical History:  Diagnosis Date  . Clomid pregnancy 1997, 2005  . Depression   . Diabetes mellitus without mention of complication   . Esophageal reflux   . Headache(784.0)   . Infertility, female    PCOS - Clomid pregnancies   . Ovarian cyst 2019  . PCOS (polycystic ovarian syndrome) 05/27/2012  . Pure hypercholesterolemia   . Rosacea      Past Surgical History:  Procedure Laterality Date   . APPENDECTOMY    . CESAREAN SECTION     x 3, last one with triplets  . LAPAROSCOPIC BILATERAL SALPINGECTOMY Right 02/16/2018   Procedure: LAPAROSCOPIC RIGHT SALPINGECTOMY;  Surgeon: Megan Salon, MD;  Location[facility];  Service: Gynecology;  Laterality: Right;  . TUBAL LIGATION       Family History  Problem Relation Age of Onset  . Cancer Mother        breast  . Heart disease Mother   . Stroke Mother   . Diabetes Mother   . Diabetes Father   . Breast cancer Maternal Aunt   . Colon cancer Neg Hx   . Inflammatory bowel disease Neg Hx   . Liver disease Neg Hx   . Pancreatic cancer Neg Hx   . Stomach cancer Neg Hx   . Esophageal cancer Neg Hx      Social History   Substance and Sexual Activity  Drug Use No     Social History   Substance and Sexual Activity  Alcohol Use Yes   Comment: occ     Social History   Tobacco Use  Smoking Status Former Smoker  . Packs/day: 1.00  . Years: 15.00  . Pack years: 15.00  . Types: Cigarettes  . Last attempt to quit: 03/04/2004  . Years since quitting: 14.3  Smokeless Tobacco Never Used     Outpatient Encounter Medications as of 07/21/2018  Medication Sig Note  . buPROPion (WELLBUTRIN XL) 300 MG 24 hr tablet Take 1 tablet (300 mg total) by mouth daily.   [name]  FLUoxetine (PROZAC) 10 MG tablet Take 0.5 tablets (5 mg total) by mouth daily for 4 days, THEN 1 tablet (10 mg total) daily for 4 days, THEN 2 tablets (20 mg total) daily.   . fluticasone (FLONASE) 50 MCG/ACT nasal spray 1 spray each nostril after sinus rinse twice daily   . ibuprofen (ADVIL,MOTRIN) 800 MG tablet Take 1 tablet (800 mg total) by mouth every 8 (eight) hours as needed.   [name] losartan (COZAAR) 50 MG tablet Take 0.5 tablets (25 mg total) by mouth daily. (Patient not taking: Reported on 06/30/2018)   . metFORMIN (GLUCOPHAGE-XR) 500 MG 24 hr tablet TAKE 4 TABLETS BY MOUTH ONCE DAILY WITH BREAKFAST   . ranitidine (ZANTAC) 300 MG tablet 1/2 tab  p.o. twice daily (Patient taking differently: 1/2 tab p.o. prn)   . Semaglutide (OZEMPIC) 1 MG/DOSE SOPN Inject 1 mg into the skin once a week. 12/09/2017: 0.5 mg  per patient    . Vitamin D, Ergocalciferol, (DRISDOL) 50000 units CAPS capsule Please take 1 tablet Wednesday and 1 tablet Sunday every week    No facility-administered encounter medications on file as of 07/21/2018.     Allergies: Lantus [insulin glargine]  There is no height or weight on file to calculate BMI.  There were no vitals taken for this visit.     Review of Systems     Objective:   Physical Exam        Assessment & Plan:  No diagnosis found.  No problem-specific Assessment & Plan notes found for this encounter.    FOLLOW-UP:  No follow-ups on file.

## 2018-07-21 ENCOUNTER — Encounter: Payer: Self-pay | Admitting: Family Medicine

## 2018-07-21 ENCOUNTER — Ambulatory Visit (INDEPENDENT_AMBULATORY_CARE_PROVIDER_SITE_OTHER): Payer: BC Managed Care – PPO | Admitting: Family Medicine

## 2018-07-21 VITALS — BP 122/80 | HR 69 | Temp 98.7°F | Ht 66.0 in | Wt 176.4 lb

## 2018-07-21 DIAGNOSIS — J069 Acute upper respiratory infection, unspecified: Secondary | ICD-10-CM

## 2018-07-21 DIAGNOSIS — J0100 Acute maxillary sinusitis, unspecified: Secondary | ICD-10-CM

## 2018-07-21 MED ORDER — HYDROCOD POLST-CPM POLST ER 10-8 MG/5ML PO SUER: 5.0000 mL | Freq: Two times a day (BID) | ORAL | 0 refills | Status: DC | PRN

## 2018-07-21 MED ORDER — AMOXICILLIN 500 MG PO CAPS
1000.0000 mg | ORAL_CAPSULE | Freq: Two times a day (BID) | ORAL | 0 refills | Status: DC
Start: 2018-07-21 — End: 2018-10-01

## 2018-07-21 NOTE — Patient Instructions (Signed)
Patient to be out of work today and tomorrow.  Can return to work the following day if fever free 24 hours and off all antipyretics  Feel better Lindsey Mason!!

## 2018-07-21 NOTE — Progress Notes (Signed)
Acute Care Office visit   Assessment and plan:  1. Acute maxillary sinusitis, recurrence not specified   2. URI with cough and congestion     - Viral vs Allergic vs Bacterial causes for pt's symptoms reveiwed.    - Supportive care and various OTC medications (dayquil and nyquil) discussed in addition to any prescribed. -Due to the patients symptoms being longer than 7 days and worsening, I will prescribe abx, amoxil today.  -Additionally, I will prescribe tussionex to aid with the patient's cough today.  -Discussed that the patient will not be able to return to work until she is fever free (fever less than 100 degrees orally) without any antipyretics.  -Will write the patient out of work for today and tomorrow.  - Call or RTC if new symptoms, or if no improvement or worse over next several days.     Meds ordered this encounter  Medications  . amoxicillin (AMOXIL) 500 MG capsule    Sig: Take 2 capsules (1,000 mg total) by mouth 2 (two) times daily.    Dispense:  40 capsule    Refill:  0  . chlorpheniramine-HYDROcodone (TUSSIONEX) 10-8 MG/5ML SUER    Sig: Take 5 mLs by mouth every 12 (twelve) hours as needed for cough (cough, will cause drowsiness.).    Dispense:  200 mL    Refill:  0    Gross side effects, risk and benefits, and alternatives of medications discussed with patient.  Patient is aware that all medications have potential side effects and we are unable to predict every sideeffect or drug-drug interaction that may occur.  Expresses verbal understanding and consents to current therapy plan and treatment regiment.   Education and routine counseling performed. Handouts provided.  Anticipatory guidance and routine counseling done re: condition, txmnt options and need for follow up. All questions of patient's were answered.  Return if symptoms worsen or fail to improve.  Please see AVS handed out to patient at the end of our visit for additional patient  instructions/counseling done pertaining to today's office visit.  Note:  This document was partially repared using Dragon voice recognition software and may include unintentional dictation errors.  This document serves as a record of services personally performed by Mellody Dance, DO. It was created on her behalf by Steva Colder, a trained medical scribe. The creation of this record is based on the scribe's personal observations and the provider's statements to them.   I have reviewed the above medical documentation for accuracy and completeness and I concur.  Mellody Dance, DO 07/26/2018 5:46 PM       Subjective:    Chief Complaint  Patient presents with  . Sinus Problem    HPI:  Pt presents with Sx for 7 days  C/o: Sinus issues, HA, teeth pain, maxillary swelling, fullness sensation to bilateral ears, myalgias, diarrhea, and cough (worse at night and noted as "thick" first thing in the morning).  Denies: Fever, nausea, and vomiting.     For symptoms patient has tried:  OTC Dayquil and Nyquil.   Overall getting:   She is getting overall worse at this time.    Patient Care Team    Relationship Specialty Notifications Start End  Mellody Dance, DO PCP - General Family Medicine  08/14/17   Marin Comment, My [place], [place] Referring Physician Optometry  08/04/17     Past medical history, Surgical history, Family history reviewed and noted below, Social history, Allergies, and Medications have been entered into the  medical record, reviewed and changed as needed.   Allergies  Allergen Reactions  . Lantus [Insulin Glargine] Other (See Comments)    headaches    Review of Systems: - see above HPI for pertinent positives General:   No F/C, wt loss Pulm:   No DIB, pleuritic chest pain Card:  No CP, palpitations Abd:  No n/v/d or pain Ext:  No inc edema from baseline   Objective:   Blood pressure 122/80, pulse 69, temperature 98.7 F (37.1 C), height 5\' 6"  (1.676 m), weight 176 lb  6.4 oz (80 kg), SpO2 100 %. Body mass index is 28.47 kg/m. General: Well Developed, well nourished, appropriate for stated age.  Neuro: Alert and oriented x3, extra-ocular muscles intact, sensation grossly intact.  HEENT: Normocephalic, atraumatic, pupils equal round reactive to light, neck supple, no masses, no painful lymphadenopathy, TM's slightly bulging bilaterally, no erythema, no air fluid levels, no acute findings. Nares- patent, clear d/c, OP- clear, mild erythema, No TTP sinuses Skin: Warm and dry, no gross rash. Cardiac: RRR, S1 S2,  no murmurs rubs or gallops.  Respiratory: ECTA B/L and A/P, Not using accessory muscles, speaking in full sentences- unlabored. Vascular:  No gross lower ext edema, cap RF less 2 sec. Psych: No HI/SI, judgement and insight good, Euthymic mood. Full Affect.

## 2018-09-16 ENCOUNTER — Other Ambulatory Visit: Payer: Self-pay

## 2018-09-16 ENCOUNTER — Encounter: Payer: Self-pay | Admitting: Adult Health

## 2018-09-16 ENCOUNTER — Ambulatory Visit (INDEPENDENT_AMBULATORY_CARE_PROVIDER_SITE_OTHER): Payer: BC Managed Care – PPO | Admitting: Adult Health

## 2018-09-16 VITALS — BP 130/81 | HR 79 | Temp 98.7°F | Ht 66.0 in | Wt 176.7 lb

## 2018-09-16 DIAGNOSIS — J0100 Acute maxillary sinusitis, unspecified: Secondary | ICD-10-CM | POA: Diagnosis not present

## 2018-09-16 MED ORDER — AMOXICILLIN-POT CLAVULANATE 875-125 MG PO TABS: 1.0000 | ORAL_TABLET | Freq: Two times a day (BID) | ORAL | 0 refills | Status: DC

## 2018-09-16 MED ORDER — FLUTICASONE PROPIONATE 50 MCG/ACT NA SUSP: 2.0000 | Freq: Every day | NASAL | 2 refills | Status: DC

## 2018-09-16 NOTE — Assessment & Plan Note (Signed)
Please take Augmentin as directed and Flonase as needed. Continue to push fluids and alternate OTC Acetaminophen and Ibuprofen as needed. If symptoms persist after Augmentin completed, please call clinic.

## 2018-09-16 NOTE — Progress Notes (Signed)
Subjective:    Patient ID: Lindsey Mason, female    DOB: 1970-08-23, 48 y.o.   MRN: [id number]  HPI:  Lindsey Mason presents with L sided facial pressure and pain (aching, 8/10), copious clear nasal drainage, post nasal gtt, and infrequent non-productive cough that has steadily been worsening for 2 weeks. She denies fever/night sweats/chills/N/V/D She denies CP/dyspnea/dizziness/palpitations She has been using OTC Acetaminophen, Ibuprofen, and nasal spray- all last used this morning, current temp- 98.69f oral She denies tobacco/vape  Patient Care Team    Relationship Specialty Notifications Start End  Mellody Dance, DO PCP - General Family Medicine  08/14/17   Marin Comment, My [place], [place] Referring Physician Optometry  08/04/17     Patient Active Problem List   Diagnosis Date Noted  . Statin declined 05/05/2018  . Bloating 03/10/2018  . Alternating constipation and diarrhea 03/10/2018  . Pyrosis 03/10/2018  . Abnormal liver ultrasound 03/10/2018  . Calculus of gallbladder without cholecystitis without obstruction 03/10/2018  . Type 2 diabetes mellitus ([place]) 02/06/2018  . h/o Enlarged ovaries 11/20/2017  . Status post appendectomy 11/19/2017  . Environmental and seasonal allergies 11/04/2017  . Psychophysiological insomnia 11/04/2017  . Vitamin D deficiency 08/26/2017  . Mixed diabetic hyperlipidemia associated with type 2 diabetes mellitus ([place]) 08/26/2017  . Depression 09/03/2016  . Overweight (BMI 25.0-29.9) 09/03/2016  . Witnessed episode of apnea 09/03/2016  . Urinary frequency 04/03/2016  . Acute maxillary sinusitis 11/04/2014  . 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 PCOS (polycystic ovarian syndrome) 05/27/2012  . 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 Major depressive disorder, recurrent episode ([place]) 08/20/2010  . Hyperlipidemia associated with type 2 diabetes mellitus ([place]) 04/11/2009  . 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 DM II (diabetes mellitus, type II), controlled ([place])  03/08/2009  . GERD 03/08/2009     Past Medical History:  Diagnosis Date  . Clomid pregnancy 1997, 2005  . Depression   . Diabetes mellitus without mention of complication   . Esophageal reflux   . Headache(784.0)   . Infertility, female    PCOS - Clomid pregnancies   . Ovarian cyst 2019  . PCOS (polycystic ovarian syndrome) 05/27/2012  . Pure hypercholesterolemia   . Rosacea      Past Surgical History:  Procedure Laterality Date  . APPENDECTOMY    . CESAREAN SECTION     x 3, last one with triplets  . LAPAROSCOPIC BILATERAL SALPINGECTOMY Right 02/16/2018   Procedure: LAPAROSCOPIC RIGHT SALPINGECTOMY;  Surgeon: Megan Salon, MD;  Location: [facility];  Service: Gynecology;  Laterality: Right;  . TUBAL LIGATION       Family History  Problem Relation Age of Onset  . Cancer Mother        breast  . Heart disease Mother   . Stroke Mother   . Diabetes Mother   . Diabetes Father   . Breast cancer Maternal Aunt   . Colon cancer Neg Hx   . Inflammatory bowel disease Neg Hx   . Liver disease Neg Hx   . Pancreatic cancer Neg Hx   . Stomach cancer Neg Hx   . Esophageal cancer Neg Hx      Social History   Substance and Sexual Activity  Drug Use No     Social History   Substance and Sexual Activity  Alcohol Use Yes   Comment: occ     Social History   Tobacco Use  Smoking Status Former Smoker  . Packs/day: 1.00  . Years:  15.00  . Pack years: 15.00  . Types: Cigarettes  . Last attempt to quit: 03/04/2004  . Years since quitting: 14.5  Smokeless Tobacco Never Used     Outpatient Encounter Medications as of 09/16/2018  Medication Sig Note  . amoxicillin (AMOXIL) 500 MG capsule Take 2 capsules (1,000 mg total) by mouth 2 (two) times daily.   [name] buPROPion (WELLBUTRIN XL) 300 MG 24 hr tablet Take 1 tablet (300 mg total) by mouth daily.   . chlorpheniramine-HYDROcodone (TUSSIONEX) 10-8 MG/5ML SUER Take 5 mLs by mouth every 12 (twelve) hours as  needed for cough (cough, will cause drowsiness.).   [name] FLUoxetine (PROZAC) 10 MG tablet Take 0.5 tablets (5 mg total) by mouth daily for 4 days, THEN 1 tablet (10 mg total) daily for 4 days, THEN 2 tablets (20 mg total) daily.   . fluticasone (FLONASE) 50 MCG/ACT nasal spray 1 spray each nostril after sinus rinse twice daily   . ibuprofen (ADVIL,MOTRIN) 800 MG tablet Take 1 tablet (800 mg total) by mouth every 8 (eight) hours as needed.   [name] losartan (COZAAR) 50 MG tablet Take 0.5 tablets (25 mg total) by mouth daily. 07/21/2018: Pt has not started  . metFORMIN (GLUCOPHAGE-XR) 500 MG 24 hr tablet TAKE 4 TABLETS BY MOUTH ONCE DAILY WITH BREAKFAST   . ranitidine (ZANTAC) 300 MG tablet 1/2 tab p.o. twice daily (Patient taking differently: 1/2 tab p.o. prn)   . Semaglutide (OZEMPIC) 1 MG/DOSE SOPN Inject 1 mg into the skin once a week. 12/09/2017: 0.5 mg  per patient    . Vitamin D, Ergocalciferol, (DRISDOL) 50000 units CAPS capsule Please take 1 tablet Wednesday and 1 tablet Sunday every week   . amoxicillin-clavulanate (AUGMENTIN) 875-125 MG tablet Take 1 tablet by mouth 2 (two) times daily.   . fluticasone (FLONASE) 50 MCG/ACT nasal spray Place 2 sprays into both nostrils daily.    No facility-administered encounter medications on file as of 09/16/2018.     Allergies: Lantus [insulin glargine]  Body mass index is 28.52 kg/m.  Blood pressure 130/81, pulse 79, temperature 98.7 F (37.1 C), height 5\' 6"  (1.676 m), weight 176 lb 11.2 oz (80.2 kg), SpO2 99 %.  Review of Systems  Constitutional: Positive for fatigue. Negative for activity change, appetite change, chills, diaphoresis, fever and unexpected weight change.  HENT: Positive for congestion, ear pain, facial swelling, postnasal drip, rhinorrhea, sinus pressure, sinus pain and sneezing. Negative for ear discharge, hearing loss, sore throat, trouble swallowing and voice change.   Eyes: Negative for visual disturbance.  Respiratory: Positive  for cough. Negative for chest tightness, shortness of breath, wheezing and stridor.   Cardiovascular: Negative for chest pain, palpitations and leg swelling.  Gastrointestinal: Negative for abdominal distention, abdominal pain, blood in stool, constipation, diarrhea, nausea and vomiting.  Endocrine: Negative for cold intolerance, heat intolerance, polydipsia, polyphagia and polyuria.  Genitourinary: Negative for difficulty urinating and flank pain.  Neurological: Negative for dizziness and headaches.  Hematological: Does not bruise/bleed easily.       Objective:   Physical Exam Vitals signs and nursing note reviewed.  Constitutional:      Appearance: She is obese. She is ill-appearing. She is not diaphoretic.  HENT:     Head: Normocephalic and atraumatic.     Right Ear: Hearing normal. No decreased hearing noted. Tympanic membrane is bulging. Tympanic membrane is not erythematous.     Left Ear: Hearing normal. No decreased hearing noted. Tympanic membrane is bulging. Tympanic membrane is not erythematous.  Nose: Mucosal edema, congestion and rhinorrhea present.     Right Turbinates: Swollen.     Left Turbinates: Swollen.     Right Sinus: Maxillary sinus tenderness present.     Left Sinus: Maxillary sinus tenderness and frontal sinus tenderness present.     Mouth/Throat:     Pharynx: Posterior oropharyngeal erythema present. No oropharyngeal exudate.     Tonsils: No tonsillar exudate. Swelling: 1+ on the right. 1+ on the left.  Eyes:     Extraocular Movements: Extraocular movements intact.     Conjunctiva/sclera: Conjunctivae normal.     Pupils: Pupils are equal, round, and reactive to light.  Neck:     Musculoskeletal: Normal range of motion.  Cardiovascular:     Rate and Rhythm: Normal rate.     Pulses: Normal pulses.     Heart sounds: Normal heart sounds. No murmur. No friction rub. No gallop.   Pulmonary:     Effort: Pulmonary effort is normal. No respiratory distress.      Breath sounds: Normal breath sounds. No stridor. No wheezing, rhonchi or rales.  Chest:     Chest wall: No tenderness.  Lymphadenopathy:     Cervical: No cervical adenopathy.  Skin:    General: Skin is warm and dry.     Capillary Refill: Capillary refill takes less than 2 seconds.  Neurological:     Mental Status: She is alert and oriented to person, place, and time.  Psychiatric:        Mood and Affect: Mood normal.        Behavior: Behavior normal.        Thought Content: Thought content normal.        Judgment: Judgment normal.       Assessment & Plan:   1. Acute maxillary sinusitis, recurrence not specified     Acute maxillary sinusitis Please take Augmentin as directed and Flonase as needed. Continue to push fluids and alternate OTC Acetaminophen and Ibuprofen as needed. If symptoms persist after Augmentin completed, please call clinic.    FOLLOW-UP:  Return if symptoms worsen or fail to improve.

## 2018-09-16 NOTE — Patient Instructions (Signed)

## 2018-10-01 ENCOUNTER — Other Ambulatory Visit: Payer: Self-pay | Admitting: Adult Health

## 2018-10-01 ENCOUNTER — Encounter: Payer: Self-pay | Admitting: Adult Health

## 2018-10-01 MED ORDER — DOXYCYCLINE HYCLATE 100 MG PO TABS: 100.0000 mg | ORAL_TABLET | Freq: Two times a day (BID) | ORAL | 0 refills | Status: DC

## 2018-12-07 ENCOUNTER — Other Ambulatory Visit: Payer: Self-pay | Admitting: Family Medicine

## 2018-12-07 ENCOUNTER — Encounter: Payer: Self-pay | Admitting: Family Medicine

## 2018-12-07 DIAGNOSIS — F331 Major depressive disorder, recurrent, moderate: Secondary | ICD-10-CM

## 2018-12-08 ENCOUNTER — Telehealth: Payer: Self-pay | Admitting: Family Medicine

## 2018-12-08 ENCOUNTER — Other Ambulatory Visit: Payer: Self-pay

## 2018-12-08 DIAGNOSIS — E1165 Type 2 diabetes mellitus with hyperglycemia: Secondary | ICD-10-CM

## 2018-12-08 DIAGNOSIS — F331 Major depressive disorder, recurrent, moderate: Secondary | ICD-10-CM

## 2018-12-08 MED ORDER — SEMAGLUTIDE (1 MG/DOSE) 2 MG/1.5ML ~~LOC~~ SOPN: 1.0000 mg | PEN_INJECTOR | SUBCUTANEOUS | 0 refills | Status: DC

## 2018-12-08 MED ORDER — FLUOXETINE HCL 10 MG PO TABS: 10.0000 mg | ORAL_TABLET | Freq: Every day | ORAL | 0 refills | Status: DC

## 2018-12-08 MED ORDER — BUPROPION HCL ER (XL) 300 MG PO TB24: 300.0000 mg | ORAL_TABLET | Freq: Every day | ORAL | 0 refills | Status: DC

## 2018-12-08 NOTE — Telephone Encounter (Signed)
Patient is requesting a refill of her Wellbutrin and Ozempic. If approved please send to [facility] on [address]

## 2018-12-08 NOTE — Telephone Encounter (Signed)
Sent in refills for 30 days -  Patient needs OV for refills - sent message to front desk to reach out and make appointment for patient. MPulliam, CMA/RT(R)

## 2018-12-10 LAB — HM DIABETES EYE EXAM

## 2018-12-23 ENCOUNTER — Other Ambulatory Visit: Payer: Self-pay

## 2018-12-23 ENCOUNTER — Telehealth: Payer: Self-pay | Admitting: Family Medicine

## 2018-12-23 ENCOUNTER — Encounter (HOSPITAL_BASED_OUTPATIENT_CLINIC_OR_DEPARTMENT_OTHER): Payer: Self-pay | Admitting: Emergency Medicine

## 2018-12-23 ENCOUNTER — Emergency Department (HOSPITAL_BASED_OUTPATIENT_CLINIC_OR_DEPARTMENT_OTHER): Payer: BC Managed Care – PPO

## 2018-12-23 ENCOUNTER — Inpatient Hospital Stay (HOSPITAL_BASED_OUTPATIENT_CLINIC_OR_DEPARTMENT_OTHER)
Admission: EM | Admit: 2018-12-23 | Discharge: 2018-12-26 | DRG: 419 | Disposition: A | Discharge status: Home / Self Care | Payer: BC Managed Care – PPO | Attending: Internal Medicine | Admitting: Internal Medicine

## 2018-12-23 DIAGNOSIS — Z87891 Personal history of nicotine dependence: Secondary | ICD-10-CM

## 2018-12-23 DIAGNOSIS — Z79899 Other long term (current) drug therapy: Secondary | ICD-10-CM | POA: Diagnosis not present

## 2018-12-23 DIAGNOSIS — R52 Pain, unspecified: Secondary | ICD-10-CM | POA: Diagnosis present

## 2018-12-23 DIAGNOSIS — E1165 Type 2 diabetes mellitus with hyperglycemia: Secondary | ICD-10-CM | POA: Diagnosis present

## 2018-12-23 DIAGNOSIS — Z888 Allergy status to other drugs, medicaments and biological substances status: Secondary | ICD-10-CM | POA: Diagnosis not present

## 2018-12-23 DIAGNOSIS — Z7984 Long term (current) use of oral hypoglycemic drugs: Secondary | ICD-10-CM | POA: Diagnosis not present

## 2018-12-23 DIAGNOSIS — K219 Gastro-esophageal reflux disease without esophagitis: Secondary | ICD-10-CM | POA: Diagnosis present

## 2018-12-23 DIAGNOSIS — Z9079 Acquired absence of other genital organ(s): Secondary | ICD-10-CM | POA: Diagnosis not present

## 2018-12-23 DIAGNOSIS — R112 Nausea with vomiting, unspecified: Secondary | ICD-10-CM

## 2018-12-23 DIAGNOSIS — E1169 Type 2 diabetes mellitus with other specified complication: Secondary | ICD-10-CM | POA: Diagnosis not present

## 2018-12-23 DIAGNOSIS — K8001 Calculus of gallbladder with acute cholecystitis with obstruction: Secondary | ICD-10-CM | POA: Diagnosis not present

## 2018-12-23 DIAGNOSIS — Z8249 Family history of ischemic heart disease and other diseases of the circulatory system: Secondary | ICD-10-CM | POA: Diagnosis not present

## 2018-12-23 DIAGNOSIS — F329 Major depressive disorder, single episode, unspecified: Secondary | ICD-10-CM | POA: Diagnosis present

## 2018-12-23 DIAGNOSIS — K760 Fatty (change of) liver, not elsewhere classified: Secondary | ICD-10-CM | POA: Diagnosis present

## 2018-12-23 DIAGNOSIS — R1084 Generalized abdominal pain: Secondary | ICD-10-CM

## 2018-12-23 DIAGNOSIS — R198 Other specified symptoms and signs involving the digestive system and abdomen: Secondary | ICD-10-CM | POA: Diagnosis present

## 2018-12-23 DIAGNOSIS — Z20828 Contact with and (suspected) exposure to other viral communicable diseases: Secondary | ICD-10-CM | POA: Diagnosis present

## 2018-12-23 DIAGNOSIS — Z833 Family history of diabetes mellitus: Secondary | ICD-10-CM

## 2018-12-23 DIAGNOSIS — E282 Polycystic ovarian syndrome: Secondary | ICD-10-CM | POA: Diagnosis not present

## 2018-12-23 DIAGNOSIS — Z7951 Long term (current) use of inhaled steroids: Secondary | ICD-10-CM | POA: Diagnosis not present

## 2018-12-23 DIAGNOSIS — E782 Mixed hyperlipidemia: Secondary | ICD-10-CM | POA: Diagnosis present

## 2018-12-23 DIAGNOSIS — I10 Essential (primary) hypertension: Secondary | ICD-10-CM | POA: Diagnosis present

## 2018-12-23 DIAGNOSIS — E785 Hyperlipidemia, unspecified: Secondary | ICD-10-CM | POA: Diagnosis present

## 2018-12-23 DIAGNOSIS — K810 Acute cholecystitis: Secondary | ICD-10-CM

## 2018-12-23 HISTORY — DX: Other specified postprocedural states: R11.2

## 2018-12-23 HISTORY — DX: Other specified postprocedural states: Z98.890

## 2018-12-23 LAB — URINALYSIS, ROUTINE W REFLEX MICROSCOPIC
Bilirubin Urine: NEGATIVE
Glucose, UA: >=500 mg/dL — AB
Ketones, ur: >80 mg/dL — AB
Leukocytes,Ua: NEGATIVE
Nitrite: NEGATIVE
Protein, ur: NEGATIVE mg/dL
Specific Gravity, Urine: 1.015 (ref 1.005–1.030)
pH: 6 (ref 5.0–8.0)

## 2018-12-23 LAB — CBC WITH DIFFERENTIAL/PLATELET
Abs Immature Granulocytes: 0.11 10*3/uL — ABNORMAL HIGH (ref 0.00–0.07)
Basophils Absolute: 0.1 10*3/uL (ref 0.0–0.1)
Basophils Relative: 0 %
Eosinophils Absolute: 0 10*3/uL (ref 0.0–0.5)
Eosinophils Relative: 0 %
HCT: 48.5 % — ABNORMAL HIGH (ref 36.0–46.0)
Hemoglobin: 16 g/dL — ABNORMAL HIGH (ref 12.0–15.0)
Immature Granulocytes: 1 %
Lymphocytes Relative: 9 %
Lymphs Abs: 1.4 10*3/uL (ref 0.7–4.0)
MCH: 29.3 pg (ref 26.0–34.0)
MCHC: 33 g/dL (ref 30.0–36.0)
MCV: 88.7 fL (ref 80.0–100.0)
Monocytes Absolute: 0.6 10*3/uL (ref 0.1–1.0)
Monocytes Relative: 4 %
Neutro Abs: 14.1 10*3/uL — ABNORMAL HIGH (ref 1.7–7.7)
Neutrophils Relative %: 86 %
Platelets: 210 10*3/uL (ref 150–400)
RBC: 5.47 MIL/uL — ABNORMAL HIGH (ref 3.87–5.11)
RDW: 12.7 % (ref 11.5–15.5)
WBC: 16.3 10*3/uL — ABNORMAL HIGH (ref 4.0–10.5)
nRBC: 0 % (ref 0.0–0.2)

## 2018-12-23 LAB — COMPREHENSIVE METABOLIC PANEL
ALT: 18 U/L (ref 0–44)
AST: 11 U/L — ABNORMAL LOW (ref 15–41)
Albumin: 4 g/dL (ref 3.5–5.0)
Alkaline Phosphatase: 66 U/L (ref 38–126)
Anion gap: 11 (ref 5–15)
BUN: 11 mg/dL (ref 6–20)
CO2: 22 mmol/L (ref 22–32)
Calcium: 10.4 mg/dL — ABNORMAL HIGH (ref 8.9–10.3)
Chloride: 102 mmol/L (ref 98–111)
Creatinine, Ser: 0.44 mg/dL (ref 0.44–1.00)
GFR calc Af Amer: >60 mL/min (ref >60)
GFR calc non Af Amer: >60 mL/min (ref >60)
Glucose, Bld: 254 mg/dL — ABNORMAL HIGH (ref 70–99)
Potassium: 4.2 mmol/L (ref 3.5–5.1)
Sodium: 135 mmol/L (ref 135–145)
Total Bilirubin: 1.2 mg/dL (ref 0.3–1.2)
Total Protein: 6.9 g/dL (ref 6.5–8.1)

## 2018-12-23 LAB — SARS CORONAVIRUS 2 AG (30 MIN TAT): SARS Coronavirus 2 Ag: NEGATIVE

## 2018-12-23 LAB — URINALYSIS, MICROSCOPIC (REFLEX)

## 2018-12-23 LAB — LIPASE, BLOOD: Lipase: 24 U/L (ref 11–51)

## 2018-12-23 MED ORDER — IOHEXOL 300 MG/ML  SOLN
100.0000 mL | Freq: Once | INTRAMUSCULAR | Status: AC | PRN
  Administered 2018-12-23: 100 mL via INTRAVENOUS

## 2018-12-23 MED ORDER — SODIUM CHLORIDE 0.9 % IV BOLUS
1000.0000 mL | Freq: Once | INTRAVENOUS | Status: AC
  Administered 2018-12-23: 1000 mL via INTRAVENOUS

## 2018-12-23 MED ORDER — SODIUM CHLORIDE 0.9 % IV SOLN
Freq: Once | INTRAVENOUS | Status: AC
  Administered 2018-12-24: 02:00:00 via INTRAVENOUS

## 2018-12-23 MED ORDER — ONDANSETRON HCL 4 MG/2ML IJ SOLN
4.0000 mg | Freq: Once | INTRAMUSCULAR | Status: AC
  Administered 2018-12-23: 20:00:00 4 mg via INTRAVENOUS
  Filled 2018-12-23: qty 2

## 2018-12-23 MED ORDER — ONDANSETRON HCL 4 MG/2ML IJ SOLN
4.0000 mg | Freq: Once | INTRAMUSCULAR | Status: AC
  Administered 2018-12-23: 23:00:00 4 mg via INTRAVENOUS
  Filled 2018-12-23: qty 2

## 2018-12-23 MED ORDER — FENTANYL CITRATE (PF) 100 MCG/2ML IJ SOLN
50.0000 ug | Freq: Once | INTRAMUSCULAR | Status: AC
  Administered 2018-12-23: 50 ug via INTRAVENOUS
  Filled 2018-12-23: qty 2

## 2018-12-23 NOTE — ED Notes (Signed)
Patient transported to Ultrasound 

## 2018-12-23 NOTE — ED Notes (Signed)
ED Provider at bedside. 

## 2018-12-23 NOTE — ED Triage Notes (Addendum)
Pt reports n/v/d since yesterday at 10pm. Pt reports taking a medication (Ozempic) yesterday that she has not taken in a while. Pt states she had similar symptoms with this medication previously.

## 2018-12-23 NOTE — Telephone Encounter (Signed)
Patient states that she has not taken in abt 2 months.  Patient took medication yesterday around noon and starting last night she started having heartburn, diarrhea, nausea and vomiting, patient also has had fever of 100.  N/V every hour since last night. Patient is only able to keep down water. Advised patient to go to the [facility] [place]. MPulliam, CMA/RT(R)

## 2018-12-23 NOTE — Telephone Encounter (Signed)
Patient called states she thinks she is having a reaction to this injection :   Semaglutide, 1 MG/DOSE, (OZEMPIC, 1 MG/DOSE,) 2 MG/1.5ML SOPN [[id number]]   Order Details Dose: 1 mg Route: Subcutaneous Frequency: Weekly  Dispense Quantity: 4 pen Refills: 0 Fills remaining: --        Sig: Inject 1 mg into the skin once a week. Needs office visit for refills          ---Forwarding URGENT message to medical assistant to call pt w/ advice-- ( she states doesn't want to go to ED) ---   glh

## 2018-12-23 NOTE — ED Notes (Signed)
Patient transported to CT 

## 2018-12-23 NOTE — ED Provider Notes (Signed)
[facility] EMERGENCY DEPARTMENT Provider Note   CSN: [id number] Arrival date & time: 12/23/18  1903    History   Chief Complaint Chief Complaint  Patient presents with   Emesis    HPI Lindsey Mason is a 48 y.o. female.     The history is provided by the patient.  Emesis Severity:  Mild Timing:  Constant Able to tolerate:  Liquids Progression:  Unchanged Chronicity:  New Context comment:  New diabetic medicine, started N/V/D and abdominal distention afterwards. Hx of same with this medicine.  Relieved by:  Nothing Worsened by:  Nothing Associated symptoms: abdominal pain and diarrhea   Associated symptoms: no arthralgias, no chills, no cough, no fever and no sore throat   Risk factors: prior abdominal surgery     Past Medical History:  Diagnosis Date   Clomid pregnancy 1997, 2005   Depression    Diabetes mellitus without mention of complication    Esophageal reflux    Headache(784.0)    Infertility, female    PCOS - Clomid pregnancies    Ovarian cyst 2019   PCOS (polycystic ovarian syndrome) 05/27/2012   Pure hypercholesterolemia    Rosacea     Patient Active Problem List   Diagnosis Date Noted   Statin declined 05/05/2018   Bloating 03/10/2018   Alternating constipation and diarrhea 03/10/2018   Pyrosis 03/10/2018   Abnormal liver ultrasound 03/10/2018   Calculus of gallbladder without cholecystitis without obstruction 03/10/2018   Type 2 diabetes mellitus ([place]) 02/06/2018   h/o Enlarged ovaries 11/20/2017   Status post appendectomy 11/19/2017   Environmental and seasonal allergies 11/04/2017   Psychophysiological insomnia 11/04/2017   Vitamin D deficiency 08/26/2017   Mixed diabetic hyperlipidemia associated with type 2 diabetes mellitus ([place]) 08/26/2017   Depression 09/03/2016   Overweight (BMI 25.0-29.9) 09/03/2016   Witnessed episode of apnea 09/03/2016   Urinary frequency 04/03/2016   Acute maxillary  sinusitis 11/04/2014   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   PCOS (polycystic ovarian syndrome) 05/27/2012   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   Major depressive disorder, recurrent episode ([place]) 08/20/2010   Hyperlipidemia associated with type 2 diabetes mellitus ([place]) 04/11/2009   ACNE ROSACEA 04/11/2009   DM II (diabetes mellitus, type II), controlled ([place]) 03/08/2009   GERD 03/08/2009    Past Surgical History:  Procedure Laterality Date   APPENDECTOMY     CESAREAN SECTION     x 3, last one with triplets   LAPAROSCOPIC BILATERAL SALPINGECTOMY Right 02/16/2018   Procedure: LAPAROSCOPIC RIGHT SALPINGECTOMY;  Surgeon: Megan Salon, MD;  [facility];  Service: Gynecology;  Laterality: Right;   TUBAL LIGATION       OB History    Gravida  3   Para  3   Term  2   Preterm  1   AB      Living  5     SAB      TAB      Ectopic      Multiple  1   Live Births  5            Home Medications    Prior to Admission medications   Medication Sig Start Date End Date Taking? Authorizing Provider  buPROPion (WELLBUTRIN XL) 300 MG 24 hr tablet Take 1 tablet (300 mg total) by mouth daily. Needs office visit for refills 12/08/18   OpalskiNeoma Laming, DO  chlorpheniramine-HYDROcodone (TUSSIONEX) 10-8 MG/5ML SUER Take 5 mLs by mouth every  12 (twelve) hours as needed for cough (cough, will cause drowsiness.). 07/21/18   Opalski, Neoma Laming, DO  doxycycline (VIBRA-TABS) 100 MG tablet Take 1 tablet (100 mg total) by mouth 2 (two) times daily. 10/01/18   Danford, Valetta Fuller D, NP  FLUoxetine (PROZAC) 10 MG tablet Take 1 tablet (10 mg total) by mouth daily for 30 days. Needs office visit for refills 12/08/18 01/07/19  Mellody Dance, DO  fluticasone [facility]) 50 MCG/ACT nasal spray 1 spray each nostril after sinus rinse twice daily 10/20/17   Opalski, Neoma Laming, DO  fluticasone (FLONASE) 50 MCG/ACT nasal spray Place 2 sprays into both nostrils daily.  09/16/18   Danford, Valetta Fuller D, NP  ibuprofen (ADVIL,MOTRIN) 800 MG tablet Take 1 tablet (800 mg total) by mouth every 8 (eight) hours as needed. 02/16/18   Megan Salon, MD  losartan (COZAAR) 50 MG tablet Take 0.5 tablets (25 mg total) by mouth daily. 05/05/18   Opalski, Neoma Laming, DO  metFORMIN (GLUCOPHAGE-XR) 500 MG 24 hr tablet TAKE 4 TABLETS BY MOUTH ONCE DAILY WITH BREAKFAST 05/05/18   Opalski, Neoma Laming, DO  ranitidine (ZANTAC) 300 MG tablet 1/2 tab p.o. twice daily Patient taking differently: 1/2 tab p.o. prn 01/27/18   Opalski, Deborah, DO  Semaglutide, 1 MG/DOSE, (OZEMPIC, 1 MG/DOSE,) 2 MG/1.5ML SOPN Inject 1 mg into the skin once a week. Needs office visit for refills 12/08/18   Mellody Dance, DO  Vitamin D, Ergocalciferol, (DRISDOL) 50000 units CAPS capsule Please take 1 tablet Wednesday and 1 tablet Sunday every week 05/05/18   Mellody Dance, DO    Family History Family History  Problem Relation Age of Onset   Cancer Mother        breast   Heart disease Mother    Stroke Mother    Diabetes Mother    Diabetes Father    Breast cancer Maternal Aunt    Colon cancer Neg Hx    Inflammatory bowel disease Neg Hx    Liver disease Neg Hx    Pancreatic cancer Neg Hx    Stomach cancer Neg Hx    Esophageal cancer Neg Hx     Social History Social History   Tobacco Use   Smoking status: Former Smoker    Packs/day: 1.00    Years: 15.00    Pack years: 15.00    Types: Cigarettes    Quit date: 03/04/2004    Years since quitting: 14.8   Smokeless tobacco: Never Used  Substance Use Topics   Alcohol use: Yes    Comment: occ   Drug use: No     Allergies   Lantus [insulin glargine]   Review of Systems Review of Systems  Constitutional: Negative for chills and fever.  HENT: Negative for ear pain and sore throat.   Eyes: Negative for pain and visual disturbance.  Respiratory: Negative for cough and shortness of breath.   Cardiovascular: Negative for chest pain and  palpitations.  Gastrointestinal: Positive for abdominal distention, abdominal pain, diarrhea, nausea and vomiting.  Genitourinary: Negative for dysuria and hematuria.  Musculoskeletal: Negative for arthralgias and back pain.  Skin: Negative for color change and rash.  Neurological: Negative for seizures and syncope.  All other systems reviewed and are negative.    Physical Exam Updated Vital Signs  ED Triage Vitals  Enc Vitals Group     BP 12/23/18 1922 (!) 153/85     Pulse Rate 12/23/18 1922 (!) 115     Resp 12/23/18 1922 18     Temp 12/23/18 1955  99.3 F (37.4 C)     Temp Source 12/23/18 1955 Oral     SpO2 12/23/18 1922 100 %     Weight 12/23/18 1923 170 lb (77.1 kg)     Height 12/23/18 1923 5\' 6"  (1.676 m)     Head Circumference --      Peak Flow --      Pain Score 12/23/18 1922 8     Pain Loc --      Pain Edu? --      Excl. in [place]? --     Physical Exam Vitals signs and nursing note reviewed.  Constitutional:      General: She is not in acute distress.    Appearance: She is well-developed. She is not ill-appearing.  HENT:     Head: Normocephalic and atraumatic.     Nose: Nose normal.     Mouth/Throat:     Mouth: Mucous membranes are moist.  Eyes:     Extraocular Movements: Extraocular movements intact.     Conjunctiva/sclera: Conjunctivae normal.     Pupils: Pupils are equal, round, and reactive to light.  Neck:     Musculoskeletal: Normal range of motion and neck supple.  Cardiovascular:     Rate and Rhythm: Normal rate and regular rhythm.     Pulses: Normal pulses.     Heart sounds: Normal heart sounds. No murmur.  Pulmonary:     Effort: Pulmonary effort is normal. No respiratory distress.     Breath sounds: Normal breath sounds.  Abdominal:     General: There is distension.     Palpations: Abdomen is soft. There is no mass.     Tenderness: There is abdominal tenderness. There is no guarding or rebound.     Hernia: No hernia is present.  Skin:     General: Skin is warm and dry.     Capillary Refill: Capillary refill takes less than 2 seconds.  Neurological:     General: No focal deficit present.     Mental Status: She is alert.      ED Treatments / Results  Labs (all labs ordered are listed, but only abnormal results are displayed) Labs Reviewed  CBC WITH DIFFERENTIAL/PLATELET - Abnormal; Notable for the following components:      Result Value   WBC 16.3 (*)    RBC 5.47 (*)    Hemoglobin 16.0 (*)    HCT 48.5 (*)    Neutro Abs 14.1 (*)    Abs Immature Granulocytes 0.11 (*)    All other components within normal limits  URINALYSIS, ROUTINE W REFLEX MICROSCOPIC - Abnormal; Notable for the following components:   Glucose, UA >=500 (*)    Hgb urine dipstick TRACE (*)    Ketones, ur >80 (*)    All other components within normal limits  COMPREHENSIVE METABOLIC PANEL - Abnormal; Notable for the following components:   Glucose, Bld 254 (*)    Calcium 10.4 (*)    AST 11 (*)    All other components within normal limits  URINALYSIS, MICROSCOPIC (REFLEX) - Abnormal; Notable for the following components:   Bacteria, UA RARE (*)    All other components within normal limits  SARS CORONAVIRUS 2 (HOSP ORDER, PERFORMED IN [place] LAB VIA ABBOTT ID)  LIPASE, BLOOD    EKG None  Radiology Ct Abdomen Pelvis W Contrast  Result Date: 12/23/2018 CLINICAL DATA:  Abdominal distension. EXAM: CT ABDOMEN AND PELVIS WITH CONTRAST TECHNIQUE: Multidetector CT imaging of the abdomen  and pelvis was performed using the standard protocol following bolus administration of intravenous contrast. CONTRAST:  168mL OMNIPAQUE IOHEXOL 300 MG/ML  SOLN COMPARISON:  None. FINDINGS: Lower chest: No acute abnormality. Hepatobiliary: There is hepatic steatosis. The gallbladder is significantly distended. There is some asymmetric wall thickening of the gallbladder. There is no intrahepatic or extrahepatic biliary ductal dilatation. Pancreas: Unremarkable. No  pancreatic ductal dilatation or surrounding inflammatory changes. Spleen: Normal in size without focal abnormality. Adrenals/Urinary Tract: Adrenal glands are unremarkable. Kidneys are normal, without renal calculi, focal lesion, or hydronephrosis. The bladder is moderately distended. Stomach/Bowel: The stomach is moderately distended. There is scattered diverticula without CT evidence of diverticulitis. The appendix is not reliably identified. The patient appears to be status post appendectomy. Vascular/Lymphatic: No significant vascular findings are present. No enlarged abdominal or pelvic lymph nodes. Reproductive: The patient appears to be status post bilateral tubal ligation. One of the tubal ligation clips appears malpositioned and is no longer located in the expected region of the right adnexa. Other: No abdominal wall hernia or abnormality. No abdominopelvic ascites. Musculoskeletal: No acute or significant osseous findings. IMPRESSION: 1. Distended gallbladder with some asymmetric wall thickening. If there is clinical concern for acute cholecystitis, follow-up with ultrasound is recommended. 2. Moderately distended urinary bladder. 3. Distended stomach of unknown clinical significance. 4. Probable malpositioning of the patient's right-sided tubal ligation clip. 5. Hepatic steatosis 6. Scattered colonic diverticula without CT evidence of diverticulitis. There is no evidence of a small-bowel obstruction. The patient appears to be status post prior appendectomy. Electronically Signed   By: Constance Holster M.D.   On: 12/23/2018 21:48   US Abdomen Limited  Result Date: 12/23/2018 CLINICAL DATA:  Nausea and vomiting. EXAM: ULTRASOUND ABDOMEN LIMITED RIGHT UPPER QUADRANT COMPARISON:  CT dated FINDINGS: Gallbladder: Cholelithiasis is noted. There is a single focal area of gallbladder wall thickening measuring up to approximately 3-4 mm. Multiple gallstones are noted near the gallbladder neck. Several these  gallstones appear to be nonmobile. The sonographic Percell Miller sign cannot be adequately assessed secondary to patient condition. Common bile duct: Diameter: 0.4 cm Liver: Diffuse increased echogenicity with slightly heterogeneous liver. Appearance typically secondary to fatty infiltration. Fibrosis secondary consideration. No secondary findings of cirrhosis noted. No focal hepatic lesion or intrahepatic biliary duct dilatation. Portal vein is patent on color Doppler imaging with normal direction of blood flow towards the liver. IMPRESSION: 1. Cholelithiasis with a focal area of gallbladder wall thickening. Multiple gallstones are noted near the gallbladder neck. Many of the stones appear to be nonmobile. The sonographic Percell Miller sign cannot be adequately assessed secondary to patient condition. If there is high clinical suspicion for acute cholecystitis, follow-up with HIDA scan or surgical consultation is recommended. 2. The common bile duct is not dilated. 3. Hepatic steatosis. Electronically Signed   By: Constance Holster M.D.   On: 12/23/2018 22:54    Procedures Procedures (including critical care time)  Medications Ordered in ED Medications  0.9 %  sodium chloride infusion (has no administration in time range)  sodium chloride 0.9 % bolus 1,000 mL (0 mLs Intravenous Stopped 12/23/18 2106)  ondansetron (ZOFRAN) injection 4 mg (4 mg Intravenous Given 12/23/18 1959)  iohexol (OMNIPAQUE) 300 MG/ML solution 100 mL (100 mLs Intravenous Contrast Given 12/23/18 2137)  sodium chloride 0.9 % bolus 1,000 mL ( Intravenous Stopped 12/23/18 2311)  fentaNYL (SUBLIMAZE) injection 50 mcg (50 mcg Intravenous Given 12/23/18 2210)  ondansetron (ZOFRAN) injection 4 mg (4 mg Intravenous Given 12/23/18 2309)  Initial Impression / Assessment and Plan / ED Course  I have reviewed the triage vital signs and the nursing notes.  Pertinent labs & imaging results that were available during my care of the patient were reviewed  by me and considered in my medical decision making (see chart for details).     Lindsey Mason is a 48 year old female with history of diabetes, high cholesterol presents the ED with nausea, vomiting.  Patient with unremarkable vitals.  No fever.  Patient states that she took her dose of ozempic today and has had some nausea, and vomiting diarrhea since.  She has been off this medication for several months.  When she first started taking this medicine for diabetes she had a similar reaction.  Patient feels bloated and distended in her abdomen.  Does not have major tenderness.  Patient overall well-appearing.  But has had difficulty holding down fluids.  Will hydrate with IV fluids, give IV Zofran.  Will evaluate with basic labs.  Patient with leukocytosis of about 17.  Given tachycardia initially will get a CT scan to rule out intra-abdominal process.  Suspect this is a stress reaction from nausea and vomiting.  Patient otherwise with no significant lab findings.  No kidney injury.  Glucose mildly elevated.  However normal bicarb and anion gap.  Urinalysis consistent with mild dehydration with positive ketones.  CT scan showed possible acute cholecystitis.  However gallbladder and liver enzymes and lipase within normal limits.  Will get a right upper quadrant ultrasound.  Will give additional IV fluids, pain medicine.  Right upper quadrant ultrasound shows gallstones with a focal area of gallbladder wall thickening.  There are multiple gallstones.  Common bile duct is nondilated.  Overall equivocal study.  Radiology recommends HIDA scan.  Patient still tachycardic.  Having more diffuse abdominal tenderness.  Will admit to [facility] long for HIDA scan but do suspect biliary source developing for pain. Possibly medication side effect.   Hospitalist to admit.  Talked with Dr. Johney Maine with general surgery who recommends HIDA scan.  He states that general surgery can be involved if HIDA scan is positive. No need for  consult at this time. At this time her history and physical is not overwhelmingly consistent with acute cholecystitis.  Could possibly be medication side effect or gastroparesis.  Dr. Johney Maine recommends pursuing HIDA scan and optimizing her blood sugar.  If patient does have any decompensation or becomes more clear that this could be her gallbladder they are more than happy to see the patient in the morning or sooner.  This chart was dictated using voice recognition software.  Despite best efforts to proofread,  errors can occur which can change the documentation meaning.    Final Clinical Impressions(s) / ED Diagnoses   Final diagnoses:  Pain  Nausea and vomiting, intractability of vomiting not specified, unspecified vomiting type  Generalized abdominal pain    ED Discharge Orders    None       Lennice Sites, DO 12/24/18 0000

## 2018-12-23 NOTE — Telephone Encounter (Signed)
Called patient no answer left message for patient to call the office. MPulliam, CMA/RT(R)

## 2018-12-24 ENCOUNTER — Encounter (HOSPITAL_BASED_OUTPATIENT_CLINIC_OR_DEPARTMENT_OTHER): Payer: Self-pay | Admitting: Surgery

## 2018-12-24 ENCOUNTER — Observation Stay (HOSPITAL_COMMUNITY): Payer: BC Managed Care – PPO

## 2018-12-24 DIAGNOSIS — R1084 Generalized abdominal pain: Secondary | ICD-10-CM

## 2018-12-24 DIAGNOSIS — R112 Nausea with vomiting, unspecified: Secondary | ICD-10-CM

## 2018-12-24 DIAGNOSIS — E1165 Type 2 diabetes mellitus with hyperglycemia: Secondary | ICD-10-CM

## 2018-12-24 LAB — COMPREHENSIVE METABOLIC PANEL
ALT: 15 U/L (ref 0–44)
AST: 9 U/L — ABNORMAL LOW (ref 15–41)
Albumin: 3.4 g/dL — ABNORMAL LOW (ref 3.5–5.0)
Alkaline Phosphatase: 54 U/L (ref 38–126)
Anion gap: 5 (ref 5–15)
BUN: 9 mg/dL (ref 6–20)
CO2: 23 mmol/L (ref 22–32)
Calcium: 9.5 mg/dL (ref 8.9–10.3)
Chloride: 109 mmol/L (ref 98–111)
Creatinine, Ser: 0.41 mg/dL — ABNORMAL LOW (ref 0.44–1.00)
GFR calc Af Amer: >60 mL/min (ref >60)
GFR calc non Af Amer: >60 mL/min (ref >60)
Glucose, Bld: 249 mg/dL — ABNORMAL HIGH (ref 70–99)
Potassium: 3.8 mmol/L (ref 3.5–5.1)
Sodium: 137 mmol/L (ref 135–145)
Total Bilirubin: 1 mg/dL (ref 0.3–1.2)
Total Protein: 6.1 g/dL — ABNORMAL LOW (ref 6.5–8.1)

## 2018-12-24 LAB — GLUCOSE, CAPILLARY
Glucose-Capillary: 238 mg/dL — ABNORMAL HIGH (ref 70–99)
Glucose-Capillary: 238 mg/dL — ABNORMAL HIGH (ref 70–99)
Glucose-Capillary: 219 mg/dL — ABNORMAL HIGH (ref 70–99)
Glucose-Capillary: 150 mg/dL — ABNORMAL HIGH (ref 70–99)
Glucose-Capillary: 123 mg/dL — ABNORMAL HIGH (ref 70–99)
Glucose-Capillary: 107 mg/dL — ABNORMAL HIGH (ref 70–99)

## 2018-12-24 LAB — CBC
HCT: 41.3 % (ref 36.0–46.0)
Hemoglobin: 13.2 g/dL (ref 12.0–15.0)
MCH: 29 pg (ref 26.0–34.0)
MCHC: 32 g/dL (ref 30.0–36.0)
MCV: 90.8 fL (ref 80.0–100.0)
Platelets: 235 10*3/uL (ref 150–400)
RBC: 4.55 MIL/uL (ref 3.87–5.11)
RDW: 12.8 % (ref 11.5–15.5)
WBC: 12.8 10*3/uL — ABNORMAL HIGH (ref 4.0–10.5)
nRBC: 0 % (ref 0.0–0.2)

## 2018-12-24 LAB — HIV ANTIBODY (ROUTINE TESTING W REFLEX): HIV Screen 4th Generation wRfx: NONREACTIVE

## 2018-12-24 MED ORDER — ACETAMINOPHEN 325 MG PO TABS
650.0000 mg | ORAL_TABLET | Freq: Four times a day (QID) | ORAL | Status: DC | PRN
  Administered 2018-12-24: 13:00:00 650 mg via ORAL
  Filled 2018-12-24: qty 2

## 2018-12-24 MED ORDER — CIPROFLOXACIN IN D5W 400 MG/200ML IV SOLN
400.0000 mg | Freq: Two times a day (BID) | INTRAVENOUS | Status: DC
  Administered 2018-12-24 – 2018-12-25 (×2): 400 mg via INTRAVENOUS
  Filled 2018-12-24 (×2): qty 200

## 2018-12-24 MED ORDER — TECHNETIUM TC 99M MEBROFENIN IV KIT
5.1500 | PACK | Freq: Once | INTRAVENOUS | Status: AC | PRN
  Administered 2018-12-24: 10:00:00 5.15 via INTRAVENOUS

## 2018-12-24 MED ORDER — SODIUM CHLORIDE 0.9 % IV SOLN
INTRAVENOUS | Status: AC
  Administered 2018-12-24: 13:00:00 via INTRAVENOUS

## 2018-12-24 MED ORDER — KETOROLAC TROMETHAMINE 30 MG/ML IJ SOLN
30.0000 mg | Freq: Three times a day (TID) | INTRAMUSCULAR | Status: DC | PRN
  Administered 2018-12-24 – 2018-12-26 (×4): 30 mg via INTRAVENOUS
  Filled 2018-12-24 (×4): qty 1

## 2018-12-24 MED ORDER — BUPROPION HCL ER (XL) 300 MG PO TB24
300.0000 mg | ORAL_TABLET | Freq: Every day | ORAL | Status: DC
  Administered 2018-12-24 – 2018-12-26 (×3): 300 mg via ORAL
  Filled 2018-12-24 (×3): qty 1

## 2018-12-24 MED ORDER — ONDANSETRON HCL 4 MG/2ML IJ SOLN
4.0000 mg | Freq: Four times a day (QID) | INTRAMUSCULAR | Status: DC | PRN
  Administered 2018-12-25 (×2): 4 mg via INTRAVENOUS
  Filled 2018-12-24 (×2): qty 2

## 2018-12-24 MED ORDER — MORPHINE SULFATE (PF) 4 MG/ML IV SOLN
INTRAVENOUS | Status: AC
  Filled 2018-12-24: qty 1

## 2018-12-24 MED ORDER — INSULIN ASPART 100 UNIT/ML ~~LOC~~ SOLN
0.0000 [IU] | SUBCUTANEOUS | Status: DC
  Administered 2018-12-24 (×2): 1 [IU] via SUBCUTANEOUS
  Administered 2018-12-24 – 2018-12-25 (×4): 3 [IU] via SUBCUTANEOUS
  Administered 2018-12-25: 2 [IU] via SUBCUTANEOUS
  Administered 2018-12-25: 1 [IU] via SUBCUTANEOUS
  Administered 2018-12-25: 3 [IU] via SUBCUTANEOUS
  Administered 2018-12-25 (×2): 1 [IU] via SUBCUTANEOUS
  Administered 2018-12-26: 2 [IU] via SUBCUTANEOUS
  Administered 2018-12-26: 1 [IU] via SUBCUTANEOUS
  Administered 2018-12-26 (×2): 2 [IU] via SUBCUTANEOUS

## 2018-12-24 MED ORDER — ENOXAPARIN SODIUM 40 MG/0.4ML ~~LOC~~ SOLN
40.0000 mg | Freq: Every day | SUBCUTANEOUS | Status: DC
  Administered 2018-12-24: 40 mg via SUBCUTANEOUS
  Filled 2018-12-24: qty 0.4

## 2018-12-24 MED ORDER — FENTANYL CITRATE (PF) 100 MCG/2ML IJ SOLN
50.0000 ug | Freq: Once | INTRAMUSCULAR | Status: AC
  Administered 2018-12-24: 50 ug via INTRAVENOUS
  Filled 2018-12-24: qty 2

## 2018-12-24 MED ORDER — MORPHINE SULFATE (PF) 4 MG/ML IV SOLN: 3.0000 mg | Freq: Once | INTRAVENOUS | Status: DC

## 2018-12-24 MED ORDER — PANTOPRAZOLE SODIUM 40 MG PO TBEC
40.0000 mg | DELAYED_RELEASE_TABLET | Freq: Every day | ORAL | Status: DC
  Administered 2018-12-24 – 2018-12-26 (×3): 40 mg via ORAL
  Filled 2018-12-24 (×3): qty 1

## 2018-12-24 MED ORDER — SODIUM CHLORIDE 0.9 % IV SOLN: INTRAVENOUS | Status: AC

## 2018-12-24 MED ORDER — ACETAMINOPHEN 650 MG RE SUPP
650.0000 mg | Freq: Four times a day (QID) | RECTAL | Status: DC | PRN
  Filled 2018-12-24: qty 1

## 2018-12-24 MED ORDER — FLUTICASONE PROPIONATE 50 MCG/ACT NA SUSP
1.0000 | Freq: Every day | NASAL | Status: DC
  Administered 2018-12-26: 1 via NASAL
  Filled 2018-12-24: qty 16

## 2018-12-24 NOTE — H&P (Signed)
TRH H&P    Patient Demographics:    Lindsey Mason, is a 48 y.o. female  MRN: [id number]  DOB - May 19, 1971  Admit Date - 12/23/2018  Referring MD/NP/PA:  Lennice Sites  Outpatient Primary MD for the patient is Mellody Dance, DO  Patient coming from:  home  Chief complaint-  N/v, abdominal pain   HPI:    Lindsey Mason  is a 48 y.o. female, w Depression, Hyperlipidemia, Dm2, Jerrye Bushy, Pcos apparently c/o n/v, abdominal pain since Tuesday nite associated with low grade temp but not exceeding 100 per pt.  Pt denies cp, palp, sob, brbpr, black stool, dysuria, hematuria.  Pt notes that she restarted her Ozempic which has caused nausea and vomitting in the past.    In, ED,  T 99.6, P 109  R 18, Bp 139/77 pox 99%   Wt 78.2 kg  Wbc 16.3, Hgb 16.0, Plt 210  Na 135, K 4.2, Bun 11, Creatinine 0.44 Ast 11, Alt 18, Alk phos 66. T. Bili 1.2 Lipase 24 Glucose 254  CT scan abd/ pelvis IMPRESSION: 1. Distended gallbladder with some asymmetric wall thickening. If there is clinical concern for acute cholecystitis, follow-up with ultrasound is recommended. 2. Moderately distended urinary bladder. 3. Distended stomach of unknown clinical significance. 4. Probable malpositioning of the patient's right-sided tubal ligation clip. 5. Hepatic steatosis 6. Scattered colonic diverticula without CT evidence of diverticulitis. There is no evidence of a small-bowel obstruction. The patient appears to be status post prior appendectomy.  RUQ ultrasound IMPRESSION: 1. Cholelithiasis with a focal area of gallbladder wall thickening. Multiple gallstones are noted near the gallbladder neck. Many of the stones appear to be nonmobile. The sonographic Percell Miller sign cannot be adequately assessed secondary to patient condition. If there is high clinical suspicion for acute cholecystitis, follow-up with HIDA scan or surgical consultation is  recommended. 2. The common bile duct is not dilated. 3. Hepatic steatosis.  ED spoke with Dr. Johney Maine from surgery as documented in ED note. Recommended HIDA and call surgery if positive.   Pt will be admitted for n/v, abdpain, secondary to ? Cholecystitis vs Ozempic.      Review of systems:    In addition to the HPI above,   No Headache, No changes with Vision or hearing, No problems swallowing food or Liquids, No Chest pain, Cough or Shortness of Breath, + loose stool No Blood in stool or Urine, No dysuria, No new skin rashes or bruises, No new joints pains-aches,  No new weakness, tingling, numbness in any extremity, No recent weight gain or loss, No polyuria, polydypsia or polyphagia, No significant Mental Stressors.  All other systems reviewed and are negative.    Past History of the following :    Past Medical History:  Diagnosis Date   Clomid pregnancy 1997, 2005   Depression    Diabetes mellitus without mention of complication    Esophageal reflux    Headache(784.0)    Infertility, female    PCOS - Clomid pregnancies    Ovarian cyst 2019   PCOS (  polycystic ovarian syndrome) 05/27/2012   Pure hypercholesterolemia    Rosacea    Status post appendectomy 11/19/2017      Past Surgical History:  Procedure Laterality Date   APPENDECTOMY     CESAREAN SECTION     x 3, last one with triplets   LAPAROSCOPIC BILATERAL SALPINGECTOMY Right 02/16/2018   Procedure: LAPAROSCOPIC RIGHT SALPINGECTOMY;  Surgeon: Megan Salon, MD;  Location: [facility];  Service: Gynecology;  Laterality: Right;   TUBAL LIGATION        Social History:      Social History   Tobacco Use   Smoking status: Former Smoker    Packs/day: 1.00    Years: 15.00    Pack years: 15.00    Types: Cigarettes    Quit date: 03/04/2004    Years since quitting: 14.8   Smokeless tobacco: Never Used  Substance Use Topics   Alcohol use: Yes    Comment: occ        Family History :     Family History  Problem Relation Age of Onset   Cancer Mother        breast   Heart disease Mother    Stroke Mother    Diabetes Mother    Diabetes Father    Breast cancer Maternal Aunt    Colon cancer Neg Hx    Inflammatory bowel disease Neg Hx    Liver disease Neg Hx    Pancreatic cancer Neg Hx    Stomach cancer Neg Hx    Esophageal cancer Neg Hx        Home Medications:   Prior to Admission medications   Medication Sig Start Date End Date Taking? Authorizing Provider  ibuprofen (ADVIL) 200 MG tablet Take 400 mg by mouth every 6 (six) hours as needed for moderate pain.   Yes [provider]  ranitidine (ZANTAC) 300 MG tablet 1/2 tab p.o. twice daily Patient taking differently: Take 300 mg by mouth 2 (two) times daily as needed for heartburn.  01/27/18  Yes Opalski, Neoma Laming, DO  buPROPion (WELLBUTRIN XL) 300 MG 24 hr tablet Take 1 tablet (300 mg total) by mouth daily. Needs office visit for refills Patient taking differently: Take 300 mg by mouth daily.  12/08/18   Opalski, Neoma Laming, DO  chlorpheniramine-HYDROcodone (TUSSIONEX) 10-8 MG/5ML SUER Take 5 mLs by mouth every 12 (twelve) hours as needed for cough (cough, will cause drowsiness.). Patient not taking: Reported on 12/24/2018 07/21/18   Mellody Dance, DO  doxycycline (VIBRA-TABS) 100 MG tablet Take 1 tablet (100 mg total) by mouth 2 (two) times daily. Patient not taking: Reported on 12/24/2018 10/01/18   Mina Marble D, NP  FLUoxetine (PROZAC) 10 MG tablet Take 1 tablet (10 mg total) by mouth daily for 30 days. Needs office visit for refills Patient not taking: Reported on 12/24/2018 12/08/18 01/07/19  Mellody Dance, DO  fluticasone (FLONASE) 50 MCG/ACT nasal spray 1 spray each nostril after sinus rinse twice daily Patient taking differently: Place 1 spray into both nostrils daily.  10/20/17   Opalski, Deborah, DO  fluticasone (FLONASE) 50 MCG/ACT nasal spray Place 2 sprays into  both nostrils daily. Patient not taking: Reported on 12/24/2018 09/16/18   Mina Marble D, NP  ibuprofen (ADVIL,MOTRIN) 800 MG tablet Take 1 tablet (800 mg total) by mouth every 8 (eight) hours as needed. Patient not taking: Reported on 12/24/2018 02/16/18   Megan Salon, MD  losartan (COZAAR) 50 MG tablet Take 0.5 tablets (25 mg  total) by mouth daily. Patient not taking: Reported on 12/24/2018 05/05/18   Mellody Dance, DO  metFORMIN (GLUCOPHAGE-XR) 500 MG 24 hr tablet TAKE 4 TABLETS BY MOUTH ONCE DAILY WITH BREAKFAST Patient taking differently: Take 2,000 mg by mouth daily with breakfast.  05/05/18   Opalski, Neoma Laming, DO  Semaglutide, 1 MG/DOSE, (OZEMPIC, 1 MG/DOSE,) 2 MG/1.5ML SOPN Inject 1 mg into the skin once a week. Needs office visit for refills Patient taking differently: Inject 1 mg into the skin every Tuesday.  12/08/18   Mellody Dance, DO  Vitamin D, Ergocalciferol, (DRISDOL) 50000 units CAPS capsule Please take 1 tablet Wednesday and 1 tablet Sunday every week Patient not taking: Reported on 12/24/2018 05/05/18   Mellody Dance, DO     Allergies:     Allergies  Allergen Reactions   Lantus [Insulin Glargine] Other (See Comments)    headaches     Physical Exam:   Vitals  Blood pressure 139/77, pulse (!) 109, temperature 99.6 F (37.6 C), temperature source Oral, resp. rate 18, height '5\' 6"'  (1.676 m), weight 78.2 kg, SpO2 99 %.  1.  General: axox3  2. Psychiatric: euthymic  3. Neurologic: cn2-12 intact, reflexes 2+ symmetric, diffuse with no clonus, motor 5/5 in all 4 ext  4. HEENMT:  Anicteric, pupils 1.58m symmetric, direct, consensual, near intact  5. Respiratory : CTAB  6. Cardiovascular : rrr s1, s2, no m/g/r   7. Gastrointestinal:  Abd: soft, obese, nt, negative murphy, + normoactive bs  8. Skin:  Ext: no c/c/e, no rash  9.Musculoskeletal:  Good ROM  No adenopathy    Data Review:    CBC Recent Labs  Lab 12/23/18 1934  WBC 16.3*  HGB  16.0*  HCT 48.5*  PLT 210  MCV 88.7  MCH 29.3  MCHC 33.0  RDW 12.7  LYMPHSABS 1.4  MONOABS 0.6  EOSABS 0.0  BASOSABS 0.1   ------------------------------------------------------------------------------------------------------------------  Results for orders placed or performed during the hospital encounter of 12/23/18 (from the past 48 hour(s))  CBC with Differential     Status: Abnormal   Collection Time: 12/23/18  7:34 PM  Result Value Ref Range   WBC 16.3 (H) 4.0 - 10.5 K/uL   RBC 5.47 (H) 3.87 - 5.11 MIL/uL   Hemoglobin 16.0 (H) 12.0 - 15.0 g/dL   HCT 48.5 (H) 36.0 - 46.0 %   MCV 88.7 80.0 - 100.0 fL   MCH 29.3 26.0 - 34.0 pg   MCHC 33.0 30.0 - 36.0 g/dL   RDW 12.7 11.5 - 15.5 %   Platelets 210 150 - 400 K/uL   nRBC 0.0 0.0 - 0.2 %   Neutrophils Relative % 86 %   Neutro Abs 14.1 (H) 1.7 - 7.7 K/uL   Lymphocytes Relative 9 %   Lymphs Abs 1.4 0.7 - 4.0 K/uL   Monocytes Relative 4 %   Monocytes Absolute 0.6 0.1 - 1.0 K/uL   Eosinophils Relative 0 %   Eosinophils Absolute 0.0 0.0 - 0.5 K/uL   Basophils Relative 0 %   Basophils Absolute 0.1 0.0 - 0.1 K/uL   Immature Granulocytes 1 %   Abs Immature Granulocytes 0.11 (H) 0.00 - 0.07 K/uL    Comment: Performed at [facility] [address] [place] Comprehensive metabolic panel     Status: Abnormal   Collection Time: 12/23/18  7:58 PM  Result Value Ref Range   Sodium 135 135 - 145 mmol/L   Potassium 4.2 3.5 -  5.1 mmol/L   Chloride 102 98 - 111 mmol/L   CO2 22 22 - 32 mmol/L   Glucose, Bld 254 (H) 70 - 99 mg/dL   BUN 11 6 - 20 mg/dL   Creatinine, Ser 0.44 0.44 - 1.00 mg/dL   Calcium 10.4 (H) 8.9 - 10.3 mg/dL   Total Protein 6.9 6.5 - 8.1 g/dL   Albumin 4.0 3.5 - 5.0 g/dL   AST 11 (L) 15 - 41 U/L   ALT 18 0 - 44 U/L   Alkaline Phosphatase 66 38 - 126 U/L   Total Bilirubin 1.2 0.3 - 1.2 mg/dL   GFR calc non Af Amer >60 >60 mL/min   GFR calc Af Amer >60 >60 mL/min   Anion gap 11 5 -  15    Comment: Performed at [facility], [address] [place], [place] [address]  Lipase, blood     Status: None   Collection Time: 12/23/18  7:58 PM  Result Value Ref Range   Lipase 24 11 - 51 U/L    Comment: Performed at [facility], [address] [place], [place] [address]  Urinalysis, Routine w reflex microscopic     Status: Abnormal   Collection Time: 12/23/18  8:39 PM  Result Value Ref Range   Color, Urine YELLOW YELLOW   APPearance CLEAR CLEAR   Specific Gravity, Urine 1.015 1.005 - 1.030   pH 6.0 5.0 - 8.0   Glucose, UA >=500 (A) NEGATIVE mg/dL   Hgb urine dipstick TRACE (A) NEGATIVE   Bilirubin Urine NEGATIVE NEGATIVE   Ketones, ur >80 (A) NEGATIVE mg/dL   Protein, ur NEGATIVE NEGATIVE mg/dL   Nitrite NEGATIVE NEGATIVE   Leukocytes,Ua NEGATIVE NEGATIVE    Comment: Performed at [facility], [address] [place], [place] [address]  Urinalysis, Microscopic (reflex)     Status: Abnormal   Collection Time: 12/23/18  8:39 PM  Result Value Ref Range   RBC / HPF 0-5 0 - 5 RBC/hpf   WBC, UA 0-5 0 - 5 WBC/hpf   Bacteria, UA RARE (A) NONE SEEN   Squamous Epithelial / LPF 0-5 0 - 5   Mucus PRESENT     Comment: Performed at [facility], [address] [place], [place] [address]  SARS Coronavirus 2 (Hosp order,Performed in [facility] lab via [employer] ID)     Status: None   Collection Time: 12/23/18 11:10 PM   Specimen: Dry Nasal Swab ([employer] ID Now)  Result Value Ref Range   SARS Coronavirus 2 ([employer] ID Now) NEGATIVE NEGATIVE    Comment: (NOTE) Interpretive Result Comment(s): COVID 19 Positive SARS CoV 2 target nucleic acids are DETECTED. The SARS CoV 2 RNA is generally detectable in upper and lower respiratory specimens during the acute phase of infection.  Positive results are indicative of active infection with SARS CoV 2.  Clinical correlation with patient history and other diagnostic information is necessary to  determine patient infection status.  Positive results do not rule out bacterial infection or coinfection with other viruses. The expected result is Negative. COVID 19 Negative SARS CoV 2 target nucleic acids are NOT DETECTED. The SARS CoV 2 RNA is generally detectable in upper and lower respiratory specimens during the acute phase of infection.  Negative results do not preclude SARS CoV 2 infection, do not rule out coinfections with other pathogens, and should not be used as the sole basis for treatment or  other patient management decisions.  Negative results must be combined with clinical  observations, patient history, and epidemiological information. The expected result is Negative. Invalid Presence or absence of SARS CoV 2 nucleic acids cannot be determined. Repeat testing was performed on the submitted specimen and repeated Invalid results were obtained.  If clinically indicated, additional testing on a new specimen with an alternate test methodology [phone]) is advised.  The SARS CoV 2 RNA is generally detectable in upper and lower respiratory specimens during the acute phase of infection. The expected result is Negative. Fact Sheet for Patients:  GolfingFamily.no Fact Sheet for Healthcare Providers: https://www.hernandez-brewer.com/ This test is not yet approved or cleared by the [place] FDA and has been authorized for detection and/or diagnosis of SARS CoV 2 by FDA under an Emergency Use Authorization (EUA).  This EUA will remain in effect (meaning this test can be used) for the duration of the COVID19 d eclaration under Section 564(b)(1) of the Act, 21 U.S.C. section [phone] 3(b)(1), unless the authorization is terminated or revoked sooner. Performed at [facility], [address] [place], [place] [address]     Chemistries  Recent Labs  Lab 12/23/18 1958  NA 135  K 4.2  CL 102  CO2 22  GLUCOSE 254*  BUN 11   CREATININE 0.44  CALCIUM 10.4*  AST 11*  ALT 18  ALKPHOS 66  BILITOT 1.2   ------------------------------------------------------------------------------------------------------------------  ------------------------------------------------------------------------------------------------------------------ GFR: Estimated Creatinine Clearance: 90.8 mL/min (by C-G formula based on SCr of 0.44 mg/dL). Liver Function Tests: Recent Labs  Lab 12/23/18 1958  AST 11*  ALT 18  ALKPHOS 66  BILITOT 1.2  PROT 6.9  ALBUMIN 4.0   Recent Labs  Lab 12/23/18 1958  LIPASE 24   No results for input(s): AMMONIA in the last 168 hours. Coagulation Profile: No results for input(s): INR, PROTIME in the last 168 hours. Cardiac Enzymes: No results for input(s): CKTOTAL, CKMB, CKMBINDEX, TROPONINI in the last 168 hours. BNP (last 3 results) No results for input(s): PROBNP in the last 8760 hours. HbA1C: No results for input(s): HGBA1C in the last 72 hours. CBG: No results for input(s): GLUCAP in the last 168 hours. Lipid Profile: No results for input(s): CHOL, HDL, LDLCALC, TRIG, CHOLHDL, LDLDIRECT in the last 72 hours. Thyroid Function Tests: No results for input(s): TSH, T4TOTAL, FREET4, T3FREE, THYROIDAB in the last 72 hours. Anemia Panel: No results for input(s): VITAMINB12, FOLATE, FERRITIN, TIBC, IRON, RETICCTPCT in the last 72 hours.  --------------------------------------------------------------------------------------------------------------- Urine analysis:    Component Value Date/Time   COLORURINE YELLOW 12/23/2018 2039   APPEARANCEUR CLEAR 12/23/2018 2039   LABSPEC 1.015 12/23/2018 2039   PHURINE 6.0 12/23/2018 2039   GLUCOSEU >=500 (A) 12/23/2018 2039   HGBUR TRACE (A) 12/23/2018 2039   BILIRUBINUR NEGATIVE 12/23/2018 2039   BILIRUBINUR negative 11/19/2017 1429   KETONESUR >80 (A) 12/23/2018 2039   PROTEINUR NEGATIVE 12/23/2018 2039   UROBILINOGEN 0.2 11/19/2017 1429    NITRITE NEGATIVE 12/23/2018 2039   LEUKOCYTESUR NEGATIVE 12/23/2018 2039      Imaging Results:    Ct Abdomen Pelvis W Contrast  Result Date: 12/23/2018 CLINICAL DATA:  Abdominal distension. EXAM: CT ABDOMEN AND PELVIS WITH CONTRAST TECHNIQUE: Multidetector CT imaging of the abdomen and pelvis was performed using the standard protocol following bolus administration of intravenous contrast. CONTRAST:  175m OMNIPAQUE IOHEXOL 300 MG/ML  SOLN COMPARISON:  None. FINDINGS: Lower chest: No acute abnormality. Hepatobiliary: There is hepatic steatosis. The gallbladder is significantly distended.  There is some asymmetric wall thickening of the gallbladder. There is no intrahepatic or extrahepatic biliary ductal dilatation. Pancreas: Unremarkable. No pancreatic ductal dilatation or surrounding inflammatory changes. Spleen: Normal in size without focal abnormality. Adrenals/Urinary Tract: Adrenal glands are unremarkable. Kidneys are normal, without renal calculi, focal lesion, or hydronephrosis. The bladder is moderately distended. Stomach/Bowel: The stomach is moderately distended. There is scattered diverticula without CT evidence of diverticulitis. The appendix is not reliably identified. The patient appears to be status post appendectomy. Vascular/Lymphatic: No significant vascular findings are present. No enlarged abdominal or pelvic lymph nodes. Reproductive: The patient appears to be status post bilateral tubal ligation. One of the tubal ligation clips appears malpositioned and is no longer located in the expected region of the right adnexa. Other: No abdominal wall hernia or abnormality. No abdominopelvic ascites. Musculoskeletal: No acute or significant osseous findings. IMPRESSION: 1. Distended gallbladder with some asymmetric wall thickening. If there is clinical concern for acute cholecystitis, follow-up with ultrasound is recommended. 2. Moderately distended urinary bladder. 3. Distended stomach of unknown  clinical significance. 4. Probable malpositioning of the patient's right-sided tubal ligation clip. 5. Hepatic steatosis 6. Scattered colonic diverticula without CT evidence of diverticulitis. There is no evidence of a small-bowel obstruction. The patient appears to be status post prior appendectomy. Electronically Signed   By: Constance Holster M.D.   On: 12/23/2018 21:48   US Abdomen Limited  Result Date: 12/23/2018 CLINICAL DATA:  Nausea and vomiting. EXAM: ULTRASOUND ABDOMEN LIMITED RIGHT UPPER QUADRANT COMPARISON:  CT dated FINDINGS: Gallbladder: Cholelithiasis is noted. There is a single focal area of gallbladder wall thickening measuring up to approximately 3-4 mm. Multiple gallstones are noted near the gallbladder neck. Several these gallstones appear to be nonmobile. The sonographic Percell Miller sign cannot be adequately assessed secondary to patient condition. Common bile duct: Diameter: 0.4 cm Liver: Diffuse increased echogenicity with slightly heterogeneous liver. Appearance typically secondary to fatty infiltration. Fibrosis secondary consideration. No secondary findings of cirrhosis noted. No focal hepatic lesion or intrahepatic biliary duct dilatation. Portal vein is patent on color Doppler imaging with normal direction of blood flow towards the liver. IMPRESSION: 1. Cholelithiasis with a focal area of gallbladder wall thickening. Multiple gallstones are noted near the gallbladder neck. Many of the stones appear to be nonmobile. The sonographic Percell Miller sign cannot be adequately assessed secondary to patient condition. If there is high clinical suspicion for acute cholecystitis, follow-up with HIDA scan or surgical consultation is recommended. 2. The common bile duct is not dilated. 3. Hepatic steatosis. Electronically Signed   By: Constance Holster M.D.   On: 12/23/2018 22:54       Assessment & Plan:    Principal Problem:   Nausea and vomiting Active Problems:   Poorly controlled type 2  diabetes mellitus (HCC)   Hyperlipidemia associated with type 2 diabetes mellitus (HCC)   PCOS (polycystic ovarian syndrome)   Alternating constipation and diarrhea   Nausea & vomiting   Generalized abdominal pain  N/v NPO except for medications Ns iv STOP Ozempic STOP Metformin protonix 68m po qday Zofran 468miv q6h prn   Abdominal pain ? Secondary to Ozempic vs Metformin vs Irritable bowel vs Fatty Liver STOP Nsaids HIDA r/o cholecystitis Please consult surgery in AM if HIDA positive otherwise consider GI consultation  Gerd Discontinue Zantac as this has been recalled due to ? Cancer causing etiology Start Protonix 4071mo qday as above  Dm2 STOP Ozempic/ Metformin as above fsbs q4h, ISS  Hypertension Cont Losartan  84m po qday  Depression Cont Wellbutrin XL 3014mpo qday     DVT Prophylaxis-   Lovenox - SCDs   AM Labs Ordered, also please review Full Orders  Family Communication: Admission, patients condition and plan of care including tests being ordered have been discussed with the patient  who indicate understanding and agree with the plan and Code Status.  Code Status:  FULL CODE, [facility]: Observationt: Based on patients clinical presentation and evaluation of above clinical data, I have made determination that patient meets observation criteria at this time.   Time spent in minutes :55   JaJani Gravel.D on 12/24/2018 at 3:19 AM

## 2018-12-24 NOTE — H&P (Signed)
Lindsey Mason is an 48 y.o. female.   Chief Complaint: abd pain HPI: The patient is a 49 year old white female who presents with epigastric abd pain that started Tuesday. It has been associated with nausea and vomiting. She denies fever. CT shows thickening of gallbladder wall and wbc is 16,000. This is c/w cholecystitis. HIDA was positive as well.   Past Medical History:  Diagnosis Date  . Clomid pregnancy 1997, 2005  . Depression   . Diabetes mellitus without mention of complication   . Esophageal reflux   . Headache(784.0)   . Infertility, female    PCOS - Clomid pregnancies   . Ovarian cyst 2019  . PCOS (polycystic ovarian syndrome) 05/27/2012  . Pure hypercholesterolemia   . Rosacea   . Status post appendectomy 11/19/2017    Past Surgical History:  Procedure Laterality Date  . APPENDECTOMY    . CESAREAN SECTION     x 3, last one with triplets  . LAPAROSCOPIC BILATERAL SALPINGECTOMY Right 02/16/2018   Procedure: LAPAROSCOPIC RIGHT SALPINGECTOMY;  Surgeon: Megan Salon, MD;  Location: [facility];  Service: Gynecology;  Laterality: Right;  . TUBAL LIGATION      Family History  Problem Relation Age of Onset  . Cancer Mother        breast  . Heart disease Mother   . Stroke Mother   . Diabetes Mother   . Diabetes Father   . Breast cancer Maternal Aunt   . Colon cancer Neg Hx   . Inflammatory bowel disease Neg Hx   . Liver disease Neg Hx   . Pancreatic cancer Neg Hx   . Stomach cancer Neg Hx   . Esophageal cancer Neg Hx    Social History:  reports that she quit smoking about 14 years ago. Her smoking use included cigarettes. She has a 15.00 pack-year smoking history. She has never used smokeless tobacco. She reports current alcohol use. She reports that she does not use drugs.  Allergies:  Allergies  Allergen Reactions  . Lantus [Insulin Glargine] Other (See Comments)    headaches    Medications Prior to Admission  Medication Sig Dispense Refill   . ibuprofen (ADVIL) 200 MG tablet Take 400 mg by mouth every 6 (six) hours as needed for moderate pain.    . ranitidine (ZANTAC) 300 MG tablet 1/2 tab p.o. twice daily (Patient taking differently: Take 300 mg by mouth 2 (two) times daily as needed for heartburn. ) 90 tablet 3  . buPROPion (WELLBUTRIN XL) 300 MG 24 hr tablet Take 1 tablet (300 mg total) by mouth daily. Needs office visit for refills (Patient taking differently: Take 300 mg by mouth daily. ) 30 tablet 0  . chlorpheniramine-HYDROcodone (TUSSIONEX) 10-8 MG/5ML SUER Take 5 mLs by mouth every 12 (twelve) hours as needed for cough (cough, will cause drowsiness.). (Patient not taking: Reported on 12/24/2018) 200 mL 0  . doxycycline (VIBRA-TABS) 100 MG tablet Take 1 tablet (100 mg total) by mouth 2 (two) times daily. (Patient not taking: Reported on 12/24/2018) 20 tablet 0  . FLUoxetine (PROZAC) 10 MG tablet Take 1 tablet (10 mg total) by mouth daily for 30 days. Needs office visit for refills (Patient not taking: Reported on 12/24/2018) 30 tablet 0  . fluticasone (FLONASE) 50 MCG/ACT nasal spray 1 spray each nostril after sinus rinse twice daily (Patient taking differently: Place 1 spray into both nostrils daily. ) 16 g 2  . fluticasone (FLONASE) 50 MCG/ACT nasal spray  Place 2 sprays into both nostrils daily. (Patient not taking: Reported on 12/24/2018) 16 g 2  . ibuprofen (ADVIL,MOTRIN) 800 MG tablet Take 1 tablet (800 mg total) by mouth every 8 (eight) hours as needed. (Patient not taking: Reported on 12/24/2018) 30 tablet 0  . losartan (COZAAR) 50 MG tablet Take 0.5 tablets (25 mg total) by mouth daily. (Patient not taking: Reported on 12/24/2018) 45 tablet 3  . metFORMIN (GLUCOPHAGE-XR) 500 MG 24 hr tablet TAKE 4 TABLETS BY MOUTH ONCE DAILY WITH BREAKFAST (Patient taking differently: Take 2,000 mg by mouth daily with breakfast. ) 360 tablet 1  . Semaglutide, 1 MG/DOSE, (OZEMPIC, 1 MG/DOSE,) 2 MG/1.5ML SOPN Inject 1 mg into the skin once a week.  Needs office visit for refills (Patient taking differently: Inject 1 mg into the skin every Tuesday. ) 4 pen 0  . Vitamin D, Ergocalciferol, (DRISDOL) 50000 units CAPS capsule Please take 1 tablet Wednesday and 1 tablet Sunday every week (Patient not taking: Reported on 12/24/2018) 24 capsule 3    Results for orders placed or performed during the hospital encounter of 12/23/18 (from the past 48 hour(s))  CBC with Differential     Status: Abnormal   Collection Time: 12/23/18  7:34 PM  Result Value Ref Range   WBC 16.3 (H) 4.0 - 10.5 K/uL   RBC 5.47 (H) 3.87 - 5.11 MIL/uL   Hemoglobin 16.0 (H) 12.0 - 15.0 g/dL   HCT 48.5 (H) 36.0 - 46.0 %   MCV 88.7 80.0 - 100.0 fL   MCH 29.3 26.0 - 34.0 pg   MCHC 33.0 30.0 - 36.0 g/dL   RDW 12.7 11.5 - 15.5 %   Platelets 210 150 - 400 K/uL   nRBC 0.0 0.0 - 0.2 %   Neutrophils Relative % 86 %   Neutro Abs 14.1 (H) 1.7 - 7.7 K/uL   Lymphocytes Relative 9 %   Lymphs Abs 1.4 0.7 - 4.0 K/uL   Monocytes Relative 4 %   Monocytes Absolute 0.6 0.1 - 1.0 K/uL   Eosinophils Relative 0 %   Eosinophils Absolute 0.0 0.0 - 0.5 K/uL   Basophils Relative 0 %   Basophils Absolute 0.1 0.0 - 0.1 K/uL   Immature Granulocytes 1 %   Abs Immature Granulocytes 0.11 (H) 0.00 - 0.07 K/uL    Comment: Performed at [facility], [address] [place], [place] [address]  Comprehensive metabolic panel     Status: Abnormal   Collection Time: 12/23/18  7:58 PM  Result Value Ref Range   Sodium 135 135 - 145 mmol/L   Potassium 4.2 3.5 - 5.1 mmol/L   Chloride 102 98 - 111 mmol/L   CO2 22 22 - 32 mmol/L   Glucose, Bld 254 (H) 70 - 99 mg/dL   BUN 11 6 - 20 mg/dL   Creatinine, Ser 0.44 0.44 - 1.00 mg/dL   Calcium 10.4 (H) 8.9 - 10.3 mg/dL   Total Protein 6.9 6.5 - 8.1 g/dL   Albumin 4.0 3.5 - 5.0 g/dL   AST 11 (L) 15 - 41 U/L   ALT 18 0 - 44 U/L   Alkaline Phosphatase 66 38 - 126 U/L   Total Bilirubin 1.2 0.3 - 1.2 mg/dL   GFR calc non Af Amer >60 >60 mL/min    GFR calc Af Amer >60 >60 mL/min   Anion gap 11 5 - 15    Comment: Performed at [facility], [address] [place], [place] [address]  Lipase, blood     Status: None   Collection Time: 12/23/18  7:58 PM  Result Value Ref Range   Lipase 24 11 - 51 U/L    Comment: Performed at [facility], [address] [place], [place] [address]  Urinalysis, Routine w reflex microscopic     Status: Abnormal   Collection Time: 12/23/18  8:39 PM  Result Value Ref Range   Color, Urine YELLOW YELLOW   APPearance CLEAR CLEAR   Specific Gravity, Urine 1.015 1.005 - 1.030   pH 6.0 5.0 - 8.0   Glucose, UA >=500 (A) NEGATIVE mg/dL   Hgb urine dipstick TRACE (A) NEGATIVE   Bilirubin Urine NEGATIVE NEGATIVE   Ketones, ur >80 (A) NEGATIVE mg/dL   Protein, ur NEGATIVE NEGATIVE mg/dL   Nitrite NEGATIVE NEGATIVE   Leukocytes,Ua NEGATIVE NEGATIVE    Comment: Performed at [facility], [address] [place], [place] [address]  Urinalysis, Microscopic (reflex)     Status: Abnormal   Collection Time: 12/23/18  8:39 PM  Result Value Ref Range   RBC / HPF 0-5 0 - 5 RBC/hpf   WBC, UA 0-5 0 - 5 WBC/hpf   Bacteria, UA RARE (A) NONE SEEN   Squamous Epithelial / LPF 0-5 0 - 5   Mucus PRESENT     Comment: Performed at [facility], [address] [place], [place] [address]  SARS Coronavirus 2 (Hosp order,Performed in [place] lab via [employer] ID)     Status: None   Collection Time: 12/23/18 11:10 PM   Specimen: Dry Nasal Swab ([employer] ID Now)  Result Value Ref Range   SARS Coronavirus 2 ([employer] Now) NEGATIVE NEGATIVE    Comment: (NOTE) Interpretive Result Comment(s): COVID 19 Positive SARS CoV 2 target nucleic acids are DETECTED. The SARS CoV 2 RNA is generally detectable in upper and lower respiratory specimens during the acute phase of infection.  Positive results are indicative of active infection with SARS CoV 2.  Clinical correlation with patient history and  other diagnostic information is necessary to determine patient infection status.  Positive results do not rule out bacterial infection or coinfection with other viruses. The expected result is Negative. COVID 19 Negative SARS CoV 2 target nucleic acids are NOT DETECTED. The SARS CoV 2 RNA is generally detectable in upper and lower respiratory specimens during the acute phase of infection.  Negative results do not preclude SARS CoV 2 infection, do not rule out coinfections with other pathogens, and should not be used as the sole basis for treatment or other patient management decisions.  Negative results must be combined with clinical  observations, patient history, and epidemiological information. The expected result is Negative. Invalid Presence or absence of SARS CoV 2 nucleic acids cannot be determined. Repeat testing was performed on the submitted specimen and repeated Invalid results were obtained.  If clinically indicated, additional testing on a new specimen with an alternate test methodology [phone]) is advised.  The SARS CoV 2 RNA is generally detectable in upper and lower respiratory specimens during the acute phase of infection. The expected result is Negative. Fact Sheet for Patients:  GolfingFamily.no Fact Sheet for Healthcare Providers: https://www.hernandez-brewer.com/ This test is not yet approved or cleared by the [place] FDA and has been authorized for detection and/or diagnosis of SARS CoV 2 by FDA under an Emergency Use Authorization (EUA).  This EUA will remain in effect (meaning this  test can be used) for the duration of the COVID19 d eclaration under Section 564(b)(1) of the Act, 21 U.S.C. section [phone] 3(b)(1), unless the authorization is terminated or revoked sooner. Performed at [facility], [address] [place], [place] [address]   HIV antibody (Routine Testing)     Status: None   Collection  Time: 12/24/18  3:31 AM  Result Value Ref Range   HIV Screen 4th Generation wRfx Non Reactive Non Reactive    Comment: (NOTE) Performed At: [facility] [place], [place] [id number] Rush Farmer MD DJ:[id number]   Comprehensive metabolic panel     Status: Abnormal   Collection Time: 12/24/18  3:31 AM  Result Value Ref Range   Sodium 137 135 - 145 mmol/L   Potassium 3.8 3.5 - 5.1 mmol/L   Chloride 109 98 - 111 mmol/L   CO2 23 22 - 32 mmol/L   Glucose, Bld 249 (H) 70 - 99 mg/dL   BUN 9 6 - 20 mg/dL   Creatinine, Ser 0.41 (L) 0.44 - 1.00 mg/dL   Calcium 9.5 8.9 - 10.3 mg/dL   Total Protein 6.1 (L) 6.5 - 8.1 g/dL   Albumin 3.4 (L) 3.5 - 5.0 g/dL   AST 9 (L) 15 - 41 U/L   ALT 15 0 - 44 U/L   Alkaline Phosphatase 54 38 - 126 U/L   Total Bilirubin 1.0 0.3 - 1.2 mg/dL   GFR calc non Af Amer >60 >60 mL/min   GFR calc Af Amer >60 >60 mL/min   Anion gap 5 5 - 15    Comment: Performed at [facility], [place] [address], [place], [place] [address]  CBC     Status: Abnormal   Collection Time: 12/24/18  3:31 AM  Result Value Ref Range   WBC 12.8 (H) 4.0 - 10.5 K/uL   RBC 4.55 3.87 - 5.11 MIL/uL   Hemoglobin 13.2 12.0 - 15.0 g/dL   HCT 41.3 36.0 - 46.0 %   MCV 90.8 80.0 - 100.0 fL   MCH 29.0 26.0 - 34.0 pg   MCHC 32.0 30.0 - 36.0 g/dL   RDW 12.8 11.5 - 15.5 %   Platelets 235 150 - 400 K/uL   nRBC 0.0 0.0 - 0.2 %    Comment: Performed at [facility], [place] [address], [place], [place] [address]  Glucose, capillary     Status: Abnormal   Collection Time: 12/24/18  3:48 AM  Result Value Ref Range   Glucose-Capillary 238 (H) 70 - 99 mg/dL   Comment 1 Notify RN    Comment 2 Document in Chart   Glucose, capillary     Status: Abnormal   Collection Time: 12/24/18  7:12 AM  Result Value Ref Range   Glucose-Capillary 238 (H) 70 - 99 mg/dL  Glucose, capillary     Status: Abnormal   Collection Time: 12/24/18 12:54 PM  Result Value  Ref Range   Glucose-Capillary 219 (H) 70 - 99 mg/dL   Nm Hepatobiliary Liver Func  Result Date: 12/24/2018 CLINICAL DATA:  Abdominal pain, nausea and vomiting since 12/22/2018 EXAM: NUCLEAR MEDICINE HEPATOBILIARY IMAGING TECHNIQUE: Sequential images of the abdomen were obtained out to 60 minutes following intravenous administration of radiopharmaceutical. RADIOPHARMACEUTICALS:  5.15 mCi Tc-93m  Choletec IV COMPARISON:  Comparison CT and ultrasound examinations 12/23/2018 FINDINGS: Prompt and symmetric uptake noted in the liver and prompt excretion into the biliary tree which is visualized by 10 minutes. Activity  is seen in the small bowel by 20 minutes. The gallbladder is not visualized during the first 90 minutes. Patient was subsequently given 3 mg of morphine IV and additional 30 minutes of imaging was performed. The gallbladder was not visualized. Findings consistent with cystic duct obstruction. IMPRESSION: 1. Non visualization of the gallbladder consistent with cystic duct obstruction. 2. Patent common bile duct. Electronically Signed   By: Marijo Sanes M.D.   On: 12/24/2018 14:23   Ct Abdomen Pelvis W Contrast  Result Date: 12/23/2018 CLINICAL DATA:  Abdominal distension. EXAM: CT ABDOMEN AND PELVIS WITH CONTRAST TECHNIQUE: Multidetector CT imaging of the abdomen and pelvis was performed using the standard protocol following bolus administration of intravenous contrast. CONTRAST:  154mL OMNIPAQUE IOHEXOL 300 MG/ML  SOLN COMPARISON:  None. FINDINGS: Lower chest: No acute abnormality. Hepatobiliary: There is hepatic steatosis. The gallbladder is significantly distended. There is some asymmetric wall thickening of the gallbladder. There is no intrahepatic or extrahepatic biliary ductal dilatation. Pancreas: Unremarkable. No pancreatic ductal dilatation or surrounding inflammatory changes. Spleen: Normal in size without focal abnormality. Adrenals/Urinary Tract: Adrenal glands are unremarkable. Kidneys  are normal, without renal calculi, focal lesion, or hydronephrosis. The bladder is moderately distended. Stomach/Bowel: The stomach is moderately distended. There is scattered diverticula without CT evidence of diverticulitis. The appendix is not reliably identified. The patient appears to be status post appendectomy. Vascular/Lymphatic: No significant vascular findings are present. No enlarged abdominal or pelvic lymph nodes. Reproductive: The patient appears to be status post bilateral tubal ligation. One of the tubal ligation clips appears malpositioned and is no longer located in the expected region of the right adnexa. Other: No abdominal wall hernia or abnormality. No abdominopelvic ascites. Musculoskeletal: No acute or significant osseous findings. IMPRESSION: 1. Distended gallbladder with some asymmetric wall thickening. If there is clinical concern for acute cholecystitis, follow-up with ultrasound is recommended. 2. Moderately distended urinary bladder. 3. Distended stomach of unknown clinical significance. 4. Probable malpositioning of the patient's right-sided tubal ligation clip. 5. Hepatic steatosis 6. Scattered colonic diverticula without CT evidence of diverticulitis. There is no evidence of a small-bowel obstruction. The patient appears to be status post prior appendectomy. Electronically Signed   By: Constance Holster M.D.   On: 12/23/2018 21:48   US Abdomen Limited  Result Date: 12/23/2018 CLINICAL DATA:  Nausea and vomiting. EXAM: ULTRASOUND ABDOMEN LIMITED RIGHT UPPER QUADRANT COMPARISON:  CT dated FINDINGS: Gallbladder: Cholelithiasis is noted. There is a single focal area of gallbladder wall thickening measuring up to approximately 3-4 mm. Multiple gallstones are noted near the gallbladder neck. Several these gallstones appear to be nonmobile. The sonographic Percell Miller sign cannot be adequately assessed secondary to patient condition. Common bile duct: Diameter: 0.4 cm Liver: Diffuse  increased echogenicity with slightly heterogeneous liver. Appearance typically secondary to fatty infiltration. Fibrosis secondary consideration. No secondary findings of cirrhosis noted. No focal hepatic lesion or intrahepatic biliary duct dilatation. Portal vein is patent on color Doppler imaging with normal direction of blood flow towards the liver. IMPRESSION: 1. Cholelithiasis with a focal area of gallbladder wall thickening. Multiple gallstones are noted near the gallbladder neck. Many of the stones appear to be nonmobile. The sonographic Percell Miller sign cannot be adequately assessed secondary to patient condition. If there is high clinical suspicion for acute cholecystitis, follow-up with HIDA scan or surgical consultation is recommended. 2. The common bile duct is not dilated. 3. Hepatic steatosis. Electronically Signed   By: Constance Holster M.D.   On: 12/23/2018 22:54  Review of Systems  Constitutional: Negative.   HENT: Negative.   Eyes: Negative.   Respiratory: Negative.   Cardiovascular: Negative.   Gastrointestinal: Positive for abdominal pain, nausea and vomiting.  Genitourinary: Negative.   Musculoskeletal: Negative.   Skin: Negative.   Neurological: Negative.   Endo/Heme/Allergies: Negative.   Psychiatric/Behavioral: Negative.     Blood pressure 127/79, pulse 94, temperature 98.7 F (37.1 C), temperature source Oral, resp. rate 16, height 5\' 6"  (1.676 m), weight 78.2 kg, SpO2 99 %. Physical Exam  Constitutional: She is oriented to person, place, and time. She appears well-developed and well-nourished. No distress.  HENT:  Head: Normocephalic and atraumatic.  Mouth/Throat: No oropharyngeal exudate.  Eyes: Pupils are equal, round, and reactive to light. Conjunctivae and EOM are normal.  Neck: Normal range of motion. Neck supple.  Cardiovascular: Normal rate, regular rhythm and normal heart sounds.  Respiratory: Effort normal and breath sounds normal. No stridor. No  respiratory distress.  GI: Soft. She exhibits no mass. There is abdominal tenderness.  Musculoskeletal: Normal range of motion.        General: No tenderness or edema.  Neurological: She is alert and oriented to person, place, and time. Coordination normal.  Skin: Skin is warm and dry. No rash noted.  Psychiatric: She has a normal mood and affect. Her behavior is normal. Thought content normal.     Assessment/Plan The patient appears to have cholecystitis. Because of the risk of further painful episodes I think she would benefit from having gallbladder removed. I have discussed with her in detail the risks and benefits of the surgery as well as some of the technical aspects and she understands and wishes to proceed. I will plan for lap chole with ioc tomorrow  Autumn Messing III, MD 12/24/2018, 3:33 PM

## 2018-12-24 NOTE — ED Notes (Signed)
Spoke with [facility]- david

## 2018-12-24 NOTE — ED Notes (Signed)
[facility] arrived to transport pt to [facility] 

## 2018-12-24 NOTE — Progress Notes (Signed)
PROGRESS NOTE  Lindsey Mason UUE:[id number] DOB: 1971/03/16 DOA: 12/23/2018 PCP: Lindsey Dance, DO  HPI/Recap of past 24 hours: HPI from Dr Leda Quail  is a 48 y.o. female, w Depression, Hyperlipidemia, Dm2, Gerd, Pcos apparently c/o n/v, abdominal pain X 2 days, associated with low grade temp but not exceeding 100 per pt.  Pt denies cp, palp, sob, brbpr, black stool, dysuria, hematuria.  Pt notes that she restarted her Ozempic which has caused nausea and vomitting in the past.  In the ED, patient had a white count of 16.3, CT abdomen pelvis showed distended gallbladder with some asymmetric wall thickening, possible acute cholecystitis, follow-up with ultrasound recommended.  Patient admitted for further management    Today, patient still complained of nausea, but denies any vomiting, still with epigastric pain.  Patient denies any fever/chills, chest pain, shortness of breath, diarrhea.  Assessment/Plan: Principal Problem:   Nausea and vomiting Active Problems:   Poorly controlled type 2 diabetes mellitus (HCC)   Hyperlipidemia associated with type 2 diabetes mellitus (HCC)   PCOS (polycystic ovarian syndrome)   Alternating constipation and diarrhea   Nausea & vomiting   Generalized abdominal pain  Epigastric pain/N/V Likely 2/2 meds (Ozempic, metformin) Vs rule out acute cholecystitis Currently afebrile, with leukocytosis LFTs, lipase all within normal CT abdomen pelvis showed distended gallbladder with some asymmetric wall thickening, possible acute cholecystitis, follow-up with ultrasound recommended Abdominal ultrasound showed cholelithiasis with a focal area of gallbladder wall thickening.  Multiple gallstones noted, recommend HIDA scan HIDA scan showed cystic duct obstruction--General surgery consulted IVF Hold Ozempic/metformin Monitor closely  Leukocytosis Likely reactive/hemoconcentration, currently afebrile CBC  GERD Discontinue Zantac as this has been  recalled due to ? Cancer causing etiology Start Protonix  Diabetes mellitus type 2 Last A1c 5.9 on 12/2017 Hold Ozempic/ Metformin as above SSI, hypoglycemic protocol  Hypertension BP stable  Depression Cont Wellbutrin, Prozac         Malnutrition Type:      Malnutrition Characteristics:      Nutrition Interventions:       Estimated body mass index is 27.83 kg/m as calculated from the following:   Height as of this encounter: 5\' 6"  (1.676 m).   Weight as of this encounter: 78.2 kg.     Code Status: Full  Family Communication: None at bedside  Disposition Plan: To be determined   Consultants:  General surgery  Procedures:  None  Antimicrobials:  None  DVT prophylaxis: Lovenox   Objective: Vitals:   12/24/18 0000 12/24/18 0015 12/24/18 0130 12/24/18 0503  BP: 122/71  139/77 126/80  Pulse: (!) 113 (!) 113 (!) 109 (!) 104  Resp:   18 18  Temp:   99.6 F (37.6 C) 99.1 F (37.3 C)  TempSrc:   Oral Oral  SpO2: 93% 94% 99% 98%  Weight:   78.2 kg   Height:   5\' 6"  (1.676 m)     Intake/Output Summary (Last 24 hours) at 12/24/2018 1152 Last data filed at 12/24/2018 1000 Gross per 24 hour  Intake 3279.74 ml  Output 0 ml  Net 3279.74 ml   Filed Weights   12/23/18 1923 12/24/18 0130  Weight: 77.1 kg 78.2 kg    Exam:  General: NAD   Cardiovascular: S1, S2 present  Respiratory: CTAB  Abdomen: Soft, ++tender @ epigastric region, nondistended, bowel sounds present  Musculoskeletal: No bilateral pedal edema noted  Skin: Normal  Psychiatry: Normal mood   Data Reviewed: CBC: Recent Labs  Lab  12/23/18 1934 12/24/18 0331  WBC 16.3* 12.8*  NEUTROABS 14.1*  --   HGB 16.0* 13.2  HCT 48.5* 41.3  MCV 88.7 90.8  PLT 210 893   Basic Metabolic Panel: Recent Labs  Lab 12/23/18 1958 12/24/18 0331  NA 135 137  K 4.2 3.8  CL 102 109  CO2 22 23  GLUCOSE 254* 249*  BUN 11 9  CREATININE 0.44 0.41*  CALCIUM 10.4* 9.5    GFR: Estimated Creatinine Clearance: 90.8 mL/min (A) (by C-G formula based on SCr of 0.41 mg/dL (L)). Liver Function Tests: Recent Labs  Lab 12/23/18 1958 12/24/18 0331  AST 11* 9*  ALT 18 15  ALKPHOS 66 54  BILITOT 1.2 1.0  PROT 6.9 6.1*  ALBUMIN 4.0 3.4*   Recent Labs  Lab 12/23/18 1958  LIPASE 24   No results for input(s): AMMONIA in the last 168 hours. Coagulation Profile: No results for input(s): INR, PROTIME in the last 168 hours. Cardiac Enzymes: No results for input(s): CKTOTAL, CKMB, CKMBINDEX, TROPONINI in the last 168 hours. BNP (last 3 results) No results for input(s): PROBNP in the last 8760 hours. HbA1C: No results for input(s): HGBA1C in the last 72 hours. CBG: Recent Labs  Lab 12/24/18 0348 12/24/18 0712  GLUCAP 238* 238*   Lipid Profile: No results for input(s): CHOL, HDL, LDLCALC, TRIG, CHOLHDL, LDLDIRECT in the last 72 hours. Thyroid Function Tests: No results for input(s): TSH, T4TOTAL, FREET4, T3FREE, THYROIDAB in the last 72 hours. Anemia Panel: No results for input(s): VITAMINB12, FOLATE, FERRITIN, TIBC, IRON, RETICCTPCT in the last 72 hours. Urine analysis:    Component Value Date/Time   COLORURINE YELLOW 12/23/2018 2039   APPEARANCEUR CLEAR 12/23/2018 2039   LABSPEC 1.015 12/23/2018 2039   PHURINE 6.0 12/23/2018 2039   GLUCOSEU >=500 (A) 12/23/2018 2039   HGBUR TRACE (A) 12/23/2018 2039   BILIRUBINUR NEGATIVE 12/23/2018 2039   BILIRUBINUR negative 11/19/2017 1429   KETONESUR >80 (A) 12/23/2018 2039   PROTEINUR NEGATIVE 12/23/2018 2039   UROBILINOGEN 0.2 11/19/2017 1429   NITRITE NEGATIVE 12/23/2018 2039   LEUKOCYTESUR NEGATIVE 12/23/2018 2039   Sepsis Labs: @LABRCNTIP (procalcitonin:4,lacticidven:4)  ) Recent Results (from the past 240 hour(s))  SARS Coronavirus 2 (Hosp order,Performed in [place] lab via [employer] ID)     Status: None   Collection Time: 12/23/18 11:10 PM   Specimen: Dry Nasal Swab ([facility] ID Now)  Result  Value Ref Range Status   SARS Coronavirus 2 ([facility] ID Now) NEGATIVE NEGATIVE Final    Comment: (NOTE) Interpretive Result Comment(s): COVID 19 Positive SARS CoV 2 target nucleic acids are DETECTED. The SARS CoV 2 RNA is generally detectable in upper and lower respiratory specimens during the acute phase of infection.  Positive results are indicative of active infection with SARS CoV 2.  Clinical correlation with patient history and other diagnostic information is necessary to determine patient infection status.  Positive results do not rule out bacterial infection or coinfection with other viruses. The expected result is Negative. COVID 19 Negative SARS CoV 2 target nucleic acids are NOT DETECTED. The SARS CoV 2 RNA is generally detectable in upper and lower respiratory specimens during the acute phase of infection.  Negative results do not preclude SARS CoV 2 infection, do not rule out coinfections with other pathogens, and should not be used as the sole basis for treatment or other patient management decisions.  Negative results must be combined with clinical  observations, patient history, and epidemiological information. The expected result is  Negative. Invalid Presence or absence of SARS CoV 2 nucleic acids cannot be determined. Repeat testing was performed on the submitted specimen and repeated Invalid results were obtained.  If clinically indicated, additional testing on a new specimen with an alternate test methodology [id number]) is advised.  The SARS CoV 2 RNA is generally detectable in upper and lower respiratory specimens during the acute phase of infection. The expected result is Negative. Fact Sheet for Patients:  GolfingFamily.no Fact Sheet for Healthcare Providers: https://www.hernandez-brewer.com/ This test is not yet approved or cleared by the [place] FDA and has been authorized for detection and/or diagnosis of SARS CoV 2  by FDA under an Emergency Use Authorization (EUA).  This EUA will remain in effect (meaning this test can be used) for the duration of the COVID19 d eclaration under Section 564(b)(1) of the Act, 21 U.S.C. section [phone] 3(b)(1), unless the authorization is terminated or revoked sooner. Performed at [facility], [address] [place], [place] [address]       Studies: Ct Abdomen Pelvis W Contrast  Result Date: 12/23/2018 CLINICAL DATA:  Abdominal distension. EXAM: CT ABDOMEN AND PELVIS WITH CONTRAST TECHNIQUE: Multidetector CT imaging of the abdomen and pelvis was performed using the standard protocol following bolus administration of intravenous contrast. CONTRAST:  154mL OMNIPAQUE IOHEXOL 300 MG/ML  SOLN COMPARISON:  None. FINDINGS: Lower chest: No acute abnormality. Hepatobiliary: There is hepatic steatosis. The gallbladder is significantly distended. There is some asymmetric wall thickening of the gallbladder. There is no intrahepatic or extrahepatic biliary ductal dilatation. Pancreas: Unremarkable. No pancreatic ductal dilatation or surrounding inflammatory changes. Spleen: Normal in size without focal abnormality. Adrenals/Urinary Tract: Adrenal glands are unremarkable. Kidneys are normal, without renal calculi, focal lesion, or hydronephrosis. The bladder is moderately distended. Stomach/Bowel: The stomach is moderately distended. There is scattered diverticula without CT evidence of diverticulitis. The appendix is not reliably identified. The patient appears to be status post appendectomy. Vascular/Lymphatic: No significant vascular findings are present. No enlarged abdominal or pelvic lymph nodes. Reproductive: The patient appears to be status post bilateral tubal ligation. One of the tubal ligation clips appears malpositioned and is no longer located in the expected region of the right adnexa. Other: No abdominal wall hernia or abnormality. No abdominopelvic ascites.  Musculoskeletal: No acute or significant osseous findings. IMPRESSION: 1. Distended gallbladder with some asymmetric wall thickening. If there is clinical concern for acute cholecystitis, follow-up with ultrasound is recommended. 2. Moderately distended urinary bladder. 3. Distended stomach of unknown clinical significance. 4. Probable malpositioning of the patient's right-sided tubal ligation clip. 5. Hepatic steatosis 6. Scattered colonic diverticula without CT evidence of diverticulitis. There is no evidence of a small-bowel obstruction. The patient appears to be status post prior appendectomy. Electronically Signed   By: Constance Holster M.D.   On: 12/23/2018 21:48   US Abdomen Limited  Result Date: 12/23/2018 CLINICAL DATA:  Nausea and vomiting. EXAM: ULTRASOUND ABDOMEN LIMITED RIGHT UPPER QUADRANT COMPARISON:  CT dated FINDINGS: Gallbladder: Cholelithiasis is noted. There is a single focal area of gallbladder wall thickening measuring up to approximately 3-4 mm. Multiple gallstones are noted near the gallbladder neck. Several these gallstones appear to be nonmobile. The sonographic Percell Miller sign cannot be adequately assessed secondary to patient condition. Common bile duct: Diameter: 0.4 cm Liver: Diffuse increased echogenicity with slightly heterogeneous liver. Appearance typically secondary to fatty infiltration. Fibrosis secondary consideration. No secondary findings of cirrhosis noted. No focal hepatic lesion or intrahepatic biliary duct dilatation. Portal vein is  patent on color Doppler imaging with normal direction of blood flow towards the liver. IMPRESSION: 1. Cholelithiasis with a focal area of gallbladder wall thickening. Multiple gallstones are noted near the gallbladder neck. Many of the stones appear to be nonmobile. The sonographic Percell Miller sign cannot be adequately assessed secondary to patient condition. If there is high clinical suspicion for acute cholecystitis, follow-up with HIDA scan or  surgical consultation is recommended. 2. The common bile duct is not dilated. 3. Hepatic steatosis. Electronically Signed   By: Constance Holster M.D.   On: 12/23/2018 22:54    Scheduled Meds:  buPROPion  300 mg Oral Daily   enoxaparin (LOVENOX) injection  40 mg Subcutaneous Daily   fluticasone  1 spray Each Nare Daily   insulin aspart  0-9 Units Subcutaneous Q4H   pantoprazole  40 mg Oral Daily    Continuous Infusions:   LOS: 0 days     Alma Friendly, MD [facility]  If 7PM-7AM, please contact night-coverage [email or web address] 12/24/2018, 11:52 AM

## 2018-12-25 ENCOUNTER — Observation Stay (HOSPITAL_COMMUNITY): Payer: BC Managed Care – PPO

## 2018-12-25 ENCOUNTER — Observation Stay (HOSPITAL_COMMUNITY): Payer: BC Managed Care – PPO | Admitting: Anesthesiology

## 2018-12-25 ENCOUNTER — Encounter (HOSPITAL_COMMUNITY)
Admission: EM | Disposition: A | Discharge status: Home / Self Care | Payer: Self-pay | Source: Home / Self Care | Attending: Internal Medicine

## 2018-12-25 ENCOUNTER — Encounter (HOSPITAL_COMMUNITY): Payer: Self-pay | Admitting: Anesthesiology

## 2018-12-25 DIAGNOSIS — Z7951 Long term (current) use of inhaled steroids: Secondary | ICD-10-CM | POA: Diagnosis not present

## 2018-12-25 DIAGNOSIS — R52 Pain, unspecified: Secondary | ICD-10-CM | POA: Diagnosis present

## 2018-12-25 DIAGNOSIS — Z7984 Long term (current) use of oral hypoglycemic drugs: Secondary | ICD-10-CM | POA: Diagnosis not present

## 2018-12-25 DIAGNOSIS — K219 Gastro-esophageal reflux disease without esophagitis: Secondary | ICD-10-CM | POA: Diagnosis present

## 2018-12-25 DIAGNOSIS — I10 Essential (primary) hypertension: Secondary | ICD-10-CM | POA: Diagnosis present

## 2018-12-25 DIAGNOSIS — K760 Fatty (change of) liver, not elsewhere classified: Secondary | ICD-10-CM | POA: Diagnosis present

## 2018-12-25 DIAGNOSIS — F329 Major depressive disorder, single episode, unspecified: Secondary | ICD-10-CM | POA: Diagnosis present

## 2018-12-25 DIAGNOSIS — K8001 Calculus of gallbladder with acute cholecystitis with obstruction: Secondary | ICD-10-CM | POA: Diagnosis present

## 2018-12-25 DIAGNOSIS — K810 Acute cholecystitis: Secondary | ICD-10-CM

## 2018-12-25 DIAGNOSIS — Z79899 Other long term (current) drug therapy: Secondary | ICD-10-CM | POA: Diagnosis not present

## 2018-12-25 DIAGNOSIS — E282 Polycystic ovarian syndrome: Secondary | ICD-10-CM

## 2018-12-25 DIAGNOSIS — Z87891 Personal history of nicotine dependence: Secondary | ICD-10-CM | POA: Diagnosis not present

## 2018-12-25 DIAGNOSIS — Z8249 Family history of ischemic heart disease and other diseases of the circulatory system: Secondary | ICD-10-CM | POA: Diagnosis not present

## 2018-12-25 DIAGNOSIS — E782 Mixed hyperlipidemia: Secondary | ICD-10-CM | POA: Diagnosis present

## 2018-12-25 DIAGNOSIS — E1169 Type 2 diabetes mellitus with other specified complication: Secondary | ICD-10-CM | POA: Diagnosis present

## 2018-12-25 DIAGNOSIS — Z888 Allergy status to other drugs, medicaments and biological substances status: Secondary | ICD-10-CM | POA: Diagnosis not present

## 2018-12-25 DIAGNOSIS — Z20828 Contact with and (suspected) exposure to other viral communicable diseases: Secondary | ICD-10-CM | POA: Diagnosis present

## 2018-12-25 DIAGNOSIS — Z833 Family history of diabetes mellitus: Secondary | ICD-10-CM | POA: Diagnosis not present

## 2018-12-25 DIAGNOSIS — Z9079 Acquired absence of other genital organ(s): Secondary | ICD-10-CM | POA: Diagnosis not present

## 2018-12-25 HISTORY — PX: CHOLECYSTECTOMY: SHX55

## 2018-12-25 LAB — HEMOGLOBIN A1C
Hgb A1c MFr Bld: 11.3 % — ABNORMAL HIGH (ref 4.8–5.6)
Mean Plasma Glucose: 277.61 mg/dL

## 2018-12-25 LAB — BASIC METABOLIC PANEL
Anion gap: 7 (ref 5–15)
BUN: 9 mg/dL (ref 6–20)
CO2: 23 mmol/L (ref 22–32)
Calcium: 8 mg/dL — ABNORMAL LOW (ref 8.9–10.3)
Chloride: 109 mmol/L (ref 98–111)
Creatinine, Ser: 0.43 mg/dL — ABNORMAL LOW (ref 0.44–1.00)
GFR calc Af Amer: >60 mL/min (ref >60)
GFR calc non Af Amer: >60 mL/min (ref >60)
Glucose, Bld: 125 mg/dL — ABNORMAL HIGH (ref 70–99)
Potassium: 3.2 mmol/L — ABNORMAL LOW (ref 3.5–5.1)
Sodium: 139 mmol/L (ref 135–145)

## 2018-12-25 LAB — SURGICAL PCR SCREEN
MRSA, PCR: NEGATIVE
Staphylococcus aureus: POSITIVE — AB

## 2018-12-25 LAB — GLUCOSE, CAPILLARY
Glucose-Capillary: 126 mg/dL — ABNORMAL HIGH (ref 70–99)
Glucose-Capillary: 127 mg/dL — ABNORMAL HIGH (ref 70–99)
Glucose-Capillary: 130 mg/dL — ABNORMAL HIGH (ref 70–99)
Glucose-Capillary: 196 mg/dL — ABNORMAL HIGH (ref 70–99)
Glucose-Capillary: 204 mg/dL — ABNORMAL HIGH (ref 70–99)
Glucose-Capillary: 219 mg/dL — ABNORMAL HIGH (ref 70–99)

## 2018-12-25 LAB — CBC WITH DIFFERENTIAL/PLATELET
Abs Immature Granulocytes: 0.07 10*3/uL (ref 0.00–0.07)
Basophils Absolute: 0 10*3/uL (ref 0.0–0.1)
Basophils Relative: 1 %
Eosinophils Absolute: 0.2 10*3/uL (ref 0.0–0.5)
Eosinophils Relative: 2 %
HCT: 37.4 % (ref 36.0–46.0)
Hemoglobin: 12.1 g/dL (ref 12.0–15.0)
Immature Granulocytes: 1 %
Lymphocytes Relative: 36 %
Lymphs Abs: 2.7 10*3/uL (ref 0.7–4.0)
MCH: 29.6 pg (ref 26.0–34.0)
MCHC: 32.4 g/dL (ref 30.0–36.0)
MCV: 91.4 fL (ref 80.0–100.0)
Monocytes Absolute: 0.5 10*3/uL (ref 0.1–1.0)
Monocytes Relative: 6 %
Neutro Abs: 4.1 10*3/uL (ref 1.7–7.7)
Neutrophils Relative %: 54 %
Platelets: 196 10*3/uL (ref 150–400)
RBC: 4.09 MIL/uL (ref 3.87–5.11)
RDW: 12.7 % (ref 11.5–15.5)
WBC: 7.6 10*3/uL (ref 4.0–10.5)
nRBC: 0 % (ref 0.0–0.2)

## 2018-12-25 SURGERY — LAPAROSCOPIC CHOLECYSTECTOMY WITH INTRAOPERATIVE CHOLANGIOGRAM
Anesthesia: General | Site: Abdomen

## 2018-12-25 MED ORDER — ONDANSETRON HCL 4 MG/2ML IJ SOLN
4.0000 mg | Freq: Once | INTRAMUSCULAR | Status: AC | PRN
  Administered 2018-12-25: 13:00:00 4 mg via INTRAVENOUS

## 2018-12-25 MED ORDER — HYDROMORPHONE HCL 1 MG/ML IJ SOLN
INTRAMUSCULAR | Status: AC
  Filled 2018-12-25: qty 1

## 2018-12-25 MED ORDER — FENTANYL CITRATE (PF) 100 MCG/2ML IJ SOLN
INTRAMUSCULAR | Status: DC | PRN
  Administered 2018-12-25 (×2): 50 ug via INTRAVENOUS
  Administered 2018-12-25: 100 ug via INTRAVENOUS

## 2018-12-25 MED ORDER — SUGAMMADEX SODIUM 200 MG/2ML IV SOLN
INTRAVENOUS | Status: AC
  Filled 2018-12-25: qty 2

## 2018-12-25 MED ORDER — MORPHINE SULFATE (PF) 4 MG/ML IV SOLN
1.0000 mg | INTRAVENOUS | Status: DC | PRN
  Administered 2018-12-25 (×2): 2 mg via INTRAVENOUS
  Filled 2018-12-25 (×2): qty 1

## 2018-12-25 MED ORDER — MIDAZOLAM HCL 5 MG/5ML IJ SOLN
INTRAMUSCULAR | Status: DC | PRN
  Administered 2018-12-25: 2 mg via INTRAVENOUS

## 2018-12-25 MED ORDER — BUPIVACAINE-EPINEPHRINE (PF) 0.25% -1:200000 IJ SOLN
INTRAMUSCULAR | Status: AC
  Filled 2018-12-25: qty 30

## 2018-12-25 MED ORDER — ROCURONIUM BROMIDE 10 MG/ML (PF) SYRINGE
PREFILLED_SYRINGE | INTRAVENOUS | Status: AC
  Filled 2018-12-25: qty 10

## 2018-12-25 MED ORDER — IOHEXOL 300 MG/ML  SOLN
INTRAMUSCULAR | Status: DC | PRN
  Administered 2018-12-25: 2.5 mL

## 2018-12-25 MED ORDER — ONDANSETRON HCL 4 MG/2ML IJ SOLN
INTRAMUSCULAR | Status: DC | PRN
  Administered 2018-12-25: 4 mg via INTRAVENOUS

## 2018-12-25 MED ORDER — FENTANYL CITRATE (PF) 100 MCG/2ML IJ SOLN
INTRAMUSCULAR | Status: AC
  Filled 2018-12-25: qty 2

## 2018-12-25 MED ORDER — DEXAMETHASONE SODIUM PHOSPHATE 10 MG/ML IJ SOLN
INTRAMUSCULAR | Status: DC | PRN
  Administered 2018-12-25: 10 mg via INTRAVENOUS

## 2018-12-25 MED ORDER — SODIUM CHLORIDE 0.9 % IV SOLN
INTRAVENOUS | Status: DC
  Administered 2018-12-25 – 2018-12-26 (×3): via INTRAVENOUS

## 2018-12-25 MED ORDER — LACTATED RINGERS IV SOLN
INTRAVENOUS | Status: DC | PRN
  Administered 2018-12-25: 10:00:00 via INTRAVENOUS
  Administered 2018-12-25: 1000 mL via INTRAVENOUS

## 2018-12-25 MED ORDER — LACTATED RINGERS IR SOLN
Status: DC | PRN
  Administered 2018-12-25: 1000 mL

## 2018-12-25 MED ORDER — SUGAMMADEX SODIUM 200 MG/2ML IV SOLN
INTRAVENOUS | Status: DC | PRN
  Administered 2018-12-25: 200 mg via INTRAVENOUS

## 2018-12-25 MED ORDER — 0.9 % SODIUM CHLORIDE (POUR BTL) OPTIME
TOPICAL | Status: DC | PRN
  Administered 2018-12-25: 1000 mL

## 2018-12-25 MED ORDER — HYDROMORPHONE HCL 1 MG/ML IJ SOLN
0.2500 mg | INTRAMUSCULAR | Status: DC | PRN
  Administered 2018-12-25 (×4): 0.5 mg via INTRAVENOUS

## 2018-12-25 MED ORDER — ONDANSETRON HCL 4 MG/2ML IJ SOLN
INTRAMUSCULAR | Status: AC
  Filled 2018-12-25: qty 2

## 2018-12-25 MED ORDER — EPHEDRINE SULFATE-NACL 50-0.9 MG/10ML-% IV SOSY
PREFILLED_SYRINGE | INTRAVENOUS | Status: DC | PRN
  Administered 2018-12-25: 5 mg via INTRAVENOUS

## 2018-12-25 MED ORDER — HYDROCODONE-ACETAMINOPHEN 5-325 MG PO TABS
1.0000 | ORAL_TABLET | ORAL | Status: DC | PRN
  Administered 2018-12-25 – 2018-12-26 (×3): 1 via ORAL
  Filled 2018-12-25: qty 2
  Filled 2018-12-25 (×3): qty 1

## 2018-12-25 MED ORDER — PROPOFOL 10 MG/ML IV BOLUS
INTRAVENOUS | Status: AC
  Filled 2018-12-25: qty 40

## 2018-12-25 MED ORDER — MIDAZOLAM HCL 2 MG/2ML IJ SOLN
INTRAMUSCULAR | Status: AC
  Filled 2018-12-25: qty 2

## 2018-12-25 MED ORDER — PROPOFOL 10 MG/ML IV BOLUS
INTRAVENOUS | Status: DC | PRN
  Administered 2018-12-25: 110 mg via INTRAVENOUS

## 2018-12-25 MED ORDER — MEPERIDINE HCL 50 MG/ML IJ SOLN: 6.2500 mg | INTRAMUSCULAR | Status: DC | PRN

## 2018-12-25 MED ORDER — ROCURONIUM BROMIDE 10 MG/ML (PF) SYRINGE
PREFILLED_SYRINGE | INTRAVENOUS | Status: DC | PRN
  Administered 2018-12-25: 60 mg via INTRAVENOUS
  Administered 2018-12-25: 5 mg via INTRAVENOUS

## 2018-12-25 MED ORDER — LIDOCAINE 2% (20 MG/ML) 5 ML SYRINGE
INTRAMUSCULAR | Status: AC
  Filled 2018-12-25: qty 5

## 2018-12-25 MED ORDER — BUPIVACAINE-EPINEPHRINE 0.25% -1:200000 IJ SOLN
INTRAMUSCULAR | Status: DC | PRN
  Administered 2018-12-25: 30 mL

## 2018-12-25 MED ORDER — LIDOCAINE 2% (20 MG/ML) 5 ML SYRINGE
INTRAMUSCULAR | Status: DC | PRN
  Administered 2018-12-25: 100 mg via INTRAVENOUS

## 2018-12-25 MED ORDER — MUPIROCIN 2 % EX OINT
1.0000 "application " | TOPICAL_OINTMENT | Freq: Two times a day (BID) | CUTANEOUS | Status: DC
  Administered 2018-12-25 – 2018-12-26 (×3): 1 via NASAL
  Filled 2018-12-25: qty 22

## 2018-12-25 MED ORDER — CHLORHEXIDINE GLUCONATE CLOTH 2 % EX PADS: 6.0000 | MEDICATED_PAD | Freq: Every day | CUTANEOUS | Status: DC

## 2018-12-25 MED ORDER — INSULIN ASPART 100 UNIT/ML ~~LOC~~ SOLN
SUBCUTANEOUS | Status: AC
  Filled 2018-12-25: qty 1

## 2018-12-25 MED ORDER — ENOXAPARIN SODIUM 40 MG/0.4ML ~~LOC~~ SOLN
40.0000 mg | Freq: Every day | SUBCUTANEOUS | Status: DC
  Administered 2018-12-26: 40 mg via SUBCUTANEOUS
  Filled 2018-12-25: qty 0.4

## 2018-12-25 MED ORDER — EPHEDRINE 5 MG/ML INJ
INTRAVENOUS | Status: AC
  Filled 2018-12-25: qty 10

## 2018-12-25 SURGICAL SUPPLY — 30 items
APPLIER CLIP 5 13 M/L LIGAMAX5 (MISCELLANEOUS) ×2
APR CLP MED LRG 5 ANG JAW (MISCELLANEOUS) ×1
CABLE HIGH FREQUENCY MONO STRZ (ELECTRODE) ×2 IMPLANT
CATH REDDICK CHOLANGI 4FR 50CM (CATHETERS) ×2 IMPLANT
CHLORAPREP W/TINT 26 (MISCELLANEOUS) ×2 IMPLANT
CLIP APPLIE 5 13 M/L LIGAMAX5 (MISCELLANEOUS) ×1 IMPLANT
COVER MAYO STAND STRL (DRAPES) ×2 IMPLANT
COVER WAND RF STERILE (DRAPES) IMPLANT
DECANTER SPIKE VIAL GLASS SM (MISCELLANEOUS) ×2 IMPLANT
DERMABOND ADVANCED (GAUZE/BANDAGES/DRESSINGS) ×1
DERMABOND ADVANCED .7 DNX12 (GAUZE/BANDAGES/DRESSINGS) ×1 IMPLANT
DRAPE C-ARM 42X120 X-RAY (DRAPES) ×2 IMPLANT
ELECT REM PT RETURN 15FT ADLT (MISCELLANEOUS) ×2 IMPLANT
GLOVE BIO SURGEON STRL SZ7.5 (GLOVE) ×2 IMPLANT
GOWN STRL REUS W/TWL XL LVL3 (GOWN DISPOSABLE) ×6 IMPLANT
HEMOSTAT SURGICEL 4X8 (HEMOSTASIS) IMPLANT
IV CATH 14GX2 1/4 (CATHETERS) ×2 IMPLANT
KIT BASIN OR (CUSTOM PROCEDURE TRAY) ×2 IMPLANT
KIT TURNOVER KIT A (KITS) IMPLANT
POUCH SPECIMEN RETRIEVAL 10MM (ENDOMECHANICALS) ×2 IMPLANT
SCISSORS LAP 5X35 DISP (ENDOMECHANICALS) ×2 IMPLANT
SET IRRIG TUBING LAPAROSCOPIC (IRRIGATION / IRRIGATOR) ×2 IMPLANT
SET TUBE SMOKE EVAC HIGH FLOW (TUBING) ×2 IMPLANT
SLEEVE XCEL OPT CAN 5 100 (ENDOMECHANICALS) ×4 IMPLANT
SUT MNCRL AB 4-0 PS2 18 (SUTURE) ×2 IMPLANT
TOWEL OR 17X26 10 PK STRL BLUE (TOWEL DISPOSABLE) ×2 IMPLANT
TOWEL OR NON WOVEN STRL DISP B (DISPOSABLE) ×2 IMPLANT
TRAY LAPAROSCOPIC (CUSTOM PROCEDURE TRAY) ×2 IMPLANT
TROCAR BLADELESS OPT 5 100 (ENDOMECHANICALS) ×2 IMPLANT
TROCAR XCEL BLUNT TIP 100MML (ENDOMECHANICALS) ×2 IMPLANT

## 2018-12-25 NOTE — Transfer of Care (Signed)
Immediate Anesthesia Transfer of Care Note  Patient: Lindsey Mason  Procedure(s) Performed: Procedure(s): LAPAROSCOPIC CHOLECYSTECTOMY WITH INTRAOPERATIVE CHOLANGIOGRAM (N/A)  Patient Location: PACU  Anesthesia Type:General  Level of Consciousness:  sedated, patient cooperative and responds to stimulation  Airway & Oxygen Therapy:Patient Spontanous Breathing and Patient connected to face mask oxgen  Post-op Assessment:  Report given to PACU RN and Post -op Vital signs reviewed and stable  Post vital signs:  Reviewed and stable  Last Vitals:  Vitals:   12/24/18 2205 12/25/18 0458  BP: 115/71 105/67  Pulse: 79 80  Resp: 16 16  Temp: 37.2 C 37.1 C  SpO2: 373% 57%    Complications: No apparent anesthesia complications

## 2018-12-25 NOTE — Progress Notes (Signed)
PROGRESS NOTE  Lindsey Mason EQA:[id number] DOB: 08-23-1970 DOA: 12/23/2018 PCP: Lindsey Dance, DO  HPI/Recap of past 24 hours: HPI from Dr Lindsey Mason  is a 48 y.o. female, w Depression, Hyperlipidemia, Dm2, Gerd, Pcos apparently c/o n/v, abdominal pain X 2 days, associated with low grade temp but not exceeding 100 per pt.  Pt denies cp, palp, sob, brbpr, black stool, dysuria, hematuria.  Pt notes that she restarted her Ozempic which has caused nausea and vomitting in the past.  In the ED, patient had a white count of 16.3, CT abdomen pelvis showed distended gallbladder with some asymmetric wall thickening, possible acute cholecystitis, follow-up with ultrasound recommended.  Patient admitted for further management   Today, patient reported headaches, still with abdominal pain.  Saw patient prior to lap chole.  Assessment/Plan: Principal Problem:   Nausea and vomiting Active Problems:   Poorly controlled type 2 diabetes mellitus (HCC)   Hyperlipidemia associated with type 2 diabetes mellitus (HCC)   PCOS (polycystic ovarian syndrome)   Alternating constipation and diarrhea   Nausea & vomiting   Generalized abdominal pain  Acute cholecystitis status post lap chole with intraoperative cholangiogram on 12/25/2018 Currently afebrile, with resolved leukocytosis LFTs, lipase all within normal CT abdomen pelvis showed distended gallbladder with some asymmetric wall thickening, possible acute cholecystitis, follow-up with ultrasound recommended Abdominal ultrasound showed cholelithiasis with a focal area of gallbladder wall thickening.  Multiple gallstones noted, recommend HIDA scan HIDA scan showed cystic duct obstruction General surgery on board IVF Monitor closely  GERD Discontinue Zantac as this has been recalled due to ? Cancer causing etiology Start Protonix  Diabetes mellitus type 2 Last A1c 5.9 on 12/2017 Hold Ozempic/ Metformin as above SSI, hypoglycemic protocol   Hypertension BP stable  Depression Cont Wellbutrin, Prozac         Malnutrition Type:      Malnutrition Characteristics:      Nutrition Interventions:       Estimated body mass index is 27.97 kg/m as calculated from the following:   Height as of this encounter: 5\' 6"  (1.676 m).   Weight as of this encounter: 78.6 kg.     Code Status: Full  Family Communication: None at bedside  Disposition Plan: To be determined   Consultants:  General surgery  Procedures:  Lap Chole  Antimicrobials:  None  DVT prophylaxis: Lovenox   Objective: Vitals:   12/25/18 1345 12/25/18 1437 12/25/18 1559 12/25/18 1700  BP: 133/81 129/84 129/81 122/77  Pulse: 98 98 92 95  Resp: 12 14 16 16   Temp: 98.5 F (36.9 C) 98.5 F (36.9 C) 98.2 F (36.8 C) 98.3 F (36.8 C)  TempSrc: Oral Oral Oral Oral  SpO2: 99% 99% 96% 97%  Weight:      Height:        Intake/Output Summary (Last 24 hours) at 12/25/2018 1801 Last data filed at 12/25/2018 1600 Gross per 24 hour  Intake 1905.17 ml  Output 2775 ml  Net -869.83 ml   Filed Weights   12/23/18 1923 12/24/18 0130 12/25/18 0458  Weight: 77.1 kg 78.2 kg 78.6 kg    Exam:  General: NAD   Cardiovascular: S1, S2 present  Respiratory: CTAB  Abdomen: Soft, +tender, nondistended, bowel sounds present  Musculoskeletal: No bilateral pedal edema noted  Skin: Normal  Psychiatry: Normal mood   Data Reviewed: CBC: Recent Labs  Lab 12/23/18 1934 12/24/18 0331 12/25/18 0345  WBC 16.3* 12.8* 7.6  NEUTROABS 14.1*  --  4.1  HGB 16.0* 13.2 12.1  HCT 48.5* 41.3 37.4  MCV 88.7 90.8 91.4  PLT 210 235 916   Basic Metabolic Panel: Recent Labs  Lab 12/23/18 1958 12/24/18 0331 12/25/18 0345  NA 135 137 139  K 4.2 3.8 3.2*  CL 102 109 109  CO2 22 23 23   GLUCOSE 254* 249* 125*  BUN 11 9 9   CREATININE 0.44 0.41* 0.43*  CALCIUM 10.4* 9.5 8.0*   GFR: Estimated Creatinine Clearance: 91 mL/min (A) (by C-G formula  based on SCr of 0.43 mg/dL (L)). Liver Function Tests: Recent Labs  Lab 12/23/18 1958 12/24/18 0331  AST 11* 9*  ALT 18 15  ALKPHOS 66 54  BILITOT 1.2 1.0  PROT 6.9 6.1*  ALBUMIN 4.0 3.4*   Recent Labs  Lab 12/23/18 1958  LIPASE 24   No results for input(s): AMMONIA in the last 168 hours. Coagulation Profile: No results for input(s): INR, PROTIME in the last 168 hours. Cardiac Enzymes: No results for input(s): CKTOTAL, CKMB, CKMBINDEX, TROPONINI in the last 168 hours. BNP (last 3 results) No results for input(s): PROBNP in the last 8760 hours. HbA1C: Recent Labs    12/25/18 0345  HGBA1C 11.3*   CBG: Recent Labs  Lab 12/25/18 0003 12/25/18 0421 12/25/18 0733 12/25/18 1246 12/25/18 1658  GLUCAP 130* 126* 127* 219* 204*   Lipid Profile: No results for input(s): CHOL, HDL, LDLCALC, TRIG, CHOLHDL, LDLDIRECT in the last 72 hours. Thyroid Function Tests: No results for input(s): TSH, T4TOTAL, FREET4, T3FREE, THYROIDAB in the last 72 hours. Anemia Panel: No results for input(s): VITAMINB12, FOLATE, FERRITIN, TIBC, IRON, RETICCTPCT in the last 72 hours. Urine analysis:    Component Value Date/Time   COLORURINE YELLOW 12/23/2018 2039   APPEARANCEUR CLEAR 12/23/2018 2039   LABSPEC 1.015 12/23/2018 2039   PHURINE 6.0 12/23/2018 2039   GLUCOSEU >=500 (A) 12/23/2018 2039   HGBUR TRACE (A) 12/23/2018 2039   BILIRUBINUR NEGATIVE 12/23/2018 2039   BILIRUBINUR negative 11/19/2017 1429   KETONESUR >80 (A) 12/23/2018 2039   PROTEINUR NEGATIVE 12/23/2018 2039   UROBILINOGEN 0.2 11/19/2017 1429   NITRITE NEGATIVE 12/23/2018 2039   LEUKOCYTESUR NEGATIVE 12/23/2018 2039   Sepsis Labs: @LABRCNTIP (procalcitonin:4,lacticidven:4)  ) Recent Results (from the past 240 hour(s))  SARS Coronavirus 2 (Hosp order,Performed in [place] lab via [employer] ID)     Status: None   Collection Time: 12/23/18 11:10 PM   Specimen: Dry Nasal Swab ([facility] ID Now)  Result Value Ref Range  Status   SARS Coronavirus 2 ([facility] ID Now) NEGATIVE NEGATIVE Final    Comment: (NOTE) Interpretive Result Comment(s): COVID 19 Positive SARS CoV 2 target nucleic acids are DETECTED. The SARS CoV 2 RNA is generally detectable in upper and lower respiratory specimens during the acute phase of infection.  Positive results are indicative of active infection with SARS CoV 2.  Clinical correlation with patient history and other diagnostic information is necessary to determine patient infection status.  Positive results do not rule out bacterial infection or coinfection with other viruses. The expected result is Negative. COVID 19 Negative SARS CoV 2 target nucleic acids are NOT DETECTED. The SARS CoV 2 RNA is generally detectable in upper and lower respiratory specimens during the acute phase of infection.  Negative results do not preclude SARS CoV 2 infection, do not rule out coinfections with other pathogens, and should not be used as the sole basis for treatment or other patient management decisions.  Negative results must be combined with clinical  observations, patient history, and epidemiological information. The expected result is Negative. Invalid Presence or absence of SARS CoV 2 nucleic acids cannot be determined. Repeat testing was performed on the submitted specimen and repeated Invalid results were obtained.  If clinically indicated, additional testing on a new specimen with an alternate test methodology [phone]) is advised.  The SARS CoV 2 RNA is generally detectable in upper and lower respiratory specimens during the acute phase of infection. The expected result is Negative. Fact Sheet for Patients:  GolfingFamily.no Fact Sheet for Healthcare Providers: https://www.hernandez-brewer.com/ This test is not yet approved or cleared by the [place] FDA and has been authorized for detection and/or diagnosis of SARS CoV 2 by FDA under an  Emergency Use Authorization (EUA).  This EUA will remain in effect (meaning this test can be used) for the duration of the COVID19 d eclaration under Section 564(b)(1) of the Act, 21 U.S.C. section [phone] 3(b)(1), unless the authorization is terminated or revoked sooner. Performed at [facility], [address], [place], [place] [address]   Surgical pcr screen     Status: Abnormal   Collection Time: 12/24/18  9:37 PM   Specimen: Nasal Mucosa; Nasal Swab  Result Value Ref Range Status   MRSA, PCR NEGATIVE NEGATIVE Final   Staphylococcus aureus POSITIVE (A) NEGATIVE Final    Comment: (NOTE) The Xpert SA Assay (FDA approved for NASAL specimens in patients 35 years of age and older), is one component of a comprehensive surveillance program. It is not intended to diagnose infection nor to guide or monitor treatment. Performed at [facility], [address], [place], [place] [address]       Studies: Dg Cholangiogram Operative  Result Date: 12/25/2018 CLINICAL DATA:  Acute cholecystitis EXAM: INTRAOPERATIVE CHOLANGIOGRAM TECHNIQUE: Cholangiographic images from the C-arm fluoroscopic device were submitted for interpretation post-operatively. Please see the procedural report for the amount of contrast and the fluoroscopy time utilized. COMPARISON:  Scintigraphy 12/24/2018 FINDINGS: No persistent filling defects in the common duct. Intrahepatic ducts are incompletely visualized, appearing decompressed centrally. Contrast passes into the duodenum. : Negative for retained common duct stone. Electronically Signed   By: Lucrezia Europe M.D.   On: 12/25/2018 12:00    Scheduled Meds: . buPROPion  300 mg Oral Daily  . Chlorhexidine Gluconate Cloth  6 each Topical Daily  . [START ON 12/26/2018] enoxaparin (LOVENOX) injection  40 mg Subcutaneous Daily  . fluticasone  1 spray Each Nare Daily  . HYDROmorphone      . HYDROmorphone      . insulin aspart      . insulin  aspart  0-9 Units Subcutaneous Q4H  .  morphine injection  3 mg Intravenous Once  . mupirocin ointment  1 application Nasal BID  . ondansetron      . pantoprazole  40 mg Oral Daily    Continuous Infusions: . sodium chloride 75 mL/hr at 12/25/18 1544     LOS: 0 days     Alma Friendly, MD [facility]  If 7PM-7AM, please contact night-coverage [email or web address] 12/25/2018, 6:01 PM

## 2018-12-25 NOTE — Op Note (Signed)
12/25/2018  12:11 PM  PATIENT:  Lindsey Mason  48 y.o. female  PRE-OPERATIVE DIAGNOSIS:  Acute cholecystitis  POST-OPERATIVE DIAGNOSIS:  Acute cholecystitis with cholelithiasis  PROCEDURE:  Procedure(s): LAPAROSCOPIC CHOLECYSTECTOMY WITH INTRAOPERATIVE CHOLANGIOGRAM (N/A)  SURGEON:  Surgeon(s) and Role:    Jovita Kussmaul, MD - Primary  PHYSICIAN ASSISTANT:   ASSISTANTS: none   ANESTHESIA:   local and general  EBL:  50 mL   BLOOD ADMINISTERED:none  DRAINS: none   LOCAL MEDICATIONS USED:  MARCAINE     SPECIMEN:  Source of Specimen:  gallbladder  DISPOSITION OF SPECIMEN:  PATHOLOGY  COUNTS:  YES  TOURNIQUET:  * No tourniquets in log *  DICTATION: .Dragon Dictation     Procedure: After informed consent was obtained the patient was brought to the operating room and placed in the supine position on the operating room table. After adequate induction of general anesthesia the patient's abdomen was prepped with ChloraPrep allowed to dry and draped in usual sterile manner. An appropriate timeout was performed. The area below the umbilicus was infiltrated with quarter percent  Marcaine. A small incision was made with a 15 blade knife. The incision was carried down through the subcutaneous tissue bluntly with a hemostat and [employer] retractors. The linea alba was identified. The linea alba was incised with a 15 blade knife and each side was grasped with Coker clamps. The preperitoneal space was then probed with a hemostat until the peritoneum was opened and access was gained to the abdominal cavity. A 0 Vicryl pursestring stitch was placed in the fascia surrounding the opening. A Hassan cannula was then placed through the opening and anchored in place with the previously placed Vicryl purse string stitch. The abdomen was insufflated with carbon dioxide without difficulty. A laparoscope was inserted through the [facility] cannula in the right upper quadrant was inspected. Next the  epigastric region was infiltrated with % Marcaine. A small incision was made with a 15 blade knife. A 5 mm port was placed bluntly through this incision into the abdominal cavity under direct vision. Next 2 sites were chosen laterally on the right side of the abdomen for placement of 5 mm ports. Each of these areas was infiltrated with quarter percent Marcaine. Small stab incisions were made with a 15 blade knife. 5 mm ports were then placed bluntly through these incisions into the abdominal cavity under direct vision without difficulty. A blunt grasper was placed through the lateralmost 5 mm port and used to grasp the dome of the gallbladder and elevated anteriorly and superiorly. Another blunt grasper was placed through the other 5 mm port and used to retract the body and neck of the gallbladder. A dissector was placed through the epigastric port and using the electrocautery the peritoneal reflection at the gallbladder neck was opened. Blunt dissection was then carried out in this area until the gallbladder neck-cystic duct junction was readily identified and a good window was created. A single clip was placed on the gallbladder neck. A small  ductotomy was made just below the clip with laparoscopic scissors. A 14-gauge Angiocath was then placed through the anterior abdominal wall under direct vision. A Reddick cholangiogram catheter was then placed through the Angiocath and flushed. The catheter was then placed in the cystic duct and anchored in place with a clip. A cholangiogram was obtained that showed no filling defects good emptying into the duodenum an adequate length on the cystic duct. The anchoring clip and catheters were then removed from  the patient. 3 clips were placed proximally on the cystic duct and the duct was divided between the 2 sets of clips. Posterior to this the cystic artery was identified and again dissected bluntly in a circumferential manner until a good window  was created. 2 clips  were placed proximally and one distally on the artery and the artery was divided between the 2 sets of clips. Next a laparoscopic hook cautery device was used to separate the gallbladder from the liver bed. Prior to completely detaching the gallbladder from the liver bed the liver bed was inspected and several small bleeding points were coagulated with the electrocautery until the area was completely hemostatic. The gallbladder was then detached the rest of it from the liver bed without difficulty. A laparoscopic bag was inserted through the hassan port. The laparoscope was moved to the epigastric port. The gallbladder was placed within the bag and the bag was sealed.  The bag with the gallbladder was then removed with the [facility] cannula through the infraumbilical port without difficulty. The fascial defect was then closed with the previously placed Vicryl pursestring stitch as well as with another figure-of-eight 0 Vicryl stitch. The liver bed was inspected again and found to be hemostatic. The abdomen was irrigated with copious amounts of saline until the effluent was clear. The ports were then removed under direct vision without difficulty and were found to be hemostatic. The gas was allowed to escape. The skin incisions were all closed with interrupted 4-0 Monocryl subcuticular stitches. Dermabond dressings were applied. The patient tolerated the procedure well. At the end of the case all needle sponge and instrument counts were correct. The patient was then awakened and taken to recovery in stable condition  PLAN OF CARE: Admit to inpatient   PATIENT DISPOSITION:  PACU - hemodynamically stable.   Delay start of Pharmacological VTE agent (>24hrs) due to surgical blood loss or risk of bleeding: no

## 2018-12-25 NOTE — Interval H&P Note (Signed)
History and Physical Interval Note:  12/25/2018 10:26 AM  Lindsey Mason  has presented today for surgery, with the diagnosis of Acute cholecystitis.  The various methods of treatment have been discussed with the patient and family. After consideration of risks, benefits and other options for treatment, the patient has consented to  Procedure(s): LAPAROSCOPIC CHOLECYSTECTOMY WITH INTRAOPERATIVE CHOLANGIOGRAM (N/A) as a surgical intervention.  The patient's history has been reviewed, patient examined, no change in status, stable for surgery.  I have reviewed the patient's chart and labs.  Questions were answered to the patient's satisfaction.     Autumn Messing III

## 2018-12-25 NOTE — Anesthesia Preprocedure Evaluation (Signed)
Anesthesia Evaluation  Patient identified by MRN, date of birth, ID band Patient awake    Reviewed: Allergy & Precautions, NPO status , Patient's Chart, lab work & pertinent test results  Airway Mallampati: I  TM Distance: >3 FB Neck ROM: Full    Dental   Pulmonary former smoker,    Pulmonary exam normal        Cardiovascular Normal cardiovascular exam     Neuro/Psych Depression    GI/Hepatic GERD  Medicated and Controlled,  Endo/Other  diabetes, Type 2, Oral Hypoglycemic Agents  Renal/GU      Musculoskeletal   Abdominal   Peds  Hematology   Anesthesia Other Findings   Reproductive/Obstetrics                             Anesthesia Physical Anesthesia Plan  ASA: II  Anesthesia Plan: General   Post-op Pain Management:    Induction: Intravenous  PONV Risk Score and Plan: 3 and Ondansetron, Midazolam and Dexamethasone  Airway Management Planned: Oral ETT  Additional Equipment:   Intra-op Plan:   Post-operative Plan: Extubation in OR  Informed Consent: I have reviewed the patients History and Physical, chart, labs and discussed the procedure including the risks, benefits and alternatives for the proposed anesthesia with the patient or authorized representative who has indicated his/her understanding and acceptance.       Plan Discussed with: CRNA and Surgeon  Anesthesia Plan Comments:         Anesthesia Quick Evaluation

## 2018-12-25 NOTE — Anesthesia Procedure Notes (Signed)
Procedure Name: Intubation Date/Time: 12/25/2018 11:02 AM Performed by: Lind Covert, CRNA Pre-anesthesia Checklist: Patient identified, Emergency Drugs available, Suction available, Patient being monitored and Timeout performed Patient Re-evaluated:Patient Re-evaluated prior to induction Oxygen Delivery Method: Circle system utilized Preoxygenation: Pre-oxygenation with 100% oxygen Induction Type: IV induction Ventilation: Mask ventilation without difficulty Laryngoscope Size: Mac and 3 Grade View: Grade I Tube type: Oral Tube size: 7.0 mm Number of attempts: 1 Airway Equipment and Method: Stylet Placement Confirmation: ETT inserted through vocal cords under direct vision Secured at: 22 cm Tube secured with: Tape Dental Injury: Teeth and Oropharynx as per pre-operative assessment

## 2018-12-25 NOTE — Anesthesia Postprocedure Evaluation (Signed)
Anesthesia Post Note  Patient: Lindsey Mason  Procedure(s) Performed: LAPAROSCOPIC CHOLECYSTECTOMY WITH INTRAOPERATIVE CHOLANGIOGRAM (N/A Abdomen)     Patient location during evaluation: PACU Anesthesia Type: General Level of consciousness: awake and alert Pain management: pain level controlled Vital Signs Assessment: post-procedure vital signs reviewed and stable Respiratory status: spontaneous breathing, nonlabored ventilation, respiratory function stable and patient connected to nasal cannula oxygen Cardiovascular status: blood pressure returned to baseline and stable Postop Assessment: no apparent nausea or vomiting Anesthetic complications: no    Last Vitals:  Vitals:   12/25/18 1220 12/25/18 1245  BP:  137/85  Pulse:  93  Resp:  10  Temp: 36.6 C   SpO2:  99%    Last Pain:  Vitals:   12/25/18 1245  TempSrc:   PainSc: 6                  Deven Furia DAVID

## 2018-12-26 ENCOUNTER — Encounter (HOSPITAL_COMMUNITY): Payer: Self-pay | Admitting: General Surgery

## 2018-12-26 LAB — GLUCOSE, CAPILLARY
Glucose-Capillary: 119 mg/dL — ABNORMAL HIGH (ref 70–99)
Glucose-Capillary: 138 mg/dL — ABNORMAL HIGH (ref 70–99)
Glucose-Capillary: 157 mg/dL — ABNORMAL HIGH (ref 70–99)
Glucose-Capillary: 184 mg/dL — ABNORMAL HIGH (ref 70–99)
Glucose-Capillary: 191 mg/dL — ABNORMAL HIGH (ref 70–99)

## 2018-12-26 LAB — CBC WITH DIFFERENTIAL/PLATELET
Abs Immature Granulocytes: 0.07 10*3/uL (ref 0.00–0.07)
Basophils Absolute: 0 10*3/uL (ref 0.0–0.1)
Basophils Relative: 0 %
Eosinophils Absolute: 0.1 10*3/uL (ref 0.0–0.5)
Eosinophils Relative: 1 %
HCT: 38.2 % (ref 36.0–46.0)
Hemoglobin: 12.2 g/dL (ref 12.0–15.0)
Immature Granulocytes: 1 %
Lymphocytes Relative: 22 %
Lymphs Abs: 2.3 10*3/uL (ref 0.7–4.0)
MCH: 28.8 pg (ref 26.0–34.0)
MCHC: 31.9 g/dL (ref 30.0–36.0)
MCV: 90.1 fL (ref 80.0–100.0)
Monocytes Absolute: 0.7 10*3/uL (ref 0.1–1.0)
Monocytes Relative: 7 %
Neutro Abs: 7.5 10*3/uL (ref 1.7–7.7)
Neutrophils Relative %: 69 %
Platelets: 212 10*3/uL (ref 150–400)
RBC: 4.24 MIL/uL (ref 3.87–5.11)
RDW: 12.6 % (ref 11.5–15.5)
WBC: 10.7 10*3/uL — ABNORMAL HIGH (ref 4.0–10.5)
nRBC: 0 % (ref 0.0–0.2)

## 2018-12-26 LAB — BASIC METABOLIC PANEL
Anion gap: 9 (ref 5–15)
BUN: 8 mg/dL (ref 6–20)
CO2: 23 mmol/L (ref 22–32)
Calcium: 7.8 mg/dL — ABNORMAL LOW (ref 8.9–10.3)
Chloride: 106 mmol/L (ref 98–111)
Creatinine, Ser: 0.3 mg/dL — ABNORMAL LOW (ref 0.44–1.00)
GFR calc Af Amer: >60 mL/min (ref >60)
GFR calc non Af Amer: >60 mL/min (ref >60)
Glucose, Bld: 160 mg/dL — ABNORMAL HIGH (ref 70–99)
Potassium: 3.2 mmol/L — ABNORMAL LOW (ref 3.5–5.1)
Sodium: 138 mmol/L (ref 135–145)

## 2018-12-26 MED ORDER — ACETAMINOPHEN 325 MG PO TABS: 650.0000 mg | ORAL_TABLET | Freq: Four times a day (QID) | ORAL | Status: DC | PRN

## 2018-12-26 MED ORDER — PANTOPRAZOLE SODIUM 40 MG PO TBEC: 40.0000 mg | DELAYED_RELEASE_TABLET | Freq: Every day | ORAL | 0 refills | Status: DC

## 2018-12-26 MED ORDER — CHLORASEPTIC WARM SORE THROAT 1.4 % MT LIQD: 1.0000 | OROMUCOSAL | 0 refills | Status: DC | PRN

## 2018-12-26 MED ORDER — ONDANSETRON HCL 4 MG PO TABS: 4.0000 mg | ORAL_TABLET | Freq: Three times a day (TID) | ORAL | 0 refills | Status: AC | PRN

## 2018-12-26 MED ORDER — TRAMADOL HCL 50 MG PO TABS: 50.0000 mg | ORAL_TABLET | Freq: Four times a day (QID) | ORAL | 0 refills | Status: DC | PRN

## 2018-12-26 MED ORDER — POTASSIUM CHLORIDE 20 MEQ PO PACK
40.0000 meq | PACK | Freq: Once | ORAL | Status: AC
  Administered 2018-12-26: 10:00:00 40 meq via ORAL
  Filled 2018-12-26: qty 2

## 2018-12-26 NOTE — Discharge Summary (Signed)
Discharge Summary  Lindsey Mason PQZ:[id number] DOB: 10/09/1970  PCP: Mellody Dance, DO  Admit date: 12/23/2018 Discharge date: 12/26/2018  Time spent: 45 mins  Recommendations for Outpatient Follow-up:  1. Follow-up with PCP in 1 week with follow-up labs, medication management (whether or not to continue Ozempic) 2. Follow-up with general surgery  Discharge Diagnoses:  Active Hospital Problems   Diagnosis Date Noted   Nausea and vomiting 12/24/2018   Nausea & vomiting 12/24/2018   Generalized abdominal pain 12/24/2018   Alternating constipation and diarrhea 03/10/2018   PCOS (polycystic ovarian syndrome) 05/27/2012   Hyperlipidemia associated with type 2 diabetes mellitus ([place]) 04/11/2009   Poorly controlled type 2 diabetes mellitus ([place]) 03/08/2009    Resolved Hospital Problems  No resolved problems to display.    Discharge Condition: Stable  Diet recommendation: As tolerated  Vitals:   12/26/18 1124 12/26/18 1300  BP: 115/75 122/70  Pulse: 92 89  Resp: 16 17  Temp: 98.9 F (37.2 C) 98.2 F (36.8 C)  SpO2: 98% 100%    History of present illness:  Lindsey a48 Mason,w Depression, Hyperlipidemia, Dm2, Gerd, Pcos apparently c/o n/v, abdominal pain X 2 days, associated with low grade temp but not exceeding 100 per pt. Pt denies cp, palp, sob, brbpr, black stool, dysuria, hematuria. Pt notes that she restarted her Ozempic which has caused nausea and vomitting in the past.  In the ED, patient had a white count of 16.3, CT abdomen pelvis showed distended gallbladder with some asymmetric wall thickening, possible acute cholecystitis, follow-up with ultrasound recommended.  Patient admitted for further management.    Today, patient reported feeling better, complained of a little nausea L in the morning but currently resolved.  Was able to tolerate a soft diet without any worsening nausea or vomiting.  Ambulated in the hallway without any distress.   Patient noted to be a bit teary while seen her this morning.  Patient reported home/family stressors, but reports her Wellbutrin usually does help her and she will be following up with her primary care who she sees for her mental health issues.  Patient denies any SI/HI.  Patient eager to be discharged.  Advised for close follow-up with PCP and general surgery.  Hospital Course:  Principal Problem:   Nausea and vomiting Active Problems:   Poorly controlled type 2 diabetes mellitus (H[facility])   Hyperlipidemia associated with type 2 diabetes mellitus (H[facility])   PCOS (polycystic ovarian syndrome)   Alternating constipation and diarrhea   Nausea & vomiting   Generalized abdominal pain  Acute cholecystitis status post lap chole with intraoperative cholangiogram on 12/25/2018 Currently afebrile, with resolved leukocytosis LFTs, lipase all within normal CT abdomen pelvis showed distended gallbladder with some asymmetric wall thickening, possible acute cholecystitis, follow-up with ultrasound recommended Abdominal ultrasound showed cholelithiasis with a focal area of gallbladder wall thickening.  Multiple gallstones noted, recommend HIDA scan HIDA scan showed cystic duct obstruction General surgery on board status post lap chole Pain management by general surgery Follow-up with general surgery  GERD Discontinued Zantac as this has been recalled due to ? Cancer causing etiology Discharge on Protonix  Diabetes mellitus type 2 Last A1c 5.9 on 12/2017 Pt reported some nausea with Ozempic, and severe headache with insulin Advised to discuss extensively with her PCP for management of her diabetes Continue home regimen for now  Hypertension BP stable  Depression Cont Wellbutrin, Prozac         Malnutrition Type:      Malnutrition Characteristics:  Nutrition Interventions:      Estimated body mass index is 28.08 kg/m as calculated from the following:   Height as of this  encounter: 5\' 6"  (1.676 m).   Weight as of this encounter: 78.9 kg.    Procedures:  Lap chole on 12/25/2018  Consultations:  General surgery  Discharge Exam: BP 122/70 (BP Location: Right Arm)    Pulse 89    Temp 98.2 F (36.8 C) (Oral)    Resp 17    Ht 5\' 6"  (1.676 m)    Wt 78.9 kg    SpO2 100%    BMI 28.08 kg/m   General: NAD Cardiovascular: S1, S2 present Respiratory: CTAB  Discharge Instructions You were cared for by a hospitalist during your hospital stay. If you have any questions about your discharge medications or the care you received while you were in the hospital after you are discharged, you can call the unit and asked to speak with the hospitalist on call if the hospitalist that took care of you is not available. Once you are discharged, your primary care physician will handle any further medical issues. Please note that NO REFILLS for any discharge medications will be authorized once you are discharged, as it is imperative that you return to your primary care physician (or establish a relationship with a primary care physician if you do not have one) for your aftercare needs so that they can reassess your need for medications and monitor your lab values.   Allergies as of 12/26/2018      Reactions   Lantus [insulin Glargine] Other (See Comments)   headaches      Medication List    STOP taking these medications   doxycycline 100 MG tablet Commonly known as: VIBRA-TABS   losartan 50 MG tablet Commonly known as: COZAAR   ranitidine 300 MG tablet Commonly known as: ZANTAC     TAKE these medications   acetaminophen 325 MG tablet Commonly known as: TYLENOL Take 2 tablets (650 mg total) by mouth every 6 (six) hours as needed for mild pain or moderate pain.   buPROPion 300 MG 24 hr tablet Commonly known as: WELLBUTRIN XL Take 1 tablet (300 mg total) by mouth daily. Needs office visit for refills What changed: additional instructions   Chloraseptic Warm Sore  Throat 1.4 % Liqd Generic drug: phenol Use as directed 1 spray in the mouth or throat as needed for throat irritation / pain.   chlorpheniramine-HYDROcodone 10-8 MG/5ML Suer Commonly known as: TUSSIONEX Take 5 mLs by mouth every 12 (twelve) hours as needed for cough (cough, will cause drowsiness.).   FLUoxetine 10 MG tablet Commonly known as: PROZAC Take 1 tablet (10 mg total) by mouth daily for 30 days. Needs office visit for refills   fluticasone 50 MCG/ACT nasal spray Commonly known as: FLONASE 1 spray each nostril after sinus rinse twice daily What changed:   how much to take  how to take this  when to take this  additional instructions  Another medication with the same name was removed. Continue taking this medication, and follow the directions you see here.   ibuprofen 200 MG tablet Commonly known as: ADVIL Take 400 mg by mouth every 6 (six) hours as needed for moderate pain. What changed: Another medication with the same name was removed. Continue taking this medication, and follow the directions you see here.   metFORMIN 500 MG 24 hr tablet Commonly known as: GLUCOPHAGE-XR TAKE 4 TABLETS BY MOUTH ONCE DAILY WITH  BREAKFAST What changed:   how much to take  how to take this  when to take this  additional instructions   ondansetron 4 MG tablet Commonly known as: Zofran Take 1 tablet (4 mg total) by mouth every 8 (eight) hours as needed for up to 30 days for nausea or vomiting.   pantoprazole 40 MG tablet Commonly known as: PROTONIX Take 1 tablet (40 mg total) by mouth daily. Start taking on: December 27, 2018   Semaglutide (1 MG/DOSE) 2 MG/1.5ML Sopn Commonly known as: Ozempic (1 MG/DOSE) Inject 1 mg into the skin once a week. Needs office visit for refills What changed:   when to take this  additional instructions   traMADol 50 MG tablet Commonly known as: Ultram Take 1 tablet (50 mg total) by mouth every 6 (six) hours as needed.   Vitamin D  (Ergocalciferol) 1.25 MG (50000 UT) Caps capsule Commonly known as: DRISDOL Please take 1 tablet Wednesday and 1 tablet Sunday every week      Allergies  Allergen Reactions   Lantus [Insulin Glargine] Other (See Comments)    headaches   Follow-up Information    [facility] Follow up in 3 week(s).   Specialty: General Surgery Contact information: [place] [address] [phone]        Mellody Dance, DO. Schedule an appointment as soon as possible for a visit in 1 week(s).   Specialty: Family Medicine Contact information: [place] [address] [phone]            The results of significant diagnostics from this hospitalization (including imaging, microbiology, ancillary and laboratory) are listed below for reference.    Significant Diagnostic Studies: Dg Cholangiogram Operative  Result Date: 12/25/2018 CLINICAL DATA:  Acute cholecystitis EXAM: INTRAOPERATIVE CHOLANGIOGRAM TECHNIQUE: Cholangiographic images from the C-arm fluoroscopic device were submitted for interpretation post-operatively. Please see the procedural report for the amount of contrast and the fluoroscopy time utilized. COMPARISON:  Scintigraphy 12/24/2018 FINDINGS: No persistent filling defects in the common duct. Intrahepatic ducts are incompletely visualized, appearing decompressed centrally. Contrast passes into the duodenum. : Negative for retained common duct stone. Electronically Signed   By: Lucrezia Europe M.D.   On: 12/25/2018 12:00   Nm Hepatobiliary Liver Func  Result Date: 12/24/2018 CLINICAL DATA:  Abdominal pain, nausea and vomiting since 12/22/2018 EXAM: NUCLEAR MEDICINE HEPATOBILIARY IMAGING TECHNIQUE: Sequential images of the abdomen were obtained out to 60 minutes following intravenous administration of radiopharmaceutical. RADIOPHARMACEUTICALS:  5.15 mCi Tc-14m  Choletec IV COMPARISON:  Comparison CT and ultrasound examinations  12/23/2018 FINDINGS: Prompt and symmetric uptake noted in the liver and prompt excretion into the biliary tree which is visualized by 10 minutes. Activity is seen in the small bowel by 20 minutes. The gallbladder is not visualized during the first 90 minutes. Patient was subsequently given 3 mg of morphine IV and additional 30 minutes of imaging was performed. The gallbladder was not visualized. Findings consistent with cystic duct obstruction. IMPRESSION: 1. Non visualization of the gallbladder consistent with cystic duct obstruction. 2. Patent common bile duct. Electronically Signed   By: Marijo Sanes M.D.   On: 12/24/2018 14:23   Ct Abdomen Pelvis W Contrast  Result Date: 12/23/2018 CLINICAL DATA:  Abdominal distension. EXAM: CT ABDOMEN AND PELVIS WITH CONTRAST TECHNIQUE: Multidetector CT imaging of the abdomen and pelvis was performed using the standard protocol following bolus administration of intravenous contrast. CONTRAST:  146mL OMNIPAQUE IOHEXOL 300 MG/ML  SOLN  COMPARISON:  None. FINDINGS: Lower chest: No acute abnormality. Hepatobiliary: There is hepatic steatosis. The gallbladder is significantly distended. There is some asymmetric wall thickening of the gallbladder. There is no intrahepatic or extrahepatic biliary ductal dilatation. Pancreas: Unremarkable. No pancreatic ductal dilatation or surrounding inflammatory changes. Spleen: Normal in size without focal abnormality. Adrenals/Urinary Tract: Adrenal glands are unremarkable. Kidneys are normal, without renal calculi, focal lesion, or hydronephrosis. The bladder is moderately distended. Stomach/Bowel: The stomach is moderately distended. There is scattered diverticula without CT evidence of diverticulitis. The appendix is not reliably identified. The patient appears to be status post appendectomy. Vascular/Lymphatic: No significant vascular findings are present. No enlarged abdominal or pelvic lymph nodes. Reproductive: The patient appears to be  status post bilateral tubal ligation. One of the tubal ligation clips appears malpositioned and is no longer located in the expected region of the right adnexa. Other: No abdominal wall hernia or abnormality. No abdominopelvic ascites. Musculoskeletal: No acute or significant osseous findings. IMPRESSION: 1. Distended gallbladder with some asymmetric wall thickening. If there is clinical concern for acute cholecystitis, follow-up with ultrasound is recommended. 2. Moderately distended urinary bladder. 3. Distended stomach of unknown clinical significance. 4. Probable malpositioning of the patient's right-sided tubal ligation clip. 5. Hepatic steatosis 6. Scattered colonic diverticula without CT evidence of diverticulitis. There is no evidence of a small-bowel obstruction. The patient appears to be status post prior appendectomy. Electronically Signed   By: Constance Holster M.D.   On: 12/23/2018 21:48   US Abdomen Limited  Result Date: 12/23/2018 CLINICAL DATA:  Nausea and vomiting. EXAM: ULTRASOUND ABDOMEN LIMITED RIGHT UPPER QUADRANT COMPARISON:  CT dated FINDINGS: Gallbladder: Cholelithiasis is noted. There is a single focal area of gallbladder wall thickening measuring up to approximately 3-4 mm. Multiple gallstones are noted near the gallbladder neck. Several these gallstones appear to be nonmobile. The sonographic Percell Miller sign cannot be adequately assessed secondary to patient condition. Common bile duct: Diameter: 0.4 cm Liver: Diffuse increased echogenicity with slightly heterogeneous liver. Appearance typically secondary to fatty infiltration. Fibrosis secondary consideration. No secondary findings of cirrhosis noted. No focal hepatic lesion or intrahepatic biliary duct dilatation. Portal vein is patent on color Doppler imaging with normal direction of blood flow towards the liver. IMPRESSION: 1. Cholelithiasis with a focal area of gallbladder wall thickening. Multiple gallstones are noted near the  gallbladder neck. Many of the stones appear to be nonmobile. The sonographic Percell Miller sign cannot be adequately assessed secondary to patient condition. If there is high clinical suspicion for acute cholecystitis, follow-up with HIDA scan or surgical consultation is recommended. 2. The common bile duct is not dilated. 3. Hepatic steatosis. Electronically Signed   By: Constance Holster M.D.   On: 12/23/2018 22:54    Microbiology: Recent Results (from the past 240 hour(s))  SARS Coronavirus 2 (Hosp order,Performed in [facility] lab via [employer] [facility])     Status: None   Collection Time: 12/23/18 11:10 PM   Specimen: Dry Nasal Swab ([employer] Now)  Result Value Ref Range Status   SARS Coronavirus 2 ([employer] Now) NEGATIVE NEGATIVE Final    Comment: (NOTE) Interpretive Result Comment(s): COVID 19 Positive SARS CoV 2 target nucleic acids are DETECTED. The SARS CoV 2 RNA is generally detectable in upper and lower respiratory specimens during the acute phase of infection.  Positive results are indicative of active infection with SARS CoV 2.  Clinical correlation with patient history and other diagnostic information is necessary to determine patient infection status.  Positive  results do not rule out bacterial infection or coinfection with other viruses. The expected result is Negative. COVID 19 Negative SARS CoV 2 target nucleic acids are NOT DETECTED. The SARS CoV 2 RNA is generally detectable in upper and lower respiratory specimens during the acute phase of infection.  Negative results do not preclude SARS CoV 2 infection, do not rule out coinfections with other pathogens, and should not be used as the sole basis for treatment or other patient management decisions.  Negative results must be combined with clinical  observations, patient history, and epidemiological information. The expected result is Negative. Invalid Presence or absence of SARS CoV 2 nucleic acids cannot  be determined. Repeat testing was performed on the submitted specimen and repeated Invalid results were obtained.  If clinically indicated, additional testing on a new specimen with an alternate test methodology [phone]) is advised.  The SARS CoV 2 RNA is generally detectable in upper and lower respiratory specimens during the acute phase of infection. The expected result is Negative. Fact Sheet for Patients:  G[employer]ingFamily.no Fact Sheet for Healthcare Providers: https://www.hernandez-brewer.com/ This test is not yet approved or cleared by the [place] FDA and has been authorized for detection and/or diagnosis of SARS CoV 2 by FDA under an Emergency Use Authorization (EUA).  This EUA will remain in effect (meaning this test can be used) for the duration of the COVID19 d eclaration under Section 564(b)(1) of the Act, 21 U.S.C. section [phone] 3(b)(1), unless the authorization is terminated or revoked sooner. Performed at [facility], [address], [place], [place] [address]   Surgical pcr screen     Status: Abnormal   Collection Time: 12/24/18  9:37 PM   Specimen: Nasal Mucosa; Nasal Swab  Result Value Ref Range Status   MRSA, PCR NEGATIVE NEGATIVE Final   Staphylococcus aureus POSITIVE (A) NEGATIVE Final    Comment: (NOTE) The Xpert SA Assay (FDA approved for NASAL specimens in patients 32 years of age and older), is one component of a comprehensive surveillance program. It is not intended to diagnose infection nor to guide or monitor treatment. Performed at [facility], [place] [address], [place], [place] [address]      Labs: Basic Metabolic Panel: Recent Labs  Lab 12/23/18 1958 12/24/18 0331 12/25/18 0345 12/26/18 0333  NA 135 137 139 138  K 4.2 3.8 3.2* 3.2*  CL 102 109 109 106  CO2 22 23 23 23   GLUCOSE 254* 249* 125* 160*  BUN 11 9 9 8   CREATININE 0.44 0.41* 0.43* 0.30*  CALCIUM 10.4* 9.5  8.0* 7.8*   Liver Function Tests: Recent Labs  Lab 12/23/18 1958 12/24/18 0331  AST 11* 9*  ALT 18 15  ALKPHOS 66 54  BILITOT 1.2 1.0  PROT 6.9 6.1*  ALBUMIN 4.0 3.4*   Recent Labs  Lab 12/23/18 1958  LIPASE 24   No results for input(s): AMMONIA in the last 168 hours. CBC: Recent Labs  Lab 12/23/18 1934 12/24/18 0331 12/25/18 0345 12/26/18 0333  WBC 16.3* 12.8* 7.6 10.7*  NEUTROABS 14.1*  --  4.1 7.5  HGB 16.0* 13.2 12.1 12.2  HCT 48.5* 41.3 37.4 38.2  MCV 88.7 90.8 91.4 90.1  PLT 210 235 196 212   Cardiac Enzymes: No results for input(s): CKTOTAL, CKMB, CKMBINDEX, TROPONINI in the last 168 hours. BNP: BNP (last 3 results) No results for input(s): BNP in the last 8760 hours.  ProBNP (last 3 results) No results for input(s): PROBNP  in the last 8760 hours.  CBG: Recent Labs  Lab 12/26/18 0028 12/26/18 0424 12/26/18 0804 12/26/18 1129 12/26/18 1609  GLUCAP 157* 138* 119* 191* 184*       Signed:  Alma Friendly, MD [facility] 12/26/2018, 4:34 PM

## 2018-12-26 NOTE — Discharge Instructions (Signed)
[facility] ______[facility] °LAPAROSCOPIC SURGERY: POST OP INSTRUCTIONS °Always review your discharge instruction sheet given to you by the facility where your surgery was performed. °IF YOU HAVE DISABILITY OR FAMILY LEAVE FORMS, YOU MUST BRING THEM TO THE OFFICE FOR PROCESSING.   °DO NOT GIVE THEM TO YOUR DOCTOR. ° °1. A prescription for pain medication may be given to you upon discharge.  Take your pain medication as prescribed, if needed.  If narcotic pain medicine is not needed, then you may take acetaminophen (Tylenol) or ibuprofen (Advil) as needed. °2. Take your usually prescribed medications unless otherwise directed. °3. If you need a refill on your pain medication, please contact your pharmacy.  They will contact our office to request authorization. Prescriptions will not be filled after 5pm or on week-ends. °4. You should follow a light diet the first few days after arrival home, such as soup and crackers, etc.  Be sure to include lots of fluids daily. °5. Most patients will experience some swelling and bruising in the area of the incisions.  Ice packs will help.  Swelling and bruising can take several days to resolve.  °6. It is common to experience some constipation if taking pain medication after surgery.  Increasing fluid intake and taking a stool softener (such as Colace) will usually help or prevent this problem from occurring.  A mild laxative (Milk of Magnesia or Miralax) should be taken according to package instructions if there are no bowel movements after 48 hours. °7. Unless discharge instructions indicate otherwise, you may remove your bandages 24-48 hours after surgery, and you may shower at that time.  You may have steri-strips (small skin tapes) in place directly over the incision.  These strips should be left on the skin for 7-10 days.  If your surgeon used skin glue on the incision, you may shower in 24 hours.  The glue will flake off over the next 2-3 weeks.  Any sutures or  staples will be removed at the office during your follow-up visit. °8. ACTIVITIES:  You may resume regular (light) daily activities beginning the next day--such as daily self-care, walking, climbing stairs--gradually increasing activities as tolerated.  You may have sexual intercourse when it is comfortable.  Refrain from any heavy lifting or straining until approved by your doctor. °a. You may drive when you are no longer taking prescription pain medication, you can comfortably wear a seatbelt, and you can safely maneuver your car and apply brakes. °b. RETURN TO WORK:  __________________________________________________________ °9. You should see your doctor in the office for a follow-up appointment approximately 2-3 weeks after your surgery.  Make sure that you call for this appointment within a day or two after you arrive home to insure a convenient appointment time. °10. OTHER INSTRUCTIONS: __________________________________________________________________________________________________________________________ __________________________________________________________________________________________________________________________ °WHEN TO CALL YOUR DOCTOR: °1. Fever over 101.0 °2. Inability to urinate °3. Continued bleeding from incision. °4. Increased pain, redness, or drainage from the incision. °5. Increasing abdominal pain ° °The clinic staff is available to answer your questions during regular business hours.  Please don’t hesitate to call and ask to speak to one of the nurses for clinical concerns.  If you have a medical emergency, go to the nearest emergency room or call 911.  A surgeon from [facility] is always on call at the hospital. °[address], [address], [place], [place]  [address] ? [address] [address], [place], [place]   [address] °[phone] ? [phone] ? FAX [phone] °Web site:   [email or web address] °

## 2018-12-26 NOTE — Plan of Care (Signed)

## 2018-12-26 NOTE — Progress Notes (Signed)
1 Day Post-Op   Subjective/Chief Complaint: No acute events.  Pain well controlled.  Still having some nausea.   Objective: Vital signs in last 24 hours: Temp:  [97.9 F (36.6 C)-98.6 F (37 C)] 98.2 F (36.8 C) (06/27 0518) Pulse Rate:  [79-98] 79 (06/27 0518) Resp:  [10-16] 16 (06/27 0518) BP: (103-137)/(64-85) 103/64 (06/27 0518) SpO2:  [96 %-100 %] 99 % (06/27 0518) Weight:  [78.9 kg] 78.9 kg (06/27 0518) Last BM Date: 12/23/18  Intake/Output from previous day: 06/26 0701 - 06/27 0700 In: 2866.9 [P.O.:240; I.V.:2626.9] Out: 2775 [Urine:2725; Blood:50] Intake/Output this shift: No intake/output data recorded.  General appearance: alert and cooperative Resp: Unlabored GI: Soft, appropriately tender, nondistended. Incision/Wound: Incisions clean dry intact with Dermabond.  Lab Results:  Recent Labs    12/25/18 0345 12/26/18 0333  WBC 7.6 10.7*  HGB 12.1 12.2  HCT 37.4 38.2  PLT 196 212   BMET Recent Labs    12/25/18 0345 12/26/18 0333  NA 139 138  K 3.2* 3.2*  CL 109 106  CO2 23 23  GLUCOSE 125* 160*  BUN 9 8  CREATININE 0.43* 0.30*  CALCIUM 8.0* 7.8*   PT/INR No results for input(s): LABPROT, INR in the last 72 hours. ABG No results for input(s): PHART, HCO3 in the last 72 hours.  Invalid input(s): PCO2, PO2  Studies/Results: Dg Cholangiogram Operative  Result Date: 12/25/2018 CLINICAL DATA:  Acute cholecystitis EXAM: INTRAOPERATIVE CHOLANGIOGRAM TECHNIQUE: Cholangiographic images from the C-arm fluoroscopic device were submitted for interpretation post-operatively. Please see the procedural report for the amount of contrast and the fluoroscopy time utilized. COMPARISON:  Scintigraphy 12/24/2018 FINDINGS: No persistent filling defects in the common duct. Intrahepatic ducts are incompletely visualized, appearing decompressed centrally. Contrast passes into the duodenum. : Negative for retained common duct stone. Electronically Signed   By: Lucrezia Europe  M.D.   On: 12/25/2018 12:00   Nm Hepatobiliary Liver Func  Result Date: 12/24/2018 CLINICAL DATA:  Abdominal pain, nausea and vomiting since 12/22/2018 EXAM: NUCLEAR MEDICINE HEPATOBILIARY IMAGING TECHNIQUE: Sequential images of the abdomen were obtained out to 60 minutes following intravenous administration of radiopharmaceutical. RADIOPHARMACEUTICALS:  5.15 mCi Tc-57m  Choletec IV COMPARISON:  Comparison CT and ultrasound examinations 12/23/2018 FINDINGS: Prompt and symmetric uptake noted in the liver and prompt excretion into the biliary tree which is visualized by 10 minutes. Activity is seen in the small bowel by 20 minutes. The gallbladder is not visualized during the first 90 minutes. Patient was subsequently given 3 mg of morphine IV and additional 30 minutes of imaging was performed. The gallbladder was not visualized. Findings consistent with cystic duct obstruction. IMPRESSION: 1. Non visualization of the gallbladder consistent with cystic duct obstruction. 2. Patent common bile duct. Electronically Signed   By: Marijo Sanes M.D.   On: 12/24/2018 14:23    Anti-infectives: Anti-infectives (From admission, onward)   Start     Dose/Rate Route Frequency Ordered Stop   12/24/18 1630  ciprofloxacin (CIPRO) IVPB 400 mg  Status:  Discontinued     400 mg 200 mL/hr over 60 Minutes Intravenous Every 12 hours 12/24/18 1537 12/25/18 1349      Assessment/Plan: s/p Procedure(s): LAPAROSCOPIC CHOLECYSTECTOMY WITH INTRAOPERATIVE CHOLANGIOGRAM (N/A) 6/26 Dr. Marlou Starks Patient Active Problem List   Diagnosis Date Noted  . Nausea and vomiting 12/24/2018  . Nausea & vomiting 12/24/2018  . Generalized abdominal pain 12/24/2018  . Statin declined 05/05/2018  . Bloating 03/10/2018  . Alternating constipation and diarrhea 03/10/2018  . Pyrosis 03/10/2018  .  Abnormal liver ultrasound 03/10/2018  . Calculus of gallbladder without cholecystitis without obstruction 03/10/2018  . h/o Enlarged ovaries  11/20/2017  . Environmental and seasonal allergies 11/04/2017  . Psychophysiological insomnia 11/04/2017  . Vitamin D deficiency 08/26/2017  . Depression 09/03/2016  . Overweight (BMI 25.0-29.9) 09/03/2016  . Witnessed episode of apnea 09/03/2016  . Urinary frequency 04/03/2016  . Acute maxillary sinusitis 11/04/2014  . 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 PCOS (polycystic ovarian syndrome) 05/27/2012  . 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 Major depressive disorder, recurrent episode ([place]) 08/20/2010  . Hyperlipidemia associated with type 2 diabetes mellitus ([place]) 04/11/2009  . 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 Poorly controlled type 2 diabetes mellitus ([place]) 03/08/2009  . GERD 03/08/2009    -Advance diet, mobilize, continue pulmonary toilet -Okay for discharge from surgical standpoint this afternoon if tolerating a diet and nausea improved. -Pain meds ordered   LOS: 1 day    Clovis Riley 12/26/2018

## 2018-12-26 NOTE — Progress Notes (Signed)
Discharge instructions reviewed with patient.  These included the following:  Medications and prescriptions, when to call the MD, MD follow-up appointments, incision care, activity recommendations, etc.  Comprehension of material reviewed verified via "teach-back technique".  Patient discharged via private vehicle driven by daughter and was escorted from facility via wheelchair by nurse tech.

## 2018-12-31 ENCOUNTER — Other Ambulatory Visit: Payer: Self-pay

## 2018-12-31 ENCOUNTER — Encounter: Payer: Self-pay | Admitting: Family Medicine

## 2018-12-31 ENCOUNTER — Ambulatory Visit (INDEPENDENT_AMBULATORY_CARE_PROVIDER_SITE_OTHER): Payer: BC Managed Care – PPO | Admitting: Family Medicine

## 2018-12-31 VITALS — BP 127/84 | HR 83 | Temp 98.5°F | Ht 66.0 in | Wt 174.2 lb

## 2018-12-31 DIAGNOSIS — E1165 Type 2 diabetes mellitus with hyperglycemia: Secondary | ICD-10-CM | POA: Diagnosis not present

## 2018-12-31 MED ORDER — INSULIN DETEMIR 100 UNIT/ML ~~LOC~~ SOLN: SUBCUTANEOUS | 1 refills | Status: DC

## 2018-12-31 MED ORDER — INSULIN LISPRO 100 UNIT/ML ~~LOC~~ SOLN: SUBCUTANEOUS | 11 refills | Status: DC

## 2018-12-31 MED ORDER — BLOOD GLUCOSE METER KIT: PACK | 0 refills | Status: AC

## 2018-12-31 NOTE — Patient Instructions (Signed)
DIABETIC SLIDING SCALE (II)  IF BLOOD SUGAR IS:  LESS THAN 120 (NO INSULIN)  130-150 (1-3 UNITS)  150-180 (4-6 UNITS)  181-220 (7-9 UNITS)  221-260 (10-12 UNITS)  261-300 (13-15 UNITS)  301-340 (18 UNITS)  MORE THAN 341 (21 UNITS)     Blood Glucose Monitoring, Adult  Monitoring your blood glucose (also know as blood sugar) helps you to manage your diabetes. It also helps you and your health care provider monitor your diabetes and determine how well your treatment plan is working.  WHY SHOULD YOU MONITOR YOUR BLOOD GLUCOSE?  It can help you understand how food, exercise, and medicine affect your blood glucose.  It allows you to know what your blood glucose is at any given moment. You can quickly tell if you are having low blood glucose (hypoglycemia) or high blood glucose (hyperglycemia).  It can help you and your health care provider know how to adjust your medicines.  It can help you understand how to manage an illness or adjust medicine for exercise.  WHEN SHOULD YOU TEST? Your health care provider will help you decide how often you should check your blood glucose. This may depend on the type of diabetes you have, your diabetes control, or the types of medicines you are taking. Be sure to write down all of your blood glucose readings so that this information can be reviewed with your health care provider. See below for examples of testing times that your health care provider may suggest  Type 2 Diabetes  Check your fasting blood sugar ( usually when you get out of bed)  and 2 hours after your largest meal of the day   HOW TO MONITOR YOUR BLOOD GLUCOSE:  Supplies Needed  Blood glucose meter.  Test strips for your meter. Each meter has its own strips. You must use the strips that go with your own meter.  A pricking needle (lancet).  A device that holds the lancet (lancing device).  A journal or log book to write down your results.   Procedure  Wash your  hands with soap and water. Alcohol is not preferred.  Prick the side of your finger (not the tip) with the lancet.  Gently milk the finger until a small drop of blood appears.  Follow the instructions that come with your meter for inserting the test strip, applying blood to the strip, and using your blood glucose meter.  Other Areas to Get Blood for Testing Some meters allow you to use other areas of your body (other than your finger) to test your blood. These areas are called alternative sites. The most common alternative sites are:  The forearm.  The thigh.  The back area of the lower leg.  The palm of the hand.  The blood flow in these areas is slower. Therefore, the blood glucose values you get may be delayed, and the numbers are different from what you would get from your fingers. Do not use alternative sites if you think you are having hypoglycemia. Your reading will not be accurate. Always use a finger if you are having hypoglycemia. Also, if you cannot feel your lows (hypoglycemia unawareness), always use your fingers for your blood glucose checks.  ADDITIONAL TIPS FOR GLUCOSE MONITORING  Do not reuse lancets.  Always carry your supplies with you.  All blood glucose meters have a 24-hour "hotline" number to call if you have questions or need help.  Adjust (calibrate) your blood glucose meter with a control solution after finishing  a few boxes of strips.  BLOOD GLUCOSE RECORD KEEPING It is a good idea to keep a daily record or log of your blood glucose readings. Most glucose meters, if not all, keep your glucose records stored in the meter. Some meters come with the ability to download your records to your home computer. Keeping a record of your blood glucose readings is especially helpful if you are wanting to look for patterns. Make notes to go along with the blood glucose readings because you might forget what happened at that exact time. Keeping good records helps you and  your health care provider to work together to achieve good diabetes management.    This information is not intended to replace advice given to you by your health care provider. Make sure you discuss any questions you have with your health care provider.   Document Released: 06/20/2003 Document Revised: 07/08/2014 Document Reviewed: 11/09/2012 Elsevier Interactive Patient Education [employer].

## 2018-12-31 NOTE — Progress Notes (Signed)
Impression and Recommendations:    1. Uncontrolled type 2 diabetes mellitus with hyperglycemia, without long-term current use of insulin ([place])      Uncontrolled type 2 diabetes mellitus with hyperglycemia, without long-term current use of insulin ([place])   Education and routine counseling performed. Handouts provided.  Medications Discontinued During This Encounter  Medication Reason   chlorpheniramine-HYDROcodone ([place]) 10-8 MG/5ML SUER Completed Course     The patient was counseled, risk factors were discussed, anticipatory guidance given.  Gross side effects, risk and benefits, and alternatives of medications discussed with patient.  Patient is aware that all medications have potential side effects and we are unable to predict every side effect or drug-drug interaction that may occur.  Expresses verbal understanding and consents to current therapy plan and treatment regimen.   Return for 2) Follow-up 4-6 weeks- log of BS, BP- starting insulin.   Please see AVS handed out to patient at the end of our visit for further patient instructions/ counseling done pertaining to today's office visit.    Note:  This document was prepared using Dragon voice recognition software and may include unintentional dictation errors.    Subjective:    Chief Complaint  Patient presents with   Hospitalization Follow-up  Hosp f/up - due to cholocystitis- s/p sx removal  Dr Sabra Heck remove R fallopian tube in aug 2019 Sent to gi back then due to N/bloating etc.  Tests/ w/up was negative.   -Patient went off her Ozempic when the prescription ran out approximately back in January 6 months ago or so.  She has not had anything except for her metformin since then.    Then, then pt took ozempic- started it back on on 12/08/18 is 1 we wrote the prescription- she took ozempic shot on June 23rd for first.  Then subsequently had these GI symptoms and which led to her gallbladder being taken  out.   Thailyn Jailey Booton is a 48 y.o. female who presents to [place] [facility] today for Diabetes Management.    --> pt lost to f/up - not seen since 11/19.     Pt states that she has been under a lot of stress- is why she is lost to f/up.  48 yo attempted suicide in Feb- was in [place] 14 days.   Was super stressed   DM HPI:  Pt states her blood sugars in the hospital were running in the 130s 140s.  They had her just on sliding scale and she states she had about 1 unit per meal etc.  - pt has no supplies to check her BS and   S-E to lantus severe HA,   -  She has been working on diet and exercise for diabetes  Pt is currently maintained on the following medications for diabetes:   see med list toda   Denies polyuria/polydipsia. Denies hypo/ hyperglycemia symptoms - She denies new onset of: chest pain, exercise intolerance, shortness of breath, dizziness, visual changes, headache, lower extremity swelling or claudication.   Last diabetic eye exam was  Lab Results  Component Value Date   HMDIABEYEEXA No Retinopathy 12/01/2017    Foot exam- UTD  Last A1C in the office was:  Lab Results  Component Value Date   HGBA1C 11.3 (H) 12/25/2018   HGBA1C 5.9 01/27/2018   HGBA1C 9.6 11/04/2017    Lab Results  Component Value Date   MICROALBUR 10 08/04/2017   [place] 81 08/14/2017   CREATININE  0.46 (L) 12/31/2018      Last 3 blood pressure readings in our office are as follows: BP Readings from Last 3 Encounters:  02/04/19 112/75  12/31/18 127/84  12/26/18 122/70    BMI Readings from Last 3 Encounters:  02/04/19 28.29 kg/m  12/31/18 28.12 kg/m  12/26/18 28.08 kg/m     No problems updated.    Patient Care Team    Relationship Specialty Notifications Start End  Mellody Dance, DO PCP - General Family Medicine  08/14/17   Marin Comment, My [place], [place] Referring Physician Optometry  08/04/17      Patient Active Problem List   Diagnosis Date Noted    Major depressive disorder, recurrent episode ([place]) 08/20/2010    Priority: High   Hyperlipidemia associated with type 2 diabetes mellitus ([place]) 04/11/2009    Priority: High   Poorly controlled type 2 diabetes mellitus ([place]) 03/08/2009    Priority: High   Nausea and vomiting 12/24/2018   Nausea & vomiting 12/24/2018   Generalized abdominal pain 12/24/2018   Statin declined 05/05/2018   Bloating 03/10/2018   Alternating constipation and diarrhea 03/10/2018   Pyrosis 03/10/2018   Abnormal liver ultrasound 03/10/2018   Calculus of gallbladder without cholecystitis without obstruction 03/10/2018   h/o Enlarged ovaries 11/20/2017   Environmental and seasonal allergies 11/04/2017   Psychophysiological insomnia 11/04/2017   Vitamin D deficiency 08/26/2017   Depression 09/03/2016   Overweight (BMI 25.0-29.9) 09/03/2016   Witnessed episode of apnea 09/03/2016   Urinary frequency 04/03/2016   Acute maxillary sinusitis 11/04/2014   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   PCOS (polycystic ovarian syndrome) 05/27/2012   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   ACNE ROSACEA 04/11/2009   GERD 03/08/2009     Past Medical History:  Diagnosis Date   Clomid pregnancy 1997, 2005   Depression    Diabetes mellitus without mention of complication    Esophageal reflux    Headache(784.0)    Infertility, female    PCOS - Clomid pregnancies    Ovarian cyst 2019   PCOS (polycystic ovarian syndrome) 05/27/2012   PONV (postoperative nausea and vomiting)    nausea   Pure hypercholesterolemia    Rosacea    Status post appendectomy 11/19/2017     Past Surgical History:  Procedure Laterality Date   APPENDECTOMY     CESAREAN SECTION     x 3, last one with triplets   CHOLECYSTECTOMY N/A 12/25/2018   Procedure: LAPAROSCOPIC CHOLECYSTECTOMY WITH INTRAOPERATIVE CHOLANGIOGRAM;  Surgeon: Jovita Kussmaul, MD;  Location: [facility];  Service: General;  Laterality:  N/A;   LAPAROSCOPIC BILATERAL SALPINGECTOMY Right 02/16/2018   Procedure: LAPAROSCOPIC RIGHT SALPINGECTOMY;  Surgeon: Megan Salon, MD;  Location: [facility];  Service: Gynecology;  Laterality: Right;   TUBAL LIGATION       Family History  Problem Relation Age of Onset   Cancer Mother        breast   Heart disease Mother    Stroke Mother    Diabetes Mother    Diabetes Father    Breast cancer Maternal Aunt    Colon cancer Neg Hx    Inflammatory bowel disease Neg Hx    Liver disease Neg Hx    Pancreatic cancer Neg Hx    Stomach cancer Neg Hx    Esophageal cancer Neg Hx      Social History   Substance and Sexual Activity  Drug Use No  ,  Social History   Substance and Sexual  Activity  Alcohol Use Yes   Comment: occ  ,  Social History   Tobacco Use  Smoking Status Former Smoker   Packs/day: 1.00   Years: 15.00   Pack years: 15.00   Types: Cigarettes   Quit date: 03/04/2004   Years since quitting: 14.9  Smokeless Tobacco Never Used  ,    Current Outpatient Medications on File Prior to Visit  Medication Sig Dispense Refill   buPROPion (WELLBUTRIN XL) 300 MG 24 hr tablet Take 1 tablet (300 mg total) by mouth daily. Needs office visit for refills (Patient taking differently: Take 300 mg by mouth daily. ) 30 tablet 0   FLUoxetine (PROZAC) 10 MG tablet Take 1 tablet (10 mg total) by mouth daily for 30 days. Needs office visit for refills 30 tablet 0   fluticasone (FLONASE) 50 MCG/ACT nasal spray 1 spray each nostril after sinus rinse twice daily (Patient taking differently: Place 1 spray into both nostrils daily. ) 16 g 2   ibuprofen (ADVIL) 200 MG tablet Take 400 mg by mouth every 6 (six) hours as needed for moderate pain.     metFORMIN (GLUCOPHAGE-XR) 500 MG 24 hr tablet TAKE 4 TABLETS BY MOUTH ONCE DAILY WITH BREAKFAST (Patient taking differently: Take 2,000 mg by mouth daily with breakfast. ) 360 tablet 1   pantoprazole  (PROTONIX) 40 MG tablet Take 1 tablet (40 mg total) by mouth daily. 30 tablet 0   Vitamin D, Ergocalciferol, (DRISDOL) 50000 units CAPS capsule Please take 1 tablet Wednesday and 1 tablet Sunday every week 24 capsule 3   No current facility-administered medications on file prior to visit.      Allergies  Allergen Reactions   Lantus [Insulin Glargine] Other (See Comments)    headaches     Review of Systems:   General:  Denies fever, chills Optho/Auditory:   Denies visual changes, blurred vision Respiratory:   Denies SOB, cough, wheeze, DIB  Cardiovascular:   Denies chest pain, palpitations, painful respirations Gastrointestinal:   Denies nausea, vomiting, diarrhea.  Endocrine:     Denies new hot or cold intolerance Musculoskeletal:  Denies joint swelling, gait issues, or new unexplained myalgias/ arthralgias Skin:  Denies rash, suspicious lesions  Neurological:    Denies dizziness, unexplained weakness, numbness  Psychiatric/Behavioral:   Denies mood changes    Objective:     Blood pressure 127/84, pulse 83, temperature 98.5 F (36.9 C), height 5\' 6"  (1.676 m), weight 174 lb 3.2 oz (79 kg), SpO2 98 %.  Body mass index is 28.12 kg/m.  General: Well Developed, well nourished, and in no acute distress.  HEENT: Normocephalic, atraumatic, pupils equal round reactive to light, neck supple, No carotid bruits, no JVD Skin: Warm and dry, cap RF less 2 sec Cardiac: Regular rate and rhythm, S1, S2 WNL's, no murmurs rubs or gallops Respiratory: ECTA B/L, Not using accessory muscles, speaking in full sentences. NeuroM-Sk: Ambulates w/o assistance, moves ext * 4 w/o difficulty, sensation grossly intact.  Ext: scant edema b/l lower ext Psych: No HI/SI, judgement and insight good, Euthymic mood. Full Affect.

## 2019-01-01 LAB — CBC WITH DIFFERENTIAL/PLATELET
Basophils Absolute: 0.1 10*3/uL (ref 0.0–0.2)
Basos: 1 %
EOS (ABSOLUTE): 0.2 10*3/uL (ref 0.0–0.4)
Eos: 2 %
Hematocrit: 44.4 % (ref 34.0–46.6)
Hemoglobin: 14.7 g/dL (ref 11.1–15.9)
Immature Grans (Abs): 0.1 10*3/uL (ref 0.0–0.1)
Immature Granulocytes: 1 %
Lymphocytes Absolute: 2.3 10*3/uL (ref 0.7–3.1)
Lymphs: 25 %
MCH: 28.8 pg (ref 26.6–33.0)
MCHC: 33.1 g/dL (ref 31.5–35.7)
MCV: 87 fL (ref 79–97)
Monocytes Absolute: 0.6 10*3/uL (ref 0.1–0.9)
Monocytes: 6 %
Neutrophils Absolute: 6 10*3/uL (ref 1.4–7.0)
Neutrophils: 65 %
Platelets: 326 10*3/uL (ref 150–450)
RBC: 5.1 x10E6/uL (ref 3.77–5.28)
RDW: 12.9 % (ref 11.7–15.4)
WBC: 9.2 10*3/uL (ref 3.4–10.8)

## 2019-01-01 LAB — COMPREHENSIVE METABOLIC PANEL
ALT: 23 IU/L (ref 0–32)
AST: 13 IU/L (ref 0–40)
Albumin/Globulin Ratio: 2.5 — ABNORMAL HIGH (ref 1.2–2.2)
Albumin: 4.5 g/dL (ref 3.8–4.8)
Alkaline Phosphatase: 72 IU/L (ref 39–117)
BUN/Creatinine Ratio: 22 (ref 9–23)
BUN: 10 mg/dL (ref 6–24)
Bilirubin Total: 0.6 mg/dL (ref 0.0–1.2)
CO2: 24 mmol/L (ref 20–29)
Calcium: 9.7 mg/dL (ref 8.7–10.2)
Chloride: 99 mmol/L (ref 96–106)
Creatinine, Ser: 0.46 mg/dL — ABNORMAL LOW (ref 0.57–1.00)
GFR calc Af Amer: 136 mL/min/{1.73_m2} (ref >59)
GFR calc non Af Amer: 118 mL/min/{1.73_m2} (ref >59)
Globulin, Total: 1.8 g/dL (ref 1.5–4.5)
Glucose: 170 mg/dL — ABNORMAL HIGH (ref 65–99)
Potassium: 4.5 mmol/L (ref 3.5–5.2)
Sodium: 141 mmol/L (ref 134–144)
Total Protein: 6.3 g/dL (ref 6.0–8.5)

## 2019-01-20 ENCOUNTER — Encounter: Payer: Self-pay | Admitting: Family Medicine

## 2019-02-04 ENCOUNTER — Ambulatory Visit (INDEPENDENT_AMBULATORY_CARE_PROVIDER_SITE_OTHER): Payer: BC Managed Care – PPO | Admitting: Family Medicine

## 2019-02-04 ENCOUNTER — Other Ambulatory Visit: Payer: Self-pay

## 2019-02-04 ENCOUNTER — Encounter: Payer: Self-pay | Admitting: Family Medicine

## 2019-02-04 VITALS — BP 112/75 | HR 95 | Temp 98.9°F | Ht 66.0 in | Wt 175.3 lb

## 2019-02-04 DIAGNOSIS — F331 Major depressive disorder, recurrent, moderate: Secondary | ICD-10-CM | POA: Diagnosis not present

## 2019-02-04 DIAGNOSIS — R12 Heartburn: Secondary | ICD-10-CM

## 2019-02-04 DIAGNOSIS — E1165 Type 2 diabetes mellitus with hyperglycemia: Secondary | ICD-10-CM

## 2019-02-04 DIAGNOSIS — F5104 Psychophysiologic insomnia: Secondary | ICD-10-CM | POA: Diagnosis not present

## 2019-02-04 LAB — POCT GLYCOSYLATED HEMOGLOBIN (HGB A1C): Hemoglobin A1C: 8.4 % — AB (ref 4.0–5.6)

## 2019-02-04 LAB — POCT UA - MICROALBUMIN
Albumin/Creatinine Ratio, Urine, POC: <30
Creatinine, POC: 200 mg/dL
Microalbumin Ur, POC: 10 mg/L

## 2019-02-04 MED ORDER — FLUOXETINE HCL 20 MG PO TABS: 20.0000 mg | ORAL_TABLET | Freq: Every day | ORAL | 1 refills | Status: DC

## 2019-02-04 MED ORDER — BUPROPION HCL ER (XL) 300 MG PO TB24: 300.0000 mg | ORAL_TABLET | Freq: Every day | ORAL | 1 refills | Status: DC

## 2019-02-04 MED ORDER — DEXCOM G5 RECEIVER KIT DEVI: 1.0000 | Freq: Two times a day (BID) | 0 refills | Status: DC

## 2019-02-04 MED ORDER — PANTOPRAZOLE SODIUM 20 MG PO TBEC: 20.0000 mg | DELAYED_RELEASE_TABLET | Freq: Every day | ORAL | 0 refills | Status: DC

## 2019-02-04 MED ORDER — METFORMIN HCL ER 500 MG PO TB24: 2000.0000 mg | ORAL_TABLET | Freq: Every day | ORAL | 1 refills | Status: DC

## 2019-02-04 MED ORDER — DEXCOM G6 SENSOR MISC: 1.0000 | Freq: Two times a day (BID) | 2 refills | Status: DC

## 2019-02-04 NOTE — Progress Notes (Signed)
Impression and Recommendations:    1. Uncontrolled type 2 diabetes mellitus with hyperglycemia, without long-term current use of insulin ([place])   2. Moderate episode of recurrent major depressive disorder (HCC)   3. Heartburn   4. Psychophysiological insomnia      Uncontrolled type 2 diabetes mellitus with hyperglycemia, without long-term current use of insulin ([place]) - Plan: POCT glycosylated hemoglobin (Hb A1C), POCT UA - Microalbumin, Continuous Blood Gluc Receiver (DEXCOM G5 RECEIVER KIT) DEVI, Continuous Blood Gluc Sensor (DEXCOM G6 SENSOR) MISC, metFORMIN (GLUCOPHAGE-XR) 500 MG 24 hr tablet  Moderate episode of recurrent major depressive disorder ([place]) - Plan: buPROPion (WELLBUTRIN XL) 300 MG 24 hr tablet, FLUoxetine (PROZAC) 20 MG tablet  Heartburn - Plan: pantoprazole (PROTONIX) 20 MG tablet  Psychophysiological insomnia  - Prudent practices during COVID reviewed with patient today, 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  Advised patient to "pretend that everyone has it," wearing masks and face shields, having a 6 foot space lined out around her desk in tape, etc.  Heartburn - Stable at this time. - Reviewed medications. - Advised patient to reduce to half a tablet of Protonix. - Avoid trigger foods if at all possible.  - Will continue to monitor.  Moderate Episode of Recurrent MDD - Stable at this time.  - Reviewed medications. - Continue treatment plan as prescribed. - Refills provided today. - will continue to monitor.  Poorly controlled Insulin-Dependent Diabetes Mellitus - A1c down to 8.4 from last check 12/25/2018, when A1c was 11.3. - Continues metformin as prescribed.  See med list. - Reviewed medications and refills provided today.  - Started on insulin last appointment 12/31/2018. - Instructions: NovoLog 5 units with each meal, plus sliding scale.  Advised patient regarding appropriate dosage.  - Per patient, insurance will pay  NovoLog but not Humalog.  Patient managed on Levemir and is buying "regular insulin OTC."  - Patient is taking 20 units Levemir twice per day.  - Per patient, denies having lows when she doesn't eat.   - Discussed causes of low blood sugar with patient during visit today.  Questions answered. - Advised patient to consume protein and fiber with meals, instead of too many carbs. - Patient knows to keep something next to her bed for emergency low blood sugar.  - Advised patient of the CRITICAL importance of avoiding low blood sugars. - Discussed appropriate combinations of long-acting and short-acting insulin.  - Per pt, has been to a nutritionist in the past.  Declines referral today. - Patient knows she may ask for referral to nutrition again PRN as desired.  - Continue management as prescribed.  See med list. - Patient knows she may have a referral to endocrinology at any time if preferred.  - Counseled patient on pathophysiology of disease and discussed various treatment options, which often includes dietary and lifestyle modifications as first line.  Importance of low carb diet discussed with patient in addition to regular exercise.   - Check FBS and 2 hours after the biggest meal of your day.  Keep log and bring in next OV for my review.   Also, if you ever feel poorly, please check your blood pressure and blood sugar, as one or the other could be the cause of your symptoms.  - Being a diabetic, you need yearly eye and foot exams. Make appt.for diabetic eye exam.  H/o elevated BP ( no diagnosis HTN)  Associated with Diabetes - Blood pressure stable  at this time. - Continue treatment plan as prescribed.  See med list. - Ongoing ambulatory blood pressure monitoring encouraged. - Will continue to monitor.  BMI Counseling - BMI of 28.29 Explained to patient what BMI refers to, and what it means medically.  Told patient to think about it as a "medical risk stratification measurement"  and how increasing BMI is associated with increasing risk/ or worsening state of various diseases such as hypertension, hyperlipidemia, diabetes, premature OA, depression etc.  American Heart Association guidelines for healthy diet, basically Mediterranean diet, and exercise guidelines of 30 minutes 5 days per week or more discussed in detail.  Health counseling performed.  All questions answered.  - If patient wishes for support for weight loss or referral to Weight and Wellness, she knows to ask. - As a former patient, she prefers to go to [facility] again unless it's out of her insurance network.  Lifestyle & Preventative Health Maintenance - Advised patient to continue working toward exercising to improve overall mental, physical, and emotional health.    - Healthy dietary habits encouraged, including low-carb, and high amounts of lean protein in diet.   - Patient should also consume adequate amounts of water.  Recommendations - Return in 3 months for diabetes follow-up.   Education and routine counseling performed. Handouts provided.  Orders Placed This Encounter  Procedures   POCT glycosylated hemoglobin (Hb A1C)   POCT UA - Microalbumin    Medications Discontinued During This Encounter  Medication Reason   Semaglutide, 1 MG/DOSE, (OZEMPIC, 1 MG/DOSE,) 2 MG/1.5ML SOPN Cost of medication   acetaminophen (TYLENOL) 325 MG tablet Patient Preference   phenol (CHLORASEPTIC WARM SORE THROAT) 1.4 % LIQD No longer needed (for PRN medications)   traMADol (ULTRAM) 50 MG tablet Completed Course   insulin lispro (HUMALOG) 100 UNIT/ML injection    metFORMIN (GLUCOPHAGE-XR) 500 MG 24 hr tablet Reorder   buPROPion (WELLBUTRIN XL) 300 MG 24 hr tablet Reorder   pantoprazole (PROTONIX) 40 MG tablet    FLUoxetine (PROZAC) 10 MG tablet Reorder      Meds ordered this encounter  Medications   Continuous Blood Gluc Receiver (DEXCOM G5 RECEIVER KIT) DEVI    Sig: 1 kit by Does  not apply route 2 (two) times daily.    Dispense:  1 Device    Refill:  0   Continuous Blood Gluc Sensor (DEXCOM G6 SENSOR) MISC    Sig: 1 Device by Does not apply route 2 (two) times daily.    Dispense:  3 each    Refill:  2   pantoprazole (PROTONIX) 20 MG tablet    Sig: Take 1 tablet (20 mg total) by mouth daily.    Dispense:  90 tablet    Refill:  0   metFORMIN (GLUCOPHAGE-XR) 500 MG 24 hr tablet    Sig: Take 4 tablets (2,000 mg total) by mouth daily with breakfast.    Dispense:  360 tablet    Refill:  1   buPROPion (WELLBUTRIN XL) 300 MG 24 hr tablet    Sig: Take 1 tablet (300 mg total) by mouth daily. Needs office visit for refills    Dispense:  90 tablet    Refill:  1   FLUoxetine (PROZAC) 20 MG tablet    Sig: Take 1 tablet (20 mg total) by mouth daily.    Dispense:  90 tablet    Refill:  1    The patient was counseled, risk factors were discussed, anticipatory guidance given.  Johney Maine  side effects, risk and benefits, and alternatives of medications discussed with patient.  Patient is aware that all medications have potential side effects and we are unable to predict every side effect or drug-drug interaction that may occur.  Expresses verbal understanding and consents to current therapy plan and treatment regimen.   Return for diabetes follow up in 3 mo.   Please see AVS handed out to patient at the end of our visit for further patient instructions/ counseling done pertaining to today's office visit.    Note:  This document was prepared using Dragon voice recognition software and may include unintentional dictation errors.   This document serves as a record of services personally performed by Mellody Dance, DO. It was created on her behalf by Toni Amend, a trained medical scribe. The creation of this record is based on the scribe's personal observations and the provider's statements to them.   I have reviewed the above medical documentation for accuracy and  completeness and I concur.  Mellody Dance, DO 02/05/2019 4:10 PM        Subjective:    Chief Complaint  Patient presents with   Follow-up     Lindsey Mason is a 48 y.o. female who presents to [facility] today for Diabetes Management.    Had a wisdom tooth cut out a week ago Monday.  She was sedated and put on steroids for this.  Says she's so tired.  Use of Protonix for GERD Has never had upper GI scope, does not have esophagitis, has heartburn now and then.  Weight Management Patient's weight has remained stable around 175-176 over the past several months.  Went to the [facility] and is interested in seeking support again.  Says "I like being followed and being held accountable."  States she has worked so hard to get down from 213 lbs, she doesn't want to gain the weight back.  DM HPI: -  She has been working on diet and exercise for diabetes.  "Trying her best to stay cognizant."  Pt is currently maintained on the following medications for diabetes:   see med list today Medication compliance - says the insulin shots are going fine.  Says "I feel like I'm just chasing the highs."  She's checking herself two hours postprandial and then giving herself the insulin.  Patient says "I think we should do five units with meals.  Usually what I have to give is 5-7 units."  Dosing was not hard for her to figure out.  Home glucose readings range - her fasting sugars are around 105, 106, 87, 114, 113, 97 this morning.    Had one low sugar of 57 at 6:08 AM.  When she experienced the low blood sugar episode, patient notes she "sat straight up in bed because she was clammy and sweaty and shaky."  Says that her blood sugar was 240 before she went to bed, and wonders if she did too much insulin that night.  Says her sugars are getting better and better, "not quite where they need to be yet."  Has been checking sugars three times per  day.   Denies polyuria/polydipsia. Denies hypo/ hyperglycemia symptoms - She denies new onset of: chest pain, exercise intolerance, shortness of breath, dizziness, visual changes, headache, lower extremity swelling or claudication.   Last diabetic eye exam was  Lab Results  Component Value Date   HMDIABEYEEXA No Retinopathy 12/10/2018    Foot exam-  UTD  Last A1C in the office was:  Lab Results  Component Value Date   HGBA1C 8.4 (A) 02/04/2019   HGBA1C 11.3 (H) 12/25/2018   HGBA1C 5.9 01/27/2018    Lab Results  Component Value Date   MICROALBUR 10 02/04/2019   LDLCALC 81 08/14/2017   CREATININE 0.46 (L) 12/31/2018    1. HTN HPI:  -  Her blood pressure has been controlled at home.  Pt is checking it at home.   Usually 112/80, always under 120/80.  - Patient reports good compliance with blood pressure medications  - Denies medication S-E   - Smoking Status noted   - She denies new onset of: chest pain, exercise intolerance, shortness of breath, dizziness, visual changes, headache, lower extremity swelling or claudication.   Last 3 blood pressure readings in our office are as follows: BP Readings from Last 3 Encounters:  02/04/19 112/75  12/31/18 127/84  12/26/18 122/70    Filed Weights   02/04/19 1400  Weight: 175 lb 4.8 oz (79.5 kg)        Last 3 blood pressure readings in our office are as follows: BP Readings from Last 3 Encounters:  02/04/19 112/75  12/31/18 127/84  12/26/18 122/70    BMI Readings from Last 3 Encounters:  02/04/19 28.29 kg/m  12/31/18 28.12 kg/m  12/26/18 28.08 kg/m    Patient Care Team    Relationship Specialty Notifications Start End  Mellody Dance, DO PCP - General Family Medicine  08/14/17   Marin Comment, My [place], [place] Referring Physician Optometry  08/04/17   [facility]    02/16/19      Patient Active Problem List   Diagnosis Date Noted   Major depressive disorder, recurrent episode ([place]) 08/20/2010     Priority: High   Hyperlipidemia associated with type 2 diabetes mellitus ([place]) 04/11/2009    Priority: High   Poorly controlled type 2 diabetes mellitus ([place])- on insulin now for mgt 03/08/2009    Priority: High   Vitamin D deficiency 08/26/2017    Priority: Medium   Nausea and vomiting 12/24/2018   Nausea & vomiting 12/24/2018   Generalized abdominal pain 12/24/2018   Statin declined 05/05/2018   Bloating 03/10/2018   Alternating constipation and diarrhea 03/10/2018   Pyrosis 03/10/2018   Abnormal liver ultrasound 03/10/2018   Calculus of gallbladder without cholecystitis without obstruction 03/10/2018   h/o Enlarged ovaries 11/20/2017   Environmental and seasonal allergies 11/04/2017   Psychophysiological insomnia 11/04/2017   Depression 09/03/2016   Overweight (BMI 25.0-29.9) 09/03/2016   Witnessed episode of apnea 09/03/2016   Urinary frequency 04/03/2016   Acute maxillary sinusitis 11/04/2014   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   PCOS (polycystic ovarian syndrome) 05/27/2012   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   ACNE ROSACEA 04/11/2009   GERD 03/08/2009     Past Medical History:  Diagnosis Date   Clomid pregnancy 1997, 2005   Depression    Diabetes mellitus without mention of complication    Esophageal reflux    Headache(784.0)    Infertility, female    PCOS - Clomid pregnancies    Ovarian cyst 2019   PCOS (polycystic ovarian syndrome) 05/27/2012   PONV (postoperative nausea and vomiting)    nausea   Pure hypercholesterolemia    Rosacea    Status post appendectomy 11/19/2017     Past Surgical History:  Procedure Laterality Date   APPENDECTOMY     CESAREAN SECTION     x 3, last one  with triplets   CHOLECYSTECTOMY N/A 12/25/2018   Procedure: LAPAROSCOPIC CHOLECYSTECTOMY WITH INTRAOPERATIVE CHOLANGIOGRAM;  Surgeon: Jovita Kussmaul, MD;  Location: [facility];  Service: General;  Laterality: N/A;   LAPAROSCOPIC  BILATERAL SALPINGECTOMY Right 02/16/2018   Procedure: LAPAROSCOPIC RIGHT SALPINGECTOMY;  Surgeon: Megan Salon, MD;  Location: [facility];  Service: Gynecology;  Laterality: Right;   TUBAL LIGATION       Family History  Problem Relation Age of Onset   Cancer Mother        breast   Heart disease Mother    Stroke Mother    Diabetes Mother    Diabetes Father    Breast cancer Maternal Aunt    Colon cancer Neg Hx    Inflammatory bowel disease Neg Hx    Liver disease Neg Hx    Pancreatic cancer Neg Hx    Stomach cancer Neg Hx    Esophageal cancer Neg Hx      Social History   Substance and Sexual Activity  Drug Use No  ,  Social History   Substance and Sexual Activity  Alcohol Use Yes   Comment: occ  ,  Social History   Tobacco Use  Smoking Status Former Smoker   Packs/day: 1.00   Years: 15.00   Pack years: 15.00   Types: Cigarettes   Quit date: 03/04/2004   Years since quitting: 14.9  Smokeless Tobacco Never Used  ,    Current Outpatient Medications on File Prior to Visit  Medication Sig Dispense Refill   blood glucose meter kit and supplies Dispense based on patient and insurance preference. Use in the morning when fasting and 2 hours after largest meal. (FOR ICD-10 E10.9, E11.9). 1 each 0   fluticasone (FLONASE) 50 MCG/ACT nasal spray 1 spray each nostril after sinus rinse twice daily (Patient taking differently: Place 1 spray into both nostrils daily. ) 16 g 2   ibuprofen (ADVIL) 200 MG tablet Take 400 mg by mouth every 6 (six) hours as needed for moderate pain.     insulin detemir (LEVEMIR) 100 UNIT/ML injection 20 units q 12 hrs 10 mL 1   insulin regular (NOVOLIN R) 100 units/mL injection Inject into the skin 3 (three) times daily before meals. 5 units with each meal plus Sliding Scale      Vitamin D, Ergocalciferol, (DRISDOL) 50000 units CAPS capsule Please take 1 tablet Wednesday and 1 tablet Sunday every week 24 capsule  3   No current facility-administered medications on file prior to visit.      Allergies  Allergen Reactions   Lantus [Insulin Glargine] Other (See Comments)    headaches     Review of Systems:   General:  Denies fever, chills Optho/Auditory:   Denies visual changes, blurred vision Respiratory:   Denies SOB, cough, wheeze, DIB  Cardiovascular:   Denies chest pain, palpitations, painful respirations Gastrointestinal:   Denies nausea, vomiting, diarrhea.  Endocrine:     Denies new hot or cold intolerance Musculoskeletal:  Denies joint swelling, gait issues, or new unexplained myalgias/ arthralgias Skin:  Denies rash, suspicious lesions  Neurological:    Denies dizziness, unexplained weakness, numbness  Psychiatric/Behavioral:   Denies mood changes    Objective:     Blood pressure 112/75, pulse 95, temperature 98.9 F (37.2 C), height '5\' 6"'  (1.676 m), weight 175 lb 4.8 oz (79.5 kg), SpO2 97 %.  Body mass index is 28.29 kg/m.  General: Well Developed, well nourished, and in no acute distress.  HEENT: Normocephalic, atraumatic, pupils equal round reactive to light, neck supple, No carotid bruits, no JVD Skin: Warm and dry, cap RF less 2 sec Cardiac: Regular rate and rhythm, S1, S2 WNL's, no murmurs rubs or gallops Respiratory: ECTA B/L, Not using accessory muscles, speaking in full sentences. NeuroM-Sk: Ambulates w/o assistance, moves ext * 4 w/o difficulty, sensation grossly intact.  Ext: scant edema b/l lower ext Psych: No HI/SI, judgement and insight good, Euthymic mood. Full Affect.

## 2019-02-04 NOTE — Patient Instructions (Signed)

## 2019-02-16 ENCOUNTER — Encounter: Payer: Self-pay | Admitting: Family Medicine

## 2019-02-17 ENCOUNTER — Encounter: Payer: Self-pay | Admitting: Family Medicine

## 2019-02-22 ENCOUNTER — Telehealth: Payer: Self-pay

## 2019-02-22 ENCOUNTER — Other Ambulatory Visit: Payer: Self-pay | Admitting: Orthopedic Surgery

## 2019-02-22 MED ORDER — INSULIN ASPART 100 UNIT/ML FLEXPEN: 5.0000 [IU] | PEN_INJECTOR | Freq: Three times a day (TID) | SUBCUTANEOUS | 0 refills | Status: DC

## 2019-02-22 NOTE — Telephone Encounter (Signed)
MyChart message received from pt requesting Humalog be changed to [employer] for insurance coverage.  Per Dr. Raliegh Scarlet, RX sent to pharmacy for [facility].  Charyl Bigger, CMA

## 2019-02-27 ENCOUNTER — Other Ambulatory Visit: Payer: Self-pay | Admitting: Family Medicine

## 2019-02-27 DIAGNOSIS — E1165 Type 2 diabetes mellitus with hyperglycemia: Secondary | ICD-10-CM

## 2019-03-04 ENCOUNTER — Telehealth: Payer: Self-pay | Admitting: Family Medicine

## 2019-03-04 NOTE — Telephone Encounter (Signed)
[employer] called, needing patient Lindsey Mason)'s number of insulin injections per day and Blood Glucose test per day, also said it could be faxed. Form was sent to [place], will put in basket for review.  [place] Phone #:[phone] Audrie Lia # : [phone]   Patient account number: [id number]  --- forwarding to clinical pool --- AM

## 2019-03-04 NOTE — Telephone Encounter (Signed)
Filled out paperwork and faxed back to [place] @ 4:01pm.

## 2019-03-04 NOTE — Telephone Encounter (Signed)
[employer] called, needing patient Lindsey Mason)'s number of insulin injections per day and Blood Glucose test per day, also said it could be faxed. Form was sent to [place], will put in basket for review.  [place] Phone #:[phone] Audrie Lia # : [phone]   Patient account number: [id number]  --- forwarding to clinical pool --- AM

## 2019-03-19 ENCOUNTER — Encounter (HOSPITAL_BASED_OUTPATIENT_CLINIC_OR_DEPARTMENT_OTHER): Admission: RE | Payer: Self-pay | Source: Home / Self Care

## 2019-03-19 ENCOUNTER — Ambulatory Visit (HOSPITAL_BASED_OUTPATIENT_CLINIC_OR_DEPARTMENT_OTHER)
Admission: RE | Admit: 2019-03-19 | Payer: BC Managed Care – PPO | Source: Home / Self Care | Admitting: Orthopedic Surgery

## 2019-03-19 SURGERY — FASCIECTOMY, PALM
Anesthesia: Regional | Laterality: Left

## 2019-03-20 ENCOUNTER — Other Ambulatory Visit: Payer: Self-pay | Admitting: Family Medicine

## 2019-03-26 ENCOUNTER — Ambulatory Visit (HOSPITAL_BASED_OUTPATIENT_CLINIC_OR_DEPARTMENT_OTHER): Admit: 2019-03-26 | Payer: BC Managed Care – PPO | Admitting: Orthopedic Surgery

## 2019-03-26 ENCOUNTER — Encounter (HOSPITAL_BASED_OUTPATIENT_CLINIC_OR_DEPARTMENT_OTHER): Payer: Self-pay

## 2019-03-26 SURGERY — FASCIECTOMY, PALM
Anesthesia: Regional | Laterality: Left

## 2019-04-09 ENCOUNTER — Other Ambulatory Visit: Payer: Self-pay | Admitting: Family Medicine

## 2019-04-09 DIAGNOSIS — E1165 Type 2 diabetes mellitus with hyperglycemia: Secondary | ICD-10-CM

## 2019-04-28 ENCOUNTER — Other Ambulatory Visit: Payer: Self-pay

## 2019-04-28 DIAGNOSIS — Z20822 Contact with and (suspected) exposure to covid-19: Secondary | ICD-10-CM

## 2019-04-30 LAB — NOVEL CORONAVIRUS, NAA: SARS-CoV-2, NAA: NOT DETECTED

## 2019-05-04 ENCOUNTER — Other Ambulatory Visit: Payer: Self-pay

## 2019-05-04 ENCOUNTER — Ambulatory Visit (INDEPENDENT_AMBULATORY_CARE_PROVIDER_SITE_OTHER): Payer: BC Managed Care – PPO | Admitting: Family Medicine

## 2019-05-04 ENCOUNTER — Encounter: Payer: Self-pay | Admitting: Family Medicine

## 2019-05-04 VITALS — BP 129/83 | HR 94 | Temp 98.5°F | Resp 10 | Ht 67.0 in | Wt 194.2 lb

## 2019-05-04 DIAGNOSIS — F331 Major depressive disorder, recurrent, moderate: Secondary | ICD-10-CM

## 2019-05-04 DIAGNOSIS — Z23 Encounter for immunization: Secondary | ICD-10-CM | POA: Diagnosis not present

## 2019-05-04 DIAGNOSIS — E1165 Type 2 diabetes mellitus with hyperglycemia: Secondary | ICD-10-CM

## 2019-05-04 DIAGNOSIS — E1169 Type 2 diabetes mellitus with other specified complication: Secondary | ICD-10-CM

## 2019-05-04 DIAGNOSIS — E6609 Other obesity due to excess calories: Secondary | ICD-10-CM

## 2019-05-04 DIAGNOSIS — E559 Vitamin D deficiency, unspecified: Secondary | ICD-10-CM

## 2019-05-04 DIAGNOSIS — E785 Hyperlipidemia, unspecified: Secondary | ICD-10-CM

## 2019-05-04 LAB — POCT GLYCOSYLATED HEMOGLOBIN (HGB A1C): Hemoglobin A1C: 6.2 % — AB (ref 4.0–5.6)

## 2019-05-04 NOTE — Patient Instructions (Signed)
20 min 4 d /week.         Behavior Modification Ideas for Weight Management  Weight management involves adopting a healthy lifestyle that includes a knowledge of nutrition and exercise, a positive attitude and the right kind of motivation. Internal motives such as better health, increased energy, self-esteem and personal control increase your chances of lifelong weight management success.  Remember to have realistic goals and think long-term success. Believe in yourself and you can do it. The following information will give you ideas to help you meet your goals.  Control Your Home Environment  Eat only while sitting down at the kitchen or dining room table. Do not eat while watching television, reading, cooking, talking on the phone, standing at the refrigerator or working on the computer. Keep tempting foods out of the house - don't buy them. Keep tempting foods out of sight. Have low-calorie foods ready to eat. Unless you are preparing a meal, stay out of the kitchen. Have healthy snacks at your disposal, such as small pieces of fruit, vegetables, canned fruit, pretzels, low-fat string cheese and nonfat cottage cheese.  Control Your Work Environment  Do not eat at [employer] or keep tempting snacks at your desk. If you get hungry between meals, plan healthy snacks and bring them with you to work. During your breaks, go for a walk instead of eating. If you work around food, plan in advance the one item you will eat at mealtime. Make it inconvenient to nibble on food by chewing gum, sugarless candy or drinking water or another low-calorie beverage. Do not work through meals. Skipping meals slows down metabolism and may result in overeating at the next meal. If food is available for special occasions, either pick the healthiest item, nibble on low-fat snacks brought from home, don't have anything offered, choose one option and have a small amount, or have only a beverage.  Control Your  Mealtime Environment  Serve your plate of food at the stove or kitchen counter. Do not put the serving dishes on the table. If you do put dishes on the table, remove them immediately when finished eating. Fill half of your plate with vegetables, a quarter with lean protein and a quarter with starch. Use smaller plates, bowls and glasses. A smaller portion will look large when it is in a little dish. Politely refuse second helpings. When fixing your plate, limit portions of food to one scoop/serving or less.   Daily Food Management  Replace eating with another activity that you will not associate with food. Wait 20 minutes before eating something you are craving. Drink a large glass of water or diet soda before eating. Always have a big glass or bottle of water to drink throughout the day. Avoid high-calorie add-ons such as cream with your coffee, butter, mayonnaise and salad dressings.  Shopping: Do not shop when hungry or tired. Shop from a list and avoid buying anything that is not on your list. If you must have tempting foods, buy individual-sized packages and try to find a lower-calorie alternative. Don't taste test in the store. Read food labels. Compare products to help you make the healthiest choices.  Preparation: Chew a piece of gum while cooking meals. Use a quarter teaspoon if you taste test your food. Try to only fix what you are going to eat, leaving yourself no chance for seconds. If you have prepared more food than you need, portion it into individual containers and freeze or refrigerate immediately. Don't snack  while cooking meals.  Eating: Eat slowly. Remember it takes about 20 minutes for your stomach to send a message to your brain that it is full. Don't let fake hunger make you think you need more. The ideal way to eat is to take a bite, put your utensil down, take a sip of water, cut your next bite, take a bit, put your utensil down and so on. Do not cut your  food all at one time. Cut only as needed. Take small bites and chew your food well. Stop eating for a minute or two at least once during a meal or snack. Take breaks to reflect and have conversation.  Cleanup and Leftovers: Label leftovers for a specific meal or snack. Freeze or refrigerate individual portions of leftovers. Do not clean up if you are still hungry.  Eating Out and Social Eating  Do not arrive hungry. Eat something light before the meal. Try to fill up on low-calorie foods, such as vegetables and fruit, and eat smaller portions of the high-calorie foods. Eat foods that you like, but choose small portions. If you want seconds, wait at least 20 minutes after you have eaten to see if you are actually hungry or if your eyes are bigger than your stomach. Limit alcoholic beverages. Try a soda water with a twist of lime. Do not skip other meals in the day to save room for the special event.  At Restaurants: Order  la carte rather than buffet style. Order some vegetables or a salad for an appetizer instead of eating bread. If you order a high-calorie dish, share it with someone. Try an after-dinner mint with your coffee. If you do have dessert, share it with two or more people. Don't overeat because you do not want to waste food. Ask for a doggie bag to take extra food home. Tell the server to put half of your entree in a to go bag before the meal is served to you. Ask for salad dressing, gravy or high-fat sauces on the side. Dip the tip of your fork in the dressing before each bite. If bread is served, ask for only one piece. Try it plain without butter or oil. At [employer] where oil and vinegar is served with bread, use only a small amount of oil and a lot of vinegar for dipping.  At a Friend[facility] House: Offer to bring a dish, appetizer or dessert that is low in calories. Serve yourself small portions or tell the host that you only want a small amount. Stand or sit  away from the snack table. Stay away from the kitchen or stay busy if you are near the food. Limit your alcohol intake.  At [employer] and Cafeterias: Cover most of your plate with lettuce and/or vegetables. Use a salad plate instead of a dinner plate. After eating, clear away your dishes before having coffee or tea.  Entertaining at Home: Explore low-fat, low-cholesterol cookbooks. Use single-serving foods like chicken breasts or hamburger patties. Prepare low-calorie appetizers and desserts.   Holidays: Keep tempting foods out of sight. Decorate the house without using food. Have low-calorie beverages and foods on hand for guests. Allow yourself one planned treat a day. Don't skip meals to save up for the holiday feast. Eat regular, planned meals.   Exercise Well  Make exercise a priority and a planned activity in the day. If possible, walk the entire or part of the distance to work. Get an exercise buddy. Go for a walk with  a colleague during one of your breaks, go to the gym, run or take a walk with a friend, walk in the mall with a shopping companion. Park at the end of the parking lot and walk to the store or office entrance. Always take the stairs all of the way or at least part of the way to your floor. If you have a desk job, walk around the office frequently. Do leg lifts while sitting at your desk. Do something outside on the weekends like going for a hike or a bike ride.   Have a Healthy Attitude  Make health your weight management priority. Be realistic. Have a goal to achieve a healthier you, not necessarily the lowest weight or ideal weight based on calculations or tables. Focus on a healthy eating style, not on dieting. Dieting usually lasts for a short amount of time and rarely produces long-term success. Think long term. You are developing new healthy behaviors to follow next month, in a year and in a decade.    This information is for educational purposes  only and is not intended to replace the advice of your doctor or health care provider. We encourage you to discuss with your doctor any questions or concerns you may have.        Guidelines for Losing Weight   We want weight loss that will last so you should lose 1-2 pounds a week.  THAT IS IT! Please pick THREE things a month to change. Once it is a habit check off the item. Then pick another three items off the list to become habits.  If you are already doing a habit on the list GREAT!  Cross that item off!  Don't drink your calories. Ie, alcohol, soda, fruit juice, and sweet tea.   Drink more water. Drink a glass when you feel hungry or before each meal.   Eat breakfast - Complex carb and protein (likeDannon light and fit yogurt, oatmeal, fruit, eggs, [place] bacon).  Measure your cereal.  Eat no more than one cup a day. (ie Kashi)  Eat an apple a day.  Add a vegetable a day.  Try a new vegetable a month.  Use Pam! Stop using oil or butter to cook.  Don't finish your plate or use smaller plates.  Share your dessert.  Eat sugar free Jello for dessert or frozen grapes.  Don't eat 2-3 hours before bed.  Switch to whole wheat bread, pasta, and brown rice.  Make healthier choices when you eat out. No fries!  Pick baked chicken, NOT fried.  Don't forget to SLOW DOWN when you eat. It is not going anywhere.   Take the stairs.  Park far away in the parking lot  Lift soup cans (or weights) for 10 minutes while watching TV.  Walk at work for 10 minutes during break.  Walk outside 1 time a week with your friend, kids, dog, or significant other.  Start a walking group at church.  Walk the mall as much as you can tolerate.   Keep a food diary.  Weigh yourself daily.  Walk for 15 minutes 3 days per week.  Cook at home more often and eat out less. If life happens and you go back to old habits, it is okay.  Just start over. You can do it!  If you experience chest  pain, get short of breath, or tired during the exercise, please stop immediately and inform your doctor.    Before you even begin  to attack a weight-loss plan, it pays to remember this: You are not fat. You have fat. Losing weight isn't about blame or shame; it's simply another achievement to accomplish. Dieting is like any other skill-you have to buckle down and work at it. As long as you act in a smart, reasonable way, you'll ultimately get where you want to be. Here are some weight loss pearls for you.   1. It's Not a Diet. It's a Lifestyle Thinking of a diet as something you're on and suffering through only for the short term doesn't work. To shed weight and keep it off, you need to make permanent changes to the way you eat. It's OK to indulge occasionally, of course, but if you cut calories temporarily and then revert to your old way of eating, you'll gain back the weight quicker than you can say yo-yo. Use it to lose it. Research shows that one of the best predictors of long-term weight loss is how many pounds you drop in the first month. For that reason, nutritionists often suggest being stricter for the first two weeks of your new eating strategy to build momentum. Cut out added sugar and alcohol and avoid unrefined carbs. After that, figure out how you can reincorporate them in a way that's healthy and maintainable.  2. There's a Right Way to Exercise Working out burns calories and fat and boosts your metabolism by building muscle. But those trying to lose weight are notorious for overestimating the number of calories they burn and underestimating the amount they take in. Unfortunately, your system is biologically programmed to hold on to extra pounds and that means when you start exercising, your body senses the deficit and ramps up its hunger signals. If you're not diligent, you'll eat everything you burn and then some. Use it, to lose it. Cardio gets all the exercise glory, but strength and  interval training are the real heroes. They help you build lean muscle, which in turn increases your metabolism and calorie-burning ability 3. Don't Overreact to Mild Hunger Some people have a hard time losing weight because of hunger anxiety. To them, being hungry is bad-something to be avoided at all costs-so they carry snacks with them and eat when they don't need to. Others eat because they're stressed out or bored. While you never want to get to the point of being ravenous (that's when bingeing is likely to happen), a hunger pang, a craving, or the fact that it's 3:00 p.m. should not send you racing for the vending machine or obsessing about the energy bar in your purse. Ideally, you should put off eating until your stomach is growling and it's difficult to concentrate.  Use it to lose it. When you feel the urge to eat, use the HALT method. Ask yourself, Am I really hungry? Or am I angry or anxious, lonely or bored, or tired? If you're still not certain, try the apple test. If you're truly hungry, an apple should seem delicious; if it doesn't, something else is going on. Or you can try drinking water and making yourself busy, if you are still hungry try a healthy snack.  4. Not All Calories Are Created Equal The mechanics of weight loss are pretty simple: Take in fewer calories than you use for energy. But the kind of food you eat makes all the difference. Processed food that's high in saturated fat and refined starch or sugar can cause inflammation that disrupts the hormone signals that tell your brain you're  full. The result: You eat a lot more.  Use it to lose it. Clean up your diet. Swap in whole, unprocessed foods, including vegetables, lean protein, and healthy fats that will fill you up and give you the biggest nutritional bang for your calorie buck. In a few weeks, as your brain starts receiving regular hunger and fullness signals once again, you'll notice that you feel less hungry overall and  naturally start cutting back on the amount you eat.  5. Protein, Produce, and Plant-Based Fats Are Your Weight-Loss Trinity Here's why eating the three Ps regularly will help you drop pounds. Protein fills you up. You need it to build lean muscle, which keeps your metabolism humming so that you can torch more fat. People in a weight-loss program who ate double the recommended daily allowance for protein (about 110 grams for a 150-pound woman) lost 70 percent of their weight from fat, while people who ate the RDA lost only about 40 percent, one study found. Produce is packed with filling fiber. "It's very difficult to consume too many calories if you're eating a lot of vegetables. Example: Three cups of broccoli is a lot of food, yet only 93 calories. (Fruit is another story. It can be easy to overeat and can contain a lot of calories from sugar, so be sure to monitor your intake.) Plant-based fats like olive oil and those in avocados and nuts are healthy and extra satiating.  Use it to lose it. Aim to incorporate each of the three Ps into every meal and snack. People who eat protein throughout the day are able to keep weight off, according to a study in the [place] of Clinical Nutrition. In addition to meat, poultry and seafood, good sources are beans, lentils, eggs, tofu, and yogurt. As for fat, keep portion sizes in check by measuring out salad dressing, oil, and nut butters (shoot for one to two tablespoons). Finally, eat veggies or a little fruit at every meal. People who did that consumed 308 fewer calories but didn't feel any hungrier than when they didn't eat more produce.  7. How You Eat Is As Important As What You Eat In order for your brain to register that you're full, you need to focus on what you're eating. Sit down whenever you eat, preferably at a table. Turn off the TV or computer, put down your phone, and look at your food. Smell it. Chew slowly, and don't put another bite on your  fork until you swallow. When women ate lunch this attentively, they consumed 30 percent less when snacking later than those who listened to an audiobook at lunchtime, according to a study in the [place] of Nutrition. 8. Weighing Yourself Really Works The scale provides the best evidence about whether your efforts are paying off. Seeing the numbers tick up or down or stagnate is motivation to keep going-or to rethink your approach. A 2015 study at [facility] found that daily weigh-ins helped people lose more weight, keep it off, and maintain that loss, even after two years. Use it to lose it. Step on the scale at the same time every day for the best results. If your weight shoots up several pounds from one weigh-in to the next, don't freak out. Eating a lot of salt the night before or having your period is the likely culprit. The number should return to normal in a day or two. It's a steady climb that you need to do something about. 9. Too Much  Stress and Too Little Sleep Are Your Enemies When you're tired and frazzled, your body cranks up the production of cortisol, the stress hormone that can cause carb cravings. Not getting enough sleep also boosts your levels of ghrelin, a hormone associated with hunger, while suppressing leptin, a hormone that signals fullness and satiety. People on a diet who slept only five and a half hours a night for two weeks lost 55 percent less fat and were hungrier than those who slept eight and a half hours, according to a study in the [place]. Use it to lose it. Prioritize sleep, aiming for seven hours or more a night, which research shows helps lower stress. And make sure you're getting quality zzz's. If a snoring spouse or a fidgety cat wakes you up frequently throughout the night, you may end up getting the equivalent of just four hours of sleep, according to a study from [facility]. Keep pets out of the bedroom, and use a  white-noise app to drown out snoring. 10. You Will Hit a plateau-And You Can Bust Through It As you slim down, your body releases much less leptin, the fullness hormone.  If you're not strength training, start right now. Building muscle can raise your metabolism to help you overcome a plateau. To keep your body challenged and burning calories, incorporate new moves and more intense intervals into your workouts or add another sweat session to your weekly routine. Alternatively, cut an extra 100 calories or so a day from your diet. Now that you've lost weight, your body simply doesn't need as much fuel.    Since food equals calories, in order to lose weight you must either eat fewer calories, exercise more to burn off calories with activity, or both. Food that is not used to fuel the body is stored as fat. A major component of losing weight is to make smarter food choices. Here's how:  1)   Limit non-nutritious foods, such as: Sugar, honey, syrups and candy Pastries, donuts, pies, cakes and cookies Soft drinks, sweetened juices and alcoholic beverages  2)  Cut down on high-fat foods by: - Choosing poultry, fish or lean red meat - Choosing low-fat cooking methods, such as baking, broiling, steaming, grilling and boiling - Using low-fat or non-fat dairy products - Using vinaigrette, herbs, lemon or fat-free salad dressings - Avoiding fatty meats, such as bacon, sausage, franks, ribs and luncheon meats - Avoiding high-fat snacks like nuts, chips and chocolate - Avoiding fried foods - Using less butter, margarine, oil and mayonnaise - Avoiding high-fat gravies, cream sauces and cream-based soups  3) Eat a variety of foods, including: - Fruit and vegetables that are raw, steamed or baked - Whole grains, breads, cereal, rice and pasta - Dairy products, such as low-fat or non-fat milk or yogurt, low-fat cottage cheese and low-fat cheese - Protein-rich foods like chicken, [place], fish, lean meat  and legumes, or beans  4) Change your eating habits by: - Eat three balanced meals a day to help control your hunger - Watch portion sizes and eat small servings of a variety of foods - Choose low-calorie snacks - Eat only when you are hungry and stop when you are satisfied - Eat slowly and try not to perform other tasks while eating - Find other activities to distract you from food, such as walking, taking up a hobby or being involved in the community - Include regular exercise in your daily routine ( minimum of 20 min  of moderate-intensity exercise at least 5 days/week)  - Find a support group, if necessary, for emotional support in your weight loss journey           Easy ways to cut 100 calories   1. Eat your eggs with hot sauce OR salsa instead of cheese.  Eggs are great for breakfast, but many people consider eggs and cheese to be BFFs. Instead of cheese-1 oz. of cheddar has 114 calories-top your eggs with hot sauce, which contains no calories and helps with satiety and metabolism. Salsa is also a great option!!  2. Top your toast, waffles or pancakes with fresh berries instead of jelly or syrup. Half a cup of berries-fresh, frozen or thawed-has about 40 calories, compared with 2 tbsp. of maple syrup or jelly, which both have about 100 calories. The berries will also give you a good punch of fiber, which helps keep you full and satisfied and won't spike blood sugar quickly like the jelly or syrup. 3. Swap the non-fat latte for black coffee with a splash of half-and-half. Contrary to its name, that non-fat latte has 130 calories and a startling 19g of carbohydrates per 16 oz. serving. Replacing that 'light' drinkable dessert with a black coffee with a splash of half-and-half saves you more than 100 calories per 16 oz. serving. 4. Sprinkle salads with freeze-dried raspberries instead of dried cranberries. If you want a sweet addition to your nutritious salad, stay away from dried  cranberries. They have a whopping 130 calories per  cup and 30g carbohydrates. Instead, sprinkle freeze-dried raspberries guilt-free and save more than 100 calories per  cup serving, adding 3g of belly-filling fiber. 5. Go for mustard in place of mayo on your sandwich. Mustard can add really nice flavor to any sandwich, and there are tons of varieties, from spicy to honey. A serving of mayo is 95 calories, versus 10 calories in a serving of mustard.  Or try an avocado mayo spread: You can find the recipe few click this link: https://www.californiaavocado.com/recipes/recipe-container/[place]-avocado-mayo 6. Choose a DIY salad dressing instead of the store-bought kind. Mix Dijon or whole grain mustard with low-fat Kefir or red wine vinegar and garlic. 7. Use hummus as a spread instead of a dip. Use hummus as a spread on a high-fiber cracker or tortilla with a sandwich and save on calories without sacrificing taste. 8. Pick just one salad "accessory." Salad isn't automatically a calorie winner. It's easy to over-accessorize with toppings. Instead of topping your salad with nuts, avocado and cranberries (all three will clock in at 313 calories), just pick one. The next day, choose a different accessory, which will also keep your salad interesting. You don't wear all your jewelry every day, right? 9. Ditch the white pasta in favor of spaghetti squash. One cup of cooked spaghetti squash has about 40 calories, compared with traditional spaghetti, which comes with more than 200. Spaghetti squash is also nutrient-dense. It's a good source of fiber and Vitamins A and C, and it can be eaten just like you would eat pasta-with a great tomato sauce and [place] meatballs or with pesto, tofu and spinach, for example. 10. Dress up your chili, soups and stews with non-fat [place] yogurt instead of sour cream. Just a 'dollop' of sour cream can set you back 115 calories and a whopping 12g of fat-seven of which are of  the artery-clogging variety. Added bonus: [place] yogurt is packed with muscle-building protein, calcium and B Vitamins. 11. Mash cauliflower instead of mashed potatoes.  One cup of traditional mashed potatoes-in all their creamy goodness-has more than 200 calories, compared to mashed cauliflower, which you can typically eat for less than 100 calories per 1 cup serving. Cauliflower is a great source of the antioxidant indole-3-carbinol (I3C), which may help reduce the risk of some cancers, like breast cancer. 12. Ditch the ice cream sundae in favor of a [place] yogurt parfait. Instead of a cup of ice cream or fro-yo for dessert, try 1 cup of nonfat Greek yogurt topped with fresh berries and a sprinkle of cacao nibs. Both toppings are packed with antioxidants, which can help reduce cellular inflammation and oxidative damage. And the comparison is a no-brainer: One cup of ice cream has about 275 calories; one cup of frozen yogurt has about 230; and a cup of Greek yogurt has just 130, plus twice the protein, so you're less likely to return to the freezer for a second helping. 13. Put olive oil in a spray container instead of using it directly from the bottle. Each tablespoon of olive oil is 120 calories and 15g of fat. Use a mister instead of pouring it straight into the pan or onto a salad. This allows for portion control and will save you more than 100 calories. 14. When baking, substitute canned pumpkin for butter or oil. Canned pumpkin-not pumpkin pie mix-is loaded with Vitamin A, which is important for skin and eye health, as well as immunity. And the comparisons are pretty crazy:  cup of canned pumpkin has about 40 calories, compared to butter or oil, which has more than 800 calories. Yes, 800 calories. Applesauce and mashed banana can also serve as good substitutions for butter or oil, usually in a 1:1 ratio. 15. Top casseroles with high-fiber cereal instead of breadcrumbs. Breadcrumbs are typically made  with white bread, while breakfast cereals contain 5-9g of fiber per serving. Not only will you save more than 150 calories per  cup serving, the swap will also keep you more full and you'll get a metabolism boost from the added fiber. 16. Snack on pistachios instead of macadamia nuts. Believe it or not, you get the same amount of calories from 35 pistachios (100 calories) as you would from only five macadamia nuts. 17. Chow down on kale chips rather than potato chips. This is my favorite 'don't knock it 'till you try it' swap. Kale chips are so easy to make at home, and you can spice them up with a little grated parmesan or chili powder. Plus, they're a mere fraction of the calories of potato chips, but with the same crunch factor we crave so often. 18. Add seltzer and some fruit slices to your cocktail instead of soda or fruit juice. One cup of soda or fruit juice can pack on as much as 140 calories. Instead, use seltzer and fruit slices. The fruit provides valuable phytochemicals, such as flavonoids and anthocyanins, which help to combat cancer and stave off the aging process.

## 2019-05-04 NOTE — Progress Notes (Signed)
Impression and Recommendations:    1. Poorly controlled type 2 diabetes mellitus ([name])- on insulin now for mgt   2. Need for influenza vaccination   3. Moderate episode of recurrent major depressive disorder ([name])   4. Hyperlipidemia associated with type 2 diabetes mellitus ([name])   5. Vitamin D deficiency   6. Class 1 obesity due to excess calories with serious comorbidity in adult, unspecified BMI     - Last visit on 02/04/2019.   Controlled Type 2 Diabetes Mellitus - Stable at this time.  - A1c most recently is at goal. - HbA1c down to 6.2 from 8.4 in August 2020, down from 11.3 in June of 2020. - Pt will continue current treatment regimen.  - Counseled patient on pathophysiology of disease and discussed various treatment options, which always includes dietary and lifestyle modification as first line.    - Importance of low carb, heart-healthy diet discussed with patient in addition to regular aerobic exercise of 1mn 5d/week or more.   - Check FBS and 2 hours after the biggest meal of your day.  Keep log and bring in next OV for my review.     - Also told patient if you ever feel poorly, please check your blood pressure and blood sugar, as one or the other could be the cause of your symptoms.  - Pt reminded about need for yearly eye and foot exams.  Told patient to make appt.for diabetic eye exam, CMAs here will do foot exams  - Handouts provided at patient's desire and or told to go online at the American Diabetes Association website for further information  - We will continue to monitor   Hypertension associated with DM - BP currently at goal.  - Patient will continue current treatment regimen.  See med list.  - Counseled patient on pathophysiology of disease and discussed various treatment options, which always includes dietary and lifestyle modification as first line.   - Lifestyle changes such as dash and heart healthy diets and engaging in a regular  exercise program discussed extensively with patient.   - Ambulatory blood pressure monitoring encouraged at least 3 times weekly.  Keep log and bring in every office visit.  Reminded patient that if they ever feel poorly in any way, to check their blood pressure and pulse.  - Handouts provided at patient's desire and/or told to go online at the [place] for further information  - We will continue to monitor   Hyperlipidemia associated with DM - Discussed importance of maintaining goal cholesterol in diabetics. - Reviewed guidelines for all diabetics to be managed on statin. - Will re-check fasting lipid panel in near future and re-evaluate management. - Will continue to monitor.   Moderate Episode of Major Depressive Disorder - Stable at this time. - Continue management as prescribed.  See med list. - Discussed importance to wait on possibly weaning off of prescription until outside stressors (such as COVID) are greatly diminished.  - In addition to prescription intervention, the "spokes of the wheel" of mood and health management.  Stressed the importance of ongoing prudent habits, including regular exercise, appropriate sleep hygiene, healthful dietary habits, and prayer/meditation to relax.  - Will continue to monitor.   BMI Counseling - Body mass index is 30.42 kg/m; Class 1 Obesity Explained to patient what BMI refers to, and what it means medically.    Told patient to think about it as a "medical risk stratification measurement" and  how increasing BMI is associated with increasing risk/ or worsening state of various diseases such as hypertension, hyperlipidemia, diabetes, premature OA, depression etc.  American Heart Association guidelines for healthy diet, basically Mediterranean diet, and exercise guidelines of 30 minutes 5 days per week or more discussed in detail.  Health counseling performed.  All questions answered.  - Patient indicates interest  in establishing with [facility]. - Ambulatory referral placed today.  See orders.  Lifestyle & Preventative Health Maintenance - Advised patient to continue working toward exercising to improve overall mental, physical, and emotional health.    - Encouraged patient to engage in daily physical activity, especially a formal exercise routine.  Recommended that the patient eventually strive for at least 150 minutes of moderate cardiovascular activity per week according to guidelines established by the [facility].   - Healthy dietary habits encouraged, including low-carb, and high amounts of lean protein in diet.   - Patient should also consume adequate amounts of water.   Recommendations - Last CPE obtained 08/14/2017. - Return before the end of the year for CPE and full fasting blood work, immunizations.  - Goal of 20 minutes of exercise, 4 days per week.   Orders Placed This Encounter  Procedures  . Flu Vaccine QUAD 6+ mos PF IM (Fluarix Quad PF)  . Amb Ref to [facility]  . POCT HgB A1C    Gross side effects, risk and benefits, and alternatives of medications and treatment plan in general discussed with patient.  Patient is aware that all medications have potential side effects and we are unable to predict every side effect or drug-drug interaction that may occur.   Patient will call with any questions prior to using medication if they have concerns.    Expresses verbal understanding and consents to current therapy and treatment regimen.  No barriers to understanding were identified.  Red flag symptoms and signs discussed in detail.  Patient expressed understanding regarding what to do in case of emergency\urgent symptoms  Please see AVS handed out to patient at the end of our visit for further patient instructions/ counseling done pertaining to today's office visit.   Return for CPE and full fasting blood work same day before 2021.     Note:  This note was  prepared with assistance of Dragon voice recognition software. Occasional wrong-word or sound-a-like substitutions may have occurred due to the inherent limitations of voice recognition software.   This document serves as a record of services personally performed by Mellody Dance, DO. It was created on her behalf by Toni Amend, a trained medical scribe. The creation of this record is based on the scribe's personal observations and the provider's statements to them.   This case required medical decision making of at least moderate complexity. The above documentation has been reviewed to be accurate and was completed by Marjory Sneddon, D.O.      --------------------------------------------------------------------------------------------------------------------------------------------------------------------------------------------------------------------------------------------    Subjective:    Phillips Odor, am serving as scribe for Dr. Mellody Dance.   HPI: Lindsey Mason is a 48 y.o. female who presents to [place] [facility] today for issues as discussed below.  Had her hand surgery a month ago with Dr. Fredna Dow (contracture of palmar fascia).  Says that this surgery was the most painful thing she's endured in her life, even after an appendectomy and three C-sections.  Notes her sugars went up into the 200's.  Thinks her fasting sugars went up  to 180.  Notes she has gained a lot of weight and comments "it's awful."  Notes she has had three surgeries since June.  Says she tries to do 20 minutes of exercise, 3-4 times per week.  HPI:   Diabetes Mellitus:  Home glucose readings:  Her last couple of weeks "have not been wonderful."  Notes "I haven't been doing the activity that I need to be doing."   - Patient reports good compliance with therapy plan: medication and/or lifestyle modification  - Her denies acute concerns or problems  related to treatment plan  - She denies new concerns.  Denies polyuria/polydipsia, hypo/ hyperglycemia symptoms.  Denies new onset of: chest pain, exercise intolerance, shortness of breath, dizziness, visual changes, headache, lower extremity swelling or claudication.   Last A1C in the office was:  Lab Results  Component Value Date   HGBA1C 6.2 (A) 05/04/2019   HGBA1C 8.4 (A) 02/04/2019   HGBA1C 11.3 (H) 12/25/2018   Lab Results  Component Value Date   MICROALBUR 10 02/04/2019   LDLCALC 81 08/14/2017   CREATININE 0.46 (L) 12/31/2018   BP Readings from Last 3 Encounters:  05/04/19 129/83  02/04/19 112/75  12/31/18 127/84   Wt Readings from Last 3 Encounters:  05/04/19 194 lb 3.2 oz (88.1 kg)  02/04/19 175 lb 4.8 oz (79.5 kg)  12/31/18 174 lb 3.2 oz (79 kg)   HPI:  Hypertension:  -  Her blood pressure at home has been running: at goal.  Says she's been salting things more than normal lately.  - Patient reports good compliance with medication and/or lifestyle modification  - Her denies acute concerns or problems related to treatment plan  - She denies new onset of: chest pain, exercise intolerance, shortness of breath, dizziness, visual changes, headache, lower extremity swelling or claudication.   Last 3 blood pressure readings in our office are as follows: BP Readings from Last 3 Encounters:  05/04/19 129/83  02/04/19 112/75  12/31/18 127/84   Filed Weights   05/04/19 1536  Weight: 194 lb 3.2 oz (88.1 kg)        Wt Readings from Last 3 Encounters:  05/04/19 194 lb 3.2 oz (88.1 kg)  02/04/19 175 lb 4.8 oz (79.5 kg)  12/31/18 174 lb 3.2 oz (79 kg)   BP Readings from Last 3 Encounters:  05/04/19 129/83  02/04/19 112/75  12/31/18 127/84   Pulse Readings from Last 3 Encounters:  05/04/19 94  02/04/19 95  12/31/18 83   BMI Readings from Last 3 Encounters:  05/04/19 30.42 kg/m  02/04/19 28.29 kg/m  12/31/18 28.12 kg/m     Patient Care Team     Relationship Specialty Notifications Start End  Mellody Dance, DO PCP - General Family Medicine  08/14/17   Marin Comment, [place], [place] Referring Physician Optometry  08/04/17   [facility]    02/16/19   Daryll Brod, [place] Physician Orthopedic Surgery  05/04/19      Patient Active Problem List   Diagnosis Date Noted  . Major depressive disorder, recurrent episode ([place]) 08/20/2010    Priority: High  . Hyperlipidemia associated with type 2 diabetes mellitus ([place]) 04/11/2009    Priority: High  . Poorly controlled type 2 diabetes mellitus ([place])- on insulin now for mgt 03/08/2009    Priority: High  . Vitamin D deficiency 08/26/2017    Priority: Medium  . Nausea and vomiting 12/24/2018  . Nausea & vomiting 12/24/2018  . Generalized abdominal pain 12/24/2018  .  Statin declined 05/05/2018  . Bloating 03/10/2018  . Alternating constipation and diarrhea 03/10/2018  . Pyrosis 03/10/2018  . Abnormal liver ultrasound 03/10/2018  . Calculus of gallbladder without cholecystitis without obstruction 03/10/2018  . h/o Enlarged ovaries 11/20/2017  . Environmental and seasonal allergies 11/04/2017  . Psychophysiological insomnia 11/04/2017  . Depression 09/03/2016  . Overweight (BMI 25.0-29.9) 09/03/2016  . Witnessed episode of apnea 09/03/2016  . Urinary frequency 04/03/2016  . Acute maxillary sinusitis 11/04/2014  . 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 PCOS (polycystic ovarian syndrome) 05/27/2012  . 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 GERD 03/08/2009    Past Medical history, Surgical history, Family history, Social history, Allergies and Medications have been entered into the medical record, reviewed and changed as needed.    Current Meds  Medication Sig  . blood glucose meter kit and supplies Dispense based on patient and insurance preference. Use in the morning when fasting and 2 hours after largest meal. (FOR ICD-10 E10.9,  E11.9).  [name] buPROPion (WELLBUTRIN XL) 300 MG 24 hr tablet Take 1 tablet (300 mg total) by mouth daily. Needs office visit for refills  . Continuous Blood Gluc Receiver (DEXCOM G5 RECEIVER KIT) DEVI 1 kit by Does not apply route 2 (two) times daily.  . Continuous Blood Gluc Sensor (DEXCOM G6 SENSOR) MISC 1 Device by Does not apply route 2 (two) times daily.  [name] FLUoxetine (PROZAC) 20 MG tablet Take 1 tablet (20 mg total) by mouth daily.  . fluticasone (FLONASE) 50 MCG/ACT nasal spray 1 spray each nostril after sinus rinse twice daily (Patient taking differently: Place 1 spray into both nostrils daily. )  . ibuprofen (ADVIL) 200 MG tablet Take 400 mg by mouth every 6 (six) hours as needed for moderate pain.  [name] insulin detemir (LEVEMIR) 100 UNIT/ML injection INJECT 20 UNITS SUBCUTANEOUSLY EVERY 12 HOURS  . insulin regular (NOVOLIN R) 100 units/mL injection Inject into the skin 3 (three) times daily before meals. 5 units with each meal plus Sliding Scale   . metFORMIN (GLUCOPHAGE-XR) 500 MG 24 hr tablet Take 4 tablets (2,000 mg total) by mouth daily with breakfast.  . NOVOLOG FLEXPEN 100 UNIT/ML FlexPen INJECT 5 UNITS SUBCUTANEOUSLY THREE TIMES DAILY WITH MEALS PER SLIDING SCALE  . ONETOUCH VERIO test strip USE IN THE MORNING WHEN FASTING AND 2 HOURS AFTER LARGEST MEAL  . pantoprazole (PROTONIX) 20 MG tablet Take 1 tablet (20 mg total) by mouth daily.  . Vitamin D, Ergocalciferol, (DRISDOL) 50000 units CAPS capsule Please take 1 tablet Wednesday and 1 tablet Sunday every week    Allergies:  Allergies  Allergen Reactions  . Lantus [Insulin Glargine] Other (See Comments)    headaches     Review of Systems:  A fourteen system review of systems was performed and found to be positive as per HPI.   Objective:   Blood pressure 129/83, pulse 94, temperature 98.5 F (36.9 C), temperature source Oral, resp. rate 10, height '5\' 7"'  (1.702 m), weight 194 lb 3.2 oz (88.1 kg), SpO2 99 %. Body mass index is  30.42 kg/m. General:  Well Developed, well nourished, appropriate for stated age.  Neuro:  Alert and oriented,  extra-ocular muscles intact  HEENT:  Normocephalic, atraumatic, neck supple, no carotid bruits appreciated  Skin:  no gross rash, warm, pink. Cardiac:  RRR, S1 S2 Respiratory:  ECTA B/L and A/P, Not using accessory muscles, speaking in full sentences- unlabored. Vascular:  Ext warm, no cyanosis apprec.;  cap RF less 2 sec. Psych:  No HI/SI, judgement and insight good, Euthymic mood. Full Affect.

## 2019-05-26 ENCOUNTER — Other Ambulatory Visit: Payer: Self-pay | Admitting: Family Medicine

## 2019-05-26 DIAGNOSIS — F331 Major depressive disorder, recurrent, moderate: Secondary | ICD-10-CM

## 2019-06-02 ENCOUNTER — Encounter: Payer: Self-pay | Admitting: Family Medicine

## 2019-06-05 ENCOUNTER — Other Ambulatory Visit: Payer: Self-pay | Admitting: Family Medicine

## 2019-06-05 DIAGNOSIS — E1165 Type 2 diabetes mellitus with hyperglycemia: Secondary | ICD-10-CM

## 2019-06-13 ENCOUNTER — Telehealth: Payer: BC Managed Care – PPO | Admitting: Physician Assistant

## 2019-06-13 DIAGNOSIS — R0602 Shortness of breath: Secondary | ICD-10-CM

## 2019-06-13 DIAGNOSIS — Z20822 Contact with and (suspected) exposure to covid-19: Secondary | ICD-10-CM

## 2019-06-13 NOTE — Progress Notes (Signed)
Based on what you shared with me, I feel your condition warrants further evaluation and I recommend that you be seen for a face to face office visit.  I am glad you got retested for COVID today. I am concerned you had a false negative test before. The concern is the shallow breathing and the pain between your shoulder blades. I recommend you be seen at local Urgent Care so you can get a comprehensive lung examination to make sure there is not concern for pneumonia or other pulmonary cause of the pain. Please do not delay care.    NOTE: If you entered your credit card information for this eVisit, you will not be charged. You may see a "hold" on your card for the $35 but that hold will drop off and you will not have a charge processed.   If you are having a true medical emergency please call 911.      For an urgent face to face visit, [facility] has five urgent care centers for your convenience:      NEW:  [facility] [place] [facility] [place] Get Driving Directions [id number] [place], [place] [address] . 10 am - 6pm Monday - Friday    [facility]) Get Driving Directions [id number] [address] [place], [place] [address] . 10 am to 8 pm Monday-Friday . 12 pm to 8 pm [facility] at [facility] Get Driving Directions [id number] [place], [place], [place] [address] . 8 am to 8 pm Monday-Friday . 9 am to 6 pm Saturday . 11 am to 6 pm Sunday     [facility] at [facility] Get Driving Directions  [id number] [name], [place] [address] . 8 am to 8 pm Monday-Friday . 8 am to 4 pm [facility] at [place] Get Driving Directions [id number] [address] [place], [place] [address] . 12 pm to 6 pm Monday-Friday      Your e-visit answers were reviewed by a board  certified advanced clinical practitioner to complete your personal care plan.  Thank you for using e-Visits.

## 2019-06-28 ENCOUNTER — Encounter: Payer: Self-pay | Admitting: Family Medicine

## 2019-06-28 ENCOUNTER — Ambulatory Visit (INDEPENDENT_AMBULATORY_CARE_PROVIDER_SITE_OTHER): Payer: BC Managed Care – PPO | Admitting: Family Medicine

## 2019-06-28 ENCOUNTER — Other Ambulatory Visit: Payer: Self-pay

## 2019-06-28 VITALS — BP 127/75 | HR 92 | Temp 98.5°F | Resp 10 | Ht 67.0 in | Wt 192.3 lb

## 2019-06-28 DIAGNOSIS — E663 Overweight: Secondary | ICD-10-CM | POA: Diagnosis not present

## 2019-06-28 DIAGNOSIS — Z0001 Encounter for general adult medical examination with abnormal findings: Secondary | ICD-10-CM

## 2019-06-28 DIAGNOSIS — R197 Diarrhea, unspecified: Secondary | ICD-10-CM | POA: Diagnosis not present

## 2019-06-28 DIAGNOSIS — E559 Vitamin D deficiency, unspecified: Secondary | ICD-10-CM

## 2019-06-28 DIAGNOSIS — E1165 Type 2 diabetes mellitus with hyperglycemia: Secondary | ICD-10-CM | POA: Diagnosis not present

## 2019-06-28 DIAGNOSIS — E785 Hyperlipidemia, unspecified: Secondary | ICD-10-CM

## 2019-06-28 DIAGNOSIS — E1169 Type 2 diabetes mellitus with other specified complication: Secondary | ICD-10-CM | POA: Diagnosis not present

## 2019-06-28 DIAGNOSIS — E6609 Other obesity due to excess calories: Secondary | ICD-10-CM

## 2019-06-28 DIAGNOSIS — E66811 Obesity, class 1: Secondary | ICD-10-CM

## 2019-06-28 DIAGNOSIS — Z1231 Encounter for screening mammogram for malignant neoplasm of breast: Secondary | ICD-10-CM

## 2019-06-28 NOTE — Patient Instructions (Signed)
Preventive Care for Adults, Female  A healthy lifestyle and preventive care can promote health and wellness. Preventive health guidelines for women include the following key practices.   A routine yearly physical is a good way to check with your health care provider about your health and preventive screening. It is a chance to share any concerns and updates on your health and to receive a thorough exam.   Visit your dentist for a routine exam and preventive care every 6 months. Brush your teeth twice a day and floss once a day. Good oral hygiene prevents tooth decay and gum disease.   The frequency of eye exams is based on your age, health, family medical history, use of contact lenses, and other factors. Follow your health care provider's recommendations for frequency of eye exams.   Eat a healthy diet. Foods like vegetables, fruits, whole grains, low-fat dairy products, and lean protein foods contain the nutrients you need without too many calories. Decrease your intake of foods high in solid fats, added sugars, and salt. Eat the right amount of calories for you.Get information about a proper diet from your health care provider, if necessary.   Regular physical exercise is one of the most important things you can do for your health. Most adults should get at least 150 minutes of moderate-intensity exercise (any activity that increases your heart rate and causes you to sweat) each week. In addition, most adults need muscle-strengthening exercises on 2 or more days a week.   Maintain a healthy weight. The body mass index (BMI) is a screening tool to identify possible weight problems. It provides an estimate of body fat based on height and weight. Your health care provider can find your BMI, and can help you achieve or maintain a healthy weight.For adults 20 years and older:   - A BMI below 18.5 is considered underweight.   - A BMI of 18.5 to 24.9 is normal.   - A BMI of 25 to 29.9 is  considered overweight.   - A BMI of 30 and above is considered obese.   Maintain normal blood lipids and cholesterol levels by exercising and minimizing your intake of trans and saturated fats.  Eat a balanced diet with plenty of fruit and vegetables. Blood tests for lipids and cholesterol should begin at age 20 and be repeated every 5 years minimum.  If your lipid or cholesterol levels are high, you are over 40, or you are at high risk for heart disease, you may need your cholesterol levels checked more frequently.Ongoing high lipid and cholesterol levels should be treated with medicines if diet and exercise are not working.   If you smoke, find out from your health care provider how to quit. If you do not use tobacco, do not start.   Lung cancer screening is recommended for adults aged 55-80 years who are at high risk for developing lung cancer because of a history of smoking. A yearly low-dose CT scan of the lungs is recommended for people who have at least a 30-pack-year history of smoking and are a current smoker or have quit within the past 15 years. A pack year of smoking is smoking an average of 1 pack of cigarettes a day for 1 year (for example: 1 pack a day for 30 years or 2 packs a day for 15 years). Yearly screening should continue until the smoker has stopped smoking for at least 15 years. Yearly screening should be stopped for people who develop a   health problem that would prevent them from having lung cancer treatment.   If you are pregnant, do not drink alcohol. If you are breastfeeding, be very cautious about drinking alcohol. If you are not pregnant and choose to drink alcohol, do not have more than 1 drink per day. One drink is considered to be 12 ounces (355 mL) of beer, 5 ounces (148 mL) of wine, or 1.5 ounces (44 mL) of liquor.   Avoid use of street drugs. Do not share needles with anyone. Ask for help if you need support or instructions about stopping the use of  drugs.   High blood pressure causes heart disease and increases the risk of stroke. Your blood pressure should be checked at least yearly.  Ongoing high blood pressure should be treated with medicines if weight loss and exercise do not work.   If you are 69-55 years old, ask your health care provider if you should take aspirin to prevent strokes.   Diabetes screening involves taking a blood sample to check your fasting blood sugar level. This should be done once every 3 years, after age 38, if you are within normal weight and without risk factors for diabetes. Testing should be considered at a younger age or be carried out more frequently if you are overweight and have at least 1 risk factor for diabetes.   Breast cancer screening is essential preventive care for women. You should practice "breast self-awareness."  This means understanding the normal appearance and feel of your breasts and may include breast self-examination.  Any changes detected, no matter how small, should be reported to a health care provider.  Women in their 80s and 30s should have a clinical breast exam (CBE) by a health care provider as part of a regular health exam every 1 to 3 years.  After age 66, women should have a CBE every year.  Starting at age 1, women should consider having a mammogram (breast X-ray test) every year.  Women who have a family history of breast cancer should talk to their health care provider about genetic screening.  Women at a high risk of breast cancer should talk to their health care providers about having an MRI and a mammogram every year.   -Breast cancer gene (BRCA)-related cancer risk assessment is recommended for women who have family members with BRCA-related cancers. BRCA-related cancers include breast, ovarian, tubal, and peritoneal cancers. Having family members with these cancers may be associated with an increased risk for harmful changes (mutations) in the breast cancer genes BRCA1 and  BRCA2. Results of the assessment will determine the need for genetic counseling and BRCA1 and BRCA2 testing.   The Pap test is a screening test for cervical cancer. A Pap test can show cell changes on the cervix that might become cervical cancer if left untreated. A Pap test is a procedure in which cells are obtained and examined from the lower end of the uterus (cervix).   - Women should have a Pap test starting at age 57.   - Between ages 90 and 70, Pap tests should be repeated every 2 years.   - Beginning at age 63, you should have a Pap test every 3 years as long as the past 3 Pap tests have been normal.   - Some women have medical problems that increase the chance of getting cervical cancer. Talk to your health care provider about these problems. It is especially important to talk to your health care provider if a  new problem develops soon after your last Pap test. In these cases, your health care provider may recommend more frequent screening and Pap tests.   - The above recommendations are the same for women who have or have not gotten the vaccine for human papillomavirus (HPV).   - If you had a hysterectomy for a problem that was not cancer or a condition that could lead to cancer, then you no longer need Pap tests. Even if you no longer need a Pap test, a regular exam is a good idea to make sure no other problems are starting.   - If you are between ages 36 and 66 years, and you have had normal Pap tests going back 10 years, you no longer need Pap tests. Even if you no longer need a Pap test, a regular exam is a good idea to make sure no other problems are starting.   - If you have had past treatment for cervical cancer or a condition that could lead to cancer, you need Pap tests and screening for cancer for at least 20 years after your treatment.   - If Pap tests have been discontinued, risk factors (such as a new sexual partner) need to be reassessed to determine if screening should  be resumed.   - The HPV test is an additional test that may be used for cervical cancer screening. The HPV test looks for the virus that can cause the cell changes on the cervix. The cells collected during the Pap test can be tested for HPV. The HPV test could be used to screen women aged 70 years and older, and should be used in women of any age who have unclear Pap test results. After the age of 67, women should have HPV testing at the same frequency as a Pap test.   Colorectal cancer can be detected and often prevented. Most routine colorectal cancer screening begins at the age of 57 years and continues through age 26 years. However, your health care provider may recommend screening at an earlier age if you have risk factors for colon cancer. On a yearly basis, your health care provider may provide home test kits to check for hidden blood in the stool.  Use of a small camera at the end of a tube, to directly examine the colon (sigmoidoscopy or colonoscopy), can detect the earliest forms of colorectal cancer. Talk to your health care provider about this at age 23, when routine screening begins. Direct exam of the colon should be repeated every 5 -10 years through age 49 years, unless early forms of pre-cancerous polyps or small growths are found.   People who are at an increased risk for hepatitis B should be screened for this virus. You are considered at high risk for hepatitis B if:  -You were born in a country where hepatitis B occurs often. Talk with your health care provider about which countries are considered high risk.  - Your parents were born in a high-risk country and you have not received a shot to protect against hepatitis B (hepatitis B vaccine).  - You have HIV or AIDS.  - You use needles to inject street drugs.  - You live with, or have sex with, someone who has Hepatitis B.  - You get hemodialysis treatment.  - You take certain medicines for conditions like cancer, organ  transplantation, and autoimmune conditions.   Hepatitis C blood testing is recommended for all people born from [birth year] through 1965 and any individual  with known risks for hepatitis C.   Practice safe sex. Use condoms and avoid high-risk sexual practices to reduce the spread of sexually transmitted infections (STIs). STIs include gonorrhea, chlamydia, syphilis, trichomonas, herpes, HPV, and human immunodeficiency virus (HIV). Herpes, HIV, and HPV are viral illnesses that have no cure. They can result in disability, cancer, and death. Sexually active women aged 25 years and younger should be checked for chlamydia. Older women with new or multiple partners should also be tested for chlamydia. Testing for other STIs is recommended if you are sexually active and at increased risk.   Osteoporosis is a disease in which the bones lose minerals and strength with aging. This can result in serious bone fractures or breaks. The risk of osteoporosis can be identified using a bone density scan. Women ages 65 years and over and women at risk for fractures or osteoporosis should discuss screening with their health care providers. Ask your health care provider whether you should take a calcium supplement or vitamin D to There are also several preventive steps women can take to avoid osteoporosis and resulting fractures or to keep osteoporosis from worsening. -->Recommendations include:  Eat a balanced diet high in fruits, vegetables, calcium, and vitamins.  Get enough calcium. The recommended total intake of is 1,200 mg daily; for best absorption, if taking supplements, divide doses into 250-500 mg doses throughout the day. Of the two types of calcium, calcium carbonate is best absorbed when taken with food but calcium citrate can be taken on an empty stomach.  Get enough vitamin D. NAMS and the National Osteoporosis Foundation recommend at least 1,000 IU per day for women age 50 and over who are at risk of vitamin D  deficiency. Vitamin D deficiency can be caused by inadequate sun exposure (for example, those who live in northern latitudes).  Avoid alcohol and smoking. Heavy alcohol intake (more than 7 drinks per week) increases the risk of falls and hip fracture and women smokers tend to lose bone more rapidly and have lower bone mass than nonsmokers. Stopping smoking is one of the most important changes women can make to improve their health and decrease risk for disease.  Be physically active every day. Weight-bearing exercise (for example, fast walking, hiking, jogging, and weight training) may strengthen bones or slow the rate of bone loss that comes with aging. Balancing and muscle-strengthening exercises can reduce the risk of falling and fracture.  Consider therapeutic medications. Currently, several types of effective drugs are available. Healthcare providers can recommend the type most appropriate for each woman.  Eliminate environmental factors that may contribute to accidents. Falls cause nearly 90% of all osteoporotic fractures, so reducing this risk is an important bone-health strategy. Measures include ample lighting, removing obstructions to walking, using nonskid rugs on floors, and placing mats and/or grab bars in showers.  Be aware of medication side effects. Some common medicines make bones weaker. These include a type of steroid drug called glucocorticoids used for arthritis and asthma, some antiseizure drugs, certain sleeping pills, treatments for endometriosis, and some cancer drugs. An overactive thyroid gland or using too much thyroid hormone for an underactive thyroid can also be a problem. If you are taking these medicines, talk to your doctor about what you can do to help protect your bones.reduce the rate of osteoporosis.    Menopause can be associated with physical symptoms and risks. Hormone replacement therapy is available to decrease symptoms and risks. You should talk to your  health care provider   about whether hormone replacement therapy is right for you.   Use sunscreen. Apply sunscreen liberally and repeatedly throughout the day. You should seek shade when your shadow is shorter than you. Protect yourself by wearing long sleeves, pants, a wide-brimmed hat, and sunglasses year round, whenever you are outdoors.   Once a month, do a whole body skin exam, using a mirror to look at the skin on your back. Tell your health care provider of new moles, moles that have irregular borders, moles that are larger than a pencil eraser, or moles that have changed in shape or color.   -Stay current with required vaccines (immunizations).   Influenza vaccine. All adults should be immunized every year.  Tetanus, diphtheria, and acellular pertussis (Td, Tdap) vaccine. Pregnant women should receive 1 dose of Tdap vaccine during each pregnancy. The dose should be obtained regardless of the length of time since the last dose. Immunization is preferred during the 27th 36th week of gestation. An adult who has not previously received Tdap or who does not know her vaccine status should receive 1 dose of Tdap. This initial dose should be followed by tetanus and diphtheria toxoids (Td) booster doses every 10 years. Adults with an unknown or incomplete history of completing a 3-dose immunization series with Td-containing vaccines should begin or complete a primary immunization series including a Tdap dose. Adults should receive a Td booster every 10 years.  Varicella vaccine. An adult without evidence of immunity to varicella should receive 2 doses or a second dose if she has previously received 1 dose. Pregnant females who do not have evidence of immunity should receive the first dose after pregnancy. This first dose should be obtained before leaving the health care facility. The second dose should be obtained 4 8 weeks after the first dose.  Human papillomavirus (HPV) vaccine. Females aged 13 26  years who have not received the vaccine previously should obtain the 3-dose series. The vaccine is not recommended for use in pregnant females. However, pregnancy testing is not needed before receiving a dose. If a female is found to be pregnant after receiving a dose, no treatment is needed. In that case, the remaining doses should be delayed until after the pregnancy. Immunization is recommended for any person with an immunocompromised condition through the age of 26 years if she did not get any or all doses earlier. During the 3-dose series, the second dose should be obtained 4 8 weeks after the first dose. The third dose should be obtained 24 weeks after the first dose and 16 weeks after the second dose.  Zoster vaccine. One dose is recommended for adults aged 60 years or older unless certain conditions are present.  Measles, mumps, and rubella (MMR) vaccine. Adults born before 1957 generally are considered immune to measles and mumps. Adults born in 1957 or later should have 1 or more doses of MMR vaccine unless there is a contraindication to the vaccine or there is laboratory evidence of immunity to each of the three diseases. A routine second dose of MMR vaccine should be obtained at least 28 days after the first dose for students attending postsecondary schools, health care workers, or international travelers. People who received inactivated measles vaccine or an unknown type of measles vaccine during 1963 1967 should receive 2 doses of MMR vaccine. People who received inactivated mumps vaccine or an unknown type of mumps vaccine before 1979 and are at high risk for mumps infection should consider immunization with 2 doses of   MMR vaccine. For females of childbearing age, rubella immunity should be determined. If there is no evidence of immunity, females who are not pregnant should be vaccinated. If there is no evidence of immunity, females who are pregnant should delay immunization until after pregnancy.  Unvaccinated health care workers born before [birth year] who lack laboratory evidence of measles, mumps, or rubella immunity or laboratory confirmation of disease should consider measles and mumps immunization with 2 doses of MMR vaccine or rubella immunization with 1 dose of MMR vaccine.  Pneumococcal 13-valent conjugate (PCV13) vaccine. When indicated, a person who is uncertain of her immunization history and has no record of immunization should receive the PCV13 vaccine. An adult aged 54 years or older who has certain medical conditions and has not been previously immunized should receive 1 dose of PCV13 vaccine. This PCV13 should be followed with a dose of pneumococcal polysaccharide (PPSV23) vaccine. The PPSV23 vaccine dose should be obtained at least 8 weeks after the dose of PCV13 vaccine. An adult aged 58 years or older who has certain medical conditions and previously received 1 or more doses of PPSV23 vaccine should receive 1 dose of PCV13. The PCV13 vaccine dose should be obtained 1 or more years after the last PPSV23 vaccine dose.  Pneumococcal polysaccharide (PPSV23) vaccine. When PCV13 is also indicated, PCV13 should be obtained first. All adults aged 58 years and older should be immunized. An adult younger than age 65 years who has certain medical conditions should be immunized. Any person who resides in a nursing home or long-term care facility should be immunized. An adult smoker should be immunized. People with an immunocompromised condition and certain other conditions should receive both PCV13 and PPSV23 vaccines. People with human immunodeficiency virus (HIV) infection should be immunized as soon as possible after diagnosis. Immunization during chemotherapy or radiation therapy should be avoided. Routine use of PPSV23 vaccine is not recommended for American Indians, [place] Natives, or people younger than 65 years unless there are medical conditions that require PPSV23 vaccine. When indicated,  people who have unknown immunization and have no record of immunization should receive PPSV23 vaccine. One-time revaccination 5 years after the first dose of PPSV23 is recommended for people aged 70 64 years who have chronic kidney failure, nephrotic syndrome, asplenia, or immunocompromised conditions. People who received 1 2 doses of PPSV23 before age 32 years should receive another dose of PPSV23 vaccine at age 96 years or later if at least 5 years have passed since the previous dose. Doses of PPSV23 are not needed for people immunized with PPSV23 at or after age 55 years.  Meningococcal vaccine. Adults with asplenia or persistent complement component deficiencies should receive 2 doses of quadrivalent meningococcal conjugate (MenACWY-D) vaccine. The doses should be obtained at least 2 months apart. Microbiologists working with certain meningococcal bacteria, [employer] recruits, people at risk during an outbreak, and people who travel to or live in countries with a high rate of meningitis should be immunized. A first-year college student up through age 58 years who is living in a residence hall should receive a dose if she did not receive a dose on or after her 16th birthday. Adults who have certain high-risk conditions should receive one or more doses of vaccine.  Hepatitis A vaccine. Adults who wish to be protected from this disease, have certain high-risk conditions, work with hepatitis A-infected animals, work in hepatitis A research labs, or travel to or work in countries with a high rate of hepatitis A should be  immunized. Adults who were previously unvaccinated and who anticipate close contact with an international adoptee during the first 60 days after arrival in the [place] from a country with a high rate of hepatitis A should be immunized.  Hepatitis B vaccine.  Adults who wish to be protected from this disease, have certain high-risk conditions, may be exposed to blood or other infectious  body fluids, are household contacts or sex partners of hepatitis B positive people, are clients or workers in certain care facilities, or travel to or work in countries with a high rate of hepatitis B should be immunized.  Haemophilus influenzae type b (Hib) vaccine. A previously unvaccinated person with asplenia or sickle cell disease or having a scheduled splenectomy should receive 1 dose of Hib vaccine. Regardless of previous immunization, a recipient of a hematopoietic stem cell transplant should receive a 3-dose series 6 12 months after her successful transplant. Hib vaccine is not recommended for adults with HIV infection.  Preventive Services / Frequency Ages 6 to 39years  Blood pressure check.** / Every 1 to 2 years.  Lipid and cholesterol check.** / Every 5 years beginning at age 39.  Clinical breast exam.** / Every 3 years for women in their 61s and 62s.  BRCA-related cancer risk assessment.** / For women who have family members with a BRCA-related cancer (breast, ovarian, tubal, or peritoneal cancers).  Pap test.** / Every 2 years from ages 47 through 85. Every 3 years starting at age 34 through age 12 or 74 with a history of 3 consecutive normal Pap tests.  HPV screening.** / Every 3 years from ages 46 through ages 43 to 54 with a history of 3 consecutive normal Pap tests.  Hepatitis C blood test.** / For any individual with known risks for hepatitis C.  Skin self-exam. / Monthly.  Influenza vaccine. / Every year.  Tetanus, diphtheria, and acellular pertussis (Tdap, Td) vaccine.** / Consult your health care provider. Pregnant women should receive 1 dose of Tdap vaccine during each pregnancy. 1 dose of Td every 10 years.  Varicella vaccine.** / Consult your health care provider. Pregnant females who do not have evidence of immunity should receive the first dose after pregnancy.  HPV vaccine. / 3 doses over 6 months, if 64 and younger. The vaccine is not recommended for use in  pregnant females. However, pregnancy testing is not needed before receiving a dose.  Measles, mumps, rubella (MMR) vaccine.** / You need at least 1 dose of MMR if you were born in 1957 or later. You may also need a 2nd dose. For females of childbearing age, rubella immunity should be determined. If there is no evidence of immunity, females who are not pregnant should be vaccinated. If there is no evidence of immunity, females who are pregnant should delay immunization until after pregnancy.  Pneumococcal 13-valent conjugate (PCV13) vaccine.** / Consult your health care provider.  Pneumococcal polysaccharide (PPSV23) vaccine.** / 1 to 2 doses if you smoke cigarettes or if you have certain conditions.  Meningococcal vaccine.** / 1 dose if you are age 71 to 36 years and a Market researcher living in a residence hall, or have one of several medical conditions, you need to get vaccinated against meningococcal disease. You may also need additional booster doses.  Hepatitis A vaccine.** / Consult your health care provider.  Hepatitis B vaccine.** / Consult your health care provider.  Haemophilus influenzae type b (Hib) vaccine.** / Consult your health care provider.  Ages 55 to 64years  Blood pressure check.** / Every 1 to 2 years.  Lipid and cholesterol check.** / Every 5 years beginning at age 20 years.  Lung cancer screening. / Every year if you are aged 55 80 years and have a 30-pack-year history of smoking and currently smoke or have quit within the past 15 years. Yearly screening is stopped once you have quit smoking for at least 15 years or develop a health problem that would prevent you from having lung cancer treatment.  Clinical breast exam.** / Every year after age 40 years.  BRCA-related cancer risk assessment.** / For women who have family members with a BRCA-related cancer (breast, ovarian, tubal, or peritoneal cancers).  Mammogram.** / Every year beginning at age 40  years and continuing for as long as you are in good health. Consult with your health care provider.  Pap test.** / Every 3 years starting at age 30 years through age 65 or 70 years with a history of 3 consecutive normal Pap tests.  HPV screening.** / Every 3 years from ages 30 years through ages 65 to 70 years with a history of 3 consecutive normal Pap tests.  Fecal occult blood test (FOBT) of stool. / Every year beginning at age 50 years and continuing until age 75 years. You may not need to do this test if you get a colonoscopy every 10 years.  Flexible sigmoidoscopy or colonoscopy.** / Every 5 years for a flexible sigmoidoscopy or every 10 years for a colonoscopy beginning at age 50 years and continuing until age 75 years.  Hepatitis C blood test.** / For all people born from 1945 through 1965 and any individual with known risks for hepatitis C.  Skin self-exam. / Monthly.  Influenza vaccine. / Every year.  Tetanus, diphtheria, and acellular pertussis (Tdap/Td) vaccine.** / Consult your health care provider. Pregnant women should receive 1 dose of Tdap vaccine during each pregnancy. 1 dose of Td every 10 years.  Varicella vaccine.** / Consult your health care provider. Pregnant females who do not have evidence of immunity should receive the first dose after pregnancy.  Zoster vaccine.** / 1 dose for adults aged 60 years or older.  Measles, mumps, rubella (MMR) vaccine.** / You need at least 1 dose of MMR if you were born in 1957 or later. You may also need a 2nd dose. For females of childbearing age, rubella immunity should be determined. If there is no evidence of immunity, females who are not pregnant should be vaccinated. If there is no evidence of immunity, females who are pregnant should delay immunization until after pregnancy.  Pneumococcal 13-valent conjugate (PCV13) vaccine.** / Consult your health care provider.  Pneumococcal polysaccharide (PPSV23) vaccine.** / 1 to 2 doses if  you smoke cigarettes or if you have certain conditions.  Meningococcal vaccine.** / Consult your health care provider.  Hepatitis A vaccine.** / Consult your health care provider.  Hepatitis B vaccine.** / Consult your health care provider.  Haemophilus influenzae type b (Hib) vaccine.** / Consult your health care provider.  Ages 65 years and over  Blood pressure check.** / Every 1 to 2 years.  Lipid and cholesterol check.** / Every 5 years beginning at age 20 years.  Lung cancer screening. / Every year if you are aged 55 80 years and have a 30-pack-year history of smoking and currently smoke or have quit within the past 15 years. Yearly screening is stopped once you have quit smoking for at least 15 years or develop a health problem that   would prevent you from having lung cancer treatment.  Clinical breast exam.** / Every year after age 103 years.  BRCA-related cancer risk assessment.** / For women who have family members with a BRCA-related cancer (breast, ovarian, tubal, or peritoneal cancers).  Mammogram.** / Every year beginning at age 36 years and continuing for as long as you are in good health. Consult with your health care provider.  Pap test.** / Every 3 years starting at age 5 years through age 85 or 10 years with 3 consecutive normal Pap tests. Testing can be stopped between 65 and 70 years with 3 consecutive normal Pap tests and no abnormal Pap or HPV tests in the past 10 years.  HPV screening.** / Every 3 years from ages 93 years through ages 70 or 45 years with a history of 3 consecutive normal Pap tests. Testing can be stopped between 65 and 70 years with 3 consecutive normal Pap tests and no abnormal Pap or HPV tests in the past 10 years.  Fecal occult blood test (FOBT) of stool. / Every year beginning at age 8 years and continuing until age 45 years. You may not need to do this test if you get a colonoscopy every 10 years.  Flexible sigmoidoscopy or colonoscopy.** /  Every 5 years for a flexible sigmoidoscopy or every 10 years for a colonoscopy beginning at age 69 years and continuing until age 68 years.  Hepatitis C blood test.** / For all people born from [birth year] through 1966 and any individual with known risks for hepatitis C.  Osteoporosis screening.** / A one-time screening for women ages 7 years and over and women at risk for fractures or osteoporosis.  Skin self-exam. / Monthly.  Influenza vaccine. / Every year.  Tetanus, diphtheria, and acellular pertussis (Tdap/Td) vaccine.** / 1 dose of Td every 10 years.  Varicella vaccine.** / Consult your health care provider.  Zoster vaccine.** / 1 dose for adults aged 5 years or older.  Pneumococcal 13-valent conjugate (PCV13) vaccine.** / Consult your health care provider.  Pneumococcal polysaccharide (PPSV23) vaccine.** / 1 dose for all adults aged 74 years and older.  Meningococcal vaccine.** / Consult your health care provider.  Hepatitis A vaccine.** / Consult your health care provider.  Hepatitis B vaccine.** / Consult your health care provider.  Haemophilus influenzae type b (Hib) vaccine.** / Consult your health care provider. ** Family history and personal history of risk and conditions may change your health care provider's recommendations. Document Released: 08/13/2001 Document Revised: 04/07/2013  [facility] Patient Information 2014 [place], [place].   EXERCISE AND DIET:  We recommended that you start or continue a regular exercise program for good health. Regular exercise means any activity that makes your heart beat faster and makes you sweat.  We recommend exercising at least 30 minutes per day at least 3 days a week, preferably 5.  We also recommend a diet low in fat and sugar / carbohydrates.  Inactivity, poor dietary choices and obesity can cause diabetes, heart attack, stroke, and kidney damage, among others.     ALCOHOL AND SMOKING:  Women should limit their alcohol intake to no  more than 7 drinks/beers/glasses of wine (combined, not each!) per week. Moderation of alcohol intake to this level decreases your risk of breast cancer and liver damage.  ( And of course, no recreational drugs are part of a healthy lifestyle.)  Also, you should not be smoking at all or even being exposed to second hand smoke. Most people know smoking can  cause cancer, and various heart and lung diseases, but did you know it also contributes to weakening of your bones?  Aging of your skin?  Yellowing of your teeth and nails?   CALCIUM AND VITAMIN D:  Adequate intake of calcium and Vitamin D are recommended.  The recommendations for exact amounts of these supplements seem to change often, but generally speaking 600 mg of calcium (either carbonate or citrate) and 800 units of Vitamin D per day seems prudent. Certain women may benefit from higher intake of Vitamin D.  If you are among these women, your doctor will have told you during your visit.     PAP SMEARS:  Pap smears, to check for cervical cancer or precancers,  have traditionally been done yearly, although recent scientific advances have shown that most women can have pap smears less often.  However, every woman still should have a physical exam from her gynecologist or primary care physician every year. It will include a breast check, inspection of the vulva and vagina to check for abnormal growths or skin changes, a visual exam of the cervix, and then an exam to evaluate the size and shape of the uterus and ovaries.  And after 49 years of age, a rectal exam is indicated to check for rectal cancers. We will also provide age appropriate advice regarding health maintenance, like when you should have certain vaccines, screening for sexually transmitted diseases, bone density testing, colonoscopy, mammograms, etc.    MAMMOGRAMS:  All women over 71 years old should have a yearly mammogram. Many facilities now offer a "3D" mammogram, which may cost  around $50 extra out of pocket. If possible,  we recommend you accept the option to have the 3D mammogram performed.  It both reduces the number of women who will be called back for extra views which then turn out to be normal, and it is better than the routine mammogram at detecting truly abnormal areas.     COLONOSCOPY:  Colonoscopy to screen for colon cancer is recommended for all women at age 52.  We know, you hate the idea of the prep.  We agree, BUT, having colon cancer and not knowing it is worse!!  Colon cancer so often starts as a polyp that can be seen and removed at colonscopy, which can quite literally save your life!  And if your first colonoscopy is normal and you have no family history of colon cancer, most women don't have to have it again for 10 years.  Once every ten years, you can do something that may end up saving your life, right?  We will be happy to help you get it scheduled when you are ready.  Be sure to check your insurance coverage so you understand how much it will cost.  It may be covered as a preventative service at no cost, but you should check your particular policy.

## 2019-06-28 NOTE — Progress Notes (Signed)
Impression and Recommendations:    1. Encounter for general adult medical examination with abnormal findings   2. Uncontrolled type 2 diabetes mellitus with hyperglycemia, without long-term current use of insulin (Lindsey Mason)   3. Hyperlipidemia associated with type 2 diabetes mellitus (Lindsey Mason)   4. Overweight (BMI 25.0-29.9)   5. Vitamin D deficiency   6. Encounter for screening mammogram for malignant neoplasm of breast   7. Diarrhea, unspecified type- chronic - has seen Gi in past but having continued sx despite txmnt plan   8. Class 1 obesity due to excess calories with serious comorbidity in adult, unspecified BMI     1. Female Physical  1) Anticipatory Guidance: Discussed importance of wearing a seatbelt while driving, not texting while driving; sunscreen when outside along with yearly skin surveillance; eating a well balanced and modest diet; physical activity at least 25 minutes per day or 150 min/ week of moderate to intense activity.   - Discussed prudent ear care using mixture of half hydrogen peroxide, half rubbing alcohol.  2) Immunizations / Screenings / Labs:   All immunizations and screenings that patient agrees to, are up-to-date per recommendations or will be updated today.  Patient understands the needs for q 57modental and yearly vision screens which pt will schedule independently. Obtain CBC, CMP, HgA1c, Lipid panel, TSH and vit D when fasting if not already done recently.   - Reviewed screenings, guidelines, and recommendations with patient today.   - Education provided and all questions answered.  - Per patient, last pap smear obtained in 12 of 2019. - Patient will continue to follow up with OBGYN as established.  - Last mammogram obtained in 2017.  - Immunizations and lab work up to date.  - Diabetic foot exam, diabetic eye exam, and urine micro albumin up to date.  3) Weight - Body mass index is 30.12 kg/m: Discussed goal of losing even 5-10% of current  body weight which would improve overall feelings of well being and improve objective health data significantly.   Improve nutrient density of diet through increasing intake of fruits and vegetables and decreasing saturated/trans fats, white flour products and refined sugar products.   Explained to patient what BMI refers to, and what it means medically.    Told patient to think about it as a "medical risk stratification measurement" and how increasing BMI is associated with increasing risk/ or worsening state of various diseases such as hypertension, hyperlipidemia, diabetes, premature OA, depression etc.  American Heart Association guidelines for healthy diet, basically Mediterranean diet, and exercise guidelines of 30 minutes 5 days per week or more discussed in detail.  Health counseling performed.  All questions answered.  4) Health Counseling & Preventative Maintenance: - Advised patient to continue working toward exercising to improve overall mental, physical, and emotional health.    - Reviewed the "spokes of the wheel" of mood and health management.  Stressed the importance of ongoing prudent habits, including regular exercise, appropriate sleep hygiene, healthful dietary habits, and prayer/meditation to relax.  - Encouraged patient to engage in daily physical activity, especially a formal exercise routine.  Recommended that the patient eventually strive for at least 150 minutes of moderate cardiovascular activity per week according to guidelines established by the [facility]   - Healthy dietary habits encouraged, including low-carb, and high amounts of lean protein in diet.   - Patient should also consume adequate amounts of water.    5) COVID-19 Counseling: - Novel Covid -19 counseling  done; all questions were answered.   - Current CDC / federal and [place] guidelines reviewed with patient  - Reminded pt of extreme importance of social distancing; wearing a mask when out in public;  insensate handwashing and cleaning of surfaces, avoiding unnecessary trips for shopping and avoiding ALL but emergency appts etc. - Told patient to be prepared, not scared; and be smart for the sake of others - Patient will call with any additional concerns    2. Chronic Concerns  Chronic Diarrhea - Reviewed patient's symptoms extensively during appointment today. - Per patient, she has seen GI in the past for these concerns, back when she suspected that Lindsey Mason / gallbladder issues were causing her symptoms.  - As GI symptoms continue, patient knows to return for consultation with GI.  STRONGLY encouraged patient to call GI as soon as possible to set up appointment.  - Will continue to monitor.   Diabetes Mellitus - Last A1c was measured at 6.2 on 05/04/2019. - After this measurement, patient called and stated her blood sugars were running in the 180's fasting.  Treatment plan was changed at that time and A1c re-checked today.    Recommendations - Return for OV near future if A1c abnormal / needing med change, otherwise in 3 months. - Will discuss resuming Lindsey Mason in future.   Orders Placed This Encounter  Procedures  . MM Digital Screening  . CBC w/Diff  . CMP (comprehensive metabolic panel)  . TSH  . T4, free  . Hemoglobin A1c  . Lipid panel  . VITAMIN D 25 Hydroxy (Vit-D Deficiency, Fractures)    Return for f/up for DM and chronic care near future if lab/ a1c abn, o/w in 70moif controlled.   Gross side effects, risk and benefits, and alternatives of medications discussed with patient.  Patient is aware that all medications have potential side effects and we are unable to predict every side effect or drug-drug interaction that may occur.  Expresses verbal understanding and consents to current therapy plan and treatment regimen.  F-up preventative CPE in 1 year- reminded pt again, this is in addition to any chronic care visits.    Please see orders placed and AVS  handed out to patient at the end of our visit for further patient instructions/ counseling done pertaining to today's office visit.  This document serves as a record of services personally performed by DMellody Dance DO. It was created on her behalf by KToni Amend a trained medical scribe. The creation of this record is based on the scribe's personal observations and the provider's statements to them.   This case required medical decision making of at least moderate complexity. The above documentation has been reviewed to be accurate and was completed by DMarjory Sneddon D.O.     Subjective:    I, KToni Amend am serving as scribe for Dr. DMellody Dance  Chief Complaint  Patient presents with  . Annual Exam   CC: BS/ DM concerns- sugars high and continues to have GI sx/concerns  HPI: Lindsey Tejadais a 48y.o. [facility].  Health Maintenance Summary Reviewed and updated, unless pt declines services.  Tobacco History Reviewed:  Y; smoked in the past, but for about 15 pack years at most. Alcohol:  No concerns, no excessive use. Exercise Habits:  Not meeting AHA guidelines. STD concerns:  None. Drug Use:  None. Birth control method:  Managed by OBGYN. Menses regular:  Managed by OBGYN. Lumps or breast concerns:   No. Breast Cancer Family History:  Mom did not have breast cancer, but patient notes "she had a lot of problems with calcium deposits."  - Female Health Last pap smear obtained December of 2019.  Notes she had an ovarian cyst removed August a year ago, and definitely had a pap smear before this as well.  Last mammogram obtained in 2017.  As far as she knows, it was WNL.  - Sexual Health Notes she has no sexual concerns.  - Eye Health Obtains eye exams yearly for her DM etc.  - Dental Health Has followed up recently.    - Digestion/  GI Sx Notes  after she eats in the morning, she has to run to the bathroom.  Per patient, her symptoms are so bad that she has to carry extra clothes with her "because when it hits her, there is nothing she can do."  Her gallbladder has been removed.  She suspected her gallbladder was part of the problem at first, along with historical management on Lindsey Mason.  States she went to GI for follow-up with her concerns, and was told that her tests came back showing nothing and she likely has irritable bowel.  She discontinued use of Lindsey Mason, which had worked great for her due to her suspecting GI symptoms were caused by it, however, symptoms have continued despite discontinuing this medication.   - Diabetes Mellitus, managed on insulin -  Patient requests A1c today because she states that her numbers are running rather high, up to 180- 160 fasting.   Notes she is often taking 20 units of NovoLog in addition to 20 units of Levemir.  States her readings at night after eating are also very high.     Immunization History  Administered Date(s) Administered  . Influenza Whole 04/11/2009  . Influenza, Seasonal, Injecte, Preservative Fre 05/27/2012  . Influenza,inj,Quad PF,6+ Mos 03/09/2014, 06/21/2015, 03/14/2016, 05/04/2019  . Influenza-Unspecified 04/22/2017  . Pneumococcal Conjugate-13 03/09/2014  . Pneumococcal Polysaccharide-23 04/11/2009  . Tdap 05/27/2012    Health Maintenance  Topic Date Due  . OPHTHALMOLOGY EXAM  12/10/2019  . HEMOGLOBIN A1C  12/27/2019  . FOOT EXAM  12/31/2019  . URINE MICROALBUMIN  02/04/2020  . TETANUS/TDAP  05/27/2022  . PAP SMEAR-Modifier  01/23/2023  . INFLUENZA VACCINE  Completed  . PNEUMOCOCCAL POLYSACCHARIDE VACCINE AGE 60-64 HIGH RISK  Completed  . HIV Screening  Completed     Wt Readings from Last 3 Encounters:  06/28/19 192 lb 4.8 oz (87.2 kg)  05/04/19 194 lb 3.2 oz (88.1 kg)  02/04/19 175 lb 4.8 oz (79.5 kg)   BP Readings from Last 3 Encounters:  06/28/19  127/75  05/04/19 129/83  02/04/19 112/75   Pulse Readings from Last 3 Encounters:  06/28/19 92  05/04/19 94  02/04/19 95     Past Medical History:  Diagnosis Date  . Clomid pregnancy 1997, 2005  . Depression   . Diabetes mellitus without mention of complication   . Esophageal reflux   . Headache(784.0)   . Infertility, female    PCOS - Clomid pregnancies   . Ovarian cyst 2019  . PCOS (polycystic ovarian syndrome) 05/27/2012  . PONV (postoperative nausea and vomiting)    nausea  . Pure hypercholesterolemia   . Rosacea   . Status post appendectomy 11/19/2017      Past Surgical History:  Procedure Laterality Date  . APPENDECTOMY    .  CESAREAN SECTION     x 3, last one with triplets  . CHOLECYSTECTOMY N/A 12/25/2018   Procedure: LAPAROSCOPIC CHOLECYSTECTOMY WITH INTRAOPERATIVE CHOLANGIOGRAM;  Surgeon: Jovita Kussmaul, MD;  Location: [facility];  Service: General;  Laterality: N/A;  . LAPAROSCOPIC BILATERAL SALPINGECTOMY Right 02/16/2018   Procedure: LAPAROSCOPIC RIGHT SALPINGECTOMY;  Surgeon: Megan Salon, MD;  Location: [facility];  Service: Gynecology;  Laterality: Right;  . TUBAL LIGATION        Family History  Problem Relation Age of Onset  . Cancer Mother        breast  . Heart disease Mother   . Stroke Mother   . Diabetes Mother   . Diabetes Father   . Breast cancer Maternal Aunt   . Colon cancer Neg Hx   . Inflammatory bowel disease Neg Hx   . Liver disease Neg Hx   . Pancreatic cancer Neg Hx   . Stomach cancer Neg Hx   . Esophageal cancer Neg Hx       Social History   Substance and Sexual Activity  Drug Use No  ,   Social History   Substance and Sexual Activity  Alcohol Use Yes   Comment: occ  ,   Social History   Tobacco Use  Smoking Status Former Smoker  . Packs/day: 1.00  . Years: 15.00  . Pack years: 15.00  . Types: Cigarettes  . Quit date: 03/04/2004  . Years since quitting: 15.3  Smokeless Tobacco Never Used  ,    Social History   Substance and Sexual Activity  Sexual Activity Yes  . Birth control/protection: Surgical   Comment: btl    Current Outpatient Medications on File Prior to Visit  Medication Sig Dispense Refill  . blood glucose meter kit and supplies Dispense based on patient and insurance preference. Use in the morning when fasting and 2 hours after largest meal. (FOR ICD-10 E10.9, E11.9). 1 each 0  . buPROPion (WELLBUTRIN XL) 300 MG 24 hr tablet TAKE 1 TABLET BY MOUTH ONCE DAILY . APPOINTMENT REQUIRED FOR FUTURE REFILLS 90 tablet 0  . Continuous Blood Gluc Receiver (DEXCOM G5 RECEIVER KIT) DEVI 1 kit by Does not apply route 2 (two) times daily. 1 Device 0  . Continuous Blood Gluc Sensor (DEXCOM G6 SENSOR) MISC 1 Device by Does not apply route 2 (two) times daily. 3 each 2  . fluticasone (FLONASE) 50 MCG/ACT nasal spray 1 spray each nostril after sinus rinse twice daily (Patient taking differently: Place 1 spray into both nostrils daily. ) 16 g 2  . ibuprofen (ADVIL) 200 MG tablet Take 400 mg by mouth every 6 (six) hours as needed for moderate pain.    [name] insulin detemir (LEVEMIR) 100 UNIT/ML injection INJECT 20 UNITS SUBCUTANEOUSLY EVERY 12 HOURS 10 mL 0  . metFORMIN (GLUCOPHAGE-XR) 500 MG 24 hr tablet Take 4 tablets (2,000 mg total) by mouth daily with breakfast. 360 tablet 1  . NOVOLOG FLEXPEN 100 UNIT/ML FlexPen INJECT 5 UNITS SUBCUTANEOUSLY THREE TIMES DAILY WITH MEALS PER SLIDING SCALE 15 mL 0  . ONETOUCH VERIO test strip USE IN THE MORNING WHEN FASTING AND 2 HOURS AFTER LARGEST MEAL 200 each 0  . Vitamin D, Ergocalciferol, (DRISDOL) 50000 units CAPS capsule Please take 1 tablet Wednesday and 1 tablet Sunday every week 24 capsule 3  . FLUoxetine (PROZAC) 20 MG tablet Take 1 tablet (20 mg total) by mouth daily. 90 tablet 1   No current facility-administered medications on file prior  to visit.    Allergies: Lantus [insulin glargine]  Review of Systems: General:   Denies fever,  chills, unexplained weight loss.  Optho/Auditory:   Denies visual changes, blurred vision/LOV Respiratory:   Denies SOB, DOE more than baseline levels.   Cardiovascular:   Denies chest pain, palpitations, new onset peripheral edema  Gastrointestinal:   Denies nausea, vomiting, diarrhea.  Genitourinary: Denies dysuria, freq/ urgency, flank pain or discharge from genitals.  Endocrine:     Denies hot or cold intolerance, polyuria, polydipsia. Musculoskeletal:   Denies unexplained myalgias, joint swelling, unexplained arthralgias, gait problems.  Skin:  Denies rash, suspicious lesions Neurological:     Denies dizziness, unexplained weakness, numbness  Psychiatric/Behavioral:   Denies mood changes, suicidal or homicidal ideations, hallucinations    Objective:    Blood pressure 127/75, pulse 92, temperature 98.5 F (36.9 C), temperature source Oral, resp. rate 10, height '5\' 7"'  (1.702 m), weight 192 lb 4.8 oz (87.2 kg), SpO2 97 %. Body mass index is 30.12 kg/m. General Appearance:    Alert, cooperative, no distress, appears stated age  Head:    Normocephalic, without obvious abnormality, atraumatic  Eyes:    PERRL, conjunctiva/corneas clear, EOM's intact, fundi    benign, both eyes  Ears:    Normal TM's and external ear canals, both ears  Nose:   Nares normal, septum midline, mucosa normal, no drainage    or sinus tenderness  Throat:   Lips w/o lesion, mucosa moist, and tongue normal; teeth and   gums normal  Neck:   Supple, symmetrical, trachea midline, no adenopathy;    thyroid:  no enlargement/tenderness/nodules; no carotid   bruit or JVD  Back:     Symmetric, no curvature, ROM normal, no CVA tenderness  Lungs:     Clear to auscultation bilaterally, respirations unlabored, no       Wh/ R/ R  Chest Wall:    No tenderness or gross deformity; normal excursion   Heart:    Regular rate and rhythm, S1 and S2 normal, no murmur, rub   or gallop  Breast Exam:    Deferred to OBGYN.  Abdomen:      Soft, non-tender, bowel sounds active all four quadrants, NO   G/R/R, no masses, no organomegaly  Genitalia:    Deferred to OBGYN.  Rectal:    Deferred by pt.  Extremities:   Extremities normal, atraumatic, no cyanosis or gross edema  Pulses:   2+ and symmetric all extremities  Skin:   Warm, dry, Skin color, texture, turgor normal, no obvious rashes or lesions Psych: No HI/SI, judgement and insight good, Euthymic mood. Full Affect.  Neurologic:   CNII-XII intact, normal strength, sensation and reflexes    Throughout

## 2019-07-01 LAB — HEMOGLOBIN A1C
Est. average glucose Bld gHb Est-mCnc: 206 mg/dL
Hgb A1c MFr Bld: 8.8 % — ABNORMAL HIGH (ref 4.8–5.6)

## 2019-07-01 LAB — CBC WITH DIFFERENTIAL/PLATELET
Basophils Absolute: 0.1 10*3/uL (ref 0.0–0.2)
Basos: 1 %
EOS (ABSOLUTE): 0.2 10*3/uL (ref 0.0–0.4)
Eos: 2 %
Hematocrit: 41.6 % (ref 34.0–46.6)
Hemoglobin: 13.8 g/dL (ref 11.1–15.9)
Immature Grans (Abs): 0.1 10*3/uL (ref 0.0–0.1)
Immature Granulocytes: 1 %
Lymphocytes Absolute: 2.8 10*3/uL (ref 0.7–3.1)
Lymphs: 28 %
MCH: 28.7 pg (ref 26.6–33.0)
MCHC: 33.2 g/dL (ref 31.5–35.7)
MCV: 87 fL (ref 79–97)
Monocytes Absolute: 0.6 10*3/uL (ref 0.1–0.9)
Monocytes: 6 %
Neutrophils Absolute: 6.2 10*3/uL (ref 1.4–7.0)
Neutrophils: 62 %
Platelets: 285 10*3/uL (ref 150–450)
RBC: 4.81 x10E6/uL (ref 3.77–5.28)
RDW: 12.9 % (ref 11.7–15.4)
WBC: 10.1 10*3/uL (ref 3.4–10.8)

## 2019-07-01 LAB — LIPID PANEL
Chol/HDL Ratio: 3.5 ratio (ref 0.0–4.4)
Cholesterol, Total: 179 mg/dL (ref 100–199)
HDL: 51 mg/dL (ref >39)
LDL Chol Calc (NIH): 97 mg/dL (ref 0–99)
Triglycerides: 179 mg/dL — ABNORMAL HIGH (ref 0–149)
VLDL Cholesterol Cal: 31 mg/dL (ref 5–40)

## 2019-07-01 LAB — COMPREHENSIVE METABOLIC PANEL
ALT: 22 IU/L (ref 0–32)
AST: 13 IU/L (ref 0–40)
Albumin/Globulin Ratio: 1.8 (ref 1.2–2.2)
Albumin: 4.2 g/dL (ref 3.8–4.8)
Alkaline Phosphatase: 82 IU/L (ref 39–117)
BUN/Creatinine Ratio: 25 — ABNORMAL HIGH (ref 9–23)
BUN: 13 mg/dL (ref 6–24)
Bilirubin Total: 0.4 mg/dL (ref 0.0–1.2)
CO2: 21 mmol/L (ref 20–29)
Calcium: 9.2 mg/dL (ref 8.7–10.2)
Chloride: 104 mmol/L (ref 96–106)
Creatinine, Ser: 0.51 mg/dL — ABNORMAL LOW (ref 0.57–1.00)
GFR calc Af Amer: 131 mL/min/{1.73_m2} (ref >59)
GFR calc non Af Amer: 113 mL/min/{1.73_m2} (ref >59)
Globulin, Total: 2.3 g/dL (ref 1.5–4.5)
Glucose: 232 mg/dL — ABNORMAL HIGH (ref 65–99)
Potassium: 4.7 mmol/L (ref 3.5–5.2)
Sodium: 143 mmol/L (ref 134–144)
Total Protein: 6.5 g/dL (ref 6.0–8.5)

## 2019-07-01 LAB — TSH: TSH: 1.17 u[IU]/mL (ref 0.450–4.500)

## 2019-07-01 LAB — T4, FREE: Free T4: 1.2 ng/dL (ref 0.82–1.77)

## 2019-07-01 LAB — VITAMIN D 25 HYDROXY (VIT D DEFICIENCY, FRACTURES): Vit D, 25-Hydroxy: 8.1 ng/mL — ABNORMAL LOW (ref 30.0–100.0)

## 2019-07-04 ENCOUNTER — Other Ambulatory Visit: Payer: Self-pay | Admitting: Family Medicine

## 2019-07-04 DIAGNOSIS — E1165 Type 2 diabetes mellitus with hyperglycemia: Secondary | ICD-10-CM

## 2019-07-06 ENCOUNTER — Other Ambulatory Visit: Payer: Self-pay

## 2019-07-06 ENCOUNTER — Ambulatory Visit
Admission: RE | Admit: 2019-07-06 | Discharge: 2019-07-06 | Disposition: A | Discharge status: Home / Self Care | Payer: BC Managed Care – PPO | Source: Ambulatory Visit

## 2019-07-06 ENCOUNTER — Other Ambulatory Visit: Payer: Self-pay | Admitting: Family Medicine

## 2019-07-06 DIAGNOSIS — Z1231 Encounter for screening mammogram for malignant neoplasm of breast: Secondary | ICD-10-CM

## 2019-07-08 ENCOUNTER — Encounter: Payer: Self-pay | Admitting: Family Medicine

## 2019-07-13 ENCOUNTER — Encounter: Payer: Self-pay | Admitting: Family Medicine

## 2019-07-13 ENCOUNTER — Other Ambulatory Visit: Payer: Self-pay

## 2019-07-13 ENCOUNTER — Ambulatory Visit (INDEPENDENT_AMBULATORY_CARE_PROVIDER_SITE_OTHER): Payer: BC Managed Care – PPO | Admitting: Family Medicine

## 2019-07-13 VITALS — Ht 66.0 in | Wt 190.0 lb

## 2019-07-13 DIAGNOSIS — E1169 Type 2 diabetes mellitus with other specified complication: Secondary | ICD-10-CM

## 2019-07-13 DIAGNOSIS — E559 Vitamin D deficiency, unspecified: Secondary | ICD-10-CM | POA: Diagnosis not present

## 2019-07-13 DIAGNOSIS — E1165 Type 2 diabetes mellitus with hyperglycemia: Secondary | ICD-10-CM

## 2019-07-13 DIAGNOSIS — E785 Hyperlipidemia, unspecified: Secondary | ICD-10-CM

## 2019-07-13 DIAGNOSIS — F331 Major depressive disorder, recurrent, moderate: Secondary | ICD-10-CM

## 2019-07-13 MED ORDER — VITAMIN D (ERGOCALCIFEROL) 1.25 MG (50000 UNIT) PO CAPS: ORAL_CAPSULE | ORAL | 3 refills | Status: DC

## 2019-07-13 MED ORDER — ROSUVASTATIN CALCIUM 5 MG PO TABS: 5.0000 mg | ORAL_TABLET | Freq: Every day | ORAL | 1 refills | Status: DC

## 2019-07-13 MED ORDER — FLUOXETINE HCL 20 MG PO TABS: 10.0000 mg | ORAL_TABLET | Freq: Every day | ORAL | 1 refills | Status: DC

## 2019-07-13 MED ORDER — OZEMPIC (0.25 OR 0.5 MG/DOSE) 2 MG/1.5ML ~~LOC~~ SOPN: PEN_INJECTOR | SUBCUTANEOUS | 0 refills | Status: DC

## 2019-07-13 NOTE — Progress Notes (Signed)
Virtual / live video office visit note for [employer], D.O- Primary Care Physician at [facility]   I connected with current patient today and beyond visually recognizing the correct individual, I verified that I am speaking with the correct person using two identifiers.  . Location of the patient: Home . Location of the provider: Office Only the patient (+/- their family members at pt's discretion) and myself were participating in the encounter    - This visit type was conducted due to national recommendations for restrictions regarding the COVID-19 Pandemic (e.g. social distancing) in an effort to limit this patient's exposure and mitigate transmission in our community.  This format is felt to be most appropriate for this patient at this time.   - The patient did have access to video technology today  - No physical exam could be performed with this format, beyond that communicated to [place] by the patient/ family members as noted.   - Additionally my office staff/ schedulers discussed with the patient that there may be a monetary charge related to this service, depending on patient's medical insurance.   The patient expressed understanding, and agreed to proceed.      History of Present Illness: Hyperlipidemia and Diabetes    I, Toni Amend, am serving as scribe for Dr. Mellody Dance.   Patient continues having GI distress as previously reported, and notes at this point she isn't sure if her symptoms were ever related to management on Ozempic - she is very doubtful that was the issue.   She has seen GI in past for this and was told to f/up with them and hasn't yet   - Vitamin D Deficiency Notes she "just forgets to take it."   - Depression Regarding her mood, "it's not bad."  She's hoping to eventually come off the Prozac and stay on the Wellbutrin; notes "I think I'm in a much better place."   HPI:   Diabetes Mellitus:  Home glucose readings:  none   -  Patient reports good compliance with therapy plan: medication and/or lifestyle modification  - Her denies acute concerns or problems related to treatment plan  - She denies new concerns.  Denies polyuria/polydipsia, hypo/ hyperglycemia symptoms.  Denies new onset of: chest pain, exercise intolerance, shortness of breath, dizziness, visual changes, headache, lower extremity swelling or claudication.   Last A1C in the office was:  Lab Results  Component Value Date   HGBA1C 8.8 (H) 06/28/2019   HGBA1C 6.2 (A) 05/04/2019   HGBA1C 8.4 (A) 02/04/2019   Lab Results  Component Value Date   MICROALBUR 10 02/04/2019   LDLCALC 97 06/28/2019   CREATININE 0.51 (L) 06/28/2019   BP Readings from Last 3 Encounters:  06/28/19 127/75  05/04/19 129/83  02/04/19 112/75   Wt Readings from Last 3 Encounters:  07/13/19 190 lb (86.2 kg)  06/28/19 192 lb 4.8 oz (87.2 kg)  05/04/19 194 lb 3.2 oz (88.1 kg)    HPI:  Hyperlipidemia:  49 y.o. female here for cholesterol follow-up.   Notes her main fear regarding starting statin is possible side effects.  - She denies new onset of: myalgias, arthralgias, increased fatigue more than normal, chest pains, exercise intolerance, shortness of breath, dizziness, visual changes, headache, lower extremity swelling or claudication.   Most recent cholesterol panel was:  Lab Results  Component Value Date   CHOL 179 06/28/2019   HDL 51 06/28/2019   LDLCALC 97 06/28/2019   LDLDIRECT 104.2  03/09/2014   TRIG 179 (H) 06/28/2019   CHOLHDL 3.5 06/28/2019   Hepatic Function Latest Ref Rng & Units 06/28/2019 12/31/2018 12/24/2018  Total Protein 6.0 - 8.5 g/dL 6.5 6.3 6.1(L)  Albumin 3.8 - 4.8 g/dL 4.2 4.5 3.4(L)  AST 0 - 40 IU/L 13 13 9(L)  ALT 0 - 32 IU/L '22 23 15  ' Alk Phosphatase 39 - 117 IU/L 82 72 54  Total Bilirubin 0.0 - 1.2 mg/dL 0.4 0.6 1.0  Bilirubin, Direct 0.0 - 0.3 mg/dL - - -      Depression screen [facility] 2/9 07/13/2019 06/28/2019 05/04/2019 02/04/2019  12/31/2018  Decreased Interest 0 0 0 0 0  Down, Depressed, Hopeless 0 0 0 0 0  PHQ - 2 Score 0 0 0 0 0  Altered sleeping '1 2 1 1 1  ' Tired, decreased energy '1 2 2 2 1  ' Change in appetite 0 0 '2 1 2  ' Feeling bad or failure about yourself  0 0 1 0 0  Trouble concentrating 0 0 0 2 0  Moving slowly or fidgety/restless 0 0 0 0 0  Suicidal thoughts 0 0 0 0 0  PHQ-9 Score '2 4 6 6 4  ' Difficult doing work/chores Not difficult at all Not difficult at all Not difficult at all Somewhat difficult Not difficult at all  Some recent data might be hidden    GAD 7 : Generalized Anxiety Score 02/04/2019 05/05/2018  Nervous, Anxious, on Edge 1 3  Control/stop worrying 1 3  Worry too much - different things 0 3  Trouble relaxing 1 2  Restless 1 3  Easily annoyed or irritable 0 2  Afraid - awful might happen 0 2  Total GAD 7 Score 4 18  Anxiety Difficulty Not difficult at all Very difficult     Impression and Recommendations:    1. Poorly controlled type 2 diabetes mellitus ([place])- on insulin now for mgt   2. Uncontrolled type 2 diabetes mellitus with hyperglycemia ([place])   3. Hyperlipidemia associated with type 2 diabetes mellitus ([place])   4. Moderate episode of recurrent major depressive disorder ([place])   5. Vitamin D deficiency     - Reviewed recent lab work (06/28/2019) in depth with patient today.  All lab work within normal limits unless otherwise noted.  Extensive education provided and all questions answered.   - To help preserve renal and health of all organs, advised patient to consume plenty of water, engage in daily physical activity, avoid nephrotoxic and hepatotoxic substances, and work on achieving/mainting control over blood sugar and blood pressure  - Advised pt f/up with her GI doc for any ongoing chronic stomach issues   Poorly Controlled Type 2 Diabetes Mellitus - on insulin now for management  - A1c most recently worsened, unstable.  - A1c up to 8.8 from 6.2 prior. - Managed on  NovoLog and Levemir.   - Discussed goal A1c of under 7.0.  - Recommended resuming Ozempic today.  Discussed that this should help with control of blood sugars, aid with weight loss, and promote reduction of appetite etc. - Last refill of Ozempic provided 12/08/2018.  - Begin Ozempic, slowly weaning dose up over several weeks to avoid GI s-e.   - For first two weeks, start with dosage at 0.25.[name]  See med list. - If patient finds that her GI symptoms worsen, she knows to remain on lower dose until GI symptoms improve, and increase dose slowly as tolerated.  - Patient knows to keep  in touch closely on MyChart while medications are adjusted.  - Counseled patient on pathophysiology of disease and discussed various treatment options, which always includes dietary and lifestyle modification as first line.    - Importance of low carb, heart-healthy diet discussed with patient in addition to regular aerobic exercise of 25mn 5d/week or more.   - Check FBS and 2 hours after the biggest meal of your day.  Keep log and bring in next OV for my review.     - Also told patient if you ever feel poorly, please check your blood pressure and blood sugar, as one or the other could be the cause of your symptoms.  - Pt reminded about need for yearly eye and foot exams.  Told patient to make appt.for diabetic eye exam, CMAs here will do foot exams  - We will continue to monitor and re-check as discussed.   Hyperlipidemia associated with DM - Last FLP obtained two weeks ago: Triglycerides = 179, elevated, up from 158. HDL = 51, up from 43 prior. LDL = 97.  - Discussed goal LDL of 70 or less in diabetics. - Reviewed that patient's triglycerides are elevated and LDL suboptimally controlled, esp being a diabetic! - Patient agrees to begin statin management today.   See med list.  - To help reduce incidence of side effects on statin, advised patient to hydrate adequately and engage in daily physical activity,  ideally a formal exercise routine.  - Encouraged ongoing prudent dietary changes such as low saturated & trans fat diets for hyperlipidemia and low carb diets for hypertriglyceridemia.  - To help improve HDL, ideally above 60, encouraged patient to follow AHA guidelines for regular exercise and also engage in weight loss if BMI above 25.   - Educational handouts provided at patient's desire and/ or told to look online at the [place] for further information.  - We will continue to monitor and re-check as discussed.   Vitamin D Deficiency - Last Vitamin D 8.1, low, down from 11.8 one year prior. - Discussed need for management in 40-60 range. - pt HAS NOT BEEN taking her Vit D - Encouraged patient to resume supplementation as prescribed, Wednesday and Sunday each week.  To help prevent forgetfulness, advised patient to obtain a pill box and set her pills up each Sunday.  - Will continue to monitor and re-check as discussed.   Moderate Episode of Recurrent MDD - Stable at this time. - Decreasing Prozac today. - Reduce dose of Prozac to 10 mg from 20 mg; take a half tablet daily. - Will continue to monitor closely.   Health Counseling & Preventative Maintenance - Advised patient to continue working toward exercising and prudent weight loss to improve overall mental, physical, and emotional health.    - Reviewed the "spokes of the wheel" of mood and health management.  Stressed the importance of ongoing prudent habits, including regular exercise, appropriate sleep hygiene, healthful dietary habits, and prayer/meditation to relax.  - Encouraged patient to engage in daily physical activity as tolerated, especially a formal exercise routine.  Recommended that the patient eventually strive for at least 150 minutes of moderate cardiovascular activity per week according to guidelines established by the [facility]   - Healthy dietary habits encouraged, including low-carb,  and high amounts of lean protein in diet.   - Patient should also consume adequate amounts of water.  - Health counseling performed.  All questions answered.   Recommendations - Adding Ozempic today;  will need a repeat A1c in 3 months prior to follow-up appointment. - Adding Crestor today; will check ALT prior to follow-up appointment in one month,  AND WILL ALSO  need FLP in addition to Vit D (after resuming Vit D) and A1c in 3 months, prior to next follow-up OV in 46mo - Reduce dose of prozac today   - Novel Covid -19 counseling done; all questions were answered.    - Current CDC / federal and [place] guidelines reviewed with patient regarding testing and/ or vaccination  -Recommend patient go on MyChart or the [facility] website for further directions regarding vaccination.   Patient informed to please refer to the [place] and/or the county health website in which they live for further information  - Reminded pt of extreme importance of continuing the 3 W's: maintaining social distancing; wearing a mask when out in public or with folks other than immediate household; insensate handwashing and cleaning of surfaces, avoiding unnecessary trips for shopping, avoiding restaurants/bars and try to make as many medical appointments as possible via video or telehealth  - Patient will contact [place] any additional concerns   - As part of my medical decision making, I reviewed the following data within the [place] obtained from pt /family, CMA notes reviewed and incorporated if applicable, Labs reviewed, Radiograph/ tests reviewed if applicable and OV notes from prior OV's with me, as well as other specialists she/he has seen since seeing me last, were all reviewed and used in my medical decision making process today.   - Additionally, discussion had with patient regarding txmnt plan, their biases about that  plan etc were used in my medical decision making today.   - The patient agreed with the plan and demonstrated an understanding of the instructions.   No barriers to understanding were identified.     Return 4-6wks telehealth for add ozempic, crestor & dec prozac with ALT 3 d prior, for ALSO 3 mo for FLP, A1c, Vit D, w/ OV 3-5 days later.    Orders Placed This Encounter  Procedures  . ALT  . Hemoglobin A1c  . Lipid panel  . VITAMIN D 25 Hydroxy (Vit-D Deficiency, Fractures)    Meds ordered this encounter  Medications  . Semaglutide,0.25 or 0.5MG/DOS, (OZEMPIC, 0.25 OR 0.5 MG/DOSE,) 2 MG/1.5ML SOPN    Sig: Inject 0.25 mg into the skin once a week for 14 days, THEN 0.5 mg once a week for 21 days.    Dispense:  1 pen    Refill:  0  . rosuvastatin (CRESTOR) 5 MG tablet    Sig: Take 1 tablet (5 mg total) by mouth at bedtime.    Dispense:  90 tablet    Refill:  1  . Vitamin D, Ergocalciferol, (DRISDOL) 1.25 MG (50000 UNIT) CAPS capsule    Sig: Please take 1 tablet Wednesday and 1 tablet Sunday every week    Dispense:  24 capsule    Refill:  3  . FLUoxetine (PROZAC) 20 MG tablet    Sig: Take 0.5 tablets (10 mg total) by mouth daily.    Dispense:  90 tablet    Refill:  1    Medications Discontinued During This Encounter  Medication Reason  . Vitamin D, Ergocalciferol, (DRISDOL) 50000 units CAPS capsule Reorder  . FLUoxetine (PROZAC) 20 MG tablet       Note:  This note was prepared with assistance of Dragon  voice recognition software. Occasional wrong-word or sound-a-like substitutions may have occurred due to the inherent limitations of voice recognition software.  This document serves as a record of services personally performed by Mellody Dance, DO. It was created on her behalf by Toni Amend, a trained medical scribe. The creation of this record is based on the scribe's personal observations and the provider's statements to them.   This case required medical decision  making of at least moderate complexity. The above documentation has been reviewed to be accurate and was completed by Marjory Sneddon, D.O.        Patient Care Team    Relationship Specialty Notifications Start End  Mellody Dance, DO PCP - General Family Medicine  08/14/17   Marin Comment, My [place], [place] Referring Physician Optometry  08/04/17   Mansouraty, Telford Nab., MD Consulting Physician Gastroenterology  03/17/18   [facility]    02/16/19   Daryll Brod, MD Consulting Physician Orthopedic Surgery  05/04/19   Megan Salon, MD Consulting Physician Gynecology  07/23/19     -Vitals obtained; medications/ allergies reconciled;  personal medical, social, Sx etc.histories were updated by CMA, reviewed by me and are reflected in chart  Patient Active Problem List   Diagnosis Date Noted  . Major depressive disorder, recurrent episode ([place]) 08/20/2010  . Hyperlipidemia associated with type 2 diabetes mellitus ([place]) 04/11/2009  . Poorly controlled type 2 diabetes mellitus ([place])- on insulin now for [facility] 03/08/2009  . Vitamin D deficiency 08/26/2017  . Nausea and vomiting 12/24/2018  . Nausea & vomiting 12/24/2018  . Generalized abdominal pain 12/24/2018  . Statin declined 05/05/2018  . Bloating 03/10/2018  . Alternating constipation and diarrhea 03/10/2018  . Pyrosis 03/10/2018  . Abnormal liver ultrasound 03/10/2018  . Calculus of gallbladder without cholecystitis without obstruction 03/10/2018  . h/o Enlarged ovaries 11/20/2017  . Environmental and seasonal allergies 11/04/2017  . Psychophysiological insomnia 11/04/2017  . Depression 09/03/2016  . Overweight (BMI 25.0-29.9) 09/03/2016  . Witnessed episode of apnea 09/03/2016  . Urinary frequency 04/03/2016  . Acute maxillary sinusitis 11/04/2014  . 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 PCOS (polycystic ovarian syndrome) 05/27/2012  . 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 GERD  03/08/2009     Current Meds  Medication Sig  . blood glucose meter kit and supplies Dispense based on patient and insurance preference. Use in the morning when fasting and 2 hours after largest meal. (FOR ICD-10 E10.9, E11.9).  [name] buPROPion (WELLBUTRIN XL) 300 MG 24 hr tablet TAKE 1 TABLET BY MOUTH ONCE DAILY . APPOINTMENT REQUIRED FOR FUTURE REFILLS  . Continuous Blood Gluc Receiver (DEXCOM G5 RECEIVER KIT) DEVI 1 kit by Does not apply route 2 (two) times daily.  . Continuous Blood Gluc Sensor (DEXCOM G6 SENSOR) MISC 1 Device by Does not apply route 2 (two) times daily.  [name] FLUoxetine (PROZAC) 20 MG tablet Take 0.5 tablets (10 mg total) by mouth daily.  . fluticasone (FLONASE) 50 MCG/ACT nasal spray 1 spray each nostril after sinus rinse twice daily (Patient taking differently: Place 1 spray into both nostrils daily. )  . ibuprofen (ADVIL) 200 MG tablet Take 400 mg by mouth every 6 (six) hours as needed for moderate pain.  [name] insulin detemir (LEVEMIR) 100 UNIT/ML injection INJECT 20 UNITS SUBCUTANEOUSLY EVERY 12 HOURS  . metFORMIN (GLUCOPHAGE-XR) 500 MG 24 hr tablet Take 4 tablets (2,000 mg total) by mouth daily with breakfast.  . NOVOLOG FLEXPEN 100  UNIT/ML FlexPen INJECT 5 UNITS SUBCUTANEOUSLY THREE TIMES DAILY WITH MEALS PER SLIDING SCALE  . ONETOUCH VERIO test strip USE IN THE MORNING WHEN FASTING AND 2 HOURS AFTER LARGEST MEAL  . Vitamin D, Ergocalciferol, (DRISDOL) 1.25 MG (50000 UNIT) CAPS capsule Please take 1 tablet Wednesday and 1 tablet Sunday every week  . [DISCONTINUED] FLUoxetine (PROZAC) 20 MG tablet Take 1 tablet (20 mg total) by mouth daily.  . [DISCONTINUED] Vitamin D, Ergocalciferol, (DRISDOL) 50000 units CAPS capsule Please take 1 tablet Wednesday and 1 tablet Sunday every week     Allergies  Allergen Reactions  . Lantus [Insulin Glargine] Other (See Comments)    headaches     ROS:  See above HPI for pertinent positives and negatives   Objective:   Height '5\' 6"'   (1.676 m), weight 190 lb (86.2 kg).  (if some vitals are omitted, this means that patient was UNABLE to obtain them even though they were asked to get them prior to OV today.  They were asked to call us at their earliest convenience with these once obtained.)  General: A & O * 3; visually in no acute distress; in usual state of health.  Skin: Visible skin appears normal and pt's usual skin color HEENT:  EOMI, head is normocephalic and atraumatic.  Sclera are anicteric. Neck has a good range of motion.  Lips are noncyanotic Chest: normal chest excursion and movement Respiratory: speaking in full sentences, no conversational dyspnea; no use of accessory muscles Psych: insight good, mood- appears full

## 2019-07-23 ENCOUNTER — Encounter: Payer: Self-pay | Admitting: Family Medicine

## 2019-08-10 ENCOUNTER — Other Ambulatory Visit: Payer: Self-pay | Admitting: Family Medicine

## 2019-08-10 ENCOUNTER — Encounter: Payer: Self-pay | Admitting: Family Medicine

## 2019-08-10 DIAGNOSIS — F331 Major depressive disorder, recurrent, moderate: Secondary | ICD-10-CM

## 2019-08-10 DIAGNOSIS — E1165 Type 2 diabetes mellitus with hyperglycemia: Secondary | ICD-10-CM

## 2019-08-10 MED ORDER — NOVOLOG FLEXPEN 100 UNIT/ML ~~LOC~~ SOPN: 5.0000 [IU] | PEN_INJECTOR | Freq: Three times a day (TID) | SUBCUTANEOUS | 0 refills | Status: DC

## 2019-08-10 MED ORDER — BUPROPION HCL ER (XL) 300 MG PO TB24: 300.0000 mg | ORAL_TABLET | Freq: Every day | ORAL | 0 refills | Status: DC

## 2019-08-10 MED ORDER — FLUOXETINE HCL 20 MG PO TABS: 10.0000 mg | ORAL_TABLET | Freq: Every day | ORAL | 1 refills | Status: DC

## 2019-08-10 NOTE — Telephone Encounter (Signed)
Patient requesting this Levemir be changed to pen. AS, CMA

## 2019-08-10 NOTE — Telephone Encounter (Signed)
This is a repeat of same request I believe Thnx,  Dr Jenetta Downer

## 2019-08-10 NOTE — Telephone Encounter (Signed)
Sure can.  No chang ein dose needed,  but may require a new script.  Thnx!!

## 2019-08-11 ENCOUNTER — Other Ambulatory Visit: Payer: Self-pay | Admitting: Family Medicine

## 2019-08-11 ENCOUNTER — Encounter: Payer: Self-pay | Admitting: Family Medicine

## 2019-08-11 MED ORDER — INSULIN DETEMIR 100 UNIT/ML FLEXPEN: 20.0000 [IU] | PEN_INJECTOR | Freq: Two times a day (BID) | SUBCUTANEOUS | 2 refills | Status: DC

## 2019-08-11 NOTE — Telephone Encounter (Signed)
Please review new script and authorize if appropriate.  AS, CMA

## 2019-08-11 NOTE — Progress Notes (Unsigned)
insulin

## 2019-08-27 ENCOUNTER — Other Ambulatory Visit: Payer: Self-pay | Admitting: Family Medicine

## 2019-08-27 DIAGNOSIS — E1165 Type 2 diabetes mellitus with hyperglycemia: Secondary | ICD-10-CM

## 2019-08-28 ENCOUNTER — Ambulatory Visit: Payer: BC Managed Care – PPO | Attending: Internal Medicine

## 2019-08-28 DIAGNOSIS — Z23 Encounter for immunization: Secondary | ICD-10-CM

## 2019-08-28 NOTE — Progress Notes (Signed)
   Covid-19 Vaccination Clinic  Name:  Lindsey Mason    MRN: [id number] DOB: 1971-06-09  08/28/2019  Ms. Farm was observed post Covid-19 immunization for 15 minutes without incidence. She was provided with Vaccine Information Sheet and instruction to access the V-Safe system.   [name] was instructed to call 911 with any severe reactions post vaccine: [name] Difficulty breathing  . Swelling of your face and throat  . A fast heartbeat  . A bad rash all over your body  . Dizziness and weakness    Immunizations Administered    Name Date Dose VIS Date Route   Pfizer COVID-19 Vaccine 08/28/2019  5:53 PM 0.3 mL 06/11/2019 Intramuscular   Manufacturer: [place]   Lot: [id number]   [place]: [id number]

## 2019-09-14 ENCOUNTER — Telehealth: Payer: Self-pay | Admitting: Family Medicine

## 2019-09-14 NOTE — Telephone Encounter (Signed)
Pt's last pap 2019 & Mammo 2021 are in pt's chart.  --FYI to med asst.  --glh

## 2019-09-18 ENCOUNTER — Ambulatory Visit: Payer: BC Managed Care – PPO | Attending: Internal Medicine

## 2019-09-18 DIAGNOSIS — Z23 Encounter for immunization: Secondary | ICD-10-CM

## 2019-09-18 NOTE — Progress Notes (Signed)
   Covid-19 Vaccination Clinic  Name:  Lindsey Mason    MRN: [id number] DOB: 1971/06/27  09/18/2019  [name] was observed post Covid-19 immunization for 15 minutes without incident. She was provided with Vaccine Information Sheet and instruction to access the V-Safe system.   [name] was instructed to call 911 with any severe reactions post vaccine: [name] Difficulty breathing  . Swelling of face and throat  . A fast heartbeat  . A bad rash all over body  . Dizziness and weakness   Immunizations Administered    Name Date Dose VIS Date Route   Pfizer COVID-19 Vaccine 09/18/2019  9:33 AM 0.3 mL 06/11/2019 Intramuscular   Manufacturer: [place]   Lot: [id number]   [place]: [id number]

## 2019-09-20 ENCOUNTER — Encounter: Payer: Self-pay | Admitting: Certified Nurse Midwife

## 2019-10-11 ENCOUNTER — Encounter: Payer: Self-pay | Admitting: Family Medicine

## 2019-10-20 ENCOUNTER — Other Ambulatory Visit: Payer: Self-pay | Admitting: Family Medicine

## 2019-10-20 DIAGNOSIS — E1165 Type 2 diabetes mellitus with hyperglycemia: Secondary | ICD-10-CM

## 2019-12-12 ENCOUNTER — Other Ambulatory Visit: Payer: Self-pay | Admitting: Family Medicine

## 2019-12-12 DIAGNOSIS — E1165 Type 2 diabetes mellitus with hyperglycemia: Secondary | ICD-10-CM

## 2019-12-15 ENCOUNTER — Other Ambulatory Visit: Payer: Self-pay | Admitting: Physician Assistant

## 2019-12-15 DIAGNOSIS — E1165 Type 2 diabetes mellitus with hyperglycemia: Secondary | ICD-10-CM

## 2019-12-15 MED ORDER — METFORMIN HCL ER 500 MG PO TB24: 2000.0000 mg | ORAL_TABLET | Freq: Every day | ORAL | 0 refills | Status: DC

## 2019-12-15 MED ORDER — OZEMPIC (0.25 OR 0.5 MG/DOSE) 2 MG/1.5ML ~~LOC~~ SOPN: PEN_INJECTOR | SUBCUTANEOUS | 0 refills | Status: DC

## 2019-12-16 ENCOUNTER — Other Ambulatory Visit: Payer: Self-pay | Admitting: Physician Assistant

## 2019-12-16 MED ORDER — NOVOLOG FLEXPEN 100 UNIT/ML ~~LOC~~ SOPN: 5.0000 [IU] | PEN_INJECTOR | Freq: Three times a day (TID) | SUBCUTANEOUS | 0 refills | Status: DC

## 2020-01-31 ENCOUNTER — Telehealth: Payer: Self-pay | Admitting: Physician Assistant

## 2020-01-31 DIAGNOSIS — E1165 Type 2 diabetes mellitus with hyperglycemia: Secondary | ICD-10-CM

## 2020-01-31 NOTE — Telephone Encounter (Signed)
Pt concerned that BS seems to be uncontrollable unless Ozempic used & she is requesting a refill & a Referral to an Endocrinologist for help . Per pt it stays in the 300s  After taking insulin it comes down between 130-140s.  ---- Pt has scheduled appt w/ provider @ the 1st available 02/02/20)  Semaglutide,0.25 or 0.5MG /DOS, (OZEMPIC, 0.25 OR 0.5 MG/DOSE,) 2 MG/1.5ML SOPN [[id number]]   Order Details Dose, Route, Frequency: As Directed  Dispense Quantity: 2 mL Refills: 0       Sig: INJECT 0.25MG  INTO THE SKIN ONCE A WEEK FOR 14 DAYS THEN 0.5MG  ONCE A WEEK FOR 21 DAYS**PATIENT NEEDS APT FOR FURTHER REFILLS**   ---pt uses :  [place] ([place]), [place]  [address] ([place]) [place] [address]  Phone:  [phone] Fax:  [phone]   --forwarding request to med asst & referral coord.  --glh

## 2020-02-01 ENCOUNTER — Other Ambulatory Visit: Payer: BC Managed Care – PPO

## 2020-02-01 ENCOUNTER — Other Ambulatory Visit: Payer: Self-pay

## 2020-02-01 DIAGNOSIS — E559 Vitamin D deficiency, unspecified: Secondary | ICD-10-CM

## 2020-02-01 DIAGNOSIS — E785 Hyperlipidemia, unspecified: Secondary | ICD-10-CM

## 2020-02-01 DIAGNOSIS — E663 Overweight: Secondary | ICD-10-CM

## 2020-02-01 DIAGNOSIS — E1169 Type 2 diabetes mellitus with other specified complication: Secondary | ICD-10-CM

## 2020-02-01 DIAGNOSIS — E1165 Type 2 diabetes mellitus with hyperglycemia: Secondary | ICD-10-CM

## 2020-02-01 MED ORDER — OZEMPIC (0.25 OR 0.5 MG/DOSE) 2 MG/1.5ML ~~LOC~~ SOPN: PEN_INJECTOR | SUBCUTANEOUS | 0 refills | Status: DC

## 2020-02-01 NOTE — Telephone Encounter (Signed)
Refill sent to requested pharmacy. Referral to ENDO placed. AS, CMA

## 2020-02-01 NOTE — Addendum Note (Signed)
Addended by: Mickel Crow on: 02/01/2020 09:09 AM   Modules accepted: Orders

## 2020-02-02 ENCOUNTER — Ambulatory Visit (INDEPENDENT_AMBULATORY_CARE_PROVIDER_SITE_OTHER): Payer: BC Managed Care – PPO | Admitting: Physician Assistant

## 2020-02-02 ENCOUNTER — Encounter: Payer: Self-pay | Admitting: Physician Assistant

## 2020-02-02 ENCOUNTER — Other Ambulatory Visit: Payer: Self-pay

## 2020-02-02 VITALS — Temp 97.8°F | Ht 66.0 in | Wt 187.0 lb

## 2020-02-02 DIAGNOSIS — F331 Major depressive disorder, recurrent, moderate: Secondary | ICD-10-CM | POA: Diagnosis not present

## 2020-02-02 DIAGNOSIS — E785 Hyperlipidemia, unspecified: Secondary | ICD-10-CM

## 2020-02-02 DIAGNOSIS — E559 Vitamin D deficiency, unspecified: Secondary | ICD-10-CM

## 2020-02-02 DIAGNOSIS — E1169 Type 2 diabetes mellitus with other specified complication: Secondary | ICD-10-CM

## 2020-02-02 DIAGNOSIS — E1165 Type 2 diabetes mellitus with hyperglycemia: Secondary | ICD-10-CM | POA: Diagnosis not present

## 2020-02-02 LAB — LIPID PANEL
Chol/HDL Ratio: 2.8 ratio (ref 0.0–4.4)
Cholesterol, Total: 131 mg/dL (ref 100–199)
HDL: 46 mg/dL (ref >39)
LDL Chol Calc (NIH): 65 mg/dL (ref 0–99)
Triglycerides: 107 mg/dL (ref 0–149)
VLDL Cholesterol Cal: 20 mg/dL (ref 5–40)

## 2020-02-02 LAB — VITAMIN D 25 HYDROXY (VIT D DEFICIENCY, FRACTURES): Vit D, 25-Hydroxy: 41.6 ng/mL (ref 30.0–100.0)

## 2020-02-02 LAB — HEMOGLOBIN A1C
Est. average glucose Bld gHb Est-mCnc: 272 mg/dL
Hgb A1c MFr Bld: 11.1 % — ABNORMAL HIGH (ref 4.8–5.6)

## 2020-02-02 LAB — ALT: ALT: 23 IU/L (ref 0–32)

## 2020-02-02 MED ORDER — OZEMPIC (1 MG/DOSE) 2 MG/1.5ML ~~LOC~~ SOPN: 1.0000 mg | PEN_INJECTOR | SUBCUTANEOUS | 3 refills | Status: DC

## 2020-02-02 NOTE — Progress Notes (Signed)
Telehealth office visit note for Lindsey Reid, PA-C- at [facility] at [facility]   I connected with current patient today by telephone and verified that I am speaking with the correct person   . Location of the patient: Home . Location of the provider: Office - This visit type was conducted due to national recommendations for restrictions regarding the COVID-19 Pandemic (e.g. social distancing) in an effort to limit this patient's exposure and mitigate transmission in our community.    - No physical exam could be performed with this format, beyond that communicated to [place] by the patient/ family members as noted.   - Additionally my office staff/ schedulers were to discuss with the patient that there may be a monetary charge related to this service, depending on their medical insurance.  My understanding is that patient understood and consented to proceed.     _________________________________________________________________________________   History of Present Illness: Pt calls in to to follow-up on diabetes mellitus, hyperlipidemia and mood management.  Diabetes: Pt denies increased urination.  She does report increased thirst.  Pt states she is taking about 20 units of NovoLog midday depending on her glucose reading, and increased Levemir to 30 units at bedtime (different than what is on med list). Ozempic was restarted back in January and has been injecting 0.5 mg once weekly.  No hypoglycemic events. Checking glucose at home.  FBS range in the 200s, sometimes 300s. Reports she could probably do better with her diet.  Hyperlipidemia: Reports is tolerating Crestor without issues.  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  At last office visit it was decreased to 10 mg.  Continues to take Wellbutrin 300 mg.      GAD 7 : Generalized Anxiety Score 02/04/2019 05/05/2018  Nervous, Anxious, on Edge 1 3  Control/stop worrying 1 3  Worry too much -  different things 0 3  Trouble relaxing 1 2  Restless 1 3  Easily annoyed or irritable 0 2  Afraid - awful might happen 0 2  Total GAD 7 Score 4 18  Anxiety Difficulty Not difficult at all Very difficult    Depression screen [facility] 2/9 02/02/2020 07/13/2019 06/28/2019 05/04/2019 02/04/2019  Decreased Interest 0 0 0 0 0  Down, Depressed, Hopeless 0 0 0 0 0  PHQ - 2 Score 0 0 0 0 0  Altered sleeping '3 1 2 1 1  ' Tired, decreased energy '2 1 2 2 2  ' Change in appetite 0 0 0 2 1  Feeling bad or failure about yourself  0 0 0 1 0  Trouble concentrating 1 0 0 0 2  Moving slowly or fidgety/restless 0 0 0 0 0  Suicidal thoughts 0 0 0 0 0  PHQ-9 Score '6 2 4 6 6  ' Difficult doing work/chores Not difficult at all Not difficult at all Not difficult at all Not difficult at all Somewhat difficult  Some recent data might be hidden      Impression and Recommendations:     1. Poorly controlled type 2 diabetes mellitus ([place])- on insulin now for mgt   2. Hyperlipidemia associated with type 2 diabetes mellitus ([place])   3. Moderate episode of recurrent major depressive disorder ([place])   4. Vitamin D deficiency     Poorly controlled type 2 diabetes mellitus: -Most recent A1c 11.1, increased from prior -Patient was referred to endocrinology and provided office information to call and schedule an appointment. -Continue with  current medication regimen, will increase Ozempic to 1 mg weekly. Will defer additional medication adjustments to endocrinology. -Recommend to reduce carbohydrates and glucose.  Hyperlipidemia associated with type 2 diabetes mellitus: -Most recent lipid panel WNL and improved from prior. -Continue current medication regimen (most recent ALT WNL). -Follow heart healthy diet and stay as active as possible. -Will continue to monitor.  Moderate episode of recurrent MDD: -Stable, denies SI/HI. -Continue Wellbutrin and Prozac. -Will continue to monitor.  Vitamin D deficiency: -Most recent  vitamin D 41.6, significantly improved from 8.1 -Continue vitamin D supplement   - As part of my medical decision making, I reviewed the following data within the [place] History obtained from pt /family, CMA notes reviewed and incorporated if applicable, Labs reviewed, Radiograph/ tests reviewed if applicable and OV notes from prior OV's with me, as well as any other specialists she/he has seen since seeing me last, were all reviewed and used in my medical decision making process today.    - Additionally, when appropriate, discussion had with patient regarding our treatment plan, and their biases/concerns about that plan were used in my medical decision making today.    - The patient agreed with the plan and demonstrated an understanding of the instructions.   No barriers to understanding were identified.     - The patient was advised to call back or seek an in-person evaluation if the symptoms worsen or if the condition fails to improve as anticipated.   Return in about 4 months (around 06/03/2020) for DM, Mood, HLD.    No orders of the defined types were placed in this encounter.   Meds ordered this encounter  Medications  . Semaglutide, 1 MG/DOSE, (OZEMPIC, 1 MG/DOSE,) 2 MG/1.5ML SOPN    Sig: Inject 0.75 mLs (1 mg total) into the skin once a week.    Dispense:  1.5 mL    Refill:  3    Order Specific Question:   Supervising Provider    Answer:   Beatrice Lecher D [[id number]]    Medications Discontinued During This Encounter  Medication Reason  . Semaglutide,0.25 or 0.5MG/DOS, (OZEMPIC, 0.25 OR 0.5 MG/DOSE,) 2 MG/1.5ML SOPN Dose change       Time spent on visit including pre-visit chart review and post-visit care was 15 minutes.      The [place] was signed into law in 2016 which includes the topic of electronic health records.  This provides immediate access to information in MyChart.  This includes consultation notes, operative notes,  office notes, lab results and pathology reports.  If you have any questions about what you read please let us know at your next visit or call us at the office.  We are right here with you.  Note:  This note was prepared with assistance of Dragon voice recognition software. Occasional wrong-word or sound-a-like substitutions may have occurred due to the inherent limitations of voice recognition software.  __________________________________________________________________________________     Patient Care Team    Relationship Specialty Notifications Start End  Lindsey Mason, [place] PCP - General   10/31/19   Marin Comment, My [place], [place] Referring Physician Optometry  08/04/17   Mansouraty, Telford Nab., MD Consulting Physician Gastroenterology  03/17/18   [facility]    02/16/19   Daryll Brod, MD Consulting Physician Orthopedic Surgery  05/04/19   Megan Salon, MD Consulting Physician Gynecology  07/23/19      -Vitals obtained; medications/ allergies reconciled;  personal medical, social,  Sx etc.histories were updated by CMA, reviewed by me and are reflected in chart   Patient Active Problem List   Diagnosis Date Noted  . Nausea and vomiting 12/24/2018  . Nausea & vomiting 12/24/2018  . Generalized abdominal pain 12/24/2018  . Statin declined 05/05/2018  . Bloating 03/10/2018  . Alternating constipation and diarrhea 03/10/2018  . Pyrosis 03/10/2018  . Abnormal liver ultrasound 03/10/2018  . Calculus of gallbladder without cholecystitis without obstruction 03/10/2018  . h/o Enlarged ovaries 11/20/2017  . Environmental and seasonal allergies 11/04/2017  . Psychophysiological insomnia 11/04/2017  . Vitamin D deficiency 08/26/2017  . Depression 09/03/2016  . Overweight (BMI 25.0-29.9) 09/03/2016  . Witnessed episode of apnea 09/03/2016  . Urinary frequency 04/03/2016  . Acute maxillary sinusitis 11/04/2014  . 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 PCOS (polycystic ovarian syndrome)  05/27/2012  . 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 Major depressive disorder, recurrent episode ([place]) 08/20/2010  . Hyperlipidemia associated with type 2 diabetes mellitus ([place]) 04/11/2009  . 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 Poorly controlled type 2 diabetes mellitus ([place])- on insulin now for [facility] 03/08/2009  . GERD 03/08/2009     Current Meds  Medication Sig  . blood glucose meter kit and supplies Dispense based on patient and insurance preference. Use in the morning when fasting and 2 hours after largest meal. (FOR ICD-10 E10.9, E11.9).  [name] buPROPion (WELLBUTRIN XL) 300 MG 24 hr tablet Take 1 tablet (300 mg total) by mouth daily.  . Continuous Blood Gluc Receiver (DEXCOM G5 RECEIVER KIT) DEVI 1 kit by Does not apply route 2 (two) times daily.  . Continuous Blood Gluc Sensor (DEXCOM G6 SENSOR) MISC 1 Device by Does not apply route 2 (two) times daily.  [name] FLUoxetine (PROZAC) 20 MG tablet Take 0.5 tablets (10 mg total) by mouth daily. (Patient taking differently: Take 20 mg by mouth daily. Taking whole tablet)  . fluticasone (FLONASE) 50 MCG/ACT nasal spray 1 spray each nostril after sinus rinse twice daily (Patient taking differently: Place 1 spray into both nostrils daily. )  . ibuprofen (ADVIL) 200 MG tablet Take 400 mg by mouth every 6 (six) hours as needed for moderate pain.  . Insulin Detemir (LEVEMIR) 100 UNIT/ML Pen Inject 20 Units into the skin 2 (two) times daily.  . metFORMIN (GLUCOPHAGE-XR) 500 MG 24 hr tablet Take 4 tablets (2,000 mg total) by mouth daily with breakfast. **PATIENT NEEDS APT FOR FURTHER REFILLS**  . NOVOLOG FLEXPEN 100 UNIT/ML FlexPen Inject 5 Units into the skin 3 (three) times daily with meals.  Glory Rosebush VERIO test strip USE IN THE MORNING WHEN FASTING AND 2 HOURS AFTER LARGEST MEAL  . rosuvastatin (CRESTOR) 5 MG tablet Take 1 tablet (5 mg total) by mouth at bedtime.  . Vitamin D, Ergocalciferol, (DRISDOL) 1.25 MG (50000 UNIT) CAPS capsule Please take 1  tablet Wednesday and 1 tablet Sunday every week  . [DISCONTINUED] Semaglutide,0.25 or 0.5MG/DOS, (OZEMPIC, 0.25 OR 0.5 MG/DOSE,) 2 MG/1.5ML SOPN INJECT 0.25MG INTO THE SKIN  ONCE A WEEK FOR 14 DAYS THEN 0.5MG ONCE A WEEK FOR 21 DAYS**PATIENT NEEDS APT FOR FURTHER REFILLS**     Allergies:  Allergies  Allergen Reactions  . Lantus [Insulin Glargine] Other (See Comments)    headaches     ROS:  See above HPI for pertinent positives and negatives   Objective:   Temperature 97.8 F (36.6 C), temperature source Temporal, height '5\' 6"'  (1.676 m), weight 187 lb (84.8 kg).  (if some vitals  are omitted, this means that patient was UNABLE to obtain them even though they were asked to get them prior to OV today.  They were asked to call us at their earliest convenience with these once obtained. ) General: A & O * 3; sounds in no acute distress; in usual state of health.  Respiratory: speaking in full sentences, no conversational dyspnea Psych: insight appears good, mood- appears full

## 2020-03-23 ENCOUNTER — Encounter: Payer: Self-pay | Admitting: Internal Medicine

## 2020-03-24 ENCOUNTER — Encounter: Payer: Self-pay | Admitting: Internal Medicine

## 2020-03-24 ENCOUNTER — Other Ambulatory Visit: Payer: Self-pay

## 2020-03-24 ENCOUNTER — Ambulatory Visit: Payer: BC Managed Care – PPO | Admitting: Internal Medicine

## 2020-03-24 VITALS — BP 120/78 | HR 95 | Ht 66.0 in | Wt 176.4 lb

## 2020-03-24 DIAGNOSIS — E1142 Type 2 diabetes mellitus with diabetic polyneuropathy: Secondary | ICD-10-CM

## 2020-03-24 DIAGNOSIS — E1165 Type 2 diabetes mellitus with hyperglycemia: Secondary | ICD-10-CM | POA: Diagnosis not present

## 2020-03-24 MED ORDER — INSULIN DETEMIR 100 UNIT/ML FLEXPEN: PEN_INJECTOR | SUBCUTANEOUS | 3 refills | Status: DC

## 2020-03-24 MED ORDER — FREESTYLE LIBRE 14 DAY READER DEVI: 1.0000 | Freq: Once | 0 refills | Status: AC

## 2020-03-24 MED ORDER — FREESTYLE LIBRE 14 DAY SENSOR MISC: 1.0000 | 3 refills | Status: DC

## 2020-03-24 MED ORDER — METFORMIN HCL ER 500 MG PO TB24: 1000.0000 mg | ORAL_TABLET | Freq: Two times a day (BID) | ORAL | 3 refills | Status: DC

## 2020-03-24 MED ORDER — OZEMPIC (1 MG/DOSE) 4 MG/3ML ~~LOC~~ SOPN: 1.0000 mg | PEN_INJECTOR | SUBCUTANEOUS | 3 refills | Status: DC

## 2020-03-24 NOTE — Progress Notes (Signed)
Patient ID: Lindsey Mason, female   DOB: [birth year], 49 y.o.   MRN: [id number]   This visit occurred during the SARS-CoV-2 public health emergency.  Safety protocols were in place, including screening questions prior to the visit, additional usage of staff PPE, and extensive cleaning of exam room while observing appropriate contact time as indicated for disinfecting solutions.  For HPI: Lindsey Mason is a 49 y.o.-year-old female, referred by her PCP, Lorrene Reid, PA-C, for management of DM2, prev. GDM with triplets in 1997, then dx'ed with DM right after the pregnancy, dx in 2005, insulin-dependent, uncontrolled, with complications (Dupuytren contractions, adhesive capsulitis).  Reviewed HbA1c: Lab Results  Component Value Date   HGBA1C 11.1 (H) 02/01/2020   HGBA1C 8.8 (H) 06/28/2019   HGBA1C 6.2 (A) 05/04/2019   HGBA1C 8.4 (A) 02/04/2019   HGBA1C 11.3 (H) 12/25/2018   HGBA1C 5.9 01/27/2018   HGBA1C 9.6 11/04/2017   HGBA1C >14 08/04/2017   HGBA1C 7.2 (H) 03/14/2016   HGBA1C 8.1 (H) 12/13/2015   Pt is on a regimen of: - Metformin ER 2000 mg with dinner - Ozempic 1 mg weekly  -increase 01/2020 - still has nausea  - Levemir 15 units 2x a day (25 units 2x a day if off Ozempic) - Novolog 10 units 2h after a meal (usually dinner) depending on the CBGs  Pt checks her sugars 0-1 a day and they are (before starting the [place]): - am: 150-300 - 2h after b'fast: n/c - before lunch: n/c - 2h after lunch: 225-250 - before dinner: n/c - 2h after dinner: 225-250, 400, 500 - bedtime: n/c - nighttime: n/c Lowest sugar was 60; she has hypoglycemia awareness at 80.  Highest sugar was 500 in last 4 mo.  Glucometer: One Touch verio; Had a Dexcom CGM >> sugars were great then.  Pt's meals are - now on Optivia diet 3 weeks - lost 12 lbs: - Breakfast: Protein bar - Protein shake - Lunch: soup, mashed potato, penne pasta - Dinner: lean meat + green veggies - Snacks: no snacks after  dinner  - no CKD, last BUN/creatinine:  Lab Results  Component Value Date   BUN 13 06/28/2019   BUN 10 12/31/2018   CREATININE 0.51 (L) 06/28/2019   CREATININE 0.46 (L) 12/31/2018  Not on ACE inhibitor/ARB.  -+ HL; last set of lipids: Lab Results  Component Value Date   CHOL 131 02/01/2020   HDL 46 02/01/2020   LDLCALC 65 02/01/2020   LDLDIRECT 104.2 03/09/2014   TRIG 107 02/01/2020   CHOLHDL 2.8 02/01/2020  On Crestor 5 mg daily.  - last eye exam was in 10/2019. No DR.   - + numbness and tingling in R foot.  She has Dupuytren contractions and adhesive capsulitis.  Pt has FH of DM in mother and father.  She also has a history of PCOS.  No FH of MTC or personal pancreatitis hx.  ROS: Constitutional: no weight gain, + weight loss, no fatigue, no subjective hyperthermia, no subjective hypothermia, no nocturia Eyes: no blurry vision, no xerophthalmia ENT: no sore throat, no nodules palpated in neck, no dysphagia, no odynophagia, no hoarseness, no tinnitus, no hypoacusis Cardiovascular: no CP, no SOB, no palpitations, no leg swelling Respiratory: no cough, no SOB, no wheezing Gastrointestinal: + N, no V, no D, no C, no acid reflux Musculoskeletal: no muscle, no joint aches Skin: no rash, no hair loss Neurological: no tremors, + numbness and tingling/no dizziness/no HAs Psychiatric: no depression, no anxiety  Past Medical History:  Diagnosis Date  . Clomid pregnancy 1997, 2005  . Depression   . Diabetes mellitus without mention of complication   . Esophageal reflux   . Headache(784.0)   . Infertility, female    PCOS - Clomid pregnancies   . Ovarian cyst 2019  . PCOS (polycystic ovarian syndrome) 05/27/2012  . PONV (postoperative nausea and vomiting)    nausea  . Pure hypercholesterolemia   . Rosacea   . Status post appendectomy 11/19/2017    Past Surgical History:  Procedure Laterality Date  . APPENDECTOMY    . CESAREAN SECTION     x 3, last one with  triplets  . CHOLECYSTECTOMY N/A 12/25/2018   Procedure: LAPAROSCOPIC CHOLECYSTECTOMY WITH INTRAOPERATIVE CHOLANGIOGRAM;  Surgeon: Jovita Kussmaul, MD;  Location: [facility];  Service: General;  Laterality: N/A;  . LAPAROSCOPIC BILATERAL SALPINGECTOMY Right 02/16/2018   Procedure: LAPAROSCOPIC RIGHT SALPINGECTOMY;  Surgeon: Megan Salon, MD;  Location: [facility];  Service: Gynecology;  Laterality: Right;  . TUBAL LIGATION      Social History   Socioeconomic History  . Marital status: Married    Spouse name: Not on file  . Number of children: 5  . Years of education: Not on file  . Highest education level: Not on file  Occupational History  . Occupation: Risk manager: [employer]  Tobacco Use  . Smoking status: Former Smoker    Packs/day: 1.00    Years: 15.00    Pack years: 15.00    Types: Cigarettes    Quit date: 03/04/2004    Years since quitting: 16.0  . Smokeless tobacco: Never Used  Vaping Use  . Vaping Use: Never used  Substance and Sexual Activity  . Alcohol use: Yes    Comment: occ  . Drug use: No  . Sexual activity: Yes    Birth control/protection: Surgical    Comment: btl  Other Topics Concern  . Not on file  Social History Narrative  . Not on file   Social Determinants of Health   Financial Resource Strain:   . Difficulty of Paying Living Expenses: Not on file  Food Insecurity:   . Worried About Charity fundraiser in the Last Year: Not on file  . Ran Out of Food in the Last Year: Not on file  Transportation Needs:   . Lack of Transportation (Medical): Not on file  . Lack of Transportation (Non-Medical): Not on file  Physical Activity:   . Days of Exercise per Week: Not on file  . Minutes of Exercise per Session: Not on file  Stress:   . Feeling of Stress : Not on file  Social Connections:   . Frequency of Communication with Friends and Family: Not on file  . Frequency of Social Gatherings with Friends and Family: Not on  file  . Attends Religious Services: Not on file  . Active Member of Clubs or Organizations: Not on file  . Attends Archivist Meetings: Not on file  . Marital Status: Not on file  Intimate Partner Violence:   . Fear of Current or Ex-Partner: Not on file  . Emotionally Abused: Not on file  . Physically Abused: Not on file  . Sexually Abused: Not on file    Current Outpatient Medications on File Prior to Visit  Medication Sig Dispense Refill  . blood glucose meter kit and supplies Dispense based on patient and insurance preference. Use in the morning when  fasting and 2 hours after largest meal. (FOR ICD-10 E10.9, E11.9). 1 each 0  . buPROPion (WELLBUTRIN XL) 300 MG 24 hr tablet Take 1 tablet (300 mg total) by mouth daily. 90 tablet 0  . Continuous Blood Gluc Receiver (DEXCOM G5 RECEIVER KIT) DEVI 1 kit by Does not apply route 2 (two) times daily. 1 Device 0  . Continuous Blood Gluc Sensor (DEXCOM G6 SENSOR) MISC 1 Device by Does not apply route 2 (two) times daily. 3 each 2  . FLUoxetine (PROZAC) 20 MG tablet Take 0.5 tablets (10 mg total) by mouth daily. (Patient taking differently: Take 20 mg by mouth daily. Taking whole tablet) 90 tablet 1  . fluticasone (FLONASE) 50 MCG/ACT nasal spray 1 spray each nostril after sinus rinse twice daily (Patient taking differently: Place 1 spray into both nostrils daily. ) 16 g 2  . ibuprofen (ADVIL) 200 MG tablet Take 400 mg by mouth every 6 (six) hours as needed for moderate pain.    . Insulin Detemir (LEVEMIR) 100 UNIT/ML Pen Inject 20 Units into the skin 2 (two) times daily. 15 mL 2  . metFORMIN (GLUCOPHAGE-XR) 500 MG 24 hr tablet Take 4 tablets (2,000 mg total) by mouth daily with breakfast. **PATIENT NEEDS APT FOR FURTHER REFILLS** 240 tablet 0  . NOVOLOG FLEXPEN 100 UNIT/ML FlexPen Inject 5 Units into the skin 3 (three) times daily with meals. 15 mL 0  . ONETOUCH VERIO test strip USE IN THE MORNING WHEN FASTING AND 2 HOURS AFTER LARGEST MEAL  200 each 0  . rosuvastatin (CRESTOR) 5 MG tablet Take 1 tablet (5 mg total) by mouth at bedtime. 90 tablet 1  . Semaglutide, 1 MG/DOSE, (OZEMPIC, 1 MG/DOSE,) 2 MG/1.5ML SOPN Inject 0.75 mLs (1 mg total) into the skin once a week. 1.5 mL 3  . Vitamin D, Ergocalciferol, (DRISDOL) 1.25 MG (50000 UNIT) CAPS capsule Please take 1 tablet Wednesday and 1 tablet Sunday every week 24 capsule 3   No current facility-administered medications on file prior to visit.    Allergies  Allergen Reactions  . Lantus [Insulin Glargine] Other (See Comments)    headaches    Family History  Problem Relation Age of Onset  . Cancer Mother        breast  . Heart disease Mother   . Stroke Mother   . Diabetes Mother   . Diabetes Father   . Breast cancer Maternal Aunt   . Colon cancer Neg Hx   . Inflammatory bowel disease Neg Hx   . Liver disease Neg Hx   . Pancreatic cancer Neg Hx   . Stomach cancer Neg Hx   . Esophageal cancer Neg Hx     PE: BP 120/78 (BP Location: Left Arm, Patient Position: Sitting, Cuff Size: Normal)   Pulse 95   Ht '5\' 6"'  (1.676 m)   Wt 176 lb 6.4 oz (80 kg)   SpO2 98%   BMI 28.47 kg/m  Wt Readings from Last 3 Encounters:  03/24/20 176 lb 6.4 oz (80 kg)  02/02/20 187 lb (84.8 kg)  07/13/19 190 lb (86.2 kg)   Constitutional: overweight, in NAD Eyes: PERRLA, EOMI, no exophthalmos ENT: moist mucous membranes, no thyromegaly, no cervical lymphadenopathy Cardiovascular: RRR, No MRG Respiratory: CTA B Gastrointestinal: abdomen soft, NT, ND, BS+ Musculoskeletal: no deformities, strength intact in all 4 Skin: moist, warm, no rashes Neurological: no tremor with outstretched hands, DTR normal in all 4  ASSESSMENT: 1. DM2, insulin-dependent, uncontrolled, with  long-term complications -Dupuytren  contractions  -Adhesive capsulitis  PLAN:  1. Patient with long-standing, uncontrolled diabetes, on oral antidiabetic regimen with Metformin, also, basal-bolus insulin regimen and  weekly GLP-1 receptor agonist, which became insufficient.  Her most recent HbA1c was 11.1% on 02/01/2020. -At today's visit, she tells me that she has not been checking sugars recently, especially after she starting Leeper in the last 3 weeks.  Therefore, it is difficult to make treatment decisions based on previous blood sugars, since I do expect that the sugars improved on the current diet. -She does describe nausea with Ozempic but she is aware of the excellent effect of Ozempic on her blood sugars and would like to continue with it.  She does feel that the nausea is improving.  However, to improve this further, I advised her to try to split Metformin into 2 doses. -Also, to improve her sugars in the morning I advised her to redistribute Levemir into a lower dose in the morning and a higher dose at night (see below).  For now, we will hold her NovoLog.  If the sugars do not improve, and we need to add back NovoLog, I explained that this is usually taken 15 minutes before meals, and not 2 hours after. -She describes that she did very well when she was on a Dexcom CGM.  However, this is now expensive.  I will try to call in the freestyle libre CGM to her pharmacy. - I suggested to:  Patient Instructions  Please try to split: - Metformin ER 1000 mg 2x a day, with meals  Continue: - Ozempic 1 mg weekly  Change: - Levemir 10 units in am and 20 at night  Hold: - Novolog   Please let me know if the sugars are consistently <80 or >200.  Please return in 2 months with your sugar log.   - Strongly advised her to start checking sugars at different times of the day - check 4x a day, rotating checks - discussed about CBG targets for treatment: 80-130 mg/dL before meals and <180 mg/dL after meals; target HbA1c <7%. - given sugar log and advised how to fill it and to bring it at next appt in case she does not get the CGM, in which case we can download her receiver - given foot care handout and  explained the principles  - given instructions for hypoglycemia management "15-15 rule"  - advised for yearly eye exams  - Return to clinic in 2 mo with her sugar log   Philemon Kingdom, MD PhD [facility] Endocrinology

## 2020-03-24 NOTE — Patient Instructions (Addendum)
Please try to split: - Metformin ER 1000 mg 2x a day, with meals  Continue: - Ozempic 1 mg weekly  Change: - Levemir 10 units in am and 20 at night  Hold: - Novolog   Please let me know if the sugars are consistently <80 or >200.  Please return in 2 months with your sugar log.   PATIENT INSTRUCTIONS FOR TYPE 2 DIABETES:  DIET AND EXERCISE Diet and exercise is an important part of diabetic treatment.  We recommended aerobic exercise in the form of brisk walking (working between 40-60% of maximal aerobic capacity, similar to brisk walking) for 150 minutes per week (such as 30 minutes five days per week) along with 3 times per week performing 'resistance' training (using various gauge rubber tubes with handles) 5-10 exercises involving the major muscle groups (upper body, lower body and core) performing 10-15 repetitions (or near fatigue) each exercise. Start at half the above goal but build slowly to reach the above goals. If limited by weight, joint pain, or disability, we recommend daily walking in a swimming pool with water up to waist to reduce pressure from joints while allow for adequate exercise.    BLOOD GLUCOSES Monitoring your blood glucoses is important for continued management of your diabetes. Please check your blood glucoses 2-4 times a day: fasting, before meals and at bedtime (you can rotate these measurements - e.g. one day check before the 3 meals, the next day check before 2 of the meals and before bedtime, etc.).   HYPOGLYCEMIA (low blood sugar) Hypoglycemia is usually a reaction to not eating, exercising, or taking too much insulin/ other diabetes drugs.  Symptoms include tremors, sweating, hunger, confusion, headache, etc. Treat IMMEDIATELY with 15 grams of Carbs:  4 glucose tablets   cup regular juice/soda  2 tablespoons raisins  4 teaspoons sugar  1 tablespoon honey Recheck blood glucose in 15 mins and repeat above if still symptomatic/blood glucose  <100.  RECOMMENDATIONS TO REDUCE YOUR RISK OF DIABETIC COMPLICATIONS: * Take your prescribed MEDICATION(S) * Follow a DIABETIC diet: Complex carbs, fiber rich foods, (monounsaturated and polyunsaturated) fats * AVOID saturated/trans fats, high fat foods, >2,300 mg salt per day. * EXERCISE at least 5 times a week for 30 minutes or preferably daily.  * DO NOT SMOKE OR DRINK more than 1 drink a day. * Check your FEET every day. Do not wear tightfitting shoes. Contact us if you develop an ulcer * See your EYE doctor once a year or more if needed * Get a FLU shot once a year * Get a PNEUMONIA vaccine once before and once after age 67 years  GOALS:  * Your Hemoglobin A1c of <7%  * fasting sugars need to be <130 * after meals sugars need to be <180 (2h after you start eating) * Your Systolic BP should be 845 or lower  * Your Diastolic BP should be 80 or lower  * Your HDL (Good Cholesterol) should be 40 or higher  * Your LDL (Bad Cholesterol) should be 100 or lower. * Your Triglycerides should be 150 or lower  * Your Urine microalbumin (kidney function) should be <30 * Your Body Mass Index should be 25 or lower    Please consider the following ways to cut down carbs and fat and increase fiber and micronutrients in your diet: - substitute whole grain for white bread or pasta - substitute brown rice for white rice - substitute 90-calorie flat bread pieces for slices of bread when  possible - substitute sweet potatoes or yams for white potatoes - substitute humus for margarine - substitute tofu for cheese when possible - substitute almond or rice milk for regular milk (would not drink soy milk daily due to concern for soy estrogen influence on breast cancer risk) - substitute dark chocolate for other sweets when possible - substitute water - can add lemon or orange slices for taste - for diet sodas (artificial sweeteners will trick your body that you can eat sweets without getting calories and  will lead you to overeating and weight gain in the long run) - do not skip breakfast or other meals (this will slow down the metabolism and will result in more weight gain over time)  - can try smoothies made from fruit and almond/rice milk in am instead of regular breakfast - can also try old-fashioned (not instant) oatmeal made with almond/rice milk in am - order the dressing on the side when eating salad at a restaurant (pour less than half of the dressing on the salad) - eat as little meat as possible - can try juicing, but should not forget that juicing will get rid of the fiber, so would alternate with eating raw veg./fruits or drinking smoothies - use as little oil as possible, even when using olive oil - can dress a salad with a mix of balsamic vinegar and lemon juice, for e.g. - use agave nectar, stevia sugar, or regular sugar rather than artificial sweateners - steam or broil/roast veggies  - snack on veggies/fruit/nuts (unsalted, preferably) when possible, rather than processed foods - reduce or eliminate aspartame in diet (it is in diet sodas, chewing gum, etc) Read the labels!  Try to read Dr. Janene Harvey book: "Program for Reversing Diabetes" for other ideas for healthy eating.

## 2020-05-23 ENCOUNTER — Ambulatory Visit: Payer: Self-pay | Admitting: Internal Medicine

## 2020-06-21 ENCOUNTER — Other Ambulatory Visit: Payer: Self-pay | Admitting: Physician Assistant

## 2020-06-21 ENCOUNTER — Other Ambulatory Visit: Payer: Self-pay | Admitting: Family Medicine

## 2020-06-21 DIAGNOSIS — F331 Major depressive disorder, recurrent, moderate: Secondary | ICD-10-CM

## 2020-06-21 NOTE — Telephone Encounter (Signed)
Per your last OV note:  Will defer additional medication adjustments to endocrinology.  Should Dr. Renne Crigler prescribe DM meds since she is managing pt's DM?  Please advise.  Charyl Bigger, CMA

## 2020-06-22 NOTE — Telephone Encounter (Signed)
Yes, patient should contact Endocrinology. Reviewed Dr. Chrissie Noa last note and advised to hold the Novolog and patient instructed to follow up in 2 months.  Thank you, Herb Grays

## 2020-07-03 ENCOUNTER — Ambulatory Visit: Payer: Self-pay | Admitting: Internal Medicine

## 2020-07-03 ENCOUNTER — Other Ambulatory Visit: Payer: Self-pay

## 2020-07-03 ENCOUNTER — Encounter: Payer: Self-pay | Admitting: Internal Medicine

## 2020-07-03 VITALS — BP 124/84 | HR 92 | Ht 66.0 in | Wt 175.2 lb

## 2020-07-03 DIAGNOSIS — E785 Hyperlipidemia, unspecified: Secondary | ICD-10-CM

## 2020-07-03 DIAGNOSIS — E663 Overweight: Secondary | ICD-10-CM

## 2020-07-03 DIAGNOSIS — E1142 Type 2 diabetes mellitus with diabetic polyneuropathy: Secondary | ICD-10-CM

## 2020-07-03 DIAGNOSIS — E1169 Type 2 diabetes mellitus with other specified complication: Secondary | ICD-10-CM

## 2020-07-03 DIAGNOSIS — E1165 Type 2 diabetes mellitus with hyperglycemia: Secondary | ICD-10-CM

## 2020-07-03 LAB — POCT GLYCOSYLATED HEMOGLOBIN (HGB A1C): Hemoglobin A1C: 12.9 % — AB (ref 4.0–5.6)

## 2020-07-03 MED ORDER — FREESTYLE LIBRE 2 READER DEVI: 1.0000 | Freq: Every day | 0 refills | Status: DC

## 2020-07-03 MED ORDER — FREESTYLE LIBRE 2 SENSOR MISC: 1.0000 | 3 refills | Status: DC

## 2020-07-03 MED ORDER — NOVOLOG FLEXPEN 100 UNIT/ML ~~LOC~~ SOPN: 7.0000 [IU] | PEN_INJECTOR | Freq: Three times a day (TID) | SUBCUTANEOUS | 3 refills | Status: DC

## 2020-07-03 MED ORDER — INSULIN DETEMIR 100 UNIT/ML FLEXPEN: PEN_INJECTOR | SUBCUTANEOUS | 3 refills | Status: DC

## 2020-07-03 NOTE — Patient Instructions (Addendum)
Please continue: - Metformin ER 1000 mg 2x a day, with meals - Ozempic 1 mg weekly  Please restart: - Levemir 10 units in am and 20 at night - Novolog 7-10 units 15 min before meals  Please return in 1.5 months with your sugar log.

## 2020-07-03 NOTE — Progress Notes (Signed)
Patient ID: Lindsey Mason, female   DOB: 1970-11-07, 50 y.o.   MRN: [id number]   This visit occurred during the SARS-CoV-2 public health emergency.  Safety protocols were in place, including screening questions prior to the visit, additional usage of staff PPE, and extensive cleaning of exam room while observing appropriate contact time as indicated for disinfecting solutions.  For HPI: Lindsey Mason is a 50 y.o.-year-old female, initially referred by her PCP, Mariel Kansky, [place], PA-C, returning for follow-up for DM2, prev. GDM with triplets in 1997, then dx'ed with DM right after the pregnancy, dx in 2005, insulin-dependent, uncontrolled, with complications (Dupuytren contractions, adhesive capsulitis).  Last visit 3 months ago.  In last mo, she was out of Levemir, she was very busy >> relaxed her diet, missed medications.  Reviewed HbA1c levels: Lab Results  Component Value Date   HGBA1C 11.1 (H) 02/01/2020   HGBA1C 8.8 (H) 06/28/2019   HGBA1C 6.2 (A) 05/04/2019   HGBA1C 8.4 (A) 02/04/2019   HGBA1C 11.3 (H) 12/25/2018   HGBA1C 5.9 01/27/2018   HGBA1C 9.6 11/04/2017   HGBA1C >14 08/04/2017   HGBA1C 7.2 (H) 03/14/2016   HGBA1C 8.1 (H) 12/13/2015   Pt is on a regimen of: - Metformin ER 2000 mg with dinner >> 1000 mg 2x a day - Ozempic 1 mg weekly  -increase 01/2020  - Levemir 15 units 2x a day (25 units 2x a day if off Ozempic) >> 10 units in a.m. and 20 units at night >> ran out 1 mo ago!!  >> stopped 03/2020  Pt checks her sugars 0 to once a day: - am: 150-300 >> 287, 361, 500 - 2h after b'fast: n/c - before lunch: n/c - 2h after lunch: 225-250 >> n/c - before dinner: n/c - 2h after dinner: 225-250, 400, 500 >> 477 - bedtime: n/c - nighttime: n/c Lowest sugar was 60 >> 287; she has hypoglycemia awareness at 80.  Highest sugar was 500 in last 4 mo >> 500.  Glucometer: One Touch verio; Had a Dexcom CGM >> sugars were great then.  Pt's meals are - now on Optivia diet 3 weeks -  lost 12 lbs: - Breakfast: Protein bar - Protein shake - Lunch: soup, mashed potato, penne pasta - Dinner: lean meat + green veggies - Snacks: no snacks after dinner  -No CKD, last BUN/creatinine:  Lab Results  Component Value Date   BUN 13 06/28/2019   BUN 10 12/31/2018   CREATININE 0.51 (L) 06/28/2019   CREATININE 0.46 (L) 12/31/2018  Not on ACE inhibitor/ARB.  -+ HL; last set of lipids: Lab Results  Component Value Date   CHOL 131 02/01/2020   HDL 46 02/01/2020   LDLCALC 65 02/01/2020   LDLDIRECT 104.2 03/09/2014   TRIG 107 02/01/2020   CHOLHDL 2.8 02/01/2020  On Crestor 5 mg daily.  - last eye exam was in 10/2019: No DR  -She has numbness and tingling in the right foot  She has Dupuytren contractions and adhesive capsulitis.  Pt has FH of DM in mother and father.  She also has a history of PCOS.  No FH of MTC or personal pancreatitis hx.  ROS: Constitutional: no weight gain/no weight loss, no fatigue, no subjective hyperthermia, no subjective hypothermia, + increased thirst and urination Eyes: no blurry vision, no xerophthalmia ENT: no sore throat, no nodules palpated in neck, no dysphagia, no odynophagia, no hoarseness Cardiovascular: no CP/no SOB/no palpitations/no leg swelling Respiratory: no cough/no SOB/no wheezing Gastrointestinal: no N/no V/no  D/no C/no acid reflux Musculoskeletal: no muscle aches/no joint aches Skin: no rashes, no hair loss Neurological: no tremors/+ numbness/+ tingling/no dizziness  I reviewed pt's medications, allergies, PMH, social hx, family hx, and changes were documented in the history of present illness. Otherwise, unchanged from my initial visit note.   Past Medical History:  Diagnosis Date  . Clomid pregnancy 1997, 2005  . Depression   . Diabetes mellitus without mention of complication   . Esophageal reflux   . Headache(784.0)   . Infertility, female    PCOS - Clomid pregnancies   . Ovarian cyst 2019  . PCOS  (polycystic ovarian syndrome) 05/27/2012  . PONV (postoperative nausea and vomiting)    nausea  . Pure hypercholesterolemia   . Rosacea   . Status post appendectomy 11/19/2017    Past Surgical History:  Procedure Laterality Date  . APPENDECTOMY    . CESAREAN SECTION     x 3, last one with triplets  . CHOLECYSTECTOMY N/A 12/25/2018   Procedure: LAPAROSCOPIC CHOLECYSTECTOMY WITH INTRAOPERATIVE CHOLANGIOGRAM;  Surgeon: Jovita Kussmaul, MD;  Location: [facility];  Service: General;  Laterality: N/A;  . LAPAROSCOPIC BILATERAL SALPINGECTOMY Right 02/16/2018   Procedure: LAPAROSCOPIC RIGHT SALPINGECTOMY;  Surgeon: Megan Salon, MD;  [facility];  Service: Gynecology;  Laterality: Right;  . TUBAL LIGATION      Social History   Socioeconomic History  . Marital status: Married    Spouse name: Not on file  . Number of children: 5  . Years of education: Not on file  . Highest education level: Not on file  Occupational History  . Occupation: Risk manager: [employer]  Tobacco Use  . Smoking status: Former Smoker    Packs/day: 1.00    Years: 15.00    Pack years: 15.00    Types: Cigarettes    Quit date: 03/04/2004    Years since quitting: 16.3  . Smokeless tobacco: Never Used  Vaping Use  . Vaping Use: Never used  Substance and Sexual Activity  . Alcohol use: Yes    Comment: occ  . Drug use: No  . Sexual activity: Yes    Birth control/protection: Surgical    Comment: btl  Other Topics Concern  . Not on file  Social History Narrative  . Not on file   Social Determinants of Health   Financial Resource Strain: Not on file  Food Insecurity: Not on file  Transportation Needs: Not on file  Physical Activity: Not on file  Stress: Not on file  Social Connections: Not on file  Intimate Partner Violence: Not on file    Current Outpatient Medications on File Prior to Visit  Medication Sig Dispense Refill  . blood glucose meter kit and  supplies Dispense based on patient and insurance preference. Use in the morning when fasting and 2 hours after largest meal. (FOR ICD-10 E10.9, E11.9). 1 each 0  . buPROPion (WELLBUTRIN XL) 300 MG 24 hr tablet Take 1 tablet (300 mg total) by mouth daily. 90 tablet 0  . Continuous Blood Gluc Sensor (FREESTYLE LIBRE 14 DAY SENSOR) MISC 1 each by Does not apply route every 14 (fourteen) days. Change every 2 weeks 6 each 3  . FLUoxetine (PROZAC) 20 MG tablet Take 0.5 tablets (10 mg total) by mouth daily. (Patient taking differently: Take 20 mg by mouth daily. Taking whole tablet) 90 tablet 1  . fluticasone (FLONASE) 50 MCG/ACT nasal spray 1 spray each nostril after sinus  rinse twice daily (Patient taking differently: Place 1 spray into both nostrils daily. ) 16 g 2  . ibuprofen (ADVIL) 200 MG tablet Take 400 mg by mouth every 6 (six) hours as needed for moderate pain.    [name] insulin detemir (LEVEMIR) 100 UNIT/ML FlexPen Use 10 units in am and 20 units in pm under skin 30 mL 3  . metFORMIN (GLUCOPHAGE-XR) 500 MG 24 hr tablet Take 2 tablets (1,000 mg total) by mouth 2 (two) times daily with a meal. 360 tablet 3  . NOVOLOG FLEXPEN 100 UNIT/ML FlexPen Inject 5 Units into the skin 3 (three) times daily with meals. 15 mL 0  . ONETOUCH VERIO test strip USE IN THE MORNING WHEN FASTING AND 2 HOURS AFTER LARGEST MEAL 200 each 0  . rosuvastatin (CRESTOR) 5 MG tablet Take 1 tablet (5 mg total) by mouth at bedtime. 90 tablet 1  . Semaglutide, 1 MG/DOSE, (OZEMPIC, 1 MG/DOSE,) 4 MG/3ML SOPN Inject 1 mg into the skin once a week. 9 mL 3  . Vitamin D, Ergocalciferol, (DRISDOL) 1.25 MG (50000 UNIT) CAPS capsule Please take 1 tablet Wednesday and 1 tablet Sunday every week 24 capsule 3   No current facility-administered medications on file prior to visit.    Allergies  Allergen Reactions  . Lantus [Insulin Glargine] Other (See Comments)    headaches    Family History  Problem Relation Age of Onset  . Cancer Mother         breast  . Heart disease Mother   . Stroke Mother   . Diabetes Mother   . Diabetes Father   . Breast cancer Maternal Aunt   . Colon cancer Neg Hx   . Inflammatory bowel disease Neg Hx   . Liver disease Neg Hx   . Pancreatic cancer Neg Hx   . Stomach cancer Neg Hx   . Esophageal cancer Neg Hx     PE: BP 124/84   Pulse 92   Ht _0  (1.676 m)   Wt 175 lb 3.2 oz (79.5 kg)   SpO2 97%   BMI 28.28 kg/m  Wt Readings from Last 3 Encounters:  07/03/20 175 lb 3.2 oz (79.5 kg)  03/24/20 176 lb 6.4 oz (80 kg)  02/02/20 187 lb (84.8 kg)   Constitutional: overweight, in NAD Eyes: PERRLA, EOMI, no exophthalmos ENT: moist mucous membranes, no thyromegaly, no cervical lymphadenopathy Cardiovascular: RRR, No MRG Respiratory: CTA B Gastrointestinal: abdomen soft, NT, ND, BS+ Musculoskeletal: no deformities, strength intact in all 4 Skin: moist, warm, no rashes Neurological: no tremor with outstretched hands, DTR normal in all 4  ASSESSMENT: 1. DM2, insulin-dependent, uncontrolled, with  long-term complications -Dupuytren contractions  -Adhesive capsulitis  2. HL  3. Overweight  PLAN:  1. Patient with longstanding, uncontrolled, type 2 diabetes, on oral antidiabetic regimen with Metformin ER and also long-acting insulin and weekly GLP-1 receptor agonist with a very high HbA1c for last visit, 11.1%.  After this, she started [place] but she was not checking sugars at last visit and was difficult to make decisions about the treatment.  She did have some nausea with Ozempic but she was willing to continue to use it.  At that time, I advised her to split the Metformin into 2 doses and we distributed Levemir into a lower dose in the morning and a higher dose at night to help with morning blood sugars.  We held his NovoLog since she was not taking it consistently.  I also sent  a prescription for the freestyle libre CGM to her pharmacy.  She was previously on a Dexcom CGM but this  became very expensive.  She could not get the freestyle libre CGM, but she got a coupon card for this and at today's visit I sent the prescription for the freestyle libre to to her pharmacy as she is determined to obtain it -at this visit she tells me that she ran out of her Levemir approximately 1 month ago.  She did not contact me about this. She did start taking some NovoLog, but this did not help much.  She is taking Metformin and Ozempic. -Reviewing her blood sugars at home, they are very high, up to 500s.  We will restart her insulin, both Levemir and NovoLog. -We discussed that she most likely has insulin deficiency so I strongly advised her not to run out of insulin from now on - I suggested to:  Patient Instructions  Please continue: - Metformin ER 1000 mg 2x a day, with meals - Ozempic 1 mg weekly  Please restart: - Levemir 10 units in am and 20 at night - Novolog 7-10 units 15 min before meals  Please return in 1.5 months with your sugar log.   - we checked her HbA1c: 12.9% (much higher) - advised to check sugars at different times of the day - 4x a day, rotating check times - advised for yearly eye exams >> she is UTD - return to clinic in 1.5 months  2. HL - Reviewed latest lipid panel from 01/2020: All fractions at goal: Lab Results  Component Value Date   CHOL 131 02/01/2020   HDL 46 02/01/2020   LDLCALC 65 02/01/2020   LDLDIRECT 104.2 03/09/2014   TRIG 107 02/01/2020   CHOLHDL 2.8 02/01/2020  - Continues Crestor 5 without side effects.  3.  Overweight -Continue GLP-1 receptor agonist which should also help with weight loss -Weight is stable since last visit - Philemon Kingdom, MD PhD [facility] Endocrinology

## 2020-07-06 ENCOUNTER — Other Ambulatory Visit: Payer: Self-pay | Admitting: Physician Assistant

## 2020-07-06 DIAGNOSIS — F331 Major depressive disorder, recurrent, moderate: Secondary | ICD-10-CM

## 2020-07-06 MED ORDER — BUPROPION HCL ER (XL) 300 MG PO TB24: 300.0000 mg | ORAL_TABLET | Freq: Every day | ORAL | 0 refills | Status: DC

## 2020-07-12 ENCOUNTER — Encounter: Payer: Self-pay | Admitting: Internal Medicine

## 2020-07-12 MED ORDER — INSULIN DETEMIR 100 UNIT/ML FLEXPEN: PEN_INJECTOR | SUBCUTANEOUS | 3 refills | Status: DC

## 2020-07-19 ENCOUNTER — Other Ambulatory Visit: Payer: Self-pay

## 2020-07-24 ENCOUNTER — Other Ambulatory Visit: Payer: Self-pay | Admitting: Family Medicine

## 2020-07-24 DIAGNOSIS — Z1231 Encounter for screening mammogram for malignant neoplasm of breast: Secondary | ICD-10-CM

## 2020-08-15 ENCOUNTER — Encounter: Payer: Self-pay | Admitting: Internal Medicine

## 2020-08-15 ENCOUNTER — Ambulatory Visit: Payer: Self-pay | Admitting: Internal Medicine

## 2020-08-15 ENCOUNTER — Other Ambulatory Visit: Payer: Self-pay

## 2020-08-15 VITALS — BP 120/70 | HR 103 | Ht 66.0 in | Wt 179.2 lb

## 2020-08-15 DIAGNOSIS — E1169 Type 2 diabetes mellitus with other specified complication: Secondary | ICD-10-CM

## 2020-08-15 DIAGNOSIS — E1142 Type 2 diabetes mellitus with diabetic polyneuropathy: Secondary | ICD-10-CM

## 2020-08-15 DIAGNOSIS — E785 Hyperlipidemia, unspecified: Secondary | ICD-10-CM

## 2020-08-15 DIAGNOSIS — E663 Overweight: Secondary | ICD-10-CM

## 2020-08-15 DIAGNOSIS — E1165 Type 2 diabetes mellitus with hyperglycemia: Secondary | ICD-10-CM

## 2020-08-15 NOTE — Progress Notes (Signed)
Patient ID: Lindsey Mason, female   DOB: 1971-06-29, 50 y.o.   MRN: [id number]   This visit occurred during the SARS-CoV-2 public health emergency.  Safety protocols were in place, including screening questions prior to the visit, additional usage of staff PPE, and extensive cleaning of exam room while observing appropriate contact time as indicated for disinfecting solutions.   HPI: Lindsey Mason is a 50 y.o.-year-old female, initially referred by her PCP, Lindsey Mason, [place], PA-C, returning for follow-up for DM2, prev. GDM with triplets in 1997, then dx'ed with DM right after the pregnancy, dx in 2005, insulin-dependent, uncontrolled, with complications (Dupuytren contractions, adhesive capsulitis).  Last visit 1.5 months ago.  Before last visit she came off her Levemir and missed NovoLog doses.  Since then, she restarted them and sugars improved.  Reviewed HbA1c levels: Lab Results  Component Value Date   HGBA1C 12.9 (A) 07/03/2020   HGBA1C 11.1 (H) 02/01/2020   HGBA1C 8.8 (H) 06/28/2019   HGBA1C 6.2 (A) 05/04/2019   HGBA1C 8.4 (A) 02/04/2019   HGBA1C 11.3 (H) 12/25/2018   HGBA1C 5.9 01/27/2018   HGBA1C 9.6 11/04/2017   HGBA1C >14 08/04/2017   HGBA1C 7.2 (H) 03/14/2016   Pt is on a regimen of: - Metformin ER 2000 mg with dinner >> 1000 mg 2x a day - Ozempic 1 mg weekly  -increase 01/2020 - just restarted 1 week ago (was off 2 weeks) - Levemir 10 >> 20 units in am and 20 at night -restarted 07/2020 - Novolog 7-10 >> 7-10-7 units 15 min before meals -restarted 07/2020  Pt.checks her sugars more than 4 times a day with his CGM.    Previously: - am: 150-300 >> 287, 361, 500 - 2h after b'fast: n/c - before lunch: n/c - 2h after lunch: 225-250 >> n/c - before dinner: n/c - 2h after dinner: 225-250, 400, 500 >> 477 - bedtime: n/c - nighttime: n/c Lowest sugar was 60 >> 287 >> 80 x1 she has hypoglycemia awareness at 80.  Highest sugar was 500 in last 4 mo >> 500 >>  350.  Glucometer: One Touch verio.  Pt's meals are -  - Breakfast: Protein bar - Protein shake - Lunch: soup, mashed potato, penne pasta - Dinner: lean meat + green veggies - Snacks: no snacks after dinner  -No CKD, last BUN/creatinine:  Lab Results  Component Value Date   BUN 13 06/28/2019   BUN 10 12/31/2018   CREATININE 0.51 (L) 06/28/2019   CREATININE 0.46 (L) 12/31/2018  Not on ACE inhibitor/ARB.  -+ HL; last set of lipids: Lab Results  Component Value Date   CHOL 131 02/01/2020   HDL 46 02/01/2020   LDLCALC 65 02/01/2020   LDLDIRECT 104.2 03/09/2014   TRIG 107 02/01/2020   CHOLHDL 2.8 02/01/2020  On Crestor 5 mg daily.  - last eye exam was in 10/2019: DR  -+ Numbness and tingling in her right foot  She has Dupuytren contractions and adhesive capsulitis.  Pt has FH of DM in mother and father.  She also has a history of PCOS.  No FH of MTC or personal pancreatitis hx.  ROS: Constitutional: no weight gain/no weight loss, no fatigue, no subjective hyperthermia, no subjective hypothermia Eyes: no blurry vision, no xerophthalmia ENT: no sore throat, no nodules palpated in neck, no dysphagia, no odynophagia, no hoarseness Cardiovascular: no CP/no SOB/no palpitations/no leg swelling Respiratory: no cough/no SOB/no wheezing Gastrointestinal: no N/no V/no D/no C/no acid reflux Musculoskeletal: no muscle aches/no joint aches  Skin: no rashes, no hair loss Neurological: no tremors/+ numbness/+ tingling/no dizziness  I reviewed pt's medications, allergies, PMH, social hx, family hx, and changes were documented in the history of present illness. Otherwise, unchanged from my initial visit note.   Past Medical History:  Diagnosis Date  . Clomid pregnancy 1997, 2005  . Depression   . Diabetes mellitus without mention of complication   . Esophageal reflux   . Headache(784.0)   . Infertility, female    PCOS - Clomid pregnancies   . Ovarian cyst 2019  . PCOS  (polycystic ovarian syndrome) 05/27/2012  . PONV (postoperative nausea and vomiting)    nausea  . Pure hypercholesterolemia   . Rosacea   . Status post appendectomy 11/19/2017    Past Surgical History:  Procedure Laterality Date  . APPENDECTOMY    . CESAREAN SECTION     x 3, last one with triplets  . CHOLECYSTECTOMY N/A 12/25/2018   Procedure: LAPAROSCOPIC CHOLECYSTECTOMY WITH INTRAOPERATIVE CHOLANGIOGRAM;  Surgeon: Jovita Kussmaul, MD;  Location: [facility];  Service: General;  Laterality: N/A;  . LAPAROSCOPIC BILATERAL SALPINGECTOMY Right 02/16/2018   Procedure: LAPAROSCOPIC RIGHT SALPINGECTOMY;  Surgeon: Megan Salon, MD;  Location: [facility];  Service: Gynecology;  Laterality: Right;  . TUBAL LIGATION      Social History   Socioeconomic History  . Marital status: Married    Spouse name: Not on file  . Number of children: 5  . Years of education: Not on file  . Highest education level: Not on file  Occupational History  . Occupation: Risk manager: [employer]  Tobacco Use  . Smoking status: Former Smoker    Packs/day: 1.00    Years: 15.00    Pack years: 15.00    Types: Cigarettes    Quit date: 03/04/2004    Years since quitting: 16.4  . Smokeless tobacco: Never Used  Vaping Use  . Vaping Use: Never used  Substance and Sexual Activity  . Alcohol use: Yes    Comment: occ  . Drug use: No  . Sexual activity: Yes    Birth control/protection: Surgical    Comment: btl  Other Topics Concern  . Not on file  Social History Narrative  . Not on file   Social Determinants of Health   Financial Resource Strain: Not on file  Food Insecurity: Not on file  Transportation Needs: Not on file  Physical Activity: Not on file  Stress: Not on file  Social Connections: Not on file  Intimate Partner Violence: Not on file    Current Outpatient Medications on File Prior to Visit  Medication Sig Dispense Refill  . blood glucose meter kit and  supplies Dispense based on patient and insurance preference. Use in the morning when fasting and 2 hours after largest meal. (FOR ICD-10 E10.9, E11.9). 1 each 0  . buPROPion (WELLBUTRIN XL) 300 MG 24 hr tablet Take 1 tablet (300 mg total) by mouth daily. 90 tablet 0  . Continuous Blood Gluc Receiver (FREESTYLE LIBRE 2 READER) DEVI 1 each by Does not apply route daily. 1 each 0  . Continuous Blood Gluc Sensor (FREESTYLE LIBRE 2 SENSOR) MISC 1 each by Does not apply route every 14 (fourteen) days. 6 each 3  . FLUoxetine (PROZAC) 20 MG tablet Take 0.5 tablets (10 mg total) by mouth daily. (Patient taking differently: Take 20 mg by mouth daily. Taking whole tablet) 90 tablet 1  . fluticasone (FLONASE) 50 MCG/ACT nasal  spray 1 spray each nostril after sinus rinse twice daily (Patient taking differently: Place 1 spray into both nostrils daily.) 16 g 2  . ibuprofen (ADVIL) 200 MG tablet Take 400 mg by mouth every 6 (six) hours as needed for moderate pain.    [name] insulin detemir (LEVEMIR) 100 UNIT/ML FlexPen Use 10 units in am and 20 units in pm under skin 30 mL 3  . metFORMIN (GLUCOPHAGE-XR) 500 MG 24 hr tablet Take 2 tablets (1,000 mg total) by mouth 2 (two) times daily with a meal. 360 tablet 3  . NOVOLOG FLEXPEN 100 UNIT/ML FlexPen Inject 7-10 Units into the skin 3 (three) times daily with meals. 15 mL 3  . ONETOUCH VERIO test strip USE IN THE MORNING WHEN FASTING AND 2 HOURS AFTER LARGEST MEAL 200 each 0  . rosuvastatin (CRESTOR) 5 MG tablet Take 1 tablet (5 mg total) by mouth at bedtime. 90 tablet 1  . Semaglutide, 1 MG/DOSE, (OZEMPIC, 1 MG/DOSE,) 4 MG/3ML SOPN Inject 1 mg into the skin once a week. 9 mL 3  . Vitamin D, Ergocalciferol, (DRISDOL) 1.25 MG (50000 UNIT) CAPS capsule Please take 1 tablet Wednesday and 1 tablet Sunday every week 24 capsule 3   No current facility-administered medications on file prior to visit.    Allergies  Allergen Reactions  . Lantus [Insulin Glargine] Other (See  Comments)    headaches    Family History  Problem Relation Age of Onset  . Cancer Mother        breast  . Heart disease Mother   . Stroke Mother   . Diabetes Mother   . Diabetes Father   . Breast cancer Maternal Aunt   . Colon cancer Neg Hx   . Inflammatory bowel disease Neg Hx   . Liver disease Neg Hx   . Pancreatic cancer Neg Hx   . Stomach cancer Neg Hx   . Esophageal cancer Neg Hx     PE: BP 120/70   Pulse (!) 103   Ht '5\' 6"'  (1.676 m)   Wt 179 lb 3.2 oz (81.3 kg)   SpO2 98%   BMI 28.92 kg/m  Wt Readings from Last 3 Encounters:  08/15/20 179 lb 3.2 oz (81.3 kg)  07/03/20 175 lb 3.2 oz (79.5 kg)  03/24/20 176 lb 6.4 oz (80 kg)   Constitutional: overweight, in NAD Eyes: PERRLA, EOMI, no exophthalmos ENT: moist mucous membranes, no thyromegaly, no cervical lymphadenopathy Cardiovascular: tachycardia, RR, No MRG Respiratory: CTA B Gastrointestinal: abdomen soft, NT, ND, BS+ Musculoskeletal: no deformities, strength intact in all 4 Skin: moist, warm, no rashes Neurological: no tremor with outstretched hands, DTR normal in all 4  ASSESSMENT: 1. DM2, insulin-dependent, uncontrolled, with  long-term complications -Dupuytren contractions  -Adhesive capsulitis  2. HL  3. Overweight  PLAN:  1. Patient with longstanding, uncontrolled, type 2 diabetes, on oral form ER, also weekly GLP-1 receptor agonist and basal-bolus insulin regimen.  At last visit, she was her Levemir after she ran out 1 month prior to the appointment.  She did not contact me about this.  She was also not taking NovoLog consistently.  She continues Metformin and Ozempic.  At that time, sugars are very high, up to 500s.  Her HbA1c was also extremely high, and 12.9%.  I advised her to restart both insulins. -Of note, in the past, we held her NovoLog as her sugars were good despite only taking it sporadically.  However, it appears that she now needs this consistently. -  Previously on a Dexcom CGM, which  she could not afford anymore.  At last visit I sent a prescription for the libre 2 CGM to her pharmacy.  She had a coupon card for it.  She is now on this. CGM interpretation: -At today's visit, we reviewed her CGM downloads: It appears that 31% of values are in target range (goal >70%), while 69% are higher than 180 (goal <25%), and 0% are lower than 70 (goal <4%).  The calculated average blood sugar is 213.  The projected HbA1c for the next 3 months (GMI) is 8.4%, much lower than her HbA1c at last visit, which was 12.9%. -Reviewing the CGM trends, it appears that almost all of her blood sugars are above our target range with some increase in blood sugars after each meal.  Therefore, I advised her to increase her Levemir at night and also her NovoLog with each meal.  She now tells me that she takes 7 units of NovoLog before breakfast and dinner and 10 before lunch.  We will increase these. -For now we will continue Metformin and Ozempic.  She was off Ozempic for 2 weeks, but she just restarted it last week. - I suggested to:  Patient Instructions  Please continue: - Metformin ER 1000 mg 2x a day, with meals - Ozempic 1 mg weekly  Please increase: - Levemir 20 units in am and 30 at night - Novolog 10-16 units 15 min before meals  Please return in 3 months.   - advised to check sugars at different times of the day - 4x a day, rotating check times - advised for yearly eye exams >> she is UTD - she is due for a kidney fxn >> will check at next OV - return to clinic in 3 months  2. HL -Reviewed latest lipid panel from 2021: All fractions at goal Lab Results  Component Value Date   CHOL 131 02/01/2020   HDL 46 02/01/2020   LDLCALC 65 02/01/2020   LDLDIRECT 104.2 03/09/2014   TRIG 107 02/01/2020   CHOLHDL 2.8 02/01/2020  -Continues Crestor 5 without side effects.  3.  Overweight -Continue GLP-1 receptor agonist which should also help with weight loss -Weight was stable at last  visit -gained 4 lbs since then  Philemon Kingdom, MD PhD [facility] Endocrinology

## 2020-08-15 NOTE — Patient Instructions (Addendum)
Please continue: - Metformin ER 1000 mg 2x a day, with meals - Ozempic 1 mg weekly  Please increase: - Levemir 20 units in am and 30 at night - Novolog 10-16 units 15 min before meals  Please return in 3 months.

## 2020-09-18 ENCOUNTER — Telehealth: Payer: BC Managed Care – PPO | Admitting: Physician Assistant

## 2020-09-18 DIAGNOSIS — J069 Acute upper respiratory infection, unspecified: Secondary | ICD-10-CM

## 2020-09-18 MED ORDER — AZELASTINE HCL 0.1 % NA SOLN: 1.0000 | Freq: Two times a day (BID) | NASAL | 0 refills | Status: AC

## 2020-09-18 MED ORDER — BENZONATATE 100 MG PO CAPS: 100.0000 mg | ORAL_CAPSULE | Freq: Three times a day (TID) | ORAL | 0 refills | Status: DC | PRN

## 2020-09-18 NOTE — Progress Notes (Signed)
I have spent 5 minutes in review of e-visit questionnaire, review and updating patient chart, medical decision making and response to patient.   Chaz Mcglasson Cody Donne Robillard, PA-C    

## 2020-09-18 NOTE — Progress Notes (Signed)
We are sorry you are not feeling well.  Here is how we plan to help!  Based on what you have shared with me, it looks like you may have a viral upper respiratory infection.  Upper respiratory infections are caused by a large number of viruses; however, rhinovirus is the most common cause.   Symptoms vary from person to person, with common symptoms including sore throat, cough, fatigue or lack of energy and feeling of general discomfort.  A low-grade fever of up to 100.4 may present, but is often uncommon.  Symptoms vary however, and are closely related to a person's age or underlying illnesses.  The most common symptoms associated with an upper respiratory infection are nasal discharge or congestion, cough, sneezing, headache and pressure in the ears and face.  These symptoms usually persist for about 3 to 10 days, but can last up to 2 weeks.  It is important to know that upper respiratory infections do not cause serious illness or complications in most cases.    Upper respiratory infections can be transmitted from person to person, with the most common method of transmission being a person's hands.  The virus is able to live on the skin and can infect other persons for up to 2 hours after direct contact.  Also, these can be transmitted when someone coughs or sneezes; thus, it is important to cover the mouth to reduce this risk.  To keep the spread of the illness at [place], good hand hygiene is very important.  This is an infection that is most likely caused by a virus. There are no specific treatments other than to help you with the symptoms until the infection runs its course.  We are sorry you are not feeling well.  Here is how we plan to help!   For nasal congestion, you may use an oral decongestants such as Mucinex D or if you have glaucoma or high blood pressure use plain Mucinex.  Saline nasal spray or nasal drops can help and can safely be used as often as needed for congestion.  For your congestion,  I have prescribed Azelastine nasal spray two sprays in each nostril twice a day  If you do not have a history of heart disease, hypertension, diabetes or thyroid disease, prostate/bladder issues or glaucoma, you may also use Sudafed to treat nasal congestion.  It is highly recommended that you consult with a pharmacist or your primary care physician to ensure this medication is safe for you to take.     If you have a cough, you may use cough suppressants such as Delsym and Robitussin.  If you have glaucoma or high blood pressure, you can also use Coricidin HBP.   For cough I have prescribed for you A prescription cough medication called Tessalon Perles 100 mg. You may take 1-2 capsules every 8 hours as needed for cough  If you have a sore or scratchy throat, use a saltwater gargle-  to  teaspoon of salt dissolved in a 4-ounce to 8-ounce glass of warm water.  Gargle the solution for approximately 15-30 seconds and then spit.  It is important not to swallow the solution.  You can also use throat lozenges/cough drops and Chloraseptic spray to help with throat pain or discomfort.  Warm or cold liquids can also be helpful in relieving throat pain.  For headache, pain or general discomfort, you can use Ibuprofen or Tylenol as directed.   Some authorities believe that zinc sprays or the use of  Echinacea may shorten the course of your symptoms.   HOME CARE Only take medications as instructed by your medical team. Be sure to drink plenty of fluids. Water is fine as well as fruit juices, sodas and electrolyte beverages. You may want to stay away from caffeine or alcohol. If you are nauseated, try taking small sips of liquids. How do you know if you are getting enough fluid? Your urine should be a pale yellow or almost colorless. Get rest. Taking a steamy shower or using a humidifier may help nasal congestion and ease sore throat pain. You can place a towel over your head and breathe in the steam from hot  water coming from a faucet. Using a saline nasal spray works much the same way. Cough drops, hard candies and sore throat lozenges may ease your cough. Avoid close contacts especially the very young and the elderly Cover your mouth if you cough or sneeze Always remember to wash your hands.   GET HELP RIGHT AWAY IF: You develop worsening fever. If your symptoms do not improve within 10 days You develop yellow or green discharge from your nose over 3 days. You have coughing fits You develop a severe head ache or visual changes. You develop shortness of breath, difficulty breathing or start having chest pain Your symptoms persist after you have completed your treatment plan  MAKE SURE YOU  Understand these instructions. Will watch your condition. Will get help right away if you are not doing well or get worse.  Your e-visit answers were reviewed by a board certified advanced clinical practitioner to complete your personal care plan. Depending upon the condition, your plan could have included both over the counter or prescription medications. Please review your pharmacy choice. If there is a problem, you may call our nursing hot line at and have the prescription routed to another pharmacy. Your safety is important to us. If you have drug allergies check your prescription carefully.   You can use MyChart to ask questions about today's visit, request a non-urgent call back, or ask for a work or school excuse for 24 hours related to this e-Visit. If it has been greater than 24 hours you will need to follow up with your provider, or enter a new e-Visit to address those concerns. You will get an e-mail in the next two days asking about your experience.  I hope that your e-visit has been valuable and will speed your recovery. Thank you for using e-visits.     

## 2020-09-23 ENCOUNTER — Other Ambulatory Visit: Payer: Self-pay | Admitting: Family Medicine

## 2020-09-23 DIAGNOSIS — F331 Major depressive disorder, recurrent, moderate: Secondary | ICD-10-CM

## 2020-10-11 ENCOUNTER — Telehealth: Payer: Self-pay | Admitting: Physician Assistant

## 2020-10-11 NOTE — Telephone Encounter (Signed)
Patient is going to do a home Covid test and will call with result.

## 2020-10-11 NOTE — Telephone Encounter (Signed)
Patient has post nasal drip and a fever as well as nausea. Please advise, thanks.

## 2020-10-11 NOTE — Telephone Encounter (Signed)
Pt states she has had nasal/sinus congestion for few weeks. Pt states now she is running  a fever. Pt states she has done a negative covid test at home.   Advised patient to go to UC for evaluation and treatment since we are unable to schedule patient until Monday of next week. Patient verbalized understanding and was agreeable. AS, CMA

## 2020-10-11 NOTE — Telephone Encounter (Signed)
Patient's covid test was negative

## 2020-10-11 NOTE — Telephone Encounter (Signed)
Did you ask patient if she has done a negative covid test?

## 2020-10-13 ENCOUNTER — Emergency Department (HOSPITAL_BASED_OUTPATIENT_CLINIC_OR_DEPARTMENT_OTHER)
Admission: EM | Admit: 2020-10-13 | Discharge: 2020-10-13 | Disposition: A | Discharge status: Home / Self Care | Payer: BC Managed Care – PPO | Attending: Emergency Medicine | Admitting: Emergency Medicine

## 2020-10-13 ENCOUNTER — Emergency Department (HOSPITAL_BASED_OUTPATIENT_CLINIC_OR_DEPARTMENT_OTHER): Payer: BC Managed Care – PPO

## 2020-10-13 ENCOUNTER — Encounter (HOSPITAL_BASED_OUTPATIENT_CLINIC_OR_DEPARTMENT_OTHER): Payer: Self-pay | Admitting: *Deleted

## 2020-10-13 ENCOUNTER — Other Ambulatory Visit: Payer: Self-pay

## 2020-10-13 DIAGNOSIS — R42 Dizziness and giddiness: Secondary | ICD-10-CM | POA: Insufficient documentation

## 2020-10-13 DIAGNOSIS — Z7984 Long term (current) use of oral hypoglycemic drugs: Secondary | ICD-10-CM | POA: Diagnosis not present

## 2020-10-13 DIAGNOSIS — R0602 Shortness of breath: Secondary | ICD-10-CM | POA: Insufficient documentation

## 2020-10-13 DIAGNOSIS — E876 Hypokalemia: Secondary | ICD-10-CM | POA: Insufficient documentation

## 2020-10-13 DIAGNOSIS — R0981 Nasal congestion: Secondary | ICD-10-CM | POA: Insufficient documentation

## 2020-10-13 DIAGNOSIS — R000 Tachycardia, unspecified: Secondary | ICD-10-CM | POA: Insufficient documentation

## 2020-10-13 DIAGNOSIS — R091 Pleurisy: Secondary | ICD-10-CM | POA: Insufficient documentation

## 2020-10-13 DIAGNOSIS — M25512 Pain in left shoulder: Secondary | ICD-10-CM | POA: Insufficient documentation

## 2020-10-13 DIAGNOSIS — E785 Hyperlipidemia, unspecified: Secondary | ICD-10-CM | POA: Diagnosis not present

## 2020-10-13 DIAGNOSIS — A084 Viral intestinal infection, unspecified: Secondary | ICD-10-CM | POA: Insufficient documentation

## 2020-10-13 DIAGNOSIS — M549 Dorsalgia, unspecified: Secondary | ICD-10-CM | POA: Diagnosis not present

## 2020-10-13 DIAGNOSIS — E1169 Type 2 diabetes mellitus with other specified complication: Secondary | ICD-10-CM | POA: Insufficient documentation

## 2020-10-13 DIAGNOSIS — R0982 Postnasal drip: Secondary | ICD-10-CM | POA: Diagnosis not present

## 2020-10-13 DIAGNOSIS — Z794 Long term (current) use of insulin: Secondary | ICD-10-CM | POA: Insufficient documentation

## 2020-10-13 DIAGNOSIS — R059 Cough, unspecified: Secondary | ICD-10-CM | POA: Diagnosis not present

## 2020-10-13 DIAGNOSIS — R509 Fever, unspecified: Secondary | ICD-10-CM | POA: Diagnosis present

## 2020-10-13 DIAGNOSIS — Z8616 Personal history of COVID-19: Secondary | ICD-10-CM | POA: Insufficient documentation

## 2020-10-13 DIAGNOSIS — M25552 Pain in left hip: Secondary | ICD-10-CM | POA: Insufficient documentation

## 2020-10-13 DIAGNOSIS — Z79899 Other long term (current) drug therapy: Secondary | ICD-10-CM | POA: Diagnosis not present

## 2020-10-13 DIAGNOSIS — Z87891 Personal history of nicotine dependence: Secondary | ICD-10-CM | POA: Diagnosis not present

## 2020-10-13 DIAGNOSIS — R55 Syncope and collapse: Secondary | ICD-10-CM | POA: Insufficient documentation

## 2020-10-13 LAB — COMPREHENSIVE METABOLIC PANEL
ALT: 22 U/L (ref 0–44)
AST: 20 U/L (ref 15–41)
Albumin: 4.2 g/dL (ref 3.5–5.0)
Alkaline Phosphatase: 72 U/L (ref 38–126)
Anion gap: 14 (ref 5–15)
BUN: 15 mg/dL (ref 6–20)
CO2: 20 mmol/L — ABNORMAL LOW (ref 22–32)
Calcium: 9.2 mg/dL (ref 8.9–10.3)
Chloride: 96 mmol/L — ABNORMAL LOW (ref 98–111)
Creatinine, Ser: 0.61 mg/dL (ref 0.44–1.00)
GFR, Estimated: >60 mL/min (ref >60)
Glucose, Bld: 240 mg/dL — ABNORMAL HIGH (ref 70–99)
Potassium: 2.7 mmol/L — CL (ref 3.5–5.1)
Sodium: 130 mmol/L — ABNORMAL LOW (ref 135–145)
Total Bilirubin: 0.8 mg/dL (ref 0.3–1.2)
Total Protein: 8 g/dL (ref 6.5–8.1)

## 2020-10-13 LAB — CBC WITH DIFFERENTIAL/PLATELET
Abs Immature Granulocytes: 0.04 10*3/uL (ref 0.00–0.07)
Basophils Absolute: 0 10*3/uL (ref 0.0–0.1)
Basophils Relative: 1 %
Eosinophils Absolute: 0.2 10*3/uL (ref 0.0–0.5)
Eosinophils Relative: 2 %
HCT: 47.1 % — ABNORMAL HIGH (ref 36.0–46.0)
Hemoglobin: 16.3 g/dL — ABNORMAL HIGH (ref 12.0–15.0)
Immature Granulocytes: 1 %
Lymphocytes Relative: 14 %
Lymphs Abs: 1.1 10*3/uL (ref 0.7–4.0)
MCH: 29.6 pg (ref 26.0–34.0)
MCHC: 34.6 g/dL (ref 30.0–36.0)
MCV: 85.6 fL (ref 80.0–100.0)
Monocytes Absolute: 0.6 10*3/uL (ref 0.1–1.0)
Monocytes Relative: 8 %
Neutro Abs: 6 10*3/uL (ref 1.7–7.7)
Neutrophils Relative %: 74 %
Platelets: 255 10*3/uL (ref 150–400)
RBC: 5.5 MIL/uL — ABNORMAL HIGH (ref 3.87–5.11)
RDW: 12.9 % (ref 11.5–15.5)
WBC: 8 10*3/uL (ref 4.0–10.5)
nRBC: 0 % (ref 0.0–0.2)

## 2020-10-13 LAB — LIPASE, BLOOD: Lipase: 25 U/L (ref 11–51)

## 2020-10-13 LAB — TROPONIN I (HIGH SENSITIVITY)
Troponin I (High Sensitivity): 2 ng/L (ref <18)
Troponin I (High Sensitivity): 2 ng/L (ref <18)

## 2020-10-13 LAB — MAGNESIUM: Magnesium: 1.9 mg/dL (ref 1.7–2.4)

## 2020-10-13 LAB — D-DIMER, QUANTITATIVE: D-Dimer, Quant: 0.96 ug/mL-FEU — ABNORMAL HIGH (ref 0.00–0.50)

## 2020-10-13 MED ORDER — POTASSIUM CHLORIDE 10 MEQ/100ML IV SOLN
10.0000 meq | INTRAVENOUS | Status: AC
  Administered 2020-10-13 (×2): 10 meq via INTRAVENOUS
  Filled 2020-10-13: qty 100

## 2020-10-13 MED ORDER — POTASSIUM CHLORIDE CRYS ER 20 MEQ PO TBCR
40.0000 meq | EXTENDED_RELEASE_TABLET | Freq: Once | ORAL | Status: AC
  Administered 2020-10-13: 40 meq via ORAL
  Filled 2020-10-13: qty 2

## 2020-10-13 MED ORDER — SODIUM CHLORIDE 0.9 % IV BOLUS
1000.0000 mL | Freq: Once | INTRAVENOUS | Status: AC
  Administered 2020-10-13: 1000 mL via INTRAVENOUS

## 2020-10-13 MED ORDER — IOHEXOL 350 MG/ML SOLN
100.0000 mL | Freq: Once | INTRAVENOUS | Status: AC | PRN
  Administered 2020-10-13: 100 mL via INTRAVENOUS

## 2020-10-13 NOTE — ED Notes (Signed)
Pt verbalizes understanding of discharge instructions. Opportunity for questioning and answers were provided. Armand removed by staff, pt discharged from ED ambulatory to Home via Self.

## 2020-10-13 NOTE — Discharge Instructions (Addendum)
Your symptoms are likely caused by a virus.  You did have a low potassium likely from your diarrhea.  Your blood work did not show any other signs of infection.  You should follow-up with your regular doctor to make sure that your potassium is in a normal range and to assess the cause of your elevated hemoglobin level that we talked about.  Your x-ray does not show any signs of fracture.  You should continue to stay well-hydrated, drink dilute apple juice to keep your electrolytes stable.  If you have worsening of symptoms or you do not have improvement over the next few days, please make sure you see your doctor.

## 2020-10-13 NOTE — ED Notes (Signed)
Redraw of blood sent to lab

## 2020-10-13 NOTE — ED Triage Notes (Signed)
Pt states she has had a fever x 3 days. She feels like she has pneumonia. Painful to exhale. Covid in January. She had a negative home Covid test 2 days ago. States 2 days ago she passed out x 3. She is alert and oriented.

## 2020-10-13 NOTE — ED Notes (Signed)
To CT via stretcher

## 2020-10-13 NOTE — ED Notes (Signed)
MD in to assess

## 2020-10-13 NOTE — ED Provider Notes (Signed)
[facility] Provider Note   CSN: [id number] Arrival date & time: 10/13/20  1817     History Chief Complaint  Patient presents with  . Fever    Lindsey Mason is a 50 y.o. female.  Fever T max 100, 2 days ago 2 days ago also had three episodes of syncope, states that she was "not out very long, but isn't sure how long" These all occurred when she went from a sitting to standing position and were associated with pre-syncope feelings of dizziness Has had this before when she has been sick, tried to vomit, or when her sugar is running low Sugars have been in mid 200s-300s Started having diarrhea two days ago as well, no vomiting Lower abdominal cramping pain No blood in stool In last two days has also had occasionally chest pain in left side, center of chest and mid back Occurs randomly and only when she is exhaling Has occasional shortness of breath and DOE, no history of leg swelling or blood clots Has had a dry cough and post-nasal drip for a few weeks that has not worsened, but isn't improving She had COVID in January and had a negative rapid test at home two days ago  States that first syncopal episode, she was getting up from the toilet after having diarrhea and fell on her left side Left shoulder and left hip have been sore since then States that she does not think she hit her head No changes in vision Not on blood thinners No focal weakness, changes in sensation  Second two episodes where near her bed and she was able to lay down before this occurred  Her daughter is sick currently with cough and congestion         Past Medical History:  Diagnosis Date  . Clomid pregnancy 1997, 2005  . Depression   . Diabetes mellitus without mention of complication   . Esophageal reflux   . Headache(784.0)   . Infertility, female    PCOS - Clomid pregnancies   . Ovarian cyst 2019  . PCOS (polycystic ovarian syndrome) 05/27/2012  . PONV  (postoperative nausea and vomiting)    nausea  . Pure hypercholesterolemia   . Rosacea   . Status post appendectomy 11/19/2017    Patient Active Problem List   Diagnosis Date Noted  . Nausea and vomiting 12/24/2018  . Nausea & vomiting 12/24/2018  . Generalized abdominal pain 12/24/2018  . Statin declined 05/05/2018  . Bloating 03/10/2018  . Alternating constipation and diarrhea 03/10/2018  . Pyrosis 03/10/2018  . Abnormal liver ultrasound 03/10/2018  . Calculus of gallbladder without cholecystitis without obstruction 03/10/2018  . h/o Enlarged ovaries 11/20/2017  . Environmental and seasonal allergies 11/04/2017  . Psychophysiological insomnia 11/04/2017  . Vitamin D deficiency 08/26/2017  . Depression 09/03/2016  . Overweight (BMI 25.0-29.9) 09/03/2016  . Witnessed episode of apnea 09/03/2016  . Urinary frequency 04/03/2016  . Acute maxillary sinusitis 11/04/2014  . 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 PCOS (polycystic ovarian syndrome) 05/27/2012  . 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 Major depressive disorder, recurrent episode ([place]) 08/20/2010  . Hyperlipidemia associated with type 2 diabetes mellitus ([place]) 04/11/2009  . 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 Poorly controlled type 2 diabetes mellitus ([place])- on insulin now for [facility] 03/08/2009  . GERD 03/08/2009    Past Surgical History:  Procedure Laterality Date  . APPENDECTOMY    . CESAREAN SECTION     x 3, last one  with triplets  . CHOLECYSTECTOMY N/A 12/25/2018   Procedure: LAPAROSCOPIC CHOLECYSTECTOMY WITH INTRAOPERATIVE CHOLANGIOGRAM;  Surgeon: Jovita Kussmaul, MD;  Location: [facility];  Service: General;  Laterality: N/A;  . LAPAROSCOPIC BILATERAL SALPINGECTOMY Right 02/16/2018   Procedure: LAPAROSCOPIC RIGHT SALPINGECTOMY;  Surgeon: Megan Salon, MD;  Location[facility];  Service: Gynecology;  Laterality: Right;  . TUBAL LIGATION       OB History    Gravida  3   Para  3   Term  2    Preterm  1   AB      Living  5     SAB      IAB      Ectopic      Multiple  1   Live Births  5           Family History  Problem Relation Age of Onset  . Cancer Mother        breast  . Heart disease Mother   . Stroke Mother   . Diabetes Mother   . Diabetes Father   . Breast cancer Maternal Aunt   . Colon cancer Neg Hx   . Inflammatory bowel disease Neg Hx   . Liver disease Neg Hx   . Pancreatic cancer Neg Hx   . Stomach cancer Neg Hx   . Esophageal cancer Neg Hx     Social History   Tobacco Use  . Smoking status: Former Smoker    Packs/day: 1.00    Years: 15.00    Pack years: 15.00    Types: Cigarettes    Quit date: 03/04/2004    Years since quitting: 16.6  . Smokeless tobacco: Never Used  Vaping Use  . Vaping Use: Never used  Substance Use Topics  . Alcohol use: Yes    Comment: occ  . Drug use: No    Home Medications Prior to Admission medications   Medication Sig Start Date End Date Taking? Authorizing Provider  azelastine (ASTELIN) 0.1 % nasal spray Place 1 spray into both nostrils 2 (two) times daily. Use in each nostril as directed 09/18/20   Brunetta Jeans, PA-C  benzonatate (TESSALON) 100 MG capsule Take 1 capsule (100 mg total) by mouth 3 (three) times daily as needed for cough. 09/18/20   Brunetta Jeans, PA-C  blood glucose meter kit and supplies Dispense based on patient and insurance preference. Use in the morning when fasting and 2 hours after largest meal. (FOR ICD-10 E10.9, E11.9). 12/31/18   Opalski, Neoma Laming, DO  buPROPion (WELLBUTRIN XL) 300 MG 24 hr tablet Take 1 tablet (300 mg total) by mouth daily. 07/06/20   Lorrene Reid, PA-C  Continuous Blood Gluc Receiver (FREESTYLE LIBRE 2 READER) DEVI 1 each by Does not apply route daily. 07/03/20   Philemon Kingdom, MD  Continuous Blood Gluc Sensor (FREESTYLE LIBRE 2 SENSOR) MISC 1 each by Does not apply route every 14 (fourteen) days. 07/03/20   Philemon Kingdom, MD  FLUoxetine (PROZAC) 20  MG tablet Take 0.5 tablets (10 mg total) by mouth daily. Patient taking differently: Take 20 mg by mouth daily. Taking whole tablet 08/10/19 02/02/20  Opalski, Neoma Laming, DO  fluticasone (FLONASE) 50 MCG/ACT nasal spray 1 spray each nostril after sinus rinse twice daily Patient taking differently: Place 1 spray into both nostrils daily. 10/20/17   Opalski, Neoma Laming, DO  ibuprofen (ADVIL) 200 MG tablet Take 400 mg by mouth every 6 (six) hours as needed for moderate pain.  [provider]  insulin detemir (LEVEMIR) 100 UNIT/ML FlexPen Use 10 units in am and 20 units in pm under skin Patient taking differently: Use 20 units in am and 20 units in pm under skin 07/12/20   Philemon Kingdom, MD  metFORMIN (GLUCOPHAGE-XR) 500 MG 24 hr tablet Take 2 tablets (1,000 mg total) by mouth 2 (two) times daily with a meal. 03/24/20   Philemon Kingdom, MD  NOVOLOG FLEXPEN 100 UNIT/ML FlexPen Inject 7-10 Units into the skin 3 (three) times daily with meals. 07/03/20   Philemon Kingdom, MD  ONETOUCH VERIO test strip USE IN THE MORNING WHEN FASTING AND 2 HOURS AFTER LARGEST MEAL 03/22/19   Opalski, Neoma Laming, DO  rosuvastatin (CRESTOR) 5 MG tablet Take 1 tablet (5 mg total) by mouth at bedtime. 07/13/19   Opalski, Deborah, DO  Semaglutide, 1 MG/DOSE, (OZEMPIC, 1 MG/DOSE,) 4 MG/3ML SOPN Inject 1 mg into the skin once a week. 03/24/20   Philemon Kingdom, MD  Vitamin D, Ergocalciferol, (DRISDOL) 1.25 MG (50000 UNIT) CAPS capsule Please take 1 tablet Wednesday and 1 tablet Sunday every week 07/13/19   Mellody Dance, DO    Allergies    Lantus [insulin glargine]  Review of Systems   Review of Systems  Constitutional: Positive for appetite change, fatigue and fever.  HENT: Positive for congestion, postnasal drip and rhinorrhea. Negative for sore throat.   Eyes: Negative for visual disturbance.  Respiratory: Positive for cough and shortness of breath.   Cardiovascular: Positive for chest pain. Negative for palpitations  and leg swelling.  Gastrointestinal: Positive for abdominal pain and diarrhea. Negative for constipation, nausea and vomiting.  Genitourinary: Negative for difficulty urinating, dysuria, flank pain and vaginal discharge.  Musculoskeletal: Positive for back pain and myalgias.  Skin: Negative for rash.  Neurological: Positive for syncope. Negative for weakness, numbness and headaches.    Physical Exam Updated Vital Signs BP 130/74 (BP Location: Right Arm)   Pulse 96   Temp 98 F (36.7 C) (Oral)   Resp 10   Ht 5' 6" (1.676 m)   Wt 78 kg   SpO2 99%   BMI 27.75 kg/m   Physical Exam Vitals reviewed.  Constitutional:      General: She is not in acute distress.    Appearance: Normal appearance. She is not ill-appearing or toxic-appearing.  HENT:     Head: Normocephalic and atraumatic.     Nose: Congestion (mild) present. No rhinorrhea.     Mouth/Throat:     Mouth: Mucous membranes are moist.     Pharynx: No posterior oropharyngeal erythema.  Eyes:     Extraocular Movements: Extraocular movements intact.     Conjunctiva/sclera: Conjunctivae normal.     Pupils: Pupils are equal, round, and reactive to light.  Cardiovascular:     Rate and Rhythm: Normal rate and regular rhythm.     Heart sounds: No murmur heard. No friction rub. No gallop.      Comments: 2+ dorsalis pedis pulses b/l Pulmonary:     Effort: Pulmonary effort is normal.     Breath sounds: Normal breath sounds. No wheezing, rhonchi or rales.     Comments: Slightly decreased breath sounds in LLL Abdominal:     General: Bowel sounds are normal. There is no distension.     Palpations: Abdomen is soft.     Tenderness: There is abdominal tenderness (lower abdomen b/l and suprapubic). There is no right CVA tenderness, left CVA tenderness, guarding or rebound.     Comments: History  of appendectomy and cholecystectomy  Musculoskeletal:        General: Tenderness (TTP left humeral head, full ROM, no obvious deformities or  overlying skin changes, no TTP at elbow, forearm, hand, or wrist.  Tenderness of left greater trochanter also noted without obvious deformity, no limitations in ROM.) present. Normal range of motion.     Cervical back: Normal range of motion and neck supple. No tenderness.     Right lower leg: No edema.     Left lower leg: No edema.  Lymphadenopathy:     Cervical: No cervical adenopathy.  Skin:    General: Skin is warm and dry.     Capillary Refill: Capillary refill takes less than 2 seconds.  Neurological:     General: No focal deficit present.     Mental Status: She is alert and oriented to person, place, and time.     Cranial Nerves: No cranial nerve deficit.     Sensory: No sensory deficit.     Motor: No weakness.  Psychiatric:        Mood and Affect: Mood normal.        Behavior: Behavior normal.     ED Results / Procedures / Treatments   Labs (all labs ordered are listed, but only abnormal results are displayed) Labs Reviewed  COMPREHENSIVE METABOLIC PANEL - Abnormal; Notable for the following components:      Result Value   Sodium 130 (*)    Potassium 2.7 (*)    Chloride 96 (*)    CO2 20 (*)    Glucose, Bld 240 (*)    All other components within normal limits  CBC WITH DIFFERENTIAL/PLATELET - Abnormal; Notable for the following components:   RBC 5.50 (*)    Hemoglobin 16.3 (*)    HCT 47.1 (*)    All other components within normal limits  D-DIMER, QUANTITATIVE (NOT AT [facility]) - Abnormal; Notable for the following components:   D-Dimer, Quant 0.96 (*)    All other components within normal limits  RAPID INFLUENZA A&B ANTIGENS  LIPASE, BLOOD  MAGNESIUM  TROPONIN I (HIGH SENSITIVITY)  TROPONIN I (HIGH SENSITIVITY)    EKG EKG Interpretation  Date/Time:  Friday October 13 2020 18:47:26 EDT Ventricular Rate:  95 PR Interval:  148 QRS Duration: 78 QT Interval:  345 QTC Calculation: 434 R Axis:   -12 Text Interpretation: Sinus rhythm Multiform ventricular premature  complexes Aberrant conduction of SV complex(es) Probable inferior infarct, old No old tracing to compare Confirmed by Deno Etienne [phone]) on 10/13/2020 7:28:40 PM   Radiology DG Chest 2 View  Result Date: 10/13/2020 CLINICAL DATA:  Shortness of breath and chest pain EXAM: CHEST - 2 VIEW COMPARISON:  None. FINDINGS: The heart size and mediastinal contours are within normal limits. Both lungs are clear. The visualized skeletal structures are unremarkable. IMPRESSION: No active cardiopulmonary disease. Electronically Signed   By: Inez Catalina M.D.   On: 10/13/2020 19:53   CT Angio Chest PE W and/or Wo Contrast  Result Date: 10/13/2020 CLINICAL DATA:  Fever EXAM: CT ANGIOGRAPHY CHEST WITH CONTRAST TECHNIQUE: Multidetector CT imaging of the chest was performed using the standard protocol during bolus administration of intravenous contrast. Multiplanar CT image reconstructions and MIPs were obtained to evaluate the vascular anatomy. CONTRAST:  159m OMNIPAQUE IOHEXOL 350 MG/ML SOLN COMPARISON:  None. FINDINGS: Cardiovascular: No filling defects in the pulmonary arteries to suggest pulmonary emboli. Heart is normal size. Aorta is normal caliber. Mediastinum/Nodes: No mediastinal, hilar, or axillary  adenopathy. Trachea and esophagus are unremarkable. Thyroid unremarkable. Lungs/Pleura: Lungs are clear. No focal airspace opacities or suspicious nodules. No effusions. Upper Abdomen: Imaging into the upper abdomen demonstrates no acute findings. Musculoskeletal: Chest wall soft tissues are unremarkable. No acute bony abnormality. Review of the MIP images confirms the above findings. IMPRESSION: No evidence of pulmonary embolus. No acute cardiopulmonary disease. Electronically Signed   By: Rolm Baptise M.D.   On: 10/13/2020 20:45   DG Shoulder Left  Result Date: 10/13/2020 CLINICAL DATA:  Recent fall with left shoulder pain, initial encounter EXAM: LEFT SHOULDER - 2+ VIEW COMPARISON:  None. FINDINGS: There is no  evidence of fracture or dislocation. There is no evidence of arthropathy or other focal bone abnormality. Soft tissues are unremarkable. IMPRESSION: No acute abnormality noted. Electronically Signed   By: Inez Catalina M.D.   On: 10/13/2020 19:56   DG Hip Unilat With Pelvis 2-3 Views Left  Result Date: 10/13/2020 CLINICAL DATA:  Recent fall with left hip pain, initial encounter EXAM: DG HIP (WITH OR WITHOUT PELVIS) 3V LEFT COMPARISON:  None. FINDINGS: Pelvic ring is intact. Prior tubal ligation clips are noted. No acute fracture or dislocation is noted. No soft tissue abnormality is seen. IMPRESSION: No acute abnormality noted. Electronically Signed   By: Inez Catalina M.D.   On: 10/13/2020 19:54    Procedures Procedures   Medications Ordered in ED Medications  potassium chloride 10 mEq in 100 mL IVPB (10 mEq Intravenous New Bag/Given 10/13/20 2143)  sodium chloride 0.9 % bolus 1,000 mL (1,000 mLs Intravenous New Bag/Given 10/13/20 2009)  potassium chloride SA (KLOR-CON) CR tablet 40 mEq (40 mEq Oral Given 10/13/20 2015)  iohexol (OMNIPAQUE) 350 MG/ML injection 100 mL (100 mLs Intravenous Contrast Given 10/13/20 2020)    ED Course  I have reviewed the triage vital signs and the nursing notes.  Pertinent labs & imaging results that were available during my care of the patient were reviewed by me and considered in my medical decision making (see chart for details).    MDM Rules/Calculators/A&P                          Patient is a 44 with uncontrolled T2DM who presents with 2 days of reported temp to 100, body aches, diarrhea, lower abdominal pain, dyspnea on exertion with pleuritic chest pain.  Temperature could be related to viral process.  With negative home COVID test and COVID infection in January, would not repeat as would not change management and could possibly have positive PCR from this infection.  Could consider flu as cause, therefore will obtain rapid antigen test.  Will also obtain  CMP, CBC, lipase to rule out intraabdominal pathology and assess for .  Low suspicion for PE, but given reported DOE and pleuritic-like pain and mild tachycardia on presentation, will obtain D-Dimer and if positive could proceed to CTA.  Will order CXR as well.  Pain is very uncharacteristic of aortic dissection and pulses are equal and strong.  EKG NSR.  Syncope history sounds to be orthostatic, she is neurologically intact on examination, no need for head imaging.  Will obtain left hip and shoulder XR to assess for fracture given falls.   CMP c/w hypokalemia and hyponatremia that corrects to 132 with hyperglycemia to 240.  WBC WNL, she is polycythemic and appears to have been in the past as well, no acute intervention needed, can f/u PCP to determine cause as she no  longer smokes.  Lipase and LFTs WNL.  Cr stable and WNL, no anion gap.  D-Dimer is elevated to 0.96, initial troponin negative at 2.  Will order CTA to assess for PE.  Will also give fluids, IV KCl 2mq x2 and 40 mEq K-Dur to improve hypokalemia.  Will add on Mag.  CXR negative, hip and shoulder XRs also negative.    CTA negative for PE.  Patient updated at bedside.  She denies any further complaints.  Vital signs have remained stable.  Potassium was repleted per above, magnesium was within normal limits.  We will have her follow-up with PCP in 3 days for repeat BMP and to further assess her polycythemia.  Advise follow-up with PCP if her symptoms not improve over the next few days as well.  Return to care if significant worsening of her symptoms or concern for dehydration.  Patient was discharged home in stable condition.   Final Clinical Impression(s) / ED Diagnoses Final diagnoses:  Viral gastroenteritis  Hypokalemia due to excessive gastrointestinal loss of potassium    Rx / DC Orders ED Discharge Orders    None       MCleophas Dunker DO 10/13/20 [place] DO 10/13/20 2245

## 2020-10-13 NOTE — ED Notes (Signed)
Pt ambulated to the bathroom.  

## 2020-11-20 ENCOUNTER — Encounter: Payer: Self-pay | Admitting: Internal Medicine

## 2020-11-22 ENCOUNTER — Ambulatory Visit: Payer: BC Managed Care – PPO | Admitting: Internal Medicine

## 2020-12-18 ENCOUNTER — Encounter: Payer: Self-pay | Admitting: Internal Medicine

## 2020-12-24 ENCOUNTER — Other Ambulatory Visit: Payer: Self-pay

## 2020-12-24 DIAGNOSIS — E1165 Type 2 diabetes mellitus with hyperglycemia: Secondary | ICD-10-CM

## 2020-12-24 DIAGNOSIS — E1142 Type 2 diabetes mellitus with diabetic polyneuropathy: Secondary | ICD-10-CM

## 2020-12-24 MED ORDER — NOVOLOG FLEXPEN 100 UNIT/ML ~~LOC~~ SOPN: 7.0000 [IU] | PEN_INJECTOR | Freq: Three times a day (TID) | SUBCUTANEOUS | 3 refills | Status: DC

## 2020-12-26 ENCOUNTER — Ambulatory Visit: Payer: BC Managed Care – PPO | Admitting: Internal Medicine

## 2020-12-26 NOTE — Progress Notes (Deleted)
Patient ID: Lindsey Mason, female   DOB: 09-13-1970, 50 y.o.   MRN: [id number]   This visit occurred during the SARS-CoV-2 public health emergency.  Safety protocols were in place, including screening questions prior to the visit, additional usage of staff PPE, and extensive cleaning of exam room while observing appropriate contact time as indicated for disinfecting solutions.   HPI: Lindsey Mason is a 50 y.o.-year-old female, initially referred by her PCP, Mariel Kansky, [place], PA-C, returning for follow-up for DM2, prev. GDM with triplets in 1997, then dx'ed with DM right after the pregnancy, dx in 2005, insulin-dependent, uncontrolled, with complications (Dupuytren contractions, adhesive capsulitis).  Last visit 4.5 months ago.  Interim history: In 07/2020, she came off her Levemir and she was missing NovoLog doses.  We restarted these as sugars started to improve.  Reviewed HbA1c levels: Lab Results  Component Value Date   HGBA1C 12.9 (A) 07/03/2020   HGBA1C 11.1 (H) 02/01/2020   HGBA1C 8.8 (H) 06/28/2019   HGBA1C 6.2 (A) 05/04/2019   HGBA1C 8.4 (A) 02/04/2019   HGBA1C 11.3 (H) 12/25/2018   HGBA1C 5.9 01/27/2018   HGBA1C 9.6 11/04/2017   HGBA1C >14 08/04/2017   HGBA1C 7.2 (H) 03/14/2016   Pt is on a regimen of: - Metformin ER 2000 mg with dinner >> 1000 mg 2x a day - Ozempic 1 mg weekly  -increase 01/2020  - Levemir 20 units in am and 30 at night - Novolog 10-16 units 15 min before meals   Pt.checks her sugars more than 4 times a day with his CGM.  Previously:   Lowest sugar was 60 >> 287 >> 80 x1 she has hypoglycemia awareness at 80.  Highest sugar was 500 in last 4 mo >> 500 >> 350.  Glucometer: One Touch verio.  Pt's meals are -  - Breakfast: Protein bar - Protein shake - Lunch: soup, mashed potato, penne pasta - Dinner: lean meat + green veggies - Snacks: no snacks after dinner  -No CKD, last BUN/creatinine:  Lab Results  Component Value Date   BUN 15 10/13/2020    BUN 13 06/28/2019   CREATININE 0.61 10/13/2020   CREATININE 0.51 (L) 06/28/2019  Not on ACE inhibitor/ARB.  -+ HL; last set of lipids: Lab Results  Component Value Date   CHOL 131 02/01/2020   HDL 46 02/01/2020   LDLCALC 65 02/01/2020   LDLDIRECT 104.2 03/09/2014   TRIG 107 02/01/2020   CHOLHDL 2.8 02/01/2020  On Crestor 5 mg daily.  - last eye exam was in 10/2019: DR  -+ Numbness and tingling in her right foot  She has Dupuytren contractions and adhesive capsulitis. She also has a history of PCOS.  Pt has FH of DM in mother and father.  No FH of MTC or personal pancreatitis hx.  ROS: Constitutional: no weight gain/no weight loss, no fatigue, no subjective hyperthermia, no subjective hypothermia Eyes: no blurry vision, no xerophthalmia ENT: no sore throat, no nodules palpated in neck, no dysphagia, no odynophagia, no hoarseness Cardiovascular: no CP/no SOB/no palpitations/no leg swelling Respiratory: no cough/no SOB/no wheezing Gastrointestinal: no N/no V/no D/no C/no acid reflux Musculoskeletal: no muscle aches/no joint aches Skin: no rashes, no hair loss Neurological: no tremors/+ numbness/+ tingling/no dizziness  I reviewed pt's medications, allergies, PMH, social hx, family hx, and changes were documented in the history of present illness. Otherwise, unchanged from my initial visit note.   Past Medical History:  Diagnosis Date   Clomid pregnancy 1997, 2005   Depression  Diabetes mellitus without mention of complication    Esophageal reflux    Headache(784.0)    Infertility, female    PCOS - Clomid pregnancies    Ovarian cyst 2019   PCOS (polycystic ovarian syndrome) 05/27/2012   PONV (postoperative nausea and vomiting)    nausea   Pure hypercholesterolemia    Rosacea    Status post appendectomy 11/19/2017    Past Surgical History:  Procedure Laterality Date   APPENDECTOMY     CESAREAN SECTION     x 3, last one with triplets   CHOLECYSTECTOMY N/A  12/25/2018   Procedure: LAPAROSCOPIC CHOLECYSTECTOMY WITH INTRAOPERATIVE CHOLANGIOGRAM;  Surgeon: Jovita Kussmaul, MD;  Location: [facility];  Service: General;  Laterality: N/A;   LAPAROSCOPIC BILATERAL SALPINGECTOMY Right 02/16/2018   Procedure: LAPAROSCOPIC RIGHT SALPINGECTOMY;  Surgeon: Megan Salon, MD;  Location: [facility];  Service: Gynecology;  Laterality: Right;   TUBAL LIGATION      Social History   Socioeconomic History   Marital status: Married    Spouse name: Not on file   Number of children: 5   Years of education: Not on file   Highest education level: Not on file  Occupational History   Occupation: music minister    Employer: [employer]  Tobacco Use   Smoking status: Former    Packs/day: 1.00    Years: 15.00    Pack years: 15.00    Types: Cigarettes    Quit date: 03/04/2004    Years since quitting: 16.8   Smokeless tobacco: Never  Vaping Use   Vaping Use: Never used  Substance and Sexual Activity   Alcohol use: Yes    Comment: occ   Drug use: No   Sexual activity: Yes    Birth control/protection: Surgical    Comment: btl  Other Topics Concern   Not on file  Social History Narrative   Not on file   Social Determinants of Health   Financial Resource Strain: Not on file  Food Insecurity: Not on file  Transportation Needs: Not on file  Physical Activity: Not on file  Stress: Not on file  Social Connections: Not on file  Intimate Partner Violence: Not on file    Current Outpatient Medications on File Prior to Visit  Medication Sig Dispense Refill   azelastine (ASTELIN) 0.1 % nasal spray Place 1 spray into both nostrils 2 (two) times daily. Use in each nostril as directed 30 mL 0   benzonatate (TESSALON) 100 MG capsule Take 1 capsule (100 mg total) by mouth 3 (three) times daily as needed for cough. 30 capsule 0   blood glucose meter kit and supplies Dispense based on patient and insurance preference. Use in the morning when fasting and  2 hours after largest meal. (FOR ICD-10 E10.9, E11.9). 1 each 0   buPROPion (WELLBUTRIN XL) 300 MG 24 hr tablet Take 1 tablet (300 mg total) by mouth daily. 90 tablet 0   Continuous Blood Gluc Receiver (FREESTYLE LIBRE 2 READER) DEVI 1 each by Does not apply route daily. 1 each 0   Continuous Blood Gluc Sensor (FREESTYLE LIBRE 2 SENSOR) MISC 1 each by Does not apply route every 14 (fourteen) days. 6 each 3   FLUoxetine (PROZAC) 20 MG tablet Take 0.5 tablets (10 mg total) by mouth daily. (Patient taking differently: Take 20 mg by mouth daily. Taking whole tablet) 90 tablet 1   fluticasone (FLONASE) 50 MCG/ACT nasal spray 1 spray each nostril after sinus rinse twice  daily (Patient taking differently: Place 1 spray into both nostrils daily.) 16 g 2   ibuprofen (ADVIL) 200 MG tablet Take 400 mg by mouth every 6 (six) hours as needed for moderate pain.     insulin detemir (LEVEMIR) 100 UNIT/ML FlexPen Use 10 units in am and 20 units in pm under skin (Patient taking differently: Use 20 units in am and 20 units in pm under skin) 30 mL 3   metFORMIN (GLUCOPHAGE-XR) 500 MG 24 hr tablet Take 2 tablets (1,000 mg total) by mouth 2 (two) times daily with a meal. 360 tablet 3   NOVOLOG FLEXPEN 100 UNIT/ML FlexPen Inject 7-10 Units into the skin 3 (three) times daily with meals. 15 mL 3   ONETOUCH VERIO test strip USE IN THE MORNING WHEN FASTING AND 2 HOURS AFTER LARGEST MEAL 200 each 0   rosuvastatin (CRESTOR) 5 MG tablet Take 1 tablet (5 mg total) by mouth at bedtime. 90 tablet 1   Semaglutide, 1 MG/DOSE, (OZEMPIC, 1 MG/DOSE,) 4 MG/3ML SOPN Inject 1 mg into the skin once a week. 9 mL 3   Vitamin D, Ergocalciferol, (DRISDOL) 1.25 MG (50000 UNIT) CAPS capsule Please take 1 tablet Wednesday and 1 tablet Sunday every week 24 capsule 3   No current facility-administered medications on file prior to visit.    Allergies  Allergen Reactions   Lantus [Insulin Glargine] Other (See Comments)    headaches    Family  History  Problem Relation Age of Onset   Cancer Mother        breast   Heart disease Mother    Stroke Mother    Diabetes Mother    Diabetes Father    Breast cancer Maternal Aunt    Colon cancer Neg Hx    Inflammatory bowel disease Neg Hx    Liver disease Neg Hx    Pancreatic cancer Neg Hx    Stomach cancer Neg Hx    Esophageal cancer Neg Hx     PE: There were no vitals taken for this visit. Wt Readings from Last 3 Encounters:  10/13/20 171 lb 14.4 oz (78 kg)  08/15/20 179 lb 3.2 oz (81.3 kg)  07/03/20 175 lb 3.2 oz (79.5 kg)   Constitutional: overweight, in NAD Eyes: PERRLA, EOMI, no exophthalmos ENT: moist mucous membranes, no thyromegaly, no cervical lymphadenopathy Cardiovascular: tachycardia, RR, No MRG Respiratory: CTA B Gastrointestinal: abdomen soft, NT, ND, BS+ Musculoskeletal: no deformities, strength intact in all 4 Skin: moist, warm, no rashes Neurological: no tremor with outstretched hands, DTR normal in all 4  ASSESSMENT: 1. DM2, insulin-dependent, uncontrolled, with  long-term complications -Dupuytren contractions  -Adhesive capsulitis  2. HL  3. Overweight  PLAN:  1. Patient with longstanding, uncontrolled, type 2 diabetes, on oral form ER, also weekly GLP-1 receptor agonist and basal-bolus insulin regimen.  At last visit, she was her Levemir after she ran out 1 month prior to the appointment.  She did not contact me about this.  She was also not taking NovoLog consistently.  She continues Metformin and Ozempic.  At that time, sugars are very high, up to 500s.  Her HbA1c was also extremely high, and 12.9%.  I advised her to restart both insulins. -Of note, in the past, we held her NovoLog as her sugars were good despite only taking it sporadically.  However, it appears that she now needs this consistently. -Previously on a Dexcom CGM, which she could not afford anymore.  At last visit I sent a prescription  for the libre 2 CGM to her pharmacy.  She had a  coupon card for it.  She is now on this.  CGM interpretation: -At today's visit, we reviewed her CGM downloads: It appears that *** of values are in target range (goal >70%), while *** are higher than 180 (goal <25%), and *** are lower than 70 (goal <4%).  The calculated average blood sugar is ***.  The projected HbA1c for the next 3 months (GMI) is ***. -Reviewing the CGM trends, ***  -Reviewing the CGM trends, it appears that almost all of her blood sugars are above our target range with some increase in blood sugars after each meal.  Therefore, I advised her to increase her Levemir at night and also her NovoLog with each meal.  She now tells me that she takes 7 units of NovoLog before breakfast and dinner and 10 before lunch.  We will increase these. -For now we will continue Metformin and Ozempic.  She was off Ozempic for 2 weeks, but she just restarted it last week.  - I suggested to:  Patient Instructions  Please continue: - Metformin ER 1000 mg 2x a day, with meals - Ozempic 1 mg weekly - Levemir 20 units in am and 30 at night - Novolog 10-16 units 15 min before meals   Please return in 3 months.   - we checked her HbA1c: 7%  - advised to check sugars at different times of the day - 4x a day, rotating check times - advised for yearly eye exams >> she is not UTD - return to clinic in 3 months  2. HL -Reviewed latest lipid panel from 01/2020: Fractions at goal: Lab Results  Component Value Date   CHOL 131 02/01/2020   HDL 46 02/01/2020   LDLCALC 65 02/01/2020   LDLDIRECT 104.2 03/09/2014   TRIG 107 02/01/2020   CHOLHDL 2.8 02/01/2020  -Continues Crestor 5 mg daily without side effects  3.  Overweight -We will continue her GLP-1 receptor agonist, Ozempic, 1 mg weekly, which should also help with weight loss -She gained 4 pounds before last visit  Philemon Kingdom, MD PhD [name] Endocrinology

## 2020-12-27 ENCOUNTER — Other Ambulatory Visit: Payer: Self-pay | Admitting: Physician Assistant

## 2020-12-27 DIAGNOSIS — E1142 Type 2 diabetes mellitus with diabetic polyneuropathy: Secondary | ICD-10-CM

## 2021-01-15 ENCOUNTER — Other Ambulatory Visit: Payer: Self-pay | Admitting: Orthopedic Surgery

## 2021-02-08 ENCOUNTER — Other Ambulatory Visit: Payer: Self-pay

## 2021-02-08 ENCOUNTER — Encounter (HOSPITAL_BASED_OUTPATIENT_CLINIC_OR_DEPARTMENT_OTHER): Payer: Self-pay | Admitting: Orthopedic Surgery

## 2021-02-12 ENCOUNTER — Encounter (HOSPITAL_BASED_OUTPATIENT_CLINIC_OR_DEPARTMENT_OTHER)
Admission: RE | Admit: 2021-02-12 | Discharge: 2021-02-12 | Disposition: A | Discharge status: Home / Self Care | Payer: BC Managed Care – PPO | Source: Ambulatory Visit | Attending: Orthopedic Surgery | Admitting: Orthopedic Surgery

## 2021-02-12 DIAGNOSIS — Z8249 Family history of ischemic heart disease and other diseases of the circulatory system: Secondary | ICD-10-CM | POA: Diagnosis not present

## 2021-02-12 DIAGNOSIS — Z791 Long term (current) use of non-steroidal anti-inflammatories (NSAID): Secondary | ICD-10-CM | POA: Diagnosis not present

## 2021-02-12 DIAGNOSIS — M720 Palmar fascial fibromatosis [Dupuytren]: Secondary | ICD-10-CM | POA: Diagnosis not present

## 2021-02-12 DIAGNOSIS — Z833 Family history of diabetes mellitus: Secondary | ICD-10-CM | POA: Diagnosis not present

## 2021-02-12 DIAGNOSIS — Z888 Allergy status to other drugs, medicaments and biological substances status: Secondary | ICD-10-CM | POA: Diagnosis not present

## 2021-02-12 DIAGNOSIS — E119 Type 2 diabetes mellitus without complications: Secondary | ICD-10-CM | POA: Diagnosis not present

## 2021-02-12 DIAGNOSIS — Z794 Long term (current) use of insulin: Secondary | ICD-10-CM | POA: Diagnosis not present

## 2021-02-12 DIAGNOSIS — Z87891 Personal history of nicotine dependence: Secondary | ICD-10-CM | POA: Diagnosis not present

## 2021-02-12 DIAGNOSIS — Z803 Family history of malignant neoplasm of breast: Secondary | ICD-10-CM | POA: Diagnosis not present

## 2021-02-12 LAB — BASIC METABOLIC PANEL
Anion gap: 9 (ref 5–15)
BUN: 13 mg/dL (ref 6–20)
CO2: 24 mmol/L (ref 22–32)
Calcium: 8.8 mg/dL — ABNORMAL LOW (ref 8.9–10.3)
Chloride: 104 mmol/L (ref 98–111)
Creatinine, Ser: 0.5 mg/dL (ref 0.44–1.00)
GFR, Estimated: >60 mL/min (ref >60)
Glucose, Bld: 242 mg/dL — ABNORMAL HIGH (ref 70–99)
Potassium: 4.7 mmol/L (ref 3.5–5.1)
Sodium: 137 mmol/L (ref 135–145)

## 2021-02-12 NOTE — Progress Notes (Signed)

## 2021-02-15 ENCOUNTER — Ambulatory Visit (HOSPITAL_BASED_OUTPATIENT_CLINIC_OR_DEPARTMENT_OTHER): Payer: BC Managed Care – PPO | Admitting: Certified Registered"

## 2021-02-15 ENCOUNTER — Encounter (HOSPITAL_BASED_OUTPATIENT_CLINIC_OR_DEPARTMENT_OTHER): Payer: Self-pay | Admitting: Orthopedic Surgery

## 2021-02-15 ENCOUNTER — Other Ambulatory Visit: Payer: Self-pay

## 2021-02-15 ENCOUNTER — Ambulatory Visit (HOSPITAL_BASED_OUTPATIENT_CLINIC_OR_DEPARTMENT_OTHER)
Admission: RE | Admit: 2021-02-15 | Discharge: 2021-02-15 | Disposition: A | Discharge status: Home / Self Care | Payer: BC Managed Care – PPO | Attending: Orthopedic Surgery | Admitting: Orthopedic Surgery

## 2021-02-15 ENCOUNTER — Encounter (HOSPITAL_BASED_OUTPATIENT_CLINIC_OR_DEPARTMENT_OTHER)
Admission: RE | Disposition: A | Discharge status: Home / Self Care | Payer: Self-pay | Source: Home / Self Care | Attending: Orthopedic Surgery

## 2021-02-15 DIAGNOSIS — Z833 Family history of diabetes mellitus: Secondary | ICD-10-CM | POA: Insufficient documentation

## 2021-02-15 DIAGNOSIS — Z794 Long term (current) use of insulin: Secondary | ICD-10-CM | POA: Insufficient documentation

## 2021-02-15 DIAGNOSIS — M720 Palmar fascial fibromatosis [Dupuytren]: Secondary | ICD-10-CM | POA: Insufficient documentation

## 2021-02-15 DIAGNOSIS — Z888 Allergy status to other drugs, medicaments and biological substances status: Secondary | ICD-10-CM | POA: Insufficient documentation

## 2021-02-15 DIAGNOSIS — Z87891 Personal history of nicotine dependence: Secondary | ICD-10-CM | POA: Insufficient documentation

## 2021-02-15 DIAGNOSIS — E119 Type 2 diabetes mellitus without complications: Secondary | ICD-10-CM | POA: Insufficient documentation

## 2021-02-15 DIAGNOSIS — Z8249 Family history of ischemic heart disease and other diseases of the circulatory system: Secondary | ICD-10-CM | POA: Insufficient documentation

## 2021-02-15 DIAGNOSIS — Z803 Family history of malignant neoplasm of breast: Secondary | ICD-10-CM | POA: Insufficient documentation

## 2021-02-15 DIAGNOSIS — Z791 Long term (current) use of non-steroidal anti-inflammatories (NSAID): Secondary | ICD-10-CM | POA: Insufficient documentation

## 2021-02-15 HISTORY — PX: FASCIECTOMY: SHX6525

## 2021-02-15 LAB — GLUCOSE, CAPILLARY
Glucose-Capillary: 95 mg/dL (ref 70–99)
Glucose-Capillary: 128 mg/dL — ABNORMAL HIGH (ref 70–99)
Glucose-Capillary: 93 mg/dL (ref 70–99)

## 2021-02-15 LAB — POCT PREGNANCY, URINE: Preg Test, Ur: NEGATIVE

## 2021-02-15 SURGERY — FASCIECTOMY, PALM
Anesthesia: Regional | Site: Hand | Laterality: Right

## 2021-02-15 MED ORDER — SCOPOLAMINE 1 MG/3DAYS TD PT72
1.0000 | MEDICATED_PATCH | TRANSDERMAL | Status: DC
  Administered 2021-02-15: 1.5 mg via TRANSDERMAL

## 2021-02-15 MED ORDER — CEFAZOLIN SODIUM-DEXTROSE 2-4 GM/100ML-% IV SOLN
2.0000 g | INTRAVENOUS | Status: AC
  Administered 2021-02-15: 2 g via INTRAVENOUS

## 2021-02-15 MED ORDER — LIDOCAINE 2% (20 MG/ML) 5 ML SYRINGE
INTRAMUSCULAR | Status: DC | PRN
  Administered 2021-02-15: 20 mg via INTRAVENOUS

## 2021-02-15 MED ORDER — BUPIVACAINE HCL (PF) 0.25 % IJ SOLN
INTRAMUSCULAR | Status: AC
  Filled 2021-02-15: qty 60

## 2021-02-15 MED ORDER — LACTATED RINGERS IV SOLN: INTRAVENOUS | Status: DC

## 2021-02-15 MED ORDER — FENTANYL CITRATE (PF) 100 MCG/2ML IJ SOLN: 25.0000 ug | INTRAMUSCULAR | Status: DC | PRN

## 2021-02-15 MED ORDER — OXYCODONE HCL 5 MG/5ML PO SOLN: 5.0000 mg | Freq: Once | ORAL | Status: DC | PRN

## 2021-02-15 MED ORDER — CEFAZOLIN SODIUM-DEXTROSE 2-4 GM/100ML-% IV SOLN
INTRAVENOUS | Status: AC
  Filled 2021-02-15: qty 100

## 2021-02-15 MED ORDER — ONDANSETRON HCL 4 MG/2ML IJ SOLN
INTRAMUSCULAR | Status: DC | PRN
  Administered 2021-02-15: 4 mg via INTRAVENOUS

## 2021-02-15 MED ORDER — PROMETHAZINE HCL 25 MG/ML IJ SOLN
INTRAMUSCULAR | Status: AC
  Filled 2021-02-15: qty 1

## 2021-02-15 MED ORDER — SCOPOLAMINE 1 MG/3DAYS TD PT72
MEDICATED_PATCH | TRANSDERMAL | Status: AC
  Filled 2021-02-15: qty 1

## 2021-02-15 MED ORDER — THROMBIN 5000 UNITS EX SOLR
CUTANEOUS | Status: DC | PRN
  Administered 2021-02-15: 5000 [IU] via TOPICAL

## 2021-02-15 MED ORDER — FENTANYL CITRATE (PF) 100 MCG/2ML IJ SOLN
100.0000 ug | Freq: Once | INTRAMUSCULAR | Status: AC
  Administered 2021-02-15: 50 ug via INTRAVENOUS

## 2021-02-15 MED ORDER — PROMETHAZINE HCL 25 MG/ML IJ SOLN
6.2500 mg | Freq: Once | INTRAMUSCULAR | Status: AC
  Administered 2021-02-15: 6.25 mg via INTRAVENOUS

## 2021-02-15 MED ORDER — ACETAMINOPHEN 160 MG/5ML PO SOLN: 1000.0000 mg | Freq: Once | ORAL | Status: DC | PRN

## 2021-02-15 MED ORDER — ONDANSETRON HCL 4 MG/2ML IJ SOLN
INTRAMUSCULAR | Status: AC
  Filled 2021-02-15: qty 2

## 2021-02-15 MED ORDER — OXYCODONE HCL 5 MG PO TABS: 5.0000 mg | ORAL_TABLET | Freq: Once | ORAL | Status: DC | PRN

## 2021-02-15 MED ORDER — PROPOFOL 500 MG/50ML IV EMUL
INTRAVENOUS | Status: AC
  Filled 2021-02-15: qty 50

## 2021-02-15 MED ORDER — MIDAZOLAM HCL 2 MG/2ML IJ SOLN
2.0000 mg | Freq: Once | INTRAMUSCULAR | Status: AC
  Administered 2021-02-15: 2 mg via INTRAVENOUS

## 2021-02-15 MED ORDER — LIDOCAINE-EPINEPHRINE (PF) 1.5 %-1:200000 IJ SOLN
INTRAMUSCULAR | Status: DC | PRN
  Administered 2021-02-15: 10 mL via PERINEURAL

## 2021-02-15 MED ORDER — PROPOFOL 10 MG/ML IV BOLUS
INTRAVENOUS | Status: DC | PRN
  Administered 2021-02-15: 20 mg via INTRAVENOUS
  Administered 2021-02-15 (×2): 10 mg via INTRAVENOUS

## 2021-02-15 MED ORDER — ACETAMINOPHEN 10 MG/ML IV SOLN: 1000.0000 mg | Freq: Once | INTRAVENOUS | Status: DC | PRN

## 2021-02-15 MED ORDER — ACETAMINOPHEN 500 MG PO TABS: 1000.0000 mg | ORAL_TABLET | Freq: Once | ORAL | Status: DC | PRN

## 2021-02-15 MED ORDER — 0.9 % SODIUM CHLORIDE (POUR BTL) OPTIME
TOPICAL | Status: DC | PRN
  Administered 2021-02-15: 100 mL

## 2021-02-15 MED ORDER — FENTANYL CITRATE (PF) 100 MCG/2ML IJ SOLN
INTRAMUSCULAR | Status: AC
  Filled 2021-02-15: qty 2

## 2021-02-15 MED ORDER — DEXTROSE 50 % IV SOLN
INTRAVENOUS | Status: AC
  Filled 2021-02-15: qty 50

## 2021-02-15 MED ORDER — PROPOFOL 500 MG/50ML IV EMUL
INTRAVENOUS | Status: DC | PRN
  Administered 2021-02-15: 50 ug/kg/min via INTRAVENOUS

## 2021-02-15 MED ORDER — DEXTROSE 50 % IV SOLN
25.0000 mL | Freq: Once | INTRAVENOUS | Status: AC
  Administered 2021-02-15: 25 mL via INTRAVENOUS

## 2021-02-15 MED ORDER — TRAMADOL HCL 50 MG PO TABS
50.0000 mg | ORAL_TABLET | Freq: Four times a day (QID) | ORAL | 0 refills | Status: AC | PRN
Start: 2021-02-15 — End: ?

## 2021-02-15 MED ORDER — PROPOFOL 10 MG/ML IV BOLUS
INTRAVENOUS | Status: AC
  Filled 2021-02-15: qty 20

## 2021-02-15 MED ORDER — BUPIVACAINE-EPINEPHRINE (PF) 0.5% -1:200000 IJ SOLN
INTRAMUSCULAR | Status: DC | PRN
  Administered 2021-02-15: 30 mL via PERINEURAL

## 2021-02-15 MED ORDER — THROMBIN 5000 UNITS EX SOLR
CUTANEOUS | Status: AC
  Filled 2021-02-15: qty 5000

## 2021-02-15 MED ORDER — MIDAZOLAM HCL 2 MG/2ML IJ SOLN
INTRAMUSCULAR | Status: AC
  Filled 2021-02-15: qty 2

## 2021-02-15 SURGICAL SUPPLY — 44 items
APL PRP STRL LF DISP 70% ISPRP (MISCELLANEOUS) ×1
BLADE MINI RND TIP GREEN BEAV (BLADE) ×2 IMPLANT
BLADE SURG 15 STRL LF DISP TIS (BLADE) ×1 IMPLANT
BLADE SURG 15 STRL SS (BLADE) ×2
BNDG CMPR 9X4 STRL LF SNTH (GAUZE/BANDAGES/DRESSINGS) ×1
BNDG COHESIVE 3X5 TAN STRL LF (GAUZE/BANDAGES/DRESSINGS) ×2 IMPLANT
BNDG ESMARK 4X9 LF (GAUZE/BANDAGES/DRESSINGS) ×2 IMPLANT
BNDG GAUZE ELAST 4 BULKY (GAUZE/BANDAGES/DRESSINGS) ×2 IMPLANT
CHLORAPREP W/TINT 26 (MISCELLANEOUS) ×2 IMPLANT
CORD BIPOLAR FORCEPS 12FT (ELECTRODE) ×2 IMPLANT
COVER BACK TABLE 60X90IN (DRAPES) ×2 IMPLANT
COVER MAYO STAND STRL (DRAPES) ×2 IMPLANT
CUFF TOURN SGL QUICK 18X4 (TOURNIQUET CUFF) ×2 IMPLANT
DECANTER SPIKE VIAL GLASS SM (MISCELLANEOUS) IMPLANT
DRAPE EXTREMITY T 121X128X90 (DISPOSABLE) ×2 IMPLANT
DRAPE SURG 17X23 STRL (DRAPES) ×2 IMPLANT
GAUZE SPONGE 4X4 12PLY STRL (GAUZE/BANDAGES/DRESSINGS) ×2 IMPLANT
GAUZE XEROFORM 1X8 LF (GAUZE/BANDAGES/DRESSINGS) ×2 IMPLANT
GLOVE SRG 8 PF TXTR STRL LF DI (GLOVE) ×1 IMPLANT
GLOVE SURG ENC MOIS LTX SZ7.5 (GLOVE) ×2 IMPLANT
GLOVE SURG ORTHO LTX SZ8 (GLOVE) ×2 IMPLANT
GLOVE SURG UNDER POLY LF SZ8 (GLOVE) ×2
GLOVE SURG UNDER POLY LF SZ8.5 (GLOVE) ×2 IMPLANT
GOWN STRL REUS W/ TWL LRG LVL3 (GOWN DISPOSABLE) ×1 IMPLANT
GOWN STRL REUS W/ TWL XL LVL3 (GOWN DISPOSABLE) ×1 IMPLANT
GOWN STRL REUS W/TWL LRG LVL3 (GOWN DISPOSABLE) ×2
GOWN STRL REUS W/TWL XL LVL3 (GOWN DISPOSABLE) ×4 IMPLANT
LOOP VESSEL MAXI BLUE (MISCELLANEOUS) ×2 IMPLANT
NEEDLE PRECISIONGLIDE 27X1.5 (NEEDLE) ×2 IMPLANT
NS IRRIG 1000ML POUR BTL (IV SOLUTION) ×2 IMPLANT
PACK BASIN DAY SURGERY FS (CUSTOM PROCEDURE TRAY) ×2 IMPLANT
PAD CAST 3X4 CTTN HI CHSV (CAST SUPPLIES) ×1 IMPLANT
PADDING CAST COTTON 3X4 STRL (CAST SUPPLIES) ×2
SLEEVE SCD COMPRESS KNEE MED (STOCKING) ×2 IMPLANT
SLING ARM FOAM STRAP MED (SOFTGOODS) ×2 IMPLANT
SPLINT PLASTER CAST XFAST 3X15 (CAST SUPPLIES) ×10 IMPLANT
SPLINT PLASTER XTRA FASTSET 3X (CAST SUPPLIES) ×10
STOCKINETTE 4X48 STRL (DRAPES) ×2 IMPLANT
SUT ETHILON 4 0 PS 2 18 (SUTURE) ×6 IMPLANT
SUT SILK 2 0 PERMA HAND 18 BK (SUTURE) IMPLANT
SYR BULB EAR ULCER 3OZ GRN STR (SYRINGE) ×2 IMPLANT
SYR CONTROL 10ML LL (SYRINGE) ×2 IMPLANT
TOWEL GREEN STERILE FF (TOWEL DISPOSABLE) ×4 IMPLANT
UNDERPAD 30X36 HEAVY ABSORB (UNDERPADS AND DIAPERS) ×2 IMPLANT

## 2021-02-15 NOTE — Anesthesia Preprocedure Evaluation (Addendum)
Anesthesia Evaluation  Patient identified by MRN, date of birth, ID band Patient awake    Reviewed: Allergy & Precautions, NPO status , Patient's Chart, lab work & pertinent test results  History of Anesthesia Complications (+) PONV and history of anesthetic complications  Airway Mallampati: II  TM Distance: >3 FB Neck ROM: Full    Dental no notable dental hx.    Pulmonary former smoker,    Pulmonary exam normal breath sounds clear to auscultation       Cardiovascular Normal cardiovascular exam Rhythm:Regular Rate:Normal     Neuro/Psych    GI/Hepatic   Endo/Other  diabetes, Type 2  Renal/GU      Musculoskeletal   Abdominal   Peds  Hematology   Anesthesia Other Findings   Reproductive/Obstetrics                            Anesthesia Physical Anesthesia Plan  ASA: 3  Anesthesia Plan: MAC and Regional   Post-op Pain Management:    Induction: Intravenous  PONV Risk Score and Plan:   Airway Management Planned:   Additional Equipment:   Intra-op Plan:   Post-operative Plan: Extubation in OR  Informed Consent: I have reviewed the patients History and Physical, chart, labs and discussed the procedure including the risks, benefits and alternatives for the proposed anesthesia with the patient or authorized representative who has indicated his/her understanding and acceptance.     Dental advisory given  Plan Discussed with: CRNA  Anesthesia Plan Comments:         Anesthesia Quick Evaluation

## 2021-02-15 NOTE — H&P (Signed)
ETHERINE Mason is an 50 y.o. female.   Chief Complaint: finger contractures HPI: Lindsey Mason is a 50 yo female with Dupuytren's disease primarily right side is post fasciectomy on her left side done in 2020. She has cords present to her right side. These have shown minimal deformity. She is not complaining of any numbness or tingling. She is not complaining of any catching has noticed the beginning of a cord forming in her first webspace on her left side.  Past Medical History:  Diagnosis Date   Clomid pregnancy 1997, 2005   Depression    Diabetes mellitus without mention of complication    Esophageal reflux    Headache(784.0)    Infertility, female    PCOS - Clomid pregnancies    Ovarian cyst 2019   PCOS (polycystic ovarian syndrome) 05/27/2012   PONV (postoperative nausea and vomiting)    nausea   Pure hypercholesterolemia    Rosacea    Status post appendectomy 11/19/2017    Past Surgical History:  Procedure Laterality Date   APPENDECTOMY     CESAREAN SECTION     x 3, last one with triplets   CHOLECYSTECTOMY N/A 12/25/2018   Procedure: LAPAROSCOPIC CHOLECYSTECTOMY WITH INTRAOPERATIVE CHOLANGIOGRAM;  Surgeon: Jovita Kussmaul, MD;  Location: [facility];  Service: General;  Laterality: N/A;   LAPAROSCOPIC BILATERAL SALPINGECTOMY Right 02/16/2018   Procedure: LAPAROSCOPIC RIGHT SALPINGECTOMY;  Surgeon: Megan Salon, MD;  Location: [facility];  Service: Gynecology;  Laterality: Right;   TUBAL LIGATION      Family History  Problem Relation Age of Onset   Cancer Mother        breast   Heart disease Mother    Stroke Mother    Diabetes Mother    Diabetes Father    Breast cancer Maternal Aunt    Colon cancer Neg Hx    Inflammatory bowel disease Neg Hx    Liver disease Neg Hx    Pancreatic cancer Neg Hx    Stomach cancer Neg Hx    Esophageal cancer Neg Hx    Social History:  reports that she quit smoking about 16 years ago. Her smoking use included cigarettes. She has a  15.00 pack-year smoking history. She has never used smokeless tobacco. She reports current alcohol use. She reports that she does not use drugs.  Allergies:  Allergies  Allergen Reactions   Lantus [Insulin Glargine] Other (See Comments)    headaches    Medications Prior to Admission  Medication Sig Dispense Refill   azelastine (ASTELIN) 0.1 % nasal spray Place 1 spray into both nostrils 2 (two) times daily. Use in each nostril as directed 30 mL 0   Continuous Blood Gluc Sensor (FREESTYLE LIBRE 2 SENSOR) MISC 1 each by Does not apply route every 14 (fourteen) days. 6 each 3   fluticasone (FLONASE) 50 MCG/ACT nasal spray 1 spray each nostril after sinus rinse twice daily (Patient taking differently: Place 1 spray into both nostrils daily.) 16 g 2   ibuprofen (ADVIL) 200 MG tablet Take 400 mg by mouth every 6 (six) hours as needed for moderate pain.     insulin detemir (LEVEMIR) 100 UNIT/ML FlexPen Use 10 units in am and 20 units in pm under skin (Patient taking differently: Use 20 units in am and 20 units in pm under skin) 30 mL 3   metFORMIN (GLUCOPHAGE-XR) 500 MG 24 hr tablet Take 2 tablets (1,000 mg total) by mouth 2 (two) times daily with a meal.  360 tablet 3   NOVOLOG FLEXPEN 100 UNIT/ML FlexPen Inject 7-10 Units into the skin 3 (three) times daily with meals. 15 mL 3   benzonatate (TESSALON) 100 MG capsule Take 1 capsule (100 mg total) by mouth 3 (three) times daily as needed for cough. 30 capsule 0   blood glucose meter kit and supplies Dispense based on patient and insurance preference. Use in the morning when fasting and 2 hours after largest meal. (FOR ICD-10 E10.9, E11.9). 1 each 0   Continuous Blood Gluc Receiver (FREESTYLE LIBRE 2 READER) DEVI 1 each by Does not apply route daily. 1 each 0   ONETOUCH VERIO test strip USE IN THE MORNING WHEN FASTING AND 2 HOURS AFTER LARGEST MEAL 200 each 0    Results for orders placed or performed during the hospital encounter of 02/15/21 (from the  past 48 hour(s))  Pregnancy, urine POC     Status: None   Collection Time: 02/15/21  7:56 AM  Result Value Ref Range   Preg Test, Ur NEGATIVE NEGATIVE    Comment:        THE SENSITIVITY OF THIS METHODOLOGY IS >24 mIU/mL     No results found.   Pertinent items are noted in HPI.  Blood pressure 129/90, pulse 88, temperature 98.3 F (36.8 C), temperature source Oral, resp. rate 17, height _0  (1.676 m), weight 89.4 kg, SpO2 99 %.  General appearance: alert, cooperative, and appears stated age Head: Normocephalic, without obvious abnormality Neck: no JVD and thyroid not enlarged, symmetric, no tenderness/mass/nodules Resp: clear to auscultation bilaterally Cardio: regular rate and rhythm GI: soft, non-tender; bowel sounds normal; no masses,  no organomegaly Extremities: finger contractures middle, ring and small Pulses: 2+ and symmetric Skin: Skin color, texture, turgor normal. No rashes or lesions Neurologic: Grossly normal Incision/Wound: na  Assessment/Plan Assessment:  1. Contracture of palmar fascia    Plan: She would like to proceed to have this operated on. Prepare and postoperative course been discussed along with risk and complications. She is aware that there is no guarantee to the surgery the possibility of infection recurrence injury to arteries nerves tendons incomplete relief symptoms dystrophy with possible loss of finger possibility significant chance of recurrence. She is scheduled for right hand middle ring and small finger Dupuytren's fasciectomy as an outpatient under regional anesthesia.   Daryll Brod 02/15/2021, 8:36 AM

## 2021-02-15 NOTE — Op Note (Signed)
I assisted Surgeon(s) and Role:    * Daryll Brod, MD - Primary    Leanora Cover, MD - Assisting on the Procedure(s): FASCIECTOMY RIGHT MIDDLE, RING AND SMALL FINGERS on 02/15/2021.  I provided assistance on this case as follows: retraction soft tissues, identification and protection of structures.  Electronically signed by: Leanora Cover, MD Date: 02/15/2021 Time: 11:13 AM

## 2021-02-15 NOTE — Anesthesia Procedure Notes (Signed)
Anesthesia Regional Block: Axillary brachial plexus block   Pre-Anesthetic Checklist: , timeout performed,  Correct Patient, Correct Site, Correct Laterality,  Correct Procedure, Correct Position, site marked,  Risks and benefits discussed,  Surgical consent,  Pre-op evaluation,  At surgeon's request and post-op pain management  Laterality: Right and Upper  Prep: chloraprep       Needles:  Injection technique: Single-shot      Needle Length: 9cm  Needle Gauge: 22     Additional Needles: Arrow StimuQuik ECHO Echogenic Stimulating PNB Needle  Procedures:,,,, ultrasound used (permanent image in chart),,    Narrative:  Start time: 02/15/2021 8:53 AM End time: 02/15/2021 8:58 AM Injection made incrementally with aspirations every 5 mL.  Performed by: Personally  Anesthesiologist: Oleta Mouse, MD

## 2021-02-15 NOTE — Brief Op Note (Signed)
02/15/2021  11:14 AM  PATIENT:  Lindsey Mason  50 y.o. female  PRE-OPERATIVE DIAGNOSIS:  DUPUYTREN'S CONTRACTURE RIGHT MIDDLE, RING AND SMALL FINGERS  POST-OPERATIVE DIAGNOSIS:  DUPUYTREN'S CONTRACTURE RIGHT MIDDLE, RING AND SMALL FINGERS  PROCEDURE:  Procedure(s) with comments: FASCIECTOMY RIGHT MIDDLE, RING AND SMALL FINGERS (Right) - AXILLARY BLOCK  SURGEON:  Surgeon(s) and Role:    * Daryll Brod, MD - Primary    * Leanora Cover, MD - Assisting  PHYSICIAN ASSISTANT:   ASSISTANTS: K Rosabelle Jupin,MD   ANESTHESIA:   regional and IV sedation  EBL:  8m  BLOOD ADMINISTERED:none  DRAINS:  vessel loops    LOCAL MEDICATIONS USED:  NONE  SPECIMEN:  Excision  DISPOSITION OF SPECIMEN:  PATHOLOGY  COUNTS:  YES  TOURNIQUET:   Total Tourniquet Time Documented: Upper Arm (Right) - 64 minutes Total: Upper Arm (Right) - 64 minutes   DICTATION: .DViviann SpareDictation  PLAN OF CARE: Discharge to home after PACU  PATIENT DISPOSITION:  PACU - hemodynamically stable.

## 2021-02-15 NOTE — Transfer of Care (Signed)
Immediate Anesthesia Transfer of Care Note  Patient: Lindsey Mason  Procedure(s) Performed: FASCIECTOMY RIGHT MIDDLE, RING AND SMALL FINGERS (Right: Hand)  Patient Location: PACU  Anesthesia Type:MAC combined with regional for post-op pain  Level of Consciousness: awake, alert  and oriented  Airway & Oxygen Therapy: Patient Spontanous Breathing and Patient connected to face mask oxygen  Post-op Assessment: Report given to RN  Post vital signs: Reviewed and stable  Last Vitals:  Vitals Value Taken Time  BP    Temp    Pulse 76 02/15/21 1123  Resp 13 02/15/21 1123  SpO2 100 % 02/15/21 1123  Vitals shown include unvalidated device data.  Last Pain:  Vitals:   02/15/21 1117  TempSrc:   PainSc: 0-No pain         Complications: No notable events documented.

## 2021-02-15 NOTE — Op Note (Signed)
NAME: Lindsey Mason RECORD NO: [id number] DATE OF BIRTH: 1970/10/19 FACILITY: [facility] LOCATION: [facility] PHYSICIAN: Wynonia Sours, MD   OPERATIVE REPORT   DATE OF PROCEDURE: 02/15/21    PREOPERATIVE DIAGNOSIS: Dupuytren's contracture right middle right ring right small fingers   POSTOPERATIVE DIAGNOSIS: Same   PROCEDURE: Palmar fasciectomy with V-Y advancement flaps middle ring and small fingers right hand   SURGEON: Daryll Brod, M.D.   ASSISTANT: Leanora Cover, MD   ANESTHESIA:  Regional with sedation   INTRAVENOUS FLUIDS:  Per anesthesia flow sheet.   ESTIMATED BLOOD LOSS:  Minimal.   COMPLICATIONS:  None.   SPECIMENS: Palmar fascia   TOURNIQUET TIME:    Total Tourniquet Time Documented: Upper Arm (Right) - 64 minutes Total: Upper Arm (Right) - 64 minutes    DISPOSITION:  Stable to PACU.   INDICATIONS: Patient is a 50 year old female with a history of Dupuytren's contracture bilateral hands.  She has undergone fasciectomy on her left side is admitted now for treatment of her right middle ring and small fingers.  Pre-.  Postoperative course been discussed along with risks and complications.  She is aware that there is no guarantee to the surgery the possibility of infection recurrence injury to arteries nerves tendons complete relief symptoms just possibility of loss of the finger from intervention.  The significant recurrence rate regardless of treatment type.  In preoperative area the patient seen extremity marked by both patient and surgeon antibiotic given.  A supraclavicular block was carried out without difficulty under the direction the anesthesia department.  OPERATIVE COURSE: Patient is brought to the operating room placed in supine position with the right arm free.  Prep was done with ChloraPrep 3-minute dry time was allowed timeout taken to confirm patient procedure.  The limb was exsanguinated with an Esmarch bandage turn placed on the arm  was inflated to 250 mmHg.  The ring finger was approached first.  A Bruner incision was made from proximal to distal this was extended as neurovascular bundles were identified radially and ulnarly from the proximal to distal direction.  A central cord was present along with the natatory cords to the middle finger.  This was dissected out to the middle phalanx with a large lateral digital sheet cord present on the radial border the cord was dissected underneath the skin to the middle finger.  A separate incision was then made and then the middle finger Brunner and nature with apex towards the ring finger.  This allowed isolation of the cord which was primarily mandatory in nature to the middle finger.  This was removed protecting neurovascular bundles on both the radial and ulnar aspect of the middle and ring fingers.  Cords were identified proximally to the middle finger and this was dissected free and removed proximally.  The small finger was attended to next.  A volar Bruner incision was made from proximal to distal direction a significant tendon central cord along with a abductor digiti quinti cord was present.  The neurovascular bundles were identified.  The cords were dissected free followed out to the middle phalanx with the abductor digiti quinti cord at the proximal phalanx on the radial central cord.  The vascular bundles were then explored over their entire course found to be intact on each finger.  The small finger PIP joint came entirely straight.  The wound was copious irrigated with saline on each finger and thrombin was then instilled vessel loop drains were placed up and  over nature these were converted to wise and the wounds were closed on each of the digits with interrupted 4-0 nylon sutures.  A sterile compressive dressing was applied.  The tourniquet was deflated all fingers immediately pinked up.  A dorsal splint was applied.  The dressing completed and the patient taken to the recovery room for  observation in satisfactory condition.  She will be discharged home to return to the hand center [facility] in 1 week on Tylenol ibuprofen for pain with Ultram for breakthrough.   Daryll Brod, MD Electronically signed, 02/15/21

## 2021-02-15 NOTE — Progress Notes (Signed)
Assisted Dr. Moser with right, ultrasound guided, axillary block. Side rails up, monitors on throughout procedure. See vital signs in flow sheet. Tolerated Procedure well. 

## 2021-02-15 NOTE — Anesthesia Postprocedure Evaluation (Signed)
Anesthesia Post Note  Patient: SHIVON SULKOWSKI  Procedure(s) Performed: FASCIECTOMY RIGHT MIDDLE, RING AND SMALL FINGERS (Right: Hand)     Patient location during evaluation: PACU Anesthesia Type: General Level of consciousness: awake and alert Pain management: pain level controlled Vital Signs Assessment: post-procedure vital signs reviewed and stable Respiratory status: spontaneous breathing, nonlabored ventilation and respiratory function stable Cardiovascular status: blood pressure returned to baseline and stable Postop Assessment: no apparent nausea or vomiting Anesthetic complications: no   No notable events documented.  Last Vitals:  Vitals:   02/15/21 1200 02/15/21 1231  BP: 114/70 121/76  Pulse: 78 79  Resp: 12 16  Temp:  36.6 C  SpO2: 94% 95%    Last Pain:  Vitals:   02/15/21 1231  TempSrc:   PainSc: 0-No pain                 Lynda Rainwater

## 2021-02-15 NOTE — Discharge Instructions (Addendum)

## 2021-02-16 ENCOUNTER — Encounter (HOSPITAL_BASED_OUTPATIENT_CLINIC_OR_DEPARTMENT_OTHER): Payer: Self-pay | Admitting: Orthopedic Surgery

## 2021-02-16 LAB — SURGICAL PATHOLOGY

## 2021-03-20 ENCOUNTER — Telehealth: Payer: BC Managed Care – PPO | Admitting: Physician Assistant

## 2021-03-20 DIAGNOSIS — B9689 Other specified bacterial agents as the cause of diseases classified elsewhere: Secondary | ICD-10-CM

## 2021-03-20 DIAGNOSIS — J208 Acute bronchitis due to other specified organisms: Secondary | ICD-10-CM | POA: Diagnosis not present

## 2021-03-20 MED ORDER — DOXYCYCLINE HYCLATE 100 MG PO TABS: 100.0000 mg | ORAL_TABLET | Freq: Two times a day (BID) | ORAL | 0 refills | Status: DC

## 2021-03-20 MED ORDER — BENZONATATE 100 MG PO CAPS
100.0000 mg | ORAL_CAPSULE | Freq: Three times a day (TID) | ORAL | 0 refills | Status: DC | PRN
Start: 2021-03-20 — End: 2022-03-25

## 2021-03-20 NOTE — Progress Notes (Signed)
I have spent 5 minutes in review of e-visit questionnaire, review and updating patient chart, medical decision making and response to patient.   Hideko Esselman Cody Klyn Kroening, PA-C    

## 2021-03-20 NOTE — Progress Notes (Signed)

## 2021-09-05 ENCOUNTER — Encounter: Payer: Self-pay | Admitting: Internal Medicine

## 2021-09-05 ENCOUNTER — Ambulatory Visit: Payer: BC Managed Care – PPO | Admitting: Internal Medicine

## 2021-09-05 ENCOUNTER — Other Ambulatory Visit: Payer: Self-pay

## 2021-09-05 VITALS — BP 120/82 | HR 80 | Ht 66.0 in | Wt 177.0 lb

## 2021-09-05 DIAGNOSIS — E785 Hyperlipidemia, unspecified: Secondary | ICD-10-CM

## 2021-09-05 DIAGNOSIS — E1169 Type 2 diabetes mellitus with other specified complication: Secondary | ICD-10-CM | POA: Diagnosis not present

## 2021-09-05 DIAGNOSIS — E1142 Type 2 diabetes mellitus with diabetic polyneuropathy: Secondary | ICD-10-CM

## 2021-09-05 DIAGNOSIS — E1165 Type 2 diabetes mellitus with hyperglycemia: Secondary | ICD-10-CM

## 2021-09-05 DIAGNOSIS — E663 Overweight: Secondary | ICD-10-CM

## 2021-09-05 LAB — POCT GLYCOSYLATED HEMOGLOBIN (HGB A1C): Hemoglobin A1C: 12.9 % — AB (ref 4.0–5.6)

## 2021-09-05 MED ORDER — INSULIN PEN NEEDLE 32G X 4 MM MISC: 3 refills | Status: AC

## 2021-09-05 MED ORDER — METFORMIN HCL ER 500 MG PO TB24: 1000.0000 mg | ORAL_TABLET | Freq: Two times a day (BID) | ORAL | 3 refills | Status: DC

## 2021-09-05 MED ORDER — INSULIN DETEMIR 100 UNIT/ML FLEXPEN: PEN_INJECTOR | SUBCUTANEOUS | 3 refills | Status: DC

## 2021-09-05 MED ORDER — FREESTYLE LIBRE 3 SENSOR MISC: 1.0000 | 3 refills | Status: DC

## 2021-09-05 MED ORDER — NOVOLOG FLEXPEN 100 UNIT/ML ~~LOC~~ SOPN: 8.0000 [IU] | PEN_INJECTOR | Freq: Three times a day (TID) | SUBCUTANEOUS | 3 refills | Status: DC

## 2021-09-05 MED ORDER — ROSUVASTATIN CALCIUM 5 MG PO TABS: 5.0000 mg | ORAL_TABLET | Freq: Every day | ORAL | 3 refills | Status: AC

## 2021-09-05 NOTE — Progress Notes (Signed)
Patient ID: Lindsey Mason, female   DOB: [birth year], 51 y.o.   MRN: [id number]   This visit occurred during the SARS-CoV-2 public health emergency.  Safety protocols were in place, including screening questions prior to the visit, additional usage of staff PPE, and extensive cleaning of exam room while observing appropriate contact time as indicated for disinfecting solutions.   HPI: Lindsey Mason is a 50 y.o.-year-old female, initially referred by her PCP, Abonza, Maritza, PA-C, returning for follow-up for DM2, prev. GDM with triplets in 1997, then dx'ed with DM right after the pregnancy, dx in 2005, insulin-dependent, uncontrolled, with complications (Dupuytren contractions, adhesive capsulitis).  Last visit 1 year and 0.5 months ago.  Interim history: + increased urination, blurry vision. No nausea, chest pain.  Reviewed HbA1c levels: Lab Results  Component Value Date   HGBA1C 12.9 (A) 07/03/2020   HGBA1C 11.1 (H) 02/01/2020   HGBA1C 8.8 (H) 06/28/2019   HGBA1C 6.2 (A) 05/04/2019   HGBA1C 8.4 (A) 02/04/2019   HGBA1C 11.3 (H) 12/25/2018   HGBA1C 5.9 01/27/2018   HGBA1C 9.6 11/04/2017   HGBA1C >14 08/04/2017   HGBA1C 7.2 (H) 03/14/2016   Pt is on a regimen of: - Metformin ER 2000 mg with dinner >> 1000 mg 2x a day >> takes it occasionally - Ozempic 1 mg weekly  -increase 01/2020 >> stopped 2/2 nausea, diarrhea - Levemir 10 >> 20 units in am and 20 at night -restarted 07/2020 >> 20 units in a.m. and 30 units at night >> every once in a while, 30 units 2x a day - Novolog 7-10 >> 7-10-7 units 15 min before meals -restarted 07/2020 >> 10-16 units before each meal >> not using   Pt.checks her sugars more than 4 times a day with her CGM:  Previously:   Lowest sugar was 60 >> 287 >> 80 x1 >> 150; she has hypoglycemia awareness at 80.  Highest sugar was 500 >> 500 >> 350 >> 400s.  Glucometer: One Touch verio.  Pt's meals are -  - Breakfast: Protein bar - Protein shake - Lunch: soup,  mashed potato, penne pasta - Dinner: lean meat + green veggies - Snacks: no snacks after dinner  -No CKD, last BUN/creatinine:  Lab Results  Component Value Date   BUN 13 02/12/2021   BUN 15 10/13/2020   CREATININE 0.50 02/12/2021   CREATININE 0.61 10/13/2020  Not on ACE inhibitor/ARB.  -+ HL; last set of lipids: Lab Results  Component Value Date   CHOL 131 02/01/2020   HDL 46 02/01/2020   LDLCALC 65 02/01/2020   LDLDIRECT 104.2 03/09/2014   TRIG 107 02/01/2020   CHOLHDL 2.8 02/01/2020  On Crestor 5 mg daily.  - last eye exam was in 10/2019: DR  -+ Numbness and tingling in her right foot.  She has Dupuytren contractions and adhesive capsulitis. She also has a history of PCOS.  Pt has FH of DM in mother and father. No FH of MTC or personal pancreatitis hx.  ROS: + see HPI Neurological: no tremors/+ numbness/+ tingling/no dizziness  I reviewed pt's medications, allergies, PMH, social hx, family hx, and changes were documented in the history of present illness. Otherwise, unchanged from my initial visit note.   Past Medical History:  Diagnosis Date   Clomid pregnancy 1997, 2005   Depression    Diabetes mellitus without mention of complication    Esophageal reflux    Headache(784.0)    Infertility, female    PCOS - Clomid pregnancies  Ovarian cyst 2019   PCOS (polycystic ovarian syndrome) 05/27/2012   PONV (postoperative nausea and vomiting)    nausea   Pure hypercholesterolemia    Rosacea    Status post appendectomy 11/19/2017    Past Surgical History:  Procedure Laterality Date   APPENDECTOMY     CESAREAN SECTION     x 3, last one with triplets   CHOLECYSTECTOMY N/A 12/25/2018   Procedure: LAPAROSCOPIC CHOLECYSTECTOMY WITH INTRAOPERATIVE CHOLANGIOGRAM;  Surgeon: Jovita Kussmaul, MD;  Location: [facility];  Service: General;  Laterality: N/A;   FASCIECTOMY Right 02/15/2021   Procedure: FASCIECTOMY RIGHT MIDDLE, RING AND SMALL FINGERS;  Surgeon: Daryll Brod,  MD;  Location: [place];  Service: Orthopedics;  Laterality: Right;  AXILLARY BLOCK   LAPAROSCOPIC BILATERAL SALPINGECTOMY Right 02/16/2018   Procedure: LAPAROSCOPIC RIGHT SALPINGECTOMY;  Surgeon: Megan Salon, MD;  Location: [facility];  Service: Gynecology;  Laterality: Right;   TUBAL LIGATION      Social History   Socioeconomic History   Marital status: Married    Spouse name: Not on file   Number of children: 5   Years of education: Not on file   Highest education level: Not on file  Occupational History   Occupation: music minister    Employer: [employer]  Tobacco Use   Smoking status: Former    Packs/day: 1.00    Years: 15.00    Pack years: 15.00    Types: Cigarettes    Quit date: 03/04/2004    Years since quitting: 17.5   Smokeless tobacco: Never  Vaping Use   Vaping Use: Never used  Substance and Sexual Activity   Alcohol use: Yes    Comment: occ   Drug use: No   Sexual activity: Yes    Birth control/protection: Surgical    Comment: btl  Other Topics Concern   Not on file  Social History Narrative   Not on file   Social Determinants of Health   Financial Resource Strain: Not on file  Food Insecurity: Not on file  Transportation Needs: Not on file  Physical Activity: Not on file  Stress: Not on file  Social Connections: Not on file  Intimate Partner Violence: Not on file    Current Outpatient Medications on File Prior to Visit  Medication Sig Dispense Refill   azelastine (ASTELIN) 0.1 % nasal spray Place 1 spray into both nostrils 2 (two) times daily. Use in each nostril as directed 30 mL 0   benzonatate (TESSALON) 100 MG capsule Take 1 capsule (100 mg total) by mouth 3 (three) times daily as needed for cough. 30 capsule 0   blood glucose meter kit and supplies Dispense based on patient and insurance preference. Use in the morning when fasting and 2 hours after largest meal. (FOR ICD-10 E10.9, E11.9). 1 each 0    Continuous Blood Gluc Receiver (FREESTYLE LIBRE 2 READER) DEVI 1 each by Does not apply route daily. 1 each 0   Continuous Blood Gluc Sensor (FREESTYLE LIBRE 2 SENSOR) MISC 1 each by Does not apply route every 14 (fourteen) days. 6 each 3   doxycycline (VIBRA-TABS) 100 MG tablet Take 1 tablet (100 mg total) by mouth 2 (two) times daily. 14 tablet 0   fluticasone (FLONASE) 50 MCG/ACT nasal spray 1 spray each nostril after sinus rinse twice daily (Patient taking differently: Place 1 spray into both nostrils daily.) 16 g 2   ibuprofen (ADVIL) 200 MG tablet Take 400 mg by mouth  every 6 (six) hours as needed for moderate pain.     insulin detemir (LEVEMIR) 100 UNIT/ML FlexPen Use 10 units in am and 20 units in pm under skin (Patient taking differently: Use 20 units in am and 20 units in pm under skin) 30 mL 3   metFORMIN (GLUCOPHAGE-XR) 500 MG 24 hr tablet Take 2 tablets (1,000 mg total) by mouth 2 (two) times daily with a meal. 360 tablet 3   NOVOLOG FLEXPEN 100 UNIT/ML FlexPen Inject 7-10 Units into the skin 3 (three) times daily with meals. 15 mL 3   ONETOUCH VERIO test strip USE IN THE MORNING WHEN FASTING AND 2 HOURS AFTER LARGEST MEAL 200 each 0   traMADol (ULTRAM) 50 MG tablet Take 1 tablet (50 mg total) by mouth every 6 (six) hours as needed. 20 tablet 0   No current facility-administered medications on file prior to visit.    Allergies  Allergen Reactions   Lantus [Insulin Glargine] Other (See Comments)    headaches    Family History  Problem Relation Age of Onset   Cancer Mother        breast   Heart disease Mother    Stroke Mother    Diabetes Mother    Diabetes Father    Breast cancer Maternal Aunt    Colon cancer Neg Hx    Inflammatory bowel disease Neg Hx    Liver disease Neg Hx    Pancreatic cancer Neg Hx    Stomach cancer Neg Hx    Esophageal cancer Neg Hx     PE: BP 120/82 (BP Location: Right Arm, Patient Position: Sitting, Cuff Size: Normal)    Pulse 80    Ht _0   (1.676 m)    Wt 177 lb (80.3 kg)    SpO2 98%    BMI 28.57 kg/m  Wt Readings from Last 3 Encounters:  09/05/21 177 lb (80.3 kg)  02/15/21 197 lb 1.5 oz (89.4 kg)  10/13/20 171 lb 14.4 oz (78 kg)   Constitutional: overweight, in NAD Eyes: PERRLA, EOMI, no exophthalmos ENT: moist mucous membranes, no thyromegaly, no cervical lymphadenopathy Cardiovascular: tRRR, No MRG Respiratory: CTA B Musculoskeletal: no deformities, strength intact in all 4 Skin: moist, warm, no rashes Neurological: no tremor with outstretched hands, DTR normal in all 4  ASSESSMENT: 1. DM2, insulin-dependent, uncontrolled, with  long-term complications -Dupuytren contractions  -Adhesive capsulitis  2. HL  3. Overweight  PLAN:  1. Patient with longstanding, uncontrolled, type 2 diabetes, on oral medication with metformin ER, also basal/bolus insulin regimen adjusted at last visit, and weekly GLP-1 receptor agonist, returning after an absence of more than a year.  At last visit, her blood sugars were above target, increasing after each meal.  We increased her Levemir at night and also NovoLog doses with each meal.  We continued on metformin and Ozempic.  HbA1c at that time was very high, at 12.9%, but the HbA1c predicted by the CGM for the previous 2 weeks was 8.4%, much lower. CGM interpretation: -At today's visit, we reviewed her CGM downloads up to January, since she did not have a CGM sensor and was not checking blood sugars since: It appears that 7% of values are in target range (goal >70%), while 93% are higher than 180 (goal <25%), and 0% are lower than 70 (goal <4%).  The calculated average blood sugar is 311.  -Reviewing the CGM trends, it appears that sugars are high throughout the day and they improve overnight.  She does have gaps in the data and we discussed about switching from the freestyle libre 2 the freestyle libre 3 CGM to eliminate the need to scan the device. -At this visit we also discussed about  the fact that she needs to get more serious with her diabetes control.  She is off almost all of her medications for now.  This is not acceptable and could have catastrophic consequences for her.  We discussed about restarting her metformin and also her basal/bolus insulin regimen.  We will start at lower doses and increase as needed.  As of now, she mentions that she came off Ozempic due to GI intolerance (nausea and diarrhea).  We will not restart this. -I did advise her that if she is not coming for visits and not following instructions, will not be able to help her.  She agrees to start taking better care of her diabetes. - I suggested to:  Patient Instructions  Please restart: - Metformin ER 1000 mg 2x a day, with meals - Levemir 20 units in am and 20 units at night >> increase towards 30 units 2x a day over the next 2 weeks - Novolog 8-10 units 15 min before meals >> increase towards 14-16 units over the next 2 weeks   Please return in 1.5 month.   - we checked her HbA1c: 12.9% (stable, abysmal) - advised to check sugars at different times of the day - 4x a day, rotating check times - advised for yearly eye exams >> she is not UTD - she is due for annual labs-we will check these at next visit, when her diabetes improves more - return to clinic in 1.5 month  2. HL -Reviewed latest lipid panel from 2021: All fractions at goal: Lab Results  Component Value Date   CHOL 131 02/01/2020   HDL 46 02/01/2020   LDLCALC 65 02/01/2020   LDLDIRECT 104.2 03/09/2014   TRIG 107 02/01/2020   CHOLHDL 2.8 02/01/2020  -She was on Crestor 5 mg daily without side effects -came off... I sent a new prescription for her and strongly advised her to start it -She is due for another lipid panel -we will check this at next visit  3.  Overweight -She cannot continue the GLP-1 receptor agonist due to previous GI intolerance.  We will restart metformin which has an appetite suppressant effect long-term. -She  gained 4 pounds before last visit -At last visit, her weight was 179 pounds.  She gained 18 pounds since then, but lost 20 pounds in the last 7 months.  Philemon Kingdom, MD PhD [facility] Endocrinology

## 2021-09-05 NOTE — Patient Instructions (Addendum)
Please restart: ?- Metformin ER 1000 mg 2x a day, with meals ?- Levemir 20 units in am and 20 units at night >> increase towards 30 units 2x a day over the next 2 weeks ?- Novolog 8-10 units 15 min before meals >> increase towards 14-16 units over the next 2 weeks ?  ?Please return in 1.5 month. ?

## 2021-10-18 ENCOUNTER — Ambulatory Visit: Payer: BC Managed Care – PPO | Admitting: Internal Medicine

## 2021-10-27 IMAGING — DX DG CHEST 2V
2 series · 2 of 2 positions shown · non-contrast
Comparison: None.

CLINICAL DATA: Shortness of breath and chest pain

EXAM:
CHEST - 2 VIEW

[chest pa]
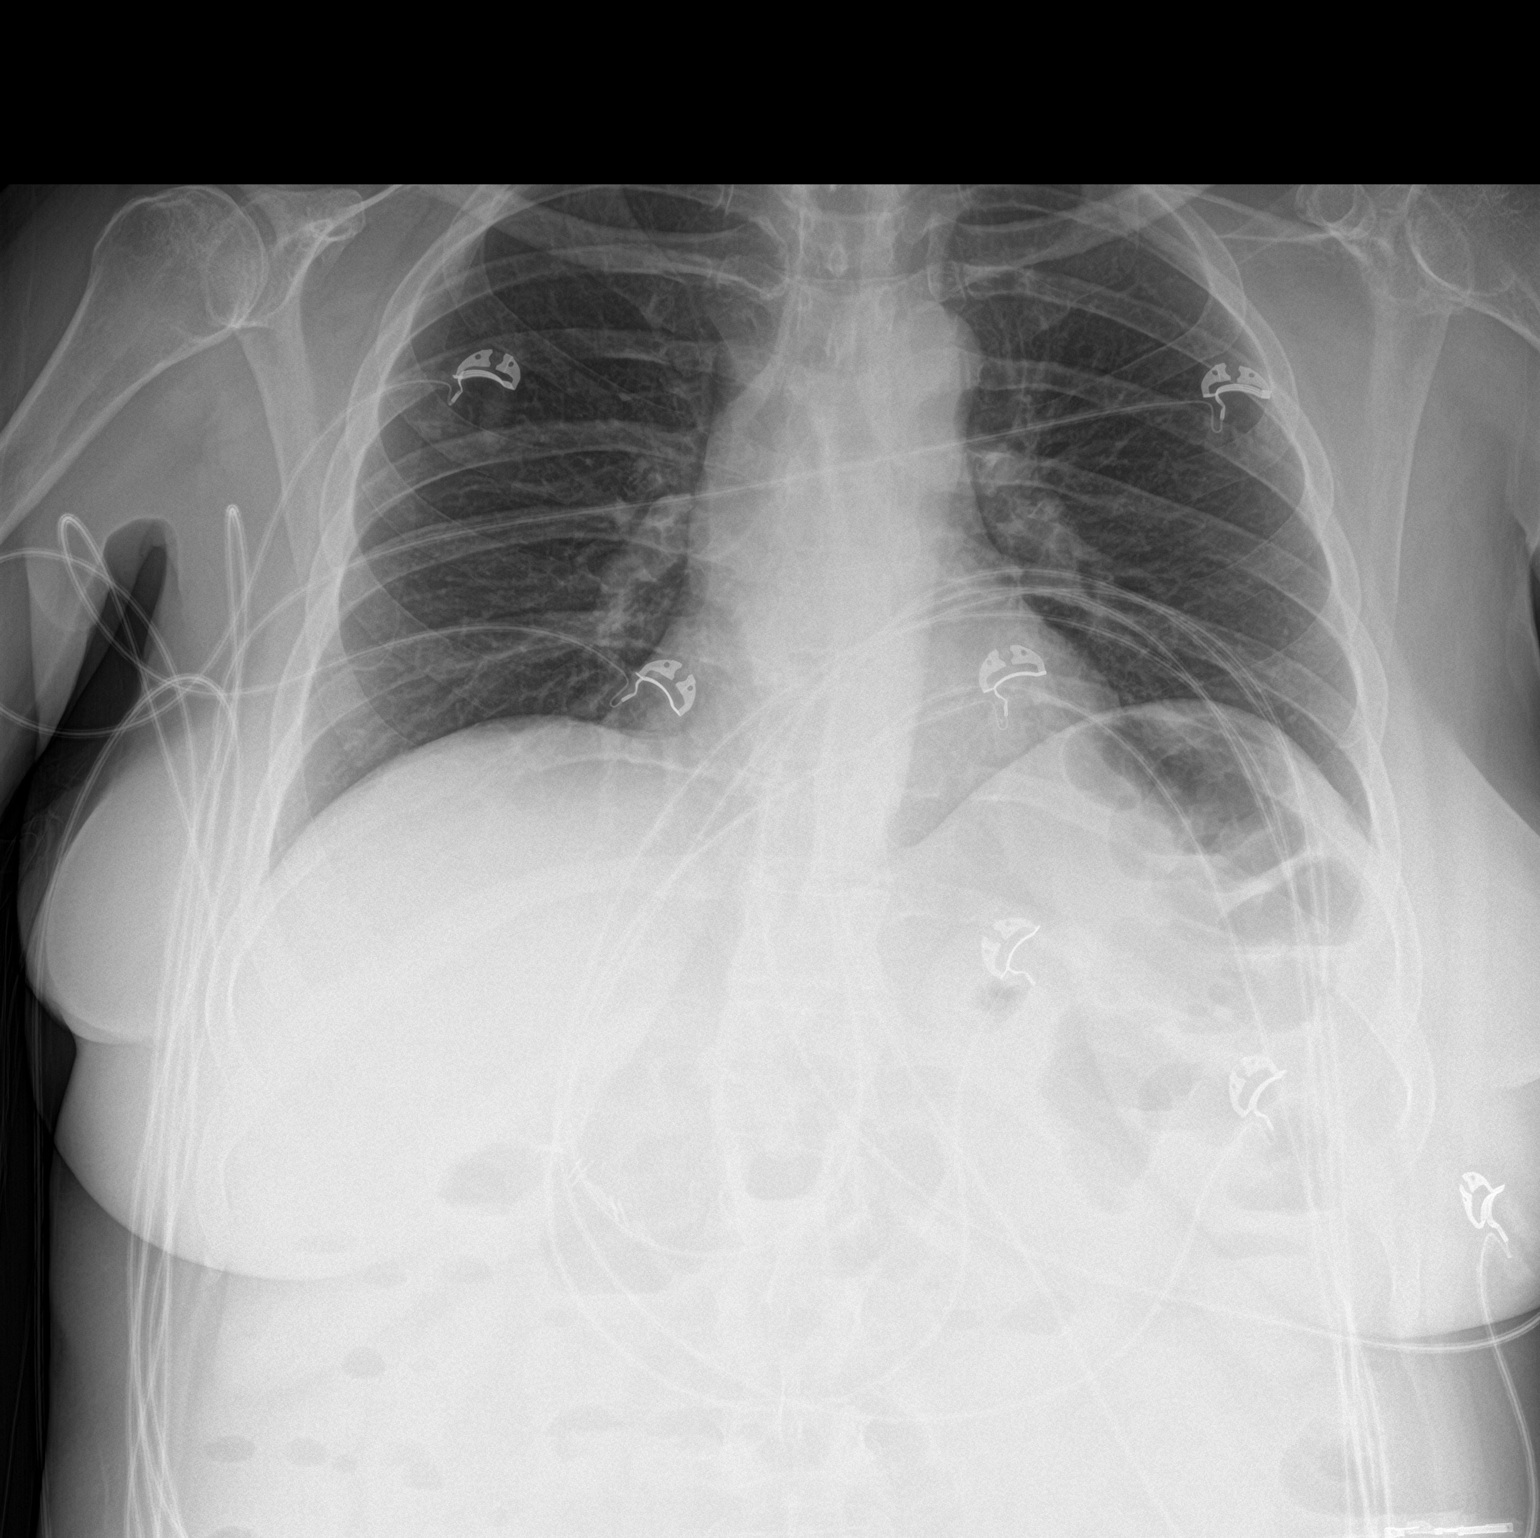

[chest lat]
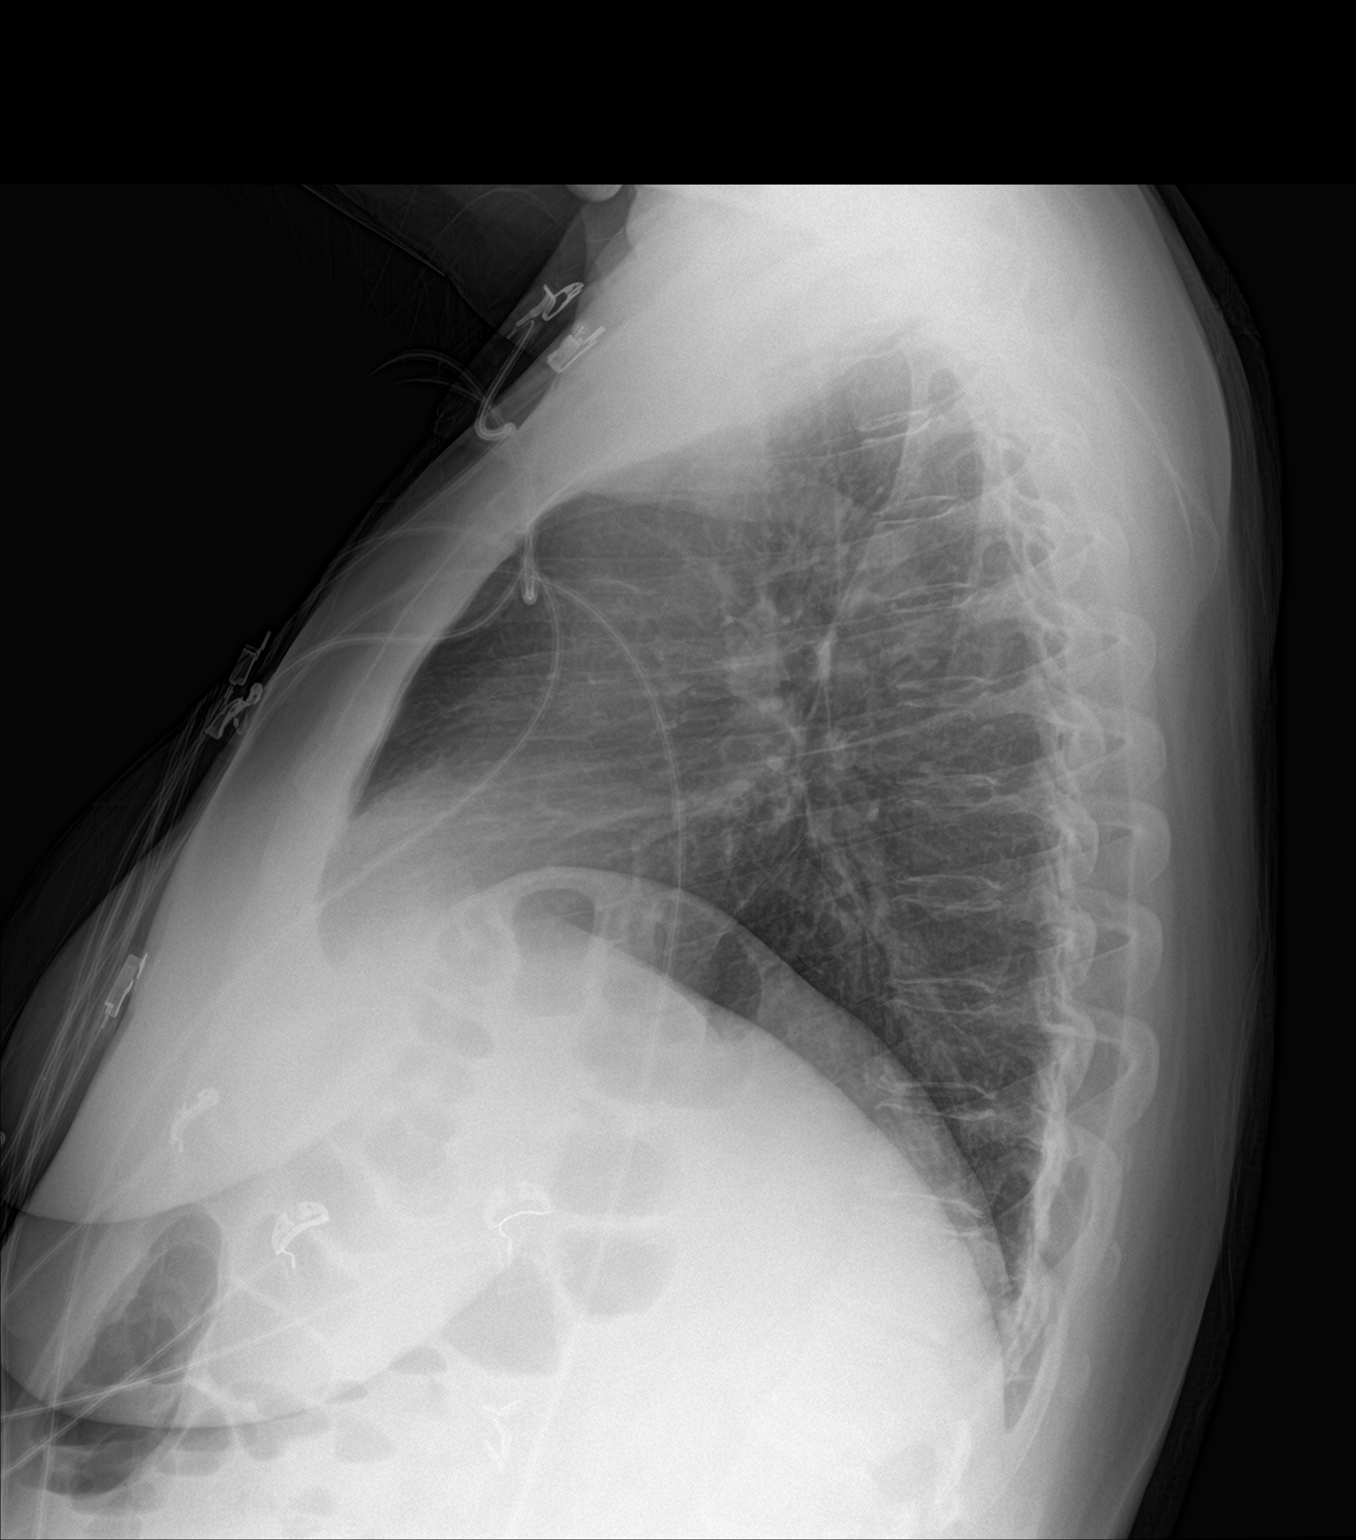

[2 of 2 positions shown; findings below may reference images not displayed]

FINDINGS: The heart size and mediastinal contours are within normal limits.
Both lungs are clear. The visualized skeletal structures are
unremarkable.
IMPRESSION: No active cardiopulmonary disease.

## 2021-10-27 IMAGING — CT CT ANGIO CHEST
3 of 9 series · 18 of 36 positions shown · IV contrast (Omnipaque)
Comparison: None.

CLINICAL DATA: Fever

EXAM:
CT ANGIOGRAPHY CHEST WITH CONTRAST
TECHNIQUE: Multidetector CT imaging of the chest was performed using the
standard protocol during bolus administration of intravenous
contrast. Multiplanar CT image reconstructions and MIPs were
obtained to evaluate the vascular anatomy.
CONTRAST:  100mL OMNIPAQUE IOHEXOL 350 MG/ML SOLN

[Series 5: pe thins · axial · 0.79mm/px · z∈[-58,+190]mm · 14 of 288 slices shown]
[im 20/288  lung]
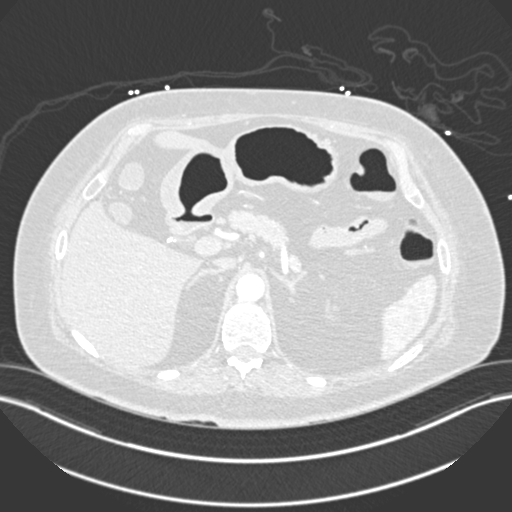
[im 39/288  mediastinal]
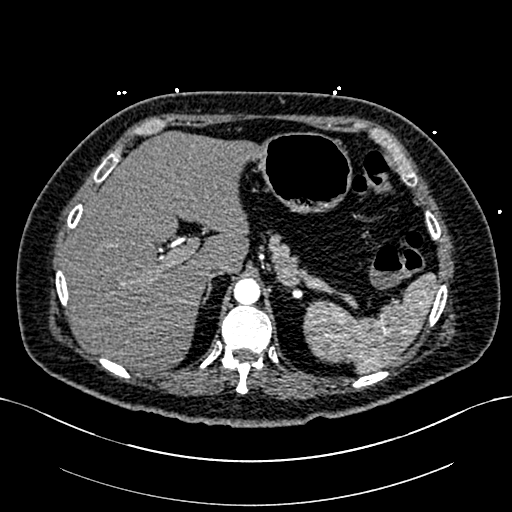
[im 58/288  lung]
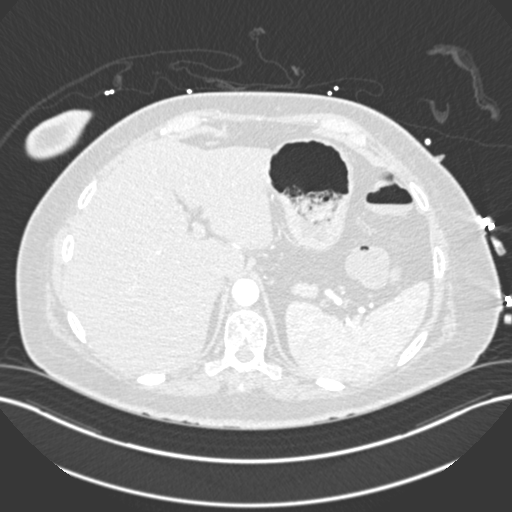
[im 77/288  mediastinal]
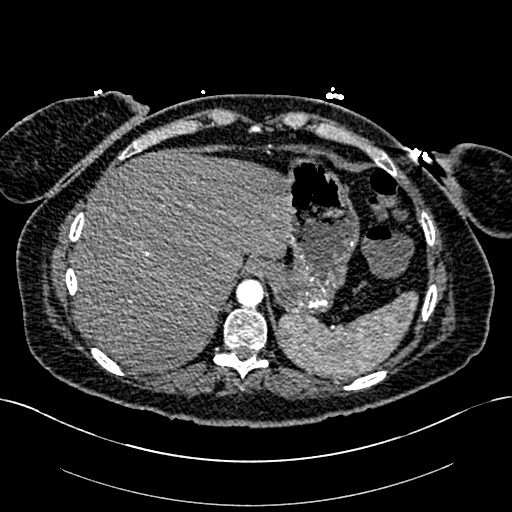
[im 96/288  lung]
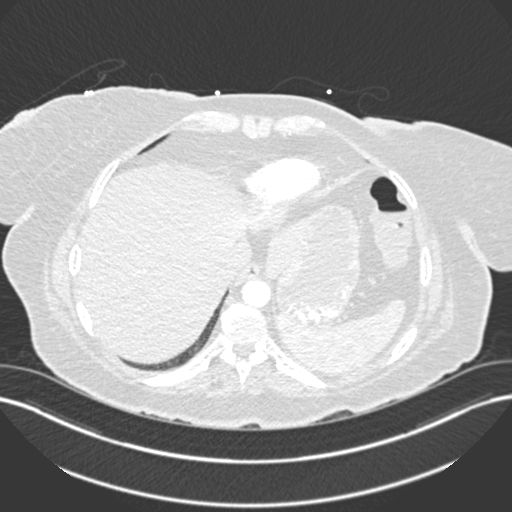
[im 115/288  mediastinal]
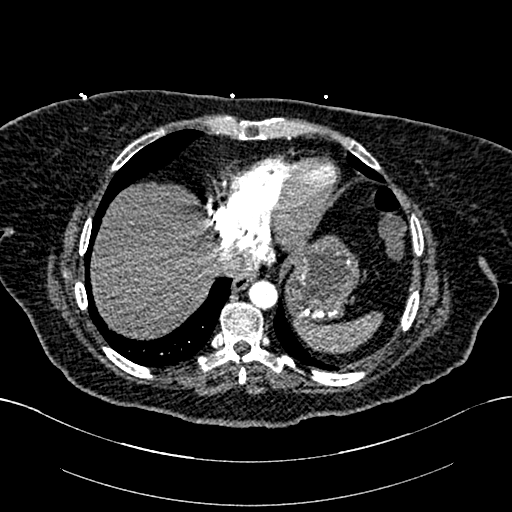
[im 134/288  lung]
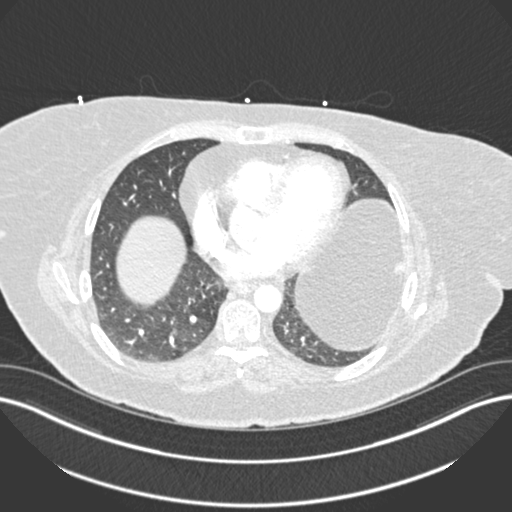
[im 154/288  mediastinal]
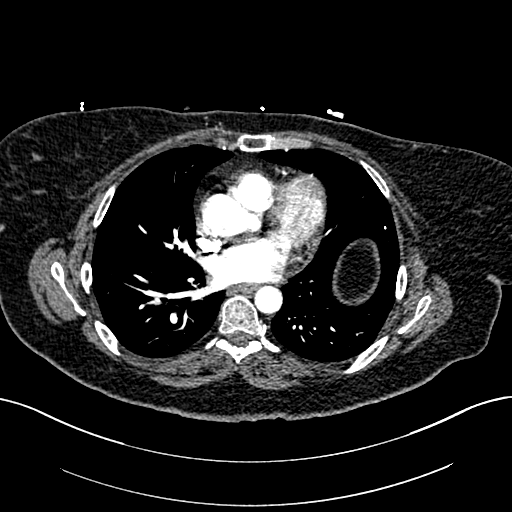
[im 173/288  lung]
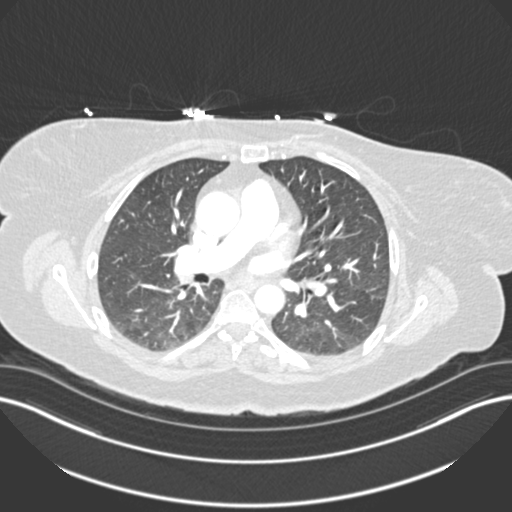
[im 192/288  mediastinal]
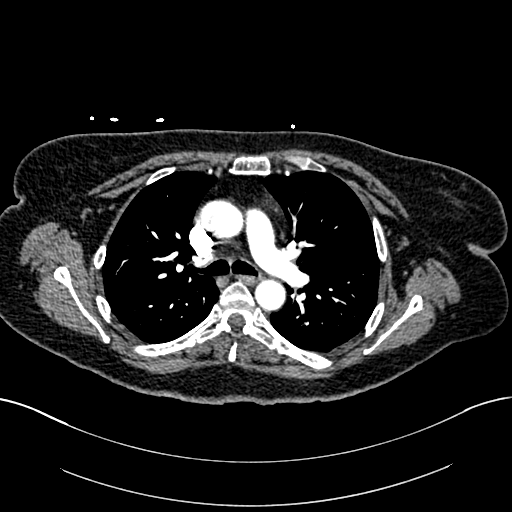
[im 211/288  lung]
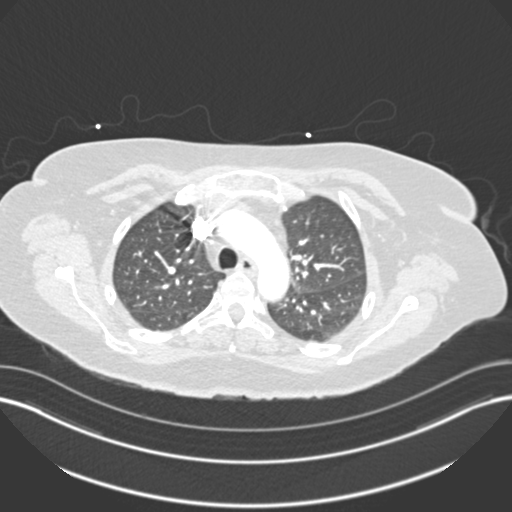
[im 230/288  mediastinal]
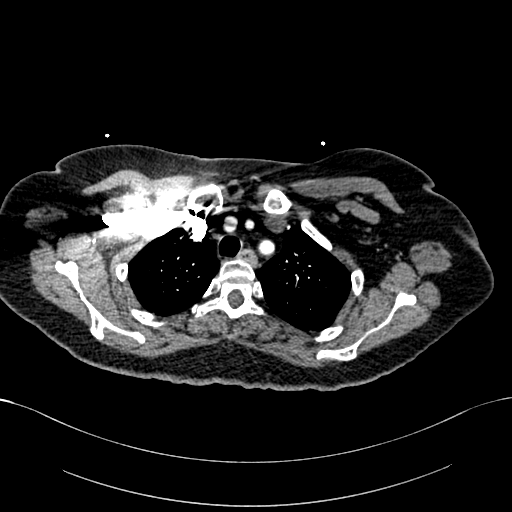
[im 249/288  lung]
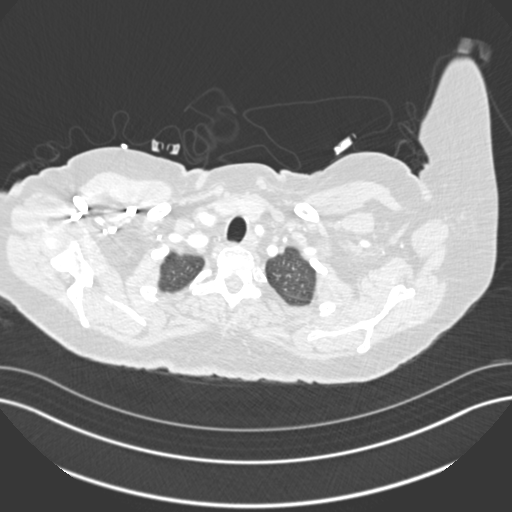
[im 268/288  mediastinal]
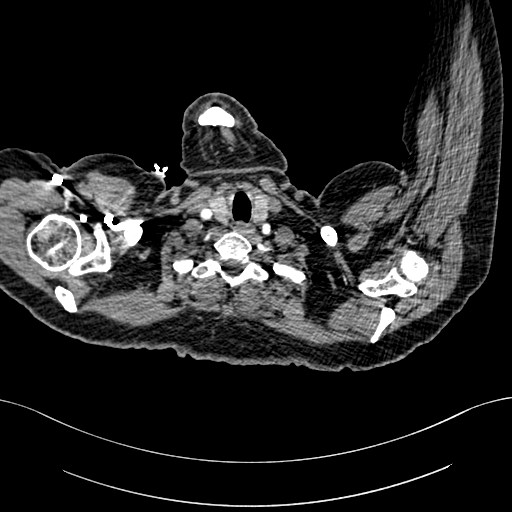

[Series 6: pe lung · axial · 0.94mm/px · z∈[+26,+146]mm · 3 of 82 slices shown]
[im 21/82  mediastinal]
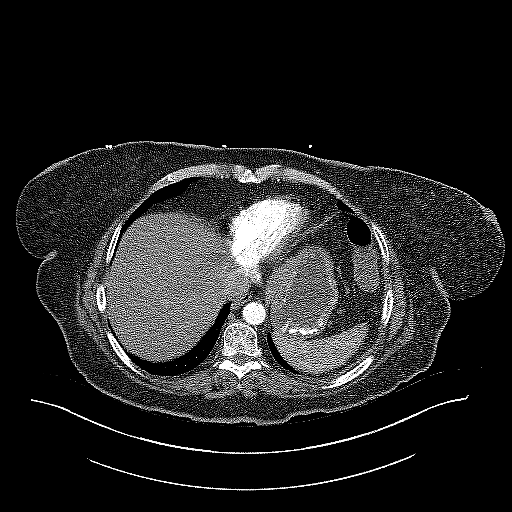
[im 41/82  mediastinal]
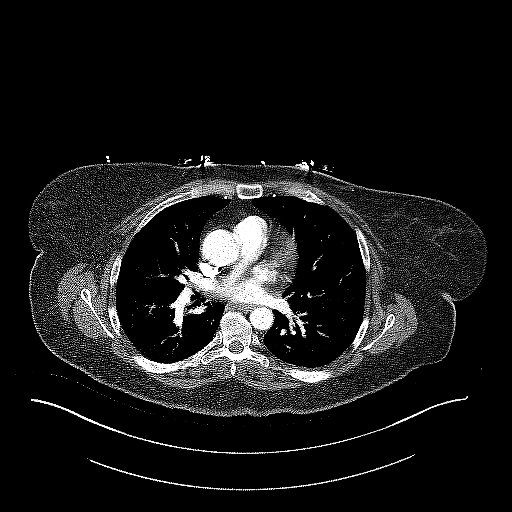
[im 61/82  mediastinal]
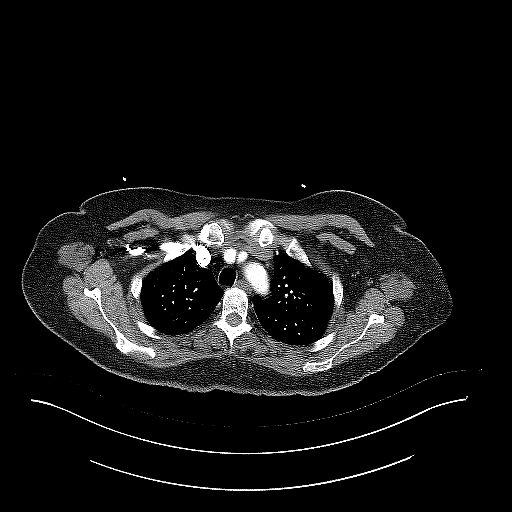

[Series 7: pe coronal mpr · coronal · 0.59mm/px · 1 of 151 slices shown]
[im 76/151  mediastinal]
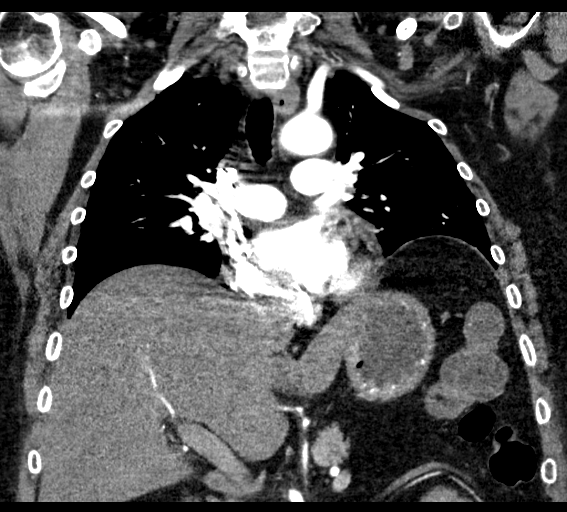

[18 of 36 positions shown; findings below may reference images not displayed]

FINDINGS: Cardiovascular: No filling defects in the pulmonary arteries to
suggest pulmonary emboli. Heart is normal size. Aorta is normal
caliber.

Mediastinum/Nodes: No mediastinal, hilar, or axillary adenopathy.
Trachea and esophagus are unremarkable. Thyroid unremarkable.

Lungs/Pleura: Lungs are clear. No focal airspace opacities or
suspicious nodules. No effusions.

Upper Abdomen: Imaging into the upper abdomen demonstrates no acute
findings.

Musculoskeletal: Chest wall soft tissues are unremarkable. No acute
bony abnormality.

Review of the MIP images confirms the above findings.
IMPRESSION: No evidence of pulmonary embolus.

No acute cardiopulmonary disease.

## 2021-11-05 ENCOUNTER — Ambulatory Visit: Payer: BC Managed Care – PPO | Admitting: Internal Medicine

## 2021-11-19 ENCOUNTER — Ambulatory Visit (INDEPENDENT_AMBULATORY_CARE_PROVIDER_SITE_OTHER): Payer: BC Managed Care – PPO | Admitting: Internal Medicine

## 2021-11-19 ENCOUNTER — Encounter: Payer: Self-pay | Admitting: Internal Medicine

## 2021-11-19 VITALS — BP 110/76 | HR 82 | Ht 66.0 in | Wt 176.4 lb

## 2021-11-19 DIAGNOSIS — E663 Overweight: Secondary | ICD-10-CM

## 2021-11-19 DIAGNOSIS — E1142 Type 2 diabetes mellitus with diabetic polyneuropathy: Secondary | ICD-10-CM | POA: Diagnosis not present

## 2021-11-19 DIAGNOSIS — E1165 Type 2 diabetes mellitus with hyperglycemia: Secondary | ICD-10-CM

## 2021-11-19 DIAGNOSIS — E1169 Type 2 diabetes mellitus with other specified complication: Secondary | ICD-10-CM

## 2021-11-19 DIAGNOSIS — E785 Hyperlipidemia, unspecified: Secondary | ICD-10-CM

## 2021-11-19 LAB — POCT GLYCOSYLATED HEMOGLOBIN (HGB A1C): Hemoglobin A1C: 12.1 % — AB (ref 4.0–5.6)

## 2021-11-19 MED ORDER — CEQUR SIMPLICITY 2U DEVI: 1.0000 | Freq: Once | 11 refills | Status: AC

## 2021-11-19 MED ORDER — CEQUR SIMPLICITY INSERTER MISC: 0 refills | Status: DC

## 2021-11-19 NOTE — Patient Instructions (Addendum)
Please continue: - Metformin ER 1000 mg 2x a day, with meals  Increase: - Levemir 40 units 2x a day  Try to start the Cequr Simplicity pump; - 02-89 clicks per meal  If you cannot obtain this, increase - Novolog 20-24 units before meals   Please return in 2-3 months.

## 2021-11-19 NOTE — Progress Notes (Signed)
Patient ID: Lindsey Mason, female   DOB: 12-08-1970, 51 y.o.   MRN: [id number]   This visit occurred during the SARS-CoV-2 public health emergency.  Safety protocols were in place, including screening questions prior to the visit, additional usage of staff PPE, and extensive cleaning of exam room while observing appropriate contact time as indicated for disinfecting solutions.   HPI: Lindsey Mason is a 51 y.o.-year-old female, initially referred by her PCP, Abonza, Maritza, PA-C, returning for follow-up for DM2, prev. GDM with triplets in 1997, then dx'ed with DM right after the pregnancy, dx in 2005, insulin-dependent, uncontrolled, with complications (Dupuytren contractions, adhesive capsulitis).  Last visit 2 months ago.  Interim history: No increased urination, blurry vision, nausea, chest pain. 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  Reviewed HbA1c levels: Lab Results  Component Value Date   HGBA1C 12.9 (A) 09/05/2021   HGBA1C 12.9 (A) 07/03/2020   HGBA1C 11.1 (H) 02/01/2020   HGBA1C 8.8 (H) 06/28/2019   HGBA1C 6.2 (A) 05/04/2019   HGBA1C 8.4 (A) 02/04/2019   HGBA1C 11.3 (H) 12/25/2018   HGBA1C 5.9 01/27/2018   HGBA1C 9.6 11/04/2017   HGBA1C >14 08/04/2017   At last visit she was on: - Metformin ER 2000 mg with dinner >> 1000 mg 2x a day >> takes it occasionally - Ozempic 1 mg weekly  -increase 01/2020 >> stopped 2/2 nausea, diarrhea - Levemir 10 >> 20 units in am and 20 at night -restarted 07/2020 >> 20 units in a.m. and 30 units at night >> every once in a while, 30 units 2x a day - Novolog 7-10 >> 7-10-7 units 15 min before meals -restarted 07/2020 >> 10-16 units before each meal >> not using   I advised her to restart: - Metformin ER 1000 mg 2x a day, with meals - Levemir 30 units 2x  a day - Novolog 14-16 units before each meal - 12 units once a day at night  Pt.checks her sugars more than 4 times a day with her CGM:  Previously:  Previously:   Lowest sugar was 60 >> 287 >> 80 x1 >> 150 >> 200; she has hypoglycemia awareness at 80.  Highest sugar was 500 >> 350 >> 400s >> HI  Glucometer: One Touch verio.  Pt's meals are -  - Breakfast: Protein bar - Protein shake - Lunch: soup, mashed potato, penne pasta - Dinner: lean meat + green veggies - Snacks: no snacks after dinner  -No CKD, last BUN/creatinine:  Lab Results  Component Value Date   BUN 13 02/12/2021   BUN 15 10/13/2020   CREATININE 0.50 02/12/2021   CREATININE 0.61 10/13/2020  Not on ACE inhibitor/ARB.  -+ HL; last set of lipids: Lab Results  Component Value Date   CHOL 131 02/01/2020   HDL 46 02/01/2020   LDLCALC 65 02/01/2020   LDLDIRECT 104.2 03/09/2014   TRIG 107 02/01/2020   CHOLHDL 2.8 02/01/2020  On Crestor 5 mg daily.  - last eye exam was in 10/2021: no DR reportedly  -+ Numbness and tingling in her right foot.  She has Dupuytren contractions and adhesive capsulitis. She also has a history of PCOS.  Pt has FH of DM in mother and father. No FH of MTC or personal pancreatitis hx.  ROS: + see  HPI Neurological: no tremors/+ numbness/+ tingling/no dizziness  I reviewed pt's medications, allergies, PMH, social hx, family hx, and changes were documented in the history of present illness. Otherwise, unchanged from my initial visit note.   Past Medical History:  Diagnosis Date   Clomid pregnancy 1997, 2005   Depression    Diabetes mellitus without mention of complication    Esophageal reflux    Headache(784.0)    Infertility, female    PCOS - Clomid pregnancies    Ovarian cyst 2019   PCOS (polycystic ovarian syndrome) 05/27/2012   PONV (postoperative nausea and vomiting)    nausea   Pure hypercholesterolemia    Rosacea    Status post appendectomy 11/19/2017    Past  Surgical History:  Procedure Laterality Date   APPENDECTOMY     CESAREAN SECTION     x 3, last one with triplets   CHOLECYSTECTOMY N/A 12/25/2018   Procedure: LAPAROSCOPIC CHOLECYSTECTOMY WITH INTRAOPERATIVE CHOLANGIOGRAM;  Surgeon: Jovita Kussmaul, MD;  Location: [facility];  Service: General;  Laterality: N/A;   FASCIECTOMY Right 02/15/2021   Procedure: FASCIECTOMY RIGHT MIDDLE, RING AND SMALL FINGERS;  Surgeon: Daryll Brod, MD;  Location: [facility];  Service: Orthopedics;  Laterality: Right;  AXILLARY BLOCK   LAPAROSCOPIC BILATERAL SALPINGECTOMY Right 02/16/2018   Procedure: LAPAROSCOPIC RIGHT SALPINGECTOMY;  Surgeon: Megan Salon, MD;  Location: [facility];  Service: Gynecology;  Laterality: Right;   TUBAL LIGATION      Social History   Socioeconomic History   Marital status: Married    Spouse name: Not on file   Number of children: 5   Years of education: Not on file   Highest education level: Not on file  Occupational History   Occupation: music minister    Employer: [employer]  Tobacco Use   Smoking status: Former    Packs/day: 1.00    Years: 15.00    Pack years: 15.00    Types: Cigarettes    Quit date: 03/04/2004    Years since quitting: 17.7   Smokeless tobacco: Never  Vaping Use   Vaping Use: Never used  Substance and Sexual Activity   Alcohol use: Yes    Comment: occ   Drug use: No   Sexual activity: Yes    Birth control/protection: Surgical    Comment: btl  Other Topics Concern   Not on file  Social History Narrative   Not on file   Social Determinants of Health   Financial Resource Strain: Not on file  Food Insecurity: Not on file  Transportation Needs: Not on file  Physical Activity: Not on file  Stress: Not on file  Social Connections: Not on file  Intimate Partner Violence: Not on file    Current Outpatient Medications on File Prior to Visit  Medication Sig Dispense Refill   azelastine (ASTELIN) 0.1 % nasal spray  Place 1 spray into both nostrils 2 (two) times daily. Use in each nostril as directed 30 mL 0   benzonatate (TESSALON) 100 MG capsule Take 1 capsule (100 mg total) by mouth 3 (three) times daily as needed for cough. 30 capsule 0   blood glucose meter kit and supplies Dispense based on patient and insurance preference. Use in the morning when fasting and 2 hours after largest meal. (FOR ICD-10 E10.9, E11.9). 1 each 0   Continuous Blood Gluc Receiver (FREESTYLE LIBRE 2 READER) DEVI 1 each by Does not apply route daily. 1 each 0   Continuous Blood  Gluc Sensor (FREESTYLE LIBRE 3 SENSOR) MISC 1 each by Does not apply route every 14 (fourteen) days. 6 each 3   doxycycline (VIBRA-TABS) 100 MG tablet Take 1 tablet (100 mg total) by mouth 2 (two) times daily. 14 tablet 0   fluticasone (FLONASE) 50 MCG/ACT nasal spray 1 spray each nostril after sinus rinse twice daily (Patient taking differently: Place 1 spray into both nostrils daily.) 16 g 2   ibuprofen (ADVIL) 200 MG tablet Take 400 mg by mouth every 6 (six) hours as needed for moderate pain.     insulin detemir (LEVEMIR) 100 UNIT/ML FlexPen Use 30 units in am and 30 units in pm under skin 45 mL 3   Insulin Pen Needle 32G X 4 MM MISC Use 5x a day 400 each 3   metFORMIN (GLUCOPHAGE-XR) 500 MG 24 hr tablet Take 2 tablets (1,000 mg total) by mouth 2 (two) times daily with a meal. 360 tablet 3   NOVOLOG FLEXPEN 100 UNIT/ML FlexPen Inject 8-16 Units into the skin 3 (three) times daily with meals. 45 mL 3   ONETOUCH VERIO test strip USE IN THE MORNING WHEN FASTING AND 2 HOURS AFTER LARGEST MEAL 200 each 0   rosuvastatin (CRESTOR) 5 MG tablet Take 1 tablet (5 mg total) by mouth daily. 90 tablet 3   traMADol (ULTRAM) 50 MG tablet Take 1 tablet (50 mg total) by mouth every 6 (six) hours as needed. 20 tablet 0   No current facility-administered medications on file prior to visit.    Allergies  Allergen Reactions   Lantus [Insulin Glargine] Other (See Comments)     headaches    Family History  Problem Relation Age of Onset   Cancer Mother        breast   Heart disease Mother    Stroke Mother    Diabetes Mother    Diabetes Father    Breast cancer Maternal Aunt    Colon cancer Neg Hx    Inflammatory bowel disease Neg Hx    Liver disease Neg Hx    Pancreatic cancer Neg Hx    Stomach cancer Neg Hx    Esophageal cancer Neg Hx     PE: BP 110/76 (BP Location: Left Arm, Patient Position: Sitting, Cuff Size: Normal)   Pulse 82   Ht '5\' 6"'  (1.676 m)   Wt 176 lb 6.4 oz (80 kg)   SpO2 99%   BMI 28.47 kg/m  Wt Readings from Last 3 Encounters:  11/19/21 176 lb 6.4 oz (80 kg)  09/05/21 177 lb (80.3 kg)  02/15/21 197 lb 1.5 oz (89.4 kg)   Constitutional: overweight, in NAD Eyes: PERRLA, EOMI, no exophthalmos ENT: moist mucous membranes, no thyromegaly, no cervical lymphadenopathy Cardiovascular: RRR, No MRG Respiratory: CTA B Musculoskeletal: no deformities, strength intact in all 4 Skin: moist, warm, no rashes Neurological: no tremor with outstretched hands, DTR normal in all 4 Diabetic Foot Exam - Simple   Simple Foot Form Diabetic Foot exam was performed with the following findings: Yes 11/19/2021  9:00 AM  Visual Inspection No deformities, no ulcerations, no other skin breakdown bilaterally: Yes Sensation Testing Intact to touch and monofilament testing bilaterally: Yes Pulse Check Posterior Tibialis and Dorsalis pulse intact bilaterally: Yes Comments    ASSESSMENT: 1. DM2, insulin-dependent, uncontrolled, with  long-term complications -Dupuytren contractions  -Adhesive capsulitis  2. HL  3. Overweight  PLAN:  1. Patient with longstanding, uncontrolled, type 2 diabetes, poorly tolerant at last visit after long absence and  off almost all of her medications.  At that time, HbA1c was extremely high, at 12.9% and we discussed that this could have catastrophic consequences for her.  We restarted her metformin and also her  basal-bolus insulin regimen.  We did not restart Ozempic since she had to stop due to nausea and diarrhea.  We did discuss that if she is not able to come for the visit and take her medications as recommended, I would not be able to help her.  She agreed to start taking better care of her diabetes. CGM interpretation: -At today's visit, we reviewed her CGM downloads: It appears that 0% of values are in target range (goal >70%), while 100% are higher than 180 (goal <25%), and 0% are lower than 70 (goal <4%).  The calculated average blood sugar is 343.  The projected HbA1c for the next 3 months (GMI) is 11.5%. -Reviewing the CGM trends, it appears that sugars are extremely high, barely visible on the ambulatory glucose profile window from the CGM downloads.  100% of the blood sugars are above target.  Upon questioning, she is taking the Levemir but did not increase the dose despite the elevated blood sugars.  Moreover, she is taking a low-dose of NovoLog and only once a day, before dinner.  We discussed that this is not enough and at this visit I advised her to increase the Levemir by 20 units daily and also discussed about the importance of taking NovoLog consistently before each meal.  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  I showed her the pump and explained how it works.  She agrees with this.  I sent a prescription for this to her pharmacy and advised her to let me know if this is covered so I can send NovoLog vials to her pharmacy and refer her to diabetes education.  For now, we will increase the NovoLog dose and definitely advised her to take it before every meal.  I advised her to continue to increase the insulin doses until her sugars start decreasing and reach target range. - I suggested to:  Patient Instructions  Please continue: - Metformin ER 1000 mg 2x a  day, with meals  Increase: - Levemir 40 units 2x a day  Try to start the Cequr Simplicity pump; - 98-33 clicks per meal  If you cannot obtain this, increase - Novolog 20-24 units before meals   Please return in 2-3 months.   - we checked her HbA1c: 12.1% (still very high, slightly lower) - advised to check sugars at different times of the day - 4x a day, rotating check times - advised for yearly eye exams >> she is UTD - foot exam performed today - return to clinic in 2-3 months  2. HL -Reviewed latest lipid panel from 2021: All fractions at goal: Lab Results  Component Value Date   CHOL 131 02/01/2020   HDL 46 02/01/2020   LDLCALC 65 02/01/2020   LDLDIRECT 104.2 03/09/2014   TRIG 107 02/01/2020   CHOLHDL 2.8 02/01/2020  -She was on Crestor 5 mg daily without side effects -came off... At last visit I sent a prescription for this and strongly advised her to start it -She is due for another lipid panel -we will check at next visit, when diabetes becomes more controlled  3.  Overweight -She  cannot continue the GLP-1 receptor agonist due to previous GI intolerance.  At last visit we restarted metformin which has an appetite suppressant effect long-term -She lost 20 pounds before last visit, most likely due to glucotoxicity -She lost 1 pound since last visit  Philemon Kingdom, MD PhD [facility] Endocrinology

## 2021-12-13 ENCOUNTER — Telehealth: Payer: Self-pay | Admitting: Pharmacy Technician

## 2021-12-13 NOTE — Telephone Encounter (Signed)
Patient Advocate Encounter  Received notification from [place] [facility] that the request for prior authorization for CeQur has been denied due to Not being covered by prescription benefit.

## 2022-01-02 NOTE — Telephone Encounter (Signed)
Vincente Liberty, Would you be able to send the PA to be covered under her medical benefit?  Ty! CG

## 2022-01-03 NOTE — Telephone Encounter (Signed)
Thank you, Vincente Liberty!  Tileshia, could we send this to a supplier through [place] or we need to see which ones are working with her insurance first?  Ty! C

## 2022-01-11 NOTE — Telephone Encounter (Signed)
Lvm for pt requesting she contact her insurance to verify which DME supplier they would prefer she use. MyChart message sent as well.

## 2022-01-11 NOTE — Telephone Encounter (Signed)
T, Let's have her call some of the suppliers supported by her insurance and see where she can get it from.

## 2022-01-15 ENCOUNTER — Other Ambulatory Visit: Payer: Self-pay | Admitting: Internal Medicine

## 2022-01-15 DIAGNOSIS — E1165 Type 2 diabetes mellitus with hyperglycemia: Secondary | ICD-10-CM

## 2022-01-29 ENCOUNTER — Telehealth: Payer: Self-pay | Admitting: Nutrition

## 2022-01-29 NOTE — Telephone Encounter (Signed)
LVM that I was calling to schedule pump training.  Gave number to call me back to schedule this.

## 2022-02-28 ENCOUNTER — Ambulatory Visit: Payer: BC Managed Care – PPO | Admitting: Internal Medicine

## 2022-02-28 NOTE — Progress Notes (Deleted)
Patient ID: Lindsey Mason, female   DOB: 1971/05/17, 51 y.o.   MRN: [id number]   HPI: Lindsey Mason is a 51 y.o.-year-old female, initially referred by her PCP, Lorrene Reid, PA-C, returning for follow-up for DM2, prev. GDM with triplets in 1997, then dx'ed with DM right after the pregnancy, dx in 2005, insulin-dependent, uncontrolled, with complications (Dupuytren contractions, adhesive capsulitis).  Last visit 3 months ago.  Interim history: No increased urination, blurry vision, nausea, chest pain.  Reviewed HbA1c levels: Lab Results  Component Value Date   HGBA1C 12.1 (A) 11/19/2021   HGBA1C 12.9 (A) 09/05/2021   HGBA1C 12.9 (A) 07/03/2020   HGBA1C 11.1 (H) 02/01/2020   HGBA1C 8.8 (H) 06/28/2019   HGBA1C 6.2 (A) 05/04/2019   HGBA1C 8.4 (A) 02/04/2019   HGBA1C 11.3 (H) 12/25/2018   HGBA1C 5.9 01/27/2018   HGBA1C 9.6 11/04/2017   Previously on: - Metformin ER 2000 mg with dinner >> 1000 mg 2x a day >> takes it occasionally - Ozempic 1 mg weekly  -increase 01/2020 >> stopped 2/2 nausea, diarrhea - Levemir 10 >> 20 units in am and 20 at night -restarted 07/2020 >> 20 units in a.m. and 30 units at night >> every once in a while, 30 units 2x a day - Novolog 7-10 >> 7-10-7 units 15 min before meals -restarted 07/2020 >> 10-16 units before each meal >> not using   At last visit I advised her to use the following regimen: - Metformin ER 1000 mg 2x a day, with meals - Levemir 30 units 2x a day >> 40 units 2x a day - Novolog 14-16 units before each meal - 12 units once a day at night >> 20-24 units before meals  Pt.checks her sugars more than 4 times a day with her CGM:  Previously:   Previously:   Lowest sugar was 60 ...>> 150 >> 200; she has hypoglycemia awareness at 80.  Highest sugar was 500 >> 350 >> 400s >> HI  Glucometer: One Touch verio.  Pt's meals are -  - Breakfast: Protein bar - Protein shake - Lunch: soup, mashed potato, penne pasta - Dinner: lean meat + green  veggies - Snacks: no snacks after dinner  -No CKD, last BUN/creatinine:  Lab Results  Component Value Date   BUN 13 02/12/2021   BUN 15 10/13/2020   CREATININE 0.50 02/12/2021   CREATININE 0.61 10/13/2020  Not on ACE inhibitor/ARB.  -+ HL; last set of lipids: Lab Results  Component Value Date   CHOL 131 02/01/2020   HDL 46 02/01/2020   LDLCALC 65 02/01/2020   LDLDIRECT 104.2 03/09/2014   TRIG 107 02/01/2020   CHOLHDL 2.8 02/01/2020  On Crestor 5 mg daily.  - last eye exam was in 10/2021: no DR reportedly  -+ Numbness and tingling in her right foot.  Last foot exam 10/2021.  She has Dupuytren contractions and adhesive capsulitis. She also has a history of PCOS.  Pt has FH of DM in mother and father. No FH of MTC or personal pancreatitis hx.  ROS: + see HPI Neurological: no tremors/+ numbness/+ tingling/no dizziness  I reviewed pt's medications, allergies, PMH, social hx, family hx, and changes were documented in the history of present illness. Otherwise, unchanged from my initial visit note.   Past Medical History:  Diagnosis Date   Clomid pregnancy 1997, 2005   Depression    Diabetes mellitus without mention of complication    Esophageal reflux    Headache(784.0)  Infertility, female    PCOS - Clomid pregnancies    Ovarian cyst 2019   PCOS (polycystic ovarian syndrome) 05/27/2012   PONV (postoperative nausea and vomiting)    nausea   Pure hypercholesterolemia    Rosacea    Status post appendectomy 11/19/2017    Past Surgical History:  Procedure Laterality Date   APPENDECTOMY     CESAREAN SECTION     x 3, last one with triplets   CHOLECYSTECTOMY N/A 12/25/2018   Procedure: LAPAROSCOPIC CHOLECYSTECTOMY WITH INTRAOPERATIVE CHOLANGIOGRAM;  Surgeon: Jovita Kussmaul, MD;  Location: [facility];  Service: General;  Laterality: N/A;   FASCIECTOMY Right 02/15/2021   Procedure: FASCIECTOMY RIGHT MIDDLE, RING AND SMALL FINGERS;  Surgeon: Daryll Brod, MD;  Location:  [facility];  Service: Orthopedics;  Laterality: Right;  AXILLARY BLOCK   LAPAROSCOPIC BILATERAL SALPINGECTOMY Right 02/16/2018   Procedure: LAPAROSCOPIC RIGHT SALPINGECTOMY;  Surgeon: Megan Salon, MD;  Location: [facility];  Service: Gynecology;  Laterality: Right;   TUBAL LIGATION      Social History   Socioeconomic History   Marital status: Married    Spouse name: Not on file   Number of children: 5   Years of education: Not on file   Highest education level: Not on file  Occupational History   Occupation: music minister    Employer: [employer]  Tobacco Use   Smoking status: Former    Packs/day: 1.00    Years: 15.00    Total pack years: 15.00    Types: Cigarettes    Quit date: 03/04/2004    Years since quitting: 18.0   Smokeless tobacco: Never  Vaping Use   Vaping Use: Never used  Substance and Sexual Activity   Alcohol use: Yes    Comment: occ   Drug use: No   Sexual activity: Yes    Birth control/protection: Surgical    Comment: btl  Other Topics Concern   Not on file  Social History Narrative   Not on file   Social Determinants of Health   Financial Resource Strain: Not on file  Food Insecurity: Not on file  Transportation Needs: Not on file  Physical Activity: Not on file  Stress: Not on file  Social Connections: Not on file  Intimate Partner Violence: Not on file    Current Outpatient Medications on File Prior to Visit  Medication Sig Dispense Refill   azelastine (ASTELIN) 0.1 % nasal spray Place 1 spray into both nostrils 2 (two) times daily. Use in each nostril as directed 30 mL 0   benzonatate (TESSALON) 100 MG capsule Take 1 capsule (100 mg total) by mouth 3 (three) times daily as needed for cough. 30 capsule 0   blood glucose meter kit and supplies Dispense based on patient and insurance preference. Use in the morning when fasting and 2 hours after largest meal. (FOR ICD-10 E10.9, E11.9). 1 each 0   Continuous  Blood Gluc Receiver (FREESTYLE LIBRE 2 READER) DEVI 1 each by Does not apply route daily. 1 each 0   Continuous Blood Gluc Sensor (FREESTYLE LIBRE 3 SENSOR) MISC 1 each by Does not apply route every 14 (fourteen) days. 6 each 3   doxycycline (VIBRA-TABS) 100 MG tablet Take 1 tablet (100 mg total) by mouth 2 (two) times daily. 14 tablet 0   fluticasone (FLONASE) 50 MCG/ACT nasal spray 1 spray each nostril after sinus rinse twice daily (Patient taking differently: Place 1 spray into both nostrils daily.) 16 g  2   ibuprofen (ADVIL) 200 MG tablet Take 400 mg by mouth every 6 (six) hours as needed for moderate pain.     Injection Device for Insulin (CEQUR SIMPLICITY INSERTER) MISC Use as advised 1 each 0   insulin detemir (LEVEMIR) 100 UNIT/ML FlexPen Use 30 units in am and 30 units in pm under skin 45 mL 3   Insulin Pen Needle 32G X 4 MM MISC Use 5x a day 400 each 3   metFORMIN (GLUCOPHAGE-XR) 500 MG 24 hr tablet Take 2 tablets (1,000 mg total) by mouth 2 (two) times daily with a meal. 360 tablet 3   NOVOLOG FLEXPEN 100 UNIT/ML FlexPen Inject 8-16 Units into the skin 3 (three) times daily with meals. 45 mL 3   ONETOUCH VERIO test strip USE IN THE MORNING WHEN FASTING AND 2 HOURS AFTER LARGEST MEAL 200 each 0   rosuvastatin (CRESTOR) 5 MG tablet Take 1 tablet (5 mg total) by mouth daily. 90 tablet 3   traMADol (ULTRAM) 50 MG tablet Take 1 tablet (50 mg total) by mouth every 6 (six) hours as needed. 20 tablet 0   No current facility-administered medications on file prior to visit.    Allergies  Allergen Reactions   Lantus [Insulin Glargine] Other (See Comments)    headaches    Family History  Problem Relation Age of Onset   Cancer Mother        breast   Heart disease Mother    Stroke Mother    Diabetes Mother    Diabetes Father    Breast cancer Maternal Aunt    Colon cancer Neg Hx    Inflammatory bowel disease Neg Hx    Liver disease Neg Hx    Pancreatic cancer Neg Hx    Stomach cancer  Neg Hx    Esophageal cancer Neg Hx     PE: There were no vitals taken for this visit. Wt Readings from Last 3 Encounters:  11/19/21 176 lb 6.4 oz (80 kg)  09/05/21 177 lb (80.3 kg)  02/15/21 197 lb 1.5 oz (89.4 kg)   Constitutional: overweight, in NAD Eyes: no exophthalmos ENT: moist mucous membranes, no masses palpated in neck, no cervical lymphadenopathy Cardiovascular: RRR, No MRG Respiratory: CTA B Musculoskeletal: no deformities Skin: moist, warm, no rashes Neurological: no tremor with outstretched hands  ASSESSMENT: 1. DM2, insulin-dependent, uncontrolled, with  long-term complications -Dupuytren contractions  -Adhesive capsulitis  2. HL  3. Overweight  PLAN:  1. Patient with longstanding, uncontrolled type 2 diabetes, with poor medication adherence.  In 08/2021, she returned after long absence, of almost all of her medicines.  HbA1c was extremely high, at 12.9%.  We discussed that this could have had catastrophic consequences for her.  We restarted her medications (metformin, insulins) but not the Ozempic since she had nausea and diarrhea with it in the past.  We did discuss that if she is not able to come for the visit and take her medications as recommended, I would not be able to help her.  She agreed to start taking better care of her diabetes. At last visit, sugars are still extremely high, 100% above target.  We increased her insulin doses and I also advised her to look into whether an insulin pump (CeQur simplicity) will be covered for her.     longstanding, uncontrolled, type 2 diabetes, poorly tolerant at last visit after long absence and off almost all of her medications.  At that time, HbA1c was extremely high, at  12.9% and we discussed that this could have catastrophic consequences for her.  We restarted her metformin and also her basal-bolus insulin regimen.  We did not restart Ozempic since she had to stop due to nausea and diarrhea.  CGM interpretation: -At  today's visit, we reviewed her CGM downloads: It appears that *** of values are in target range (goal >70%), while *** are higher than 180 (goal <25%), and *** are lower than 70 (goal <4%).  The calculated average blood sugar is ***.  The projected HbA1c for the next 3 months (GMI) is ***. -Reviewing the CGM trends, ***  - I suggested to:  Patient Instructions  Please continue: - Metformin ER 1000 mg 2x a day, with meals - Levemir 40 units 2x a day - Novolog 20-24 units before meals  Try to start the Cequr Simplicity pump: - 13-24 clicks per meal   Please return in 3 months.   - we checked her HbA1c: 7%  - advised to check sugars at different times of the day - 4x a day, rotating check times - advised for yearly eye exams >> she is UTD - return to clinic in 3 months  2. HL -Reviewed latest lipid panel from 2021: Fractions at goal: Lab Results  Component Value Date   CHOL 131 02/01/2020   HDL 46 02/01/2020   LDLCALC 65 02/01/2020   LDLDIRECT 104.2 03/09/2014   TRIG 107 02/01/2020   CHOLHDL 2.8 02/01/2020  -Previously on Crestor 5 mg daily without side effects, but came off.  I strongly advised her to restart it. -She is due for another lipid panel  3.  Overweight -She cannot continue the GLP-1 receptor agonist due to previous GI intolerance.  We have her on metformin which has an appetite suppressant effect. -She previously lost 20 pounds most likely due to glucotoxicity, but at last visit weight stabilized  Philemon Kingdom, MD PhD [facility]

## 2022-03-25 ENCOUNTER — Telehealth: Payer: BC Managed Care – PPO | Admitting: Physician Assistant

## 2022-03-25 DIAGNOSIS — B9689 Other specified bacterial agents as the cause of diseases classified elsewhere: Secondary | ICD-10-CM | POA: Diagnosis not present

## 2022-03-25 DIAGNOSIS — J208 Acute bronchitis due to other specified organisms: Secondary | ICD-10-CM

## 2022-03-25 MED ORDER — PROMETHAZINE-DM 6.25-15 MG/5ML PO SYRP: 5.0000 mL | ORAL_SOLUTION | Freq: Four times a day (QID) | ORAL | 0 refills | Status: DC | PRN

## 2022-03-25 MED ORDER — BENZONATATE 100 MG PO CAPS
100.0000 mg | ORAL_CAPSULE | Freq: Three times a day (TID) | ORAL | 0 refills | Status: AC | PRN
Start: 2022-03-25 — End: ?

## 2022-03-25 MED ORDER — AZITHROMYCIN 250 MG PO TABS: ORAL_TABLET | ORAL | 0 refills | Status: AC

## 2022-03-25 MED ORDER — ALBUTEROL SULFATE HFA 108 (90 BASE) MCG/ACT IN AERS: 2.0000 | INHALATION_SPRAY | Freq: Four times a day (QID) | RESPIRATORY_TRACT | 0 refills | Status: AC | PRN

## 2022-03-25 NOTE — Patient Instructions (Signed)
Ricky Ala, thank you for joining Mar Daring, PA-C for today's virtual visit.  While this provider is not your primary care provider (PCP), if your PCP is located in our provider database this encounter information will be shared with them immediately following your visit.  Consent: (Patient) Lindsey Mason provided verbal consent for this virtual visit at the beginning of the encounter.  Current Medications:  Current Outpatient Medications:    albuterol (VENTOLIN HFA) 108 (90 Base) MCG/ACT inhaler, Inhale 2 puffs into the lungs every 6 (six) hours as needed for wheezing or shortness of breath., Disp: 8 g, Rfl: 0   azithromycin (ZITHROMAX) 250 MG tablet, Take 2 tablets on day 1, then 1 tablet daily on days 2 through 5, Disp: 6 tablet, Rfl: 0   benzonatate (TESSALON) 100 MG capsule, Take 1 capsule (100 mg total) by mouth 3 (three) times daily as needed., Disp: 30 capsule, Rfl: 0   promethazine-dextromethorphan (PROMETHAZINE-DM) 6.25-15 MG/5ML syrup, Take 5 mLs by mouth 4 (four) times daily as needed., Disp: 118 mL, Rfl: 0   azelastine (ASTELIN) 0.1 % nasal spray, Place 1 spray into both nostrils 2 (two) times daily. Use in each nostril as directed, Disp: 30 mL, Rfl: 0   blood glucose meter kit and supplies, Dispense based on patient and insurance preference. Use in the morning when fasting and 2 hours after largest meal. (FOR ICD-10 E10.9, E11.9)., Disp: 1 each, Rfl: 0   Continuous Blood Gluc Receiver (FREESTYLE LIBRE 2 READER) DEVI, 1 each by Does not apply route daily., Disp: 1 each, Rfl: 0   Continuous Blood Gluc Sensor (FREESTYLE LIBRE 3 SENSOR) MISC, 1 each by Does not apply route every 14 (fourteen) days., Disp: 6 each, Rfl: 3   fluticasone (FLONASE) 50 MCG/ACT nasal spray, 1 spray each nostril after sinus rinse twice daily (Patient taking differently: Place 1 spray into both nostrils daily.), Disp: 16 g, Rfl: 2   ibuprofen (ADVIL) 200 MG tablet, Take 400 mg by mouth every 6 (six)  hours as needed for moderate pain., Disp: , Rfl:    Injection Device for Insulin (CEQUR SIMPLICITY INSERTER) MISC, Use as advised, Disp: 1 each, Rfl: 0   insulin detemir (LEVEMIR) 100 UNIT/ML FlexPen, Use 30 units in am and 30 units in pm under skin, Disp: 45 mL, Rfl: 3   Insulin Pen Needle 32G X 4 MM MISC, Use 5x a day, Disp: 400 each, Rfl: 3   metFORMIN (GLUCOPHAGE-XR) 500 MG 24 hr tablet, Take 2 tablets (1,000 mg total) by mouth 2 (two) times daily with a meal., Disp: 360 tablet, Rfl: 3   NOVOLOG FLEXPEN 100 UNIT/ML FlexPen, Inject 8-16 Units into the skin 3 (three) times daily with meals., Disp: 45 mL, Rfl: 3   ONETOUCH VERIO test strip, USE IN THE MORNING WHEN FASTING AND 2 HOURS AFTER LARGEST MEAL, Disp: 200 each, Rfl: 0   rosuvastatin (CRESTOR) 5 MG tablet, Take 1 tablet (5 mg total) by mouth daily., Disp: 90 tablet, Rfl: 3   traMADol (ULTRAM) 50 MG tablet, Take 1 tablet (50 mg total) by mouth every 6 (six) hours as needed., Disp: 20 tablet, Rfl: 0   Medications ordered in this encounter:  Meds ordered this encounter  Medications   azithromycin (ZITHROMAX) 250 MG tablet    Sig: Take 2 tablets on day 1, then 1 tablet daily on days 2 through 5    Dispense:  6 tablet    Refill:  0    Order Specific  Question:   Supervising Provider    Answer:   Chase Picket [[id number]]   benzonatate (TESSALON) 100 MG capsule    Sig: Take 1 capsule (100 mg total) by mouth 3 (three) times daily as needed.    Dispense:  30 capsule    Refill:  0    Order Specific Question:   Supervising Provider    Answer:   Chase Picket [id number]   promethazine-dextromethorphan (PROMETHAZINE-DM) 6.25-15 MG/5ML syrup    Sig: Take 5 mLs by mouth 4 (four) times daily as needed.    Dispense:  118 mL    Refill:  0    Order Specific Question:   Supervising Provider    Answer:   Chase Picket [[id number]]   albuterol (VENTOLIN HFA) 108 (90 Base) MCG/ACT inhaler    Sig: Inhale 2 puffs into the lungs every 6 (six)  hours as needed for wheezing or shortness of breath.    Dispense:  8 g    Refill:  0    Order Specific Question:   Supervising Provider    Answer:   Chase Picket [id number]     *If you need refills on other medications prior to your next appointment, please contact your pharmacy*  Follow-Up: Call back or seek an in-person evaluation if the symptoms worsen or if the condition fails to improve as anticipated.  [facility] [phone]  Other Instructions Acute Bronchitis, Adult  Acute bronchitis is sudden inflammation of the main airways (bronchi) that come off the windpipe (trachea) in the lungs. The swelling causes the airways to get smaller and make more mucus than normal. This can make it hard to breathe and can cause coughing or noisy breathing (wheezing). Acute bronchitis may last several weeks. The cough may last longer. Allergies, asthma, and exposure to smoke may make the condition worse. What are the causes? This condition can be caused by germs and by substances that irritate the lungs, including: Cold and flu viruses. The most common cause of this condition is the virus that causes the common cold. Bacteria. This is less common. Breathing in substances that irritate the lungs, including: Smoke from cigarettes and other forms of tobacco. Dust and pollen. Fumes from household cleaning products, gases, or burned fuel. Indoor or outdoor air pollution. What increases the risk? The following factors may make you more likely to develop this condition: A weak body's defense system, also called the immune system. A condition that affects your lungs and breathing, such as asthma. What are the signs or symptoms? Common symptoms of this condition include: Coughing. This may bring up clear, yellow, or green mucus from your lungs (sputum). Wheezing. Runny or stuffy nose. Having too much mucus in your lungs (chest congestion). Shortness of breath. Aches and pains,  including sore throat or chest. How is this diagnosed? This condition is usually diagnosed based on: Your symptoms and medical history. A physical exam. You may also have other tests, including tests to rule out other conditions, such as pneumonia. These tests include: A test of lung function. Test of a mucus sample to look for the presence of bacteria. Tests to check the oxygen level in your blood. Blood tests. Chest X-ray. How is this treated? Most cases of acute bronchitis clear up over time without treatment. Your health care provider may recommend: Drinking more fluids to help thin your mucus so it is easier to cough up. Taking inhaled medicine (inhaler) to improve air flow in and out  of your lungs. Using a vaporizer or a humidifier. These are machines that add water to the air to help you breathe better. Taking a medicine that thins mucus and clears congestion (expectorant). Taking a medicine that prevents or stops coughing (cough suppressant). It is not common to take an antibiotic medicine for this condition. Follow these instructions at home:  Take over-the-counter and prescription medicines only as told by your health care provider. Use an inhaler, vaporizer, or humidifier as told by your health care provider. Take two teaspoons (10 mL) of honey at bedtime to lessen coughing at night. Drink enough fluid to keep your urine pale yellow. Do not use any products that contain nicotine or tobacco. These products include cigarettes, chewing tobacco, and vaping devices, such as e-cigarettes. If you need help quitting, ask your health care provider. Get plenty of rest. Return to your normal activities as told by your health care provider. Ask your health care provider what activities are safe for you. Keep all follow-up visits. This is important. How is this prevented? To lower your risk of getting this condition again: Wash your hands often with soap and water for at least 20  seconds. If soap and water are not available, use hand sanitizer. Avoid contact with people who have cold symptoms. Try not to touch your mouth, nose, or eyes with your hands. Avoid breathing in smoke or chemical fumes. Breathing smoke or chemical fumes will make your condition worse. Get the flu shot every year. Contact a health care provider if: Your symptoms do not improve after 2 weeks. You have trouble coughing up the mucus. Your cough keeps you awake at night. You have a fever. Get help right away if you: Cough up blood. Feel pain in your chest. Have severe shortness of breath. Faint or keep feeling like you are going to faint. Have a severe headache. Have a fever or chills that get worse. These symptoms may represent a serious problem that is an emergency. Do not wait to see if the symptoms will go away. Get medical help right away. Call your local emergency services (911 in the [place] Do not drive yourself to the hospital. Summary Acute bronchitis is inflammation of the main airways (bronchi) that come off the windpipe (trachea) in the lungs. The swelling causes the airways to get smaller and make more mucus than normal. Drinking more fluids can help thin your mucus so it is easier to cough up. Take over-the-counter and prescription medicines only as told by your health care provider. Do not use any products that contain nicotine or tobacco. These products include cigarettes, chewing tobacco, and vaping devices, such as e-cigarettes. If you need help quitting, ask your health care provider. Contact a health care provider if your symptoms do not improve after 2 weeks. This information is not intended to replace advice given to you by your health care provider. Make sure you discuss any questions you have with your health care provider. Document Revised: 09/27/2021 Document Reviewed: 10/18/2020 Elsevier Patient Education  [place].    If you have been instructed to have  an in-person evaluation today at a local Urgent Care facility, please use the link below. It will take you to a list of all of our available [facility], including address, phone number and hours of operation. Please do not delay care.   Urgent Cares  If you or a family member do not have a primary care provider, use the link below to  schedule a visit and establish care. When you choose a [facility] primary care physician or advanced practice provider, you gain a long-term partner in health. Find a Primary Care Provider  Learn more about 's in-office and virtual care options: [facility]

## 2022-03-25 NOTE — Progress Notes (Signed)
Virtual Visit Consent   Lindsey Mason, you are scheduled for a virtual visit with a [name] provider today. Just as with appointments in the office, your consent must be obtained to participate. Your consent will be active for this visit and any virtual visit you may have with one of our providers in the next 365 days. If you have a MyChart account, a copy of this consent can be sent to you electronically.  As this is a virtual visit, video technology does not allow for your provider to perform a traditional examination. This may limit your provider's ability to fully assess your condition. If your provider identifies any concerns that need to be evaluated in person or the need to arrange testing (such as labs, EKG, etc.), we will make arrangements to do so. Although advances in technology are sophisticated, we cannot ensure that it will always work on either your end or our end. If the connection with a video visit is poor, the visit may have to be switched to a telephone visit. With either a video or telephone visit, we are not always able to ensure that we have a secure connection.  By engaging in this virtual visit, you consent to the provision of healthcare and authorize for your insurance to be billed (if applicable) for the services provided during this visit. Depending on your insurance coverage, you may receive a charge related to this service.  I need to obtain your verbal consent now. Are you willing to proceed with your visit today? [name] has provided verbal consent on 03/25/2022 for a virtual visit (video or telephone). Mar Daring, PA-C  Date: 03/25/2022 2:32 PM  Virtual Visit via Video Note   I, Mar Daring, connected with  Lindsey Mason  ([id number], Jul 27, 1970) on 03/25/22 at  2:15 PM EDT by a video-enabled telemedicine application and verified that I am speaking with the correct person using two identifiers.  Location: Patient: Virtual Visit Location Patient:  Home Provider: Virtual Visit Location Provider: Home Office   I discussed the limitations of evaluation and management by telemedicine and the availability of in person appointments. The patient expressed understanding and agreed to proceed.    History of Present Illness: Lindsey Mason is a 51 y.o. who identifies as a female who was assigned female at birth, and is being seen today for cough.  HPI: Cough This is a new problem. The current episode started 1 to 4 weeks ago (9 days). The problem has been gradually worsening. The problem occurs every few minutes. The cough is Productive of sputum and productive of purulent sputum. Associated symptoms include chills, a fever (100), headaches (with cough), myalgias, a sore throat (scratchy), sweats and wheezing. Pertinent negatives include no ear congestion, ear pain, nasal congestion, postnasal drip, rhinorrhea or shortness of breath. The symptoms are aggravated by lying down. Treatments tried: theraflu, delsym. The treatment provided no relief. Her past medical history is significant for bronchitis and pneumonia. There is no history of asthma.    At home Covid 19 testing was negative x 2   Problems:  Patient Active Problem List   Diagnosis Date Noted   Nausea and vomiting 12/24/2018   Nausea & vomiting 12/24/2018   Generalized abdominal pain 12/24/2018   Statin declined 05/05/2018   Bloating 03/10/2018   Alternating constipation and diarrhea 03/10/2018   Pyrosis 03/10/2018   Abnormal liver ultrasound 03/10/2018   Calculus of gallbladder without cholecystitis without obstruction 03/10/2018   h/o  Enlarged ovaries 11/20/2017   Environmental and seasonal allergies 11/04/2017   Psychophysiological insomnia 11/04/2017   Vitamin D deficiency 08/26/2017   Depression 09/03/2016   Overweight (BMI 25.0-29.9) 09/03/2016   Witnessed episode of apnea 09/03/2016   Urinary frequency 04/03/2016   Acute maxillary sinusitis 11/04/2014   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   PCOS (polycystic ovarian syndrome) 05/27/2012   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   Major depressive disorder, recurrent episode ([place]) 08/20/2010   Hyperlipidemia associated with type 2 diabetes mellitus ([place]) 04/11/2009   ACNE ROSACEA 04/11/2009   Poorly controlled type 2 diabetes mellitus ([place])- on insulin now for mgt 03/08/2009   GERD 03/08/2009    Allergies:  Allergies  Allergen Reactions   Lantus [Insulin Glargine] Other (See Comments)    headaches   Medications:  Current Outpatient Medications:    albuterol (VENTOLIN HFA) 108 (90 Base) MCG/ACT inhaler, Inhale 2 puffs into the lungs every 6 (six) hours as needed for wheezing or shortness of breath., Disp: 8 g, Rfl: 0   azithromycin (ZITHROMAX) 250 MG tablet, Take 2 tablets on day 1, then 1 tablet daily on days 2 through 5, Disp: 6 tablet, Rfl: 0   benzonatate (TESSALON) 100 MG capsule, Take 1 capsule (100 mg total) by mouth 3 (three) times daily as needed., Disp: 30 capsule, Rfl: 0   promethazine-dextromethorphan (PROMETHAZINE-DM) 6.25-15 MG/5ML syrup, Take 5 mLs by mouth 4 (four) times daily as needed., Disp: 118 mL, Rfl: 0   azelastine (ASTELIN) 0.1 % nasal spray, Place 1 spray into both nostrils 2 (two) times daily. Use in each nostril as directed, Disp: 30 mL, Rfl: 0   blood glucose meter kit and supplies, Dispense based on patient and insurance preference. Use in the morning when fasting and 2 hours after largest meal. (FOR ICD-10 E10.9, E11.9)., Disp: 1 each, Rfl: 0   Continuous Blood Gluc Receiver (FREESTYLE LIBRE 2 READER) DEVI, 1 each by Does not apply route daily., Disp: 1 each, Rfl: 0   Continuous Blood Gluc Sensor (FREESTYLE LIBRE 3 SENSOR) MISC, 1 each by Does not apply route every 14 (fourteen) days., Disp: 6 each, Rfl: 3   fluticasone (FLONASE) 50 MCG/ACT nasal spray, 1 spray each nostril after sinus rinse twice daily (Patient taking differently: Place 1 spray into both nostrils daily.),  Disp: 16 g, Rfl: 2   ibuprofen (ADVIL) 200 MG tablet, Take 400 mg by mouth every 6 (six) hours as needed for moderate pain., Disp: , Rfl:    Injection Device for Insulin (CEQUR SIMPLICITY INSERTER) MISC, Use as advised, Disp: 1 each, Rfl: 0   insulin detemir (LEVEMIR) 100 UNIT/ML FlexPen, Use 30 units in am and 30 units in pm under skin, Disp: 45 mL, Rfl: 3   Insulin Pen Needle 32G X 4 MM MISC, Use 5x a day, Disp: 400 each, Rfl: 3   metFORMIN (GLUCOPHAGE-XR) 500 MG 24 hr tablet, Take 2 tablets (1,000 mg total) by mouth 2 (two) times daily with a meal., Disp: 360 tablet, Rfl: 3   NOVOLOG FLEXPEN 100 UNIT/ML FlexPen, Inject 8-16 Units into the skin 3 (three) times daily with meals., Disp: 45 mL, Rfl: 3   ONETOUCH VERIO test strip, USE IN THE MORNING WHEN FASTING AND 2 HOURS AFTER LARGEST MEAL, Disp: 200 each, Rfl: 0   rosuvastatin (CRESTOR) 5 MG tablet, Take 1 tablet (5 mg total) by mouth daily., Disp: 90 tablet, Rfl: 3   traMADol (ULTRAM) 50 MG tablet, Take 1 tablet (50 mg total) by  mouth every 6 (six) hours as needed., Disp: 20 tablet, Rfl: 0  Observations/Objective: Patient is well-developed, well-nourished in no acute distress.  Resting comfortably at home.  Head is normocephalic, atraumatic.  No labored breathing.  Speech is clear and coherent with logical content.  Patient is alert and oriented at baseline.    Assessment and Plan: 1. Acute bacterial bronchitis - azithromycin (ZITHROMAX) 250 MG tablet; Take 2 tablets on day 1, then 1 tablet daily on days 2 through 5  Dispense: 6 tablet; Refill: 0 - benzonatate (TESSALON) 100 MG capsule; Take 1 capsule (100 mg total) by mouth 3 (three) times daily as needed.  Dispense: 30 capsule; Refill: 0 - promethazine-dextromethorphan (PROMETHAZINE-DM) 6.25-15 MG/5ML syrup; Take 5 mLs by mouth 4 (four) times daily as needed.  Dispense: 118 mL; Refill: 0 - albuterol (VENTOLIN HFA) 108 (90 Base) MCG/ACT inhaler; Inhale 2 puffs into the lungs every 6  (six) hours as needed for wheezing or shortness of breath.  Dispense: 8 g; Refill: 0  - Worsening over a week despite OTC medications - Will treat with Z-pack, Promethazine DM, Albuterol and tessalon perles - Can continue Mucinex  - Push fluids.  - Rest.  - Steam and humidifier can help - Seek in person evaluation if worsening or symptoms fail to improve    Follow Up Instructions: I discussed the assessment and treatment plan with the patient. The patient was provided an opportunity to ask questions and all were answered. The patient agreed with the plan and demonstrated an understanding of the instructions.  A copy of instructions were sent to the patient via MyChart unless otherwise noted below.    The patient was advised to call back or seek an in-person evaluation if the symptoms worsen or if the condition fails to improve as anticipated.  Time:  I spent 14 minutes with the patient via telehealth technology discussing the above problems/concerns.    Mar Daring, PA-C

## 2022-06-03 ENCOUNTER — Telehealth: Payer: Self-pay | Admitting: Nutrition

## 2022-06-03 NOTE — Telephone Encounter (Signed)
LVM to call me to see if she is still interested in pump therapy, or if she needs an education visit to help her control her blood sugars on injections.

## 2022-07-15 ENCOUNTER — Telehealth: Payer: Self-pay | Admitting: Nutrition

## 2022-07-15 NOTE — Telephone Encounter (Signed)
Message left on her machine to call me to see if she  ever go the [place], if she wants a 5 day free trial of this, or if she is still interested in the insulin pump.  Telephone number given to call me back

## 2022-07-23 NOTE — Telephone Encounter (Signed)
LVM to see if she is still interested in ceQur or insulin pump training

## 2022-07-24 NOTE — Telephone Encounter (Signed)
I agree.  I will keep trying to contact her

## 2022-07-24 NOTE — Telephone Encounter (Signed)
LVM to patient to call me if she wants to try the [place] or the OmniPod.  Told her that this was my 3rd call to her and I will not call her back again.(Pump company requires that I call the patient 3 times.)

## 2022-09-30 ENCOUNTER — Other Ambulatory Visit: Payer: Self-pay | Admitting: Internal Medicine

## 2022-10-05 ENCOUNTER — Other Ambulatory Visit: Payer: Self-pay | Admitting: Internal Medicine

## 2022-10-23 ENCOUNTER — Other Ambulatory Visit: Payer: Self-pay | Admitting: Internal Medicine

## 2022-10-23 DIAGNOSIS — E1165 Type 2 diabetes mellitus with hyperglycemia: Secondary | ICD-10-CM

## 2022-11-12 ENCOUNTER — Other Ambulatory Visit (HOSPITAL_COMMUNITY): Payer: Self-pay

## 2022-11-12 ENCOUNTER — Telehealth: Payer: Self-pay | Admitting: Pharmacy Technician

## 2022-11-12 NOTE — Telephone Encounter (Signed)
Pharmacy Patient Advocate Encounter  Received notification from [employer] that prior authorization for Dexcom is required/requested.  Per Test Claim: Freestyle Libre preferred by the ins. Looks like that's what the pt has been using.   If suggested medication is appropriate, Please send in a new RX and discontinue this one. If not, please advise as to why it's not appropriate so that we may request a Prior Authorization.

## 2022-12-01 ENCOUNTER — Other Ambulatory Visit: Payer: Self-pay | Admitting: Internal Medicine

## 2022-12-01 DIAGNOSIS — E1165 Type 2 diabetes mellitus with hyperglycemia: Secondary | ICD-10-CM

## 2022-12-01 DIAGNOSIS — E1142 Type 2 diabetes mellitus with diabetic polyneuropathy: Secondary | ICD-10-CM

## 2022-12-26 ENCOUNTER — Other Ambulatory Visit (HOSPITAL_COMMUNITY): Payer: Self-pay

## 2022-12-28 ENCOUNTER — Other Ambulatory Visit: Payer: Self-pay | Admitting: Internal Medicine

## 2022-12-28 DIAGNOSIS — E1165 Type 2 diabetes mellitus with hyperglycemia: Secondary | ICD-10-CM

## 2023-01-13 ENCOUNTER — Ambulatory Visit: Payer: BC Managed Care – PPO | Admitting: Internal Medicine

## 2023-01-13 ENCOUNTER — Encounter: Payer: Self-pay | Admitting: Internal Medicine

## 2023-01-13 VITALS — BP 130/80 | HR 88 | Ht 66.0 in | Wt 188.2 lb

## 2023-01-13 DIAGNOSIS — E785 Hyperlipidemia, unspecified: Secondary | ICD-10-CM

## 2023-01-13 DIAGNOSIS — Z794 Long term (current) use of insulin: Secondary | ICD-10-CM | POA: Diagnosis not present

## 2023-01-13 DIAGNOSIS — E1142 Type 2 diabetes mellitus with diabetic polyneuropathy: Secondary | ICD-10-CM

## 2023-01-13 DIAGNOSIS — E119 Type 2 diabetes mellitus without complications: Secondary | ICD-10-CM

## 2023-01-13 DIAGNOSIS — E1169 Type 2 diabetes mellitus with other specified complication: Secondary | ICD-10-CM | POA: Diagnosis not present

## 2023-01-13 DIAGNOSIS — Z7984 Long term (current) use of oral hypoglycemic drugs: Secondary | ICD-10-CM | POA: Diagnosis not present

## 2023-01-13 DIAGNOSIS — E1165 Type 2 diabetes mellitus with hyperglycemia: Secondary | ICD-10-CM

## 2023-01-13 DIAGNOSIS — E663 Overweight: Secondary | ICD-10-CM

## 2023-01-13 MED ORDER — METFORMIN HCL ER 500 MG PO TB24: 1000.0000 mg | ORAL_TABLET | Freq: Two times a day (BID) | ORAL | 1 refills | Status: AC

## 2023-01-13 MED ORDER — INSULIN ASPART 100 UNIT/ML IJ SOLN: INTRAMUSCULAR | 5 refills | Status: DC

## 2023-01-13 NOTE — Patient Instructions (Addendum)
Please continue: - Metformin ER 1000 mg 2x a day, with meals  Increase: - Levemir 40 units in am and 30 units at night - Novolog 20-24 units before meals   However,  Try to start the Omnipod dash ASAP.  - Basal rates: 12 am: 2 units/h - Insulin to carb ratio: 12 am: 1:3 (may need 1:2 if the sugars remain high after a meal) - Target: 12 am: 110 - Correction factor (insulin sensitivity factor):  12 am: 30 - Active insulin time: 4h  Please return in 3 months.

## 2023-01-13 NOTE — Progress Notes (Signed)
Patient ID: Lindsey Mason, female   DOB: 1971/01/15, 52 y.o.   MRN: [id number]   HPI: Lindsey Mason is a 52 y.o.-year-old female, returning for follow-up for DM2, prev. GDM with triplets in 1997, then dx'ed with DM right after the pregnancy, dx in 2005, insulin-dependent, uncontrolled, with complications (Dupuytren contractions, adhesive capsulitis).  Last visit 1 year and 2 months ago.  Interim history: No increased urination, blurry vision, nausea, chest pain. The CeQur pump was not approved by her insurance, but she got the [employer]. Sugars improved, but then came off after she ran out of insulin vials.   Reviewed HbA1c levels: Lab Results  Component Value Date   HGBA1C 12.1 (A) 11/19/2021   HGBA1C 12.9 (A) 09/05/2021   HGBA1C 12.9 (A) 07/03/2020   HGBA1C 11.1 (H) 02/01/2020   HGBA1C 8.8 (H) 06/28/2019   HGBA1C 6.2 (A) 05/04/2019   HGBA1C 8.4 (A) 02/04/2019   HGBA1C 11.3 (H) 12/25/2018   HGBA1C 5.9 01/27/2018   HGBA1C 9.6 11/04/2017   Prev. on: - Metformin ER 2000 mg with dinner >> 1000 mg 2x a day >> takes it occasionally - Ozempic 1 mg weekly  -increase 01/2020 >> stopped 2/2 nausea, diarrhea - Levemir 10 >> 20 units in am and 20 at night -restarted 07/2020 >> 20 units in a.m. and 30 units at night >> every once in a while, 30 units 2x a day - Novolog 7-10 >> 7-10-7 units 15 min before meals -restarted 07/2020 >> 10-16 units before each meal >> not using   At last visit she was on:: - Metformin ER 1000 mg 2x a day, with meals - Levemir 30 >>  >> still using 30 units 2x a day - Novolog 14-16 units before each meal - 12 units once a day at night - she is not taking NovoLog consistently, mostly once a day at night, due to inconvenience of injecting it at work and having children with her all the time (school) >>  >> still using 10-12 units bid before B and D  Pt.checks her sugars more than 4 times a day with her CGM:  Previously following  Previously:  Lowest sugar was 60 >>  287 >> 80 x1 >> 150 >> 200 >>60s while on the pump; she has hypoglycemia awareness at 80.  Highest sugar was 500 >> 350 >> 400s >> HI >> HI.  Glucometer: One Touch verio.  Pt's meals are -  - Breakfast: Protein bar - Protein shake - Lunch: soup, mashed potato, penne pasta - Dinner: lean meat + green veggies - Snacks: no snacks after dinner  -No CKD, last BUN/creatinine:  Lab Results  Component Value Date   BUN 13 02/12/2021   BUN 15 10/13/2020   CREATININE 0.50 02/12/2021   CREATININE 0.61 10/13/2020  Not on ACE inhibitor/ARB.  -+ HL; last set of lipids: Lab Results  Component Value Date   CHOL 131 02/01/2020   HDL 46 02/01/2020   LDLCALC 65 02/01/2020   LDLDIRECT 104.2 03/09/2014   TRIG 107 02/01/2020   CHOLHDL 2.8 02/01/2020  On Crestor 5 mg daily - ran out.  - last eye exam was in 10/2022: no DR reportedly  -+ Numbness and tingling in her right foot.  Last foot exam was at last visit, 10/2021.  She has Dupuytren contractions and adhesive capsulitis. She also has a history of PCOS.  Pt has FH of DM in mother and father. No FH of MTC or personal pancreatitis hx.  ROS: +  see HPI  I reviewed pt's medications, allergies, PMH, social hx, family hx, and changes were documented in the history of present illness. Otherwise, unchanged from my initial visit note.   Past Medical History:  Diagnosis Date   Clomid pregnancy 1997, 2005   Depression    Diabetes mellitus without mention of complication    Esophageal reflux    Headache(784.0)    Infertility, female    PCOS - Clomid pregnancies    Ovarian cyst 2019   PCOS (polycystic ovarian syndrome) 05/27/2012   PONV (postoperative nausea and vomiting)    nausea   Pure hypercholesterolemia    Rosacea    Status post appendectomy 11/19/2017    Past Surgical History:  Procedure Laterality Date   APPENDECTOMY     CESAREAN SECTION     x 3, last one with triplets   CHOLECYSTECTOMY N/A 12/25/2018   Procedure:  LAPAROSCOPIC CHOLECYSTECTOMY WITH INTRAOPERATIVE CHOLANGIOGRAM;  Surgeon: Griselda Miner, MD;  Location: [facility];  Service: General;  Laterality: N/A;   FASCIECTOMY Right 02/15/2021   Procedure: FASCIECTOMY RIGHT MIDDLE, RING AND SMALL FINGERS;  Surgeon: Cindee Salt, MD;  Location: [facility];  Service: Orthopedics;  Laterality: Right;  AXILLARY BLOCK   LAPAROSCOPIC BILATERAL SALPINGECTOMY Right 02/16/2018   Procedure: LAPAROSCOPIC RIGHT SALPINGECTOMY;  Surgeon: Jerene Bears, MD;  Location: [facility];  Service: Gynecology;  Laterality: Right;   TUBAL LIGATION      Social History   Socioeconomic History   Marital status: Legally Separated    Spouse name: Not on file   Number of children: 5   Years of education: Not on file   Highest education level: Not on file  Occupational History   Occupation: music minister    Employer: [employer]  Tobacco Use   Smoking status: Former    Current packs/day: 0.00    Average packs/day: 1 pack/day for 15.0 years (15.0 ttl pk-yrs)    Types: Cigarettes    Start date: 03/04/1989    Quit date: 03/04/2004    Years since quitting: 18.8   Smokeless tobacco: Never  Vaping Use   Vaping status: Never Used  Substance and Sexual Activity   Alcohol use: Yes    Comment: occ   Drug use: No   Sexual activity: Yes    Birth control/protection: Surgical    Comment: btl  Other Topics Concern   Not on file  Social History Narrative   Not on file   Social Determinants of Health   Financial Resource Strain: Not on file  Food Insecurity: Not on file  Transportation Needs: Not on file  Physical Activity: Not on file  Stress: Not on file  Social Connections: Not on file  Intimate Partner Violence: Not on file    Current Outpatient Medications on File Prior to Visit  Medication Sig Dispense Refill   albuterol (VENTOLIN HFA) 53 (90 Base) MCG/ACT inhaler Inhale 2 puffs into the lungs every 6 (six) hours as needed for wheezing  or shortness of breath. 8 g 0   azelastine (ASTELIN) 0.1 % nasal spray Place 1 spray into both nostrils 2 (two) times daily. Use in each nostril as directed 30 mL 0   benzonatate (TESSALON) 100 MG capsule Take 1 capsule (100 mg total) by mouth 3 (three) times daily as needed. 30 capsule 0   blood glucose meter kit and supplies Dispense based on patient and insurance preference. Use in the morning when fasting and 2 hours after largest  meal. (FOR ICD-10 E10.9, E11.9). 1 each 0   Continuous Blood Gluc Receiver (FREESTYLE LIBRE 2 READER) DEVI 1 each by Does not apply route daily. 1 each 0   Continuous Glucose Sensor (FREESTYLE LIBRE 3 SENSOR) MISC APPLY 1 SENSOR ONCE EVERY 14 DAYS 6 each 0   fluticasone (FLONASE) 50 MCG/ACT nasal spray 1 spray each nostril after sinus rinse twice daily (Patient taking differently: Place 1 spray into both nostrils daily.) 16 g 2   ibuprofen (ADVIL) 200 MG tablet Take 400 mg by mouth every 6 (six) hours as needed for moderate pain.     Injection Device for Insulin (CEQUR SIMPLICITY INSERTER) MISC Use as advised 1 each 0   insulin detemir (LEVEMIR FLEXPEN) 100 UNIT/ML FlexPen INJECT 40 UNITS SUBCUTANEOUSLY IN THE MORNING AND IN THE EVENING 45 mL 0   Insulin Pen Needle 32G X 4 MM MISC Use 5x a day 400 each 3   metFORMIN (GLUCOPHAGE-XR) 500 MG 24 hr tablet TAKE 2 TABLETS BY MOUTH TWICE DAILY WITH A MEAL 120 tablet 0   NOVOLOG FLEXPEN 100 UNIT/ML FlexPen INJECT 8 TO 16 UNITS SUBCUTANEOUSLY THREE TIMES DAILY WITH MEALS 30 mL 0   ONETOUCH VERIO test strip USE IN THE MORNING WHEN FASTING AND 2 HOURS AFTER LARGEST MEAL 200 each 0   promethazine-dextromethorphan (PROMETHAZINE-DM) 6.25-15 MG/5ML syrup Take 5 mLs by mouth 4 (four) times daily as needed. 118 mL 0   rosuvastatin (CRESTOR) 5 MG tablet Take 1 tablet (5 mg total) by mouth daily. 90 tablet 3   traMADol (ULTRAM) 50 MG tablet Take 1 tablet (50 mg total) by mouth every 6 (six) hours as needed. 20 tablet 0   No current  facility-administered medications on file prior to visit.    Allergies  Allergen Reactions   Lantus [Insulin Glargine] Other (See Comments)    headaches    Family History  Problem Relation Age of Onset   Cancer Mother        breast   Heart disease Mother    Stroke Mother    Diabetes Mother    Diabetes Father    Breast cancer Maternal Aunt    Colon cancer Neg Hx    Inflammatory bowel disease Neg Hx    Liver disease Neg Hx    Pancreatic cancer Neg Hx    Stomach cancer Neg Hx    Esophageal cancer Neg Hx    PE: BP 130/80   Pulse 88   Ht 5\' 6"  (1.676 m)   Wt 188 lb 3.2 oz (85.4 kg)   SpO2 99%   BMI 30.38 kg/m  Wt Readings from Last 3 Encounters:  01/13/23 188 lb 3.2 oz (85.4 kg)  11/19/21 176 lb 6.4 oz (80 kg)  09/05/21 177 lb (80.3 kg)   Constitutional: overweight, in NAD Eyes:  EOMI, no exophthalmos ENT: no neck masses, no cervical lymphadenopathy Cardiovascular: RRR, No MRG Respiratory: CTA B Musculoskeletal: no deformities Skin:no rashes Neurological: no tremor with outstretched hands Diabetic Foot Exam - Simple   Simple Foot Form Diabetic Foot exam was performed with the following findings: Yes 01/13/2023  3:51 PM  Visual Inspection No deformities, no ulcerations, no other skin breakdown bilaterally: Yes Sensation Testing Intact to touch and monofilament testing bilaterally: Yes Pulse Check Posterior Tibialis and Dorsalis pulse intact bilaterally: Yes Comments    ASSESSMENT: 1. DM2, insulin-dependent, uncontrolled, with  long-term complications -Dupuytren contractions  -Adhesive capsulitis  2. HL  3. Overweight  PLAN:  1. Patient with longstanding, uncontrolled,  type 2 diabetes, with noncompliance with medications and appointments.  Last visit was 1 year and 2 months ago.  At that time, HbA1c was still very high, at 12.1%.  She previously had long absences from the clinic and being off her medications and returned in 2023 with an HbA1c of 12.9%.  We  discussed that this could have catastrophic consequences for her.  We restarted her metformin and basal/bolus insulin regimen.  We could not restart Ozempic due to nausea and diarrhea.  At that time, we did discuss that if she was not able to come for visits and take her medications as prescribed, I would not be able to help her.  At last visit, sugars were extremely high, 100% above target.  Upon questioning, she was taking the Levemir but did not increase the dose despite the elevated blood sugars.  Also, she was taking a low-dose NovoLog only once a day, before dinner.  We increased her Levemir dose and advised her to use a higher dose of NovoLog and to take it before each meal.  We did discuss about the possibility of using a CeQur simplicity patch pump.  I showed her the pump and explained how it worked.  I sent a prescription to her pharmacy and advised her to let me know if this was covered by her insurance so that I could refer her to diabetes education.  We discussed that if she was not able to get the pump, to continue NovoLog consistently.  However, the CeQur pump was not covered for her and she mentions that she the OmniPod Dash pump but ran out of insulin vials and had to stop.  Currently back on basal-bolus insulin regimen along with metformin. -However, she now returns after another long absence.  She mentions that her daughter was diagnosed with the eating disorder and she would like to start gaining control of her diabetes to help her. CGM interpretation: -At today's visit, we reviewed her CGM downloads: It appears that 3% of values are in target range (goal >70%), while 97% are higher than 180 (goal <25%), and 0% are lower than 70 (goal <4%).  The calculated average blood sugar is 339.  The projected HbA1c for the next 3 months (GMI) is 11.4%. -Reviewing the CGM trends, sugars appear to be very high, decreasing overnight, but not to the normal range, and increasing in a stepwise fashion  throughout the day, after every meal.  Her sugars appear to have been lower when she was on the OmniPod before we discussed about the need to restart this.  Before she is able to start this I advised her to increase her Levemir and NovoLog doses but the soonest possible, to start the OmniPod Dash pump.  I gave her a set of starting pump settings.  She mentions that she is eating up to 40 g of carbs per meal.  We discussed about starting with an 1: 3 insulin to carb ratio but to increase it to 1: 2 if the sugars remain high after meals. -At next visit, I would like her to start on the OmniPod 5 integrated with the Dexcom CGM.  By that time, I am hoping that it will integrated with the Dexcom G7.  She will probably need to use an OmniPod every 2 days. - I suggested to:  Patient Instructions  Please continue: - Metformin ER 1000 mg 2x a day, with meals  Increase: - Levemir 40 units in am and 30 units at night -  Novolog 20-24 units before meals   However,  Try to start the Omnipod dash ASAP.  - Basal rates: 12 am: 2 units/h - Insulin to carb ratio: 12 am: 1:3 (may need 1:2 if the sugars remain high after a meal) - Target: 12 am: 110 - Correction factor (insulin sensitivity factor):  12 am: 30 - Active insulin time: 4h  Please return in 3 months.   - we checked her HbA1c: 11.9% (slightly lower, but still very high). - advised to check sugars at different times of the day - 4x a day, rotating check times - advised for yearly eye exams >> she is UTD - will check annual labs today - return to clinic in 3-4 months  2. HL -Reviewed latest lipid panel from 2021: All fractions at goal: Lab Results  Component Value Date   CHOL 131 02/01/2020   HDL 46 02/01/2020   LDLCALC 65 02/01/2020   LDLDIRECT 104.2 03/09/2014   TRIG 107 02/01/2020   CHOLHDL 2.8 02/01/2020  -She was on Crestor 5 mg daily without side effects -came off... I previously sent a prescription for this and strongly advised  her to start it.  I sent another prescription today as she is again off the medication. -She is due for another lipid panel-will check this today  3.  Overweight -She cannot continue the GLP-1 receptor agonist due to previous GI intolerance.  At last visit we restarted metformin which has an appetite suppressant effect long-term -She lost approximately 20 pounds in 2022-2023 due to glucotoxicity -She gained 12 pounds since last visit  Carlus Pavlov, MD PhD [facility]

## 2023-01-14 ENCOUNTER — Encounter: Payer: Self-pay | Admitting: Internal Medicine

## 2023-01-14 LAB — MICROALBUMIN / CREATININE URINE RATIO
Creatinine,U: 43.3 mg/dL
Microalb Creat Ratio: 1.9 mg/g (ref 0.0–30.0)
Microalb, Ur: 0.8 mg/dL (ref 0.0–1.9)

## 2023-01-14 LAB — COMPREHENSIVE METABOLIC PANEL
ALT: 21 U/L (ref 0–35)
AST: 13 U/L (ref 0–37)
Albumin: 4.5 g/dL (ref 3.5–5.2)
Alkaline Phosphatase: 102 U/L (ref 39–117)
BUN: 10 mg/dL (ref 6–23)
CO2: 27 mEq/L (ref 19–32)
Calcium: 10.4 mg/dL (ref 8.4–10.5)
Chloride: 101 mEq/L (ref 96–112)
Creatinine, Ser: 0.54 mg/dL (ref 0.40–1.20)
GFR: 105.5 mL/min (ref >60.00)
Glucose, Bld: 259 mg/dL — ABNORMAL HIGH (ref 70–99)
Potassium: 4.5 mEq/L (ref 3.5–5.1)
Sodium: 137 mEq/L (ref 135–145)
Total Bilirubin: 0.6 mg/dL (ref 0.2–1.2)
Total Protein: 7.1 g/dL (ref 6.0–8.3)

## 2023-01-14 LAB — LIPID PANEL
Cholesterol: 170 mg/dL (ref 0–200)
HDL: 49.5 mg/dL (ref >39.00)
LDL Cholesterol: 87 mg/dL (ref 0–99)
NonHDL: 120.73
Total CHOL/HDL Ratio: 3
Triglycerides: 170 mg/dL — ABNORMAL HIGH (ref 0.0–149.0)
VLDL: 34 mg/dL (ref 0.0–40.0)

## 2023-01-31 ENCOUNTER — Other Ambulatory Visit: Payer: Self-pay | Admitting: Internal Medicine

## 2023-01-31 DIAGNOSIS — E1165 Type 2 diabetes mellitus with hyperglycemia: Secondary | ICD-10-CM

## 2023-02-11 ENCOUNTER — Telehealth: Payer: Self-pay | Admitting: Pharmacy Technician

## 2023-02-11 DIAGNOSIS — E1142 Type 2 diabetes mellitus with diabetic polyneuropathy: Secondary | ICD-10-CM

## 2023-02-11 NOTE — Telephone Encounter (Signed)
Pharmacy Patient Advocate Encounter  Received notification from [employer] that prior authorization for Dexcom is required/requested.  Per Test Claim: Freestyle Libre preferred by the ins. Looks like that's what the pt has been using.   If suggested medication is appropriate, Please send in a new RX and discontinue this one. If not, please advise as to why it's not appropriate so that we may request a Prior Authorization.

## 2023-02-11 NOTE — Telephone Encounter (Signed)
What order are you referring to? I do not see anything.

## 2023-02-19 ENCOUNTER — Telehealth: Payer: Self-pay | Admitting: Nutrition

## 2023-02-19 NOTE — Telephone Encounter (Signed)
LVM to call me if she is interested in learning of all of the new advances in insulin pump therapy, or devices that deliver meal time insulin with the press of a button.

## 2023-02-26 MED ORDER — FREESTYLE LIBRE 3 SENSOR MISC
0 refills | Status: AC
Start: 2023-02-26 — End: ?

## 2023-02-26 NOTE — Telephone Encounter (Signed)
Requested Prescriptions   Signed Prescriptions Disp Refills   Continuous Glucose Sensor (FREESTYLE LIBRE 3 SENSOR) MISC 6 each 0    Sig: APPLY 1 SENSOR ONCE EVERY 14 DAYS    Authorizing Provider: Carlus Pavlov    Ordering User: Pollie Meyer

## 2023-02-26 NOTE — Addendum Note (Signed)
Addended by: Pollie Meyer on: 02/26/2023 09:54 AM   Modules accepted: Orders

## 2023-03-29 ENCOUNTER — Telehealth: Payer: BC Managed Care – PPO | Admitting: Family Medicine

## 2023-03-29 DIAGNOSIS — E1165 Type 2 diabetes mellitus with hyperglycemia: Secondary | ICD-10-CM | POA: Diagnosis not present

## 2023-03-29 DIAGNOSIS — B9689 Other specified bacterial agents as the cause of diseases classified elsewhere: Secondary | ICD-10-CM

## 2023-03-29 DIAGNOSIS — U071 COVID-19: Secondary | ICD-10-CM

## 2023-03-29 DIAGNOSIS — J208 Acute bronchitis due to other specified organisms: Secondary | ICD-10-CM | POA: Diagnosis not present

## 2023-03-29 MED ORDER — NIRMATRELVIR/RITONAVIR (PAXLOVID)TABLET: 3.0000 | ORAL_TABLET | Freq: Two times a day (BID) | ORAL | 0 refills | Status: AC

## 2023-03-29 MED ORDER — PREDNISONE 20 MG PO TABS: 20.0000 mg | ORAL_TABLET | Freq: Two times a day (BID) | ORAL | 0 refills | Status: AC

## 2023-03-29 MED ORDER — PROMETHAZINE-DM 6.25-15 MG/5ML PO SYRP: 5.0000 mL | ORAL_SOLUTION | Freq: Four times a day (QID) | ORAL | 0 refills | Status: AC | PRN

## 2023-03-29 NOTE — Progress Notes (Signed)
Virtual Visit Consent   [name], you are scheduled for a virtual visit with a [place] provider today. Just as with appointments in the office, your consent must be obtained to participate. Your consent will be active for this visit and any virtual visit you may have with one of our providers in the next 365 days. If you have a MyChart account, a copy of this consent can be sent to you electronically.  As this is a virtual visit, video technology does not allow for your provider to perform a traditional examination. This may limit your provider's ability to fully assess your condition. If your provider identifies any concerns that need to be evaluated in person or the need to arrange testing (such as labs, EKG, etc.), we will make arrangements to do so. Although advances in technology are sophisticated, we cannot ensure that it will always work on either your end or our end. If the connection with a video visit is poor, the visit may have to be switched to a telephone visit. With either a video or telephone visit, we are not always able to ensure that we have a secure connection.  By engaging in this virtual visit, you consent to the provision of healthcare and authorize for your insurance to be billed (if applicable) for the services provided during this visit. Depending on your insurance coverage, you may receive a charge related to this service.  I need to obtain your verbal consent now. Are you willing to proceed with your visit today? [name] has provided verbal consent on 03/29/2023 for a virtual visit (video or telephone). Georgana Curio, FNP  Date: 03/29/2023 4:03 PM  Virtual Visit via Video Note   I, Georgana Curio, connected with  Lindsey Mason  ([id number], 05/26/1971) on 03/29/23 at  4:00 PM EDT by a video-enabled telemedicine application and verified that I am speaking with the correct person using two identifiers.  Location: Patient: Virtual Visit Location Patient: Home Provider:  Virtual Visit Location Provider: Home Office   I discussed the limitations of evaluation and management by telemedicine and the availability of in person appointments. The patient expressed understanding and agreed to proceed.    History of Present Illness: Lindsey Mason is a 52 y.o. who identifies as a female who was assigned female at birth, and is being seen today for covid positive test today with sx starting Thursday. She reports cough, congestion, wheezing, in no distress with sx persistent. She is diabetic on an insulin pump. [name]  HPI: HPI  Problems:  Patient Active Problem List   Diagnosis Date Noted   Nausea and vomiting 12/24/2018   Nausea & vomiting 12/24/2018   Generalized abdominal pain 12/24/2018   Statin declined 05/05/2018   Bloating 03/10/2018   Alternating constipation and diarrhea 03/10/2018   Pyrosis 03/10/2018   Abnormal liver ultrasound 03/10/2018   Calculus of gallbladder without cholecystitis without obstruction 03/10/2018   h/o Enlarged ovaries 11/20/2017   Environmental and seasonal allergies 11/04/2017   Psychophysiological insomnia 11/04/2017   Vitamin D deficiency 08/26/2017   Depression 09/03/2016   Overweight (BMI 25.0-29.9) 09/03/2016   Witnessed episode of apnea 09/03/2016   Urinary frequency 04/03/2016   Acute maxillary sinusitis 11/04/2014   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   PCOS (polycystic ovarian syndrome) 05/27/2012   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   Major depressive disorder, recurrent episode (HCC) 08/20/2010   Hyperlipidemia associated with type 2 diabetes mellitus (HCC) 04/11/2009   ACNE ROSACEA 04/11/2009  Poorly controlled type 2 diabetes mellitus (HCC)- on insulin now for mgt 03/08/2009   GERD 03/08/2009    Allergies:  Allergies  Allergen Reactions   Lantus [Insulin Glargine] Other (See Comments)    headaches   Medications:  Current Outpatient Medications:    albuterol (VENTOLIN HFA) 108 (90 Base) MCG/ACT  inhaler, Inhale 2 puffs into the lungs every 6 (six) hours as needed for wheezing or shortness of breath., Disp: 8 g, Rfl: 0   azelastine (ASTELIN) 0.1 % nasal spray, Place 1 spray into both nostrils 2 (two) times daily. Use in each nostril as directed, Disp: 30 mL, Rfl: 0   benzonatate (TESSALON) 100 MG capsule, Take 1 capsule (100 mg total) by mouth 3 (three) times daily as needed., Disp: 30 capsule, Rfl: 0   blood glucose meter kit and supplies, Dispense based on patient and insurance preference. Use in the morning when fasting and 2 hours after largest meal. (FOR ICD-10 E10.9, E11.9)., Disp: 1 each, Rfl: 0   Continuous Glucose Sensor (FREESTYLE LIBRE 3 SENSOR) MISC, APPLY 1 SENSOR ONCE EVERY 14 DAYS, Disp: 6 each, Rfl: 0   fluticasone (FLONASE) 50 MCG/ACT nasal spray, 1 spray each nostril after sinus rinse twice daily (Patient taking differently: Place 1 spray into both nostrils daily.), Disp: 16 g, Rfl: 2   ibuprofen (ADVIL) 200 MG tablet, Take 400 mg by mouth every 6 (six) hours as needed for moderate pain., Disp: , Rfl:    insulin aspart (NOVOLOG) 100 UNIT/ML injection, Use up to 100 units a day in the insulin pump, Disp: 30 mL, Rfl: 5   insulin detemir (LEVEMIR FLEXPEN) 100 UNIT/ML FlexPen, INJECT 40 UNITS SUBCUTANEOUSLY IN THE MORNING AND IN THE EVENING, Disp: 45 mL, Rfl: 0   Insulin Pen Needle 32G X 4 MM MISC, Use 5x a day, Disp: 400 each, Rfl: 3   metFORMIN (GLUCOPHAGE-XR) 500 MG 24 hr tablet, Take 2 tablets (1,000 mg total) by mouth 2 (two) times daily with a meal., Disp: 360 tablet, Rfl: 1   NOVOLOG FLEXPEN 100 UNIT/ML FlexPen, Novolog 20-24 units before meals, Disp: 30 mL, Rfl: 0   ONETOUCH VERIO test strip, USE IN THE MORNING WHEN FASTING AND 2 HOURS AFTER LARGEST MEAL, Disp: 200 each, Rfl: 0   promethazine-dextromethorphan (PROMETHAZINE-DM) 6.25-15 MG/5ML syrup, Take 5 mLs by mouth 4 (four) times daily as needed., Disp: 118 mL, Rfl: 0   rosuvastatin (CRESTOR) 5 MG tablet, Take 1 tablet  (5 mg total) by mouth daily. (Patient not taking: Reported on 01/13/2023), Disp: 90 tablet, Rfl: 3   traMADol (ULTRAM) 50 MG tablet, Take 1 tablet (50 mg total) by mouth every 6 (six) hours as needed., Disp: 20 tablet, Rfl: 0  Observations/Objective: Patient is well-developed, well-nourished in no acute distress.  Resting comfortably  at home.  Head is normocephalic, atraumatic.  No labored breathing.  Speech is clear and coherent with logical content.  Patient is alert and oriented at baseline.    Assessment and Plan: 1. COVID  2. Poorly controlled type 2 diabetes mellitus (HCC)- on insulin now for mgt  Paxlovid given as requested, increase fluid, humidifier at night, tylenol, rest, quarantine discussed, Ed if sx persist or worsen.   Follow Up Instructions: I discussed the assessment and treatment plan with the patient. The patient was provided an opportunity to ask questions and all were answered. The patient agreed with the plan and demonstrated an understanding of the instructions.  A copy of instructions were sent to the patient via  MyChart unless otherwise noted below.     The patient was advised to call back or seek an in-person evaluation if the symptoms worsen or if the condition fails to improve as anticipated.  Time:  I spent 10 minutes with the patient via telehealth technology discussing the above problems/concerns.    Georgana Curio, FNP

## 2023-03-29 NOTE — Patient Instructions (Signed)

## 2023-04-15 ENCOUNTER — Ambulatory Visit: Payer: BC Managed Care – PPO | Admitting: Internal Medicine

## 2023-05-23 ENCOUNTER — Other Ambulatory Visit: Payer: Self-pay | Admitting: Physician Assistant

## 2023-05-23 ENCOUNTER — Ambulatory Visit
Admission: RE | Admit: 2023-05-23 | Discharge: 2023-05-23 | Disposition: A | Discharge status: Home / Self Care | Payer: BC Managed Care – PPO | Source: Ambulatory Visit | Attending: Physician Assistant | Admitting: Physician Assistant

## 2023-05-23 DIAGNOSIS — Z0000 Encounter for general adult medical examination without abnormal findings: Secondary | ICD-10-CM

## 2023-07-26 ENCOUNTER — Other Ambulatory Visit: Payer: Self-pay | Admitting: Internal Medicine

## 2023-07-29 ENCOUNTER — Other Ambulatory Visit (HOSPITAL_COMMUNITY): Payer: Self-pay | Admitting: Physician Assistant

## 2023-07-29 DIAGNOSIS — R0609 Other forms of dyspnea: Secondary | ICD-10-CM

## 2023-08-18 ENCOUNTER — Ambulatory Visit (HOSPITAL_COMMUNITY): Payer: 59 | Attending: Cardiology

## 2023-08-18 DIAGNOSIS — R0609 Other forms of dyspnea: Secondary | ICD-10-CM | POA: Diagnosis present

## 2023-08-18 LAB — ECHOCARDIOGRAM COMPLETE BUBBLE STUDY
Area-P 1/2: 4.57 cm2
S' Lateral: 3.21 cm

## 2023-08-25 ENCOUNTER — Telehealth: Payer: Self-pay | Admitting: Internal Medicine

## 2023-08-26 NOTE — Telephone Encounter (Signed)
 LMx1 to schedule follow up with Dr. Elvera Lennox.

## 2023-08-26 NOTE — Telephone Encounter (Signed)
 Per last ov note in July pt was supposed to be seen in October and she canceled that appt without rescheduling.    Return in about 3 months (around 04/15/2023).  Please get pt scheduled, then I will send in her refills.

## 2023-08-27 NOTE — Telephone Encounter (Signed)
 LVM to schedule follow up

## 2023-08-29 ENCOUNTER — Emergency Department (HOSPITAL_BASED_OUTPATIENT_CLINIC_OR_DEPARTMENT_OTHER): Payer: 59

## 2023-08-29 ENCOUNTER — Encounter (HOSPITAL_BASED_OUTPATIENT_CLINIC_OR_DEPARTMENT_OTHER): Payer: Self-pay | Admitting: Urology

## 2023-08-29 ENCOUNTER — Other Ambulatory Visit: Payer: Self-pay

## 2023-08-29 ENCOUNTER — Emergency Department (HOSPITAL_BASED_OUTPATIENT_CLINIC_OR_DEPARTMENT_OTHER)
Admission: EM | Admit: 2023-08-29 | Discharge: 2023-08-29 | Disposition: A | Discharge status: Home / Self Care | Payer: 59 | Attending: Emergency Medicine | Admitting: Emergency Medicine

## 2023-08-29 DIAGNOSIS — Z7984 Long term (current) use of oral hypoglycemic drugs: Secondary | ICD-10-CM | POA: Insufficient documentation

## 2023-08-29 DIAGNOSIS — Z87891 Personal history of nicotine dependence: Secondary | ICD-10-CM | POA: Diagnosis not present

## 2023-08-29 DIAGNOSIS — R079 Chest pain, unspecified: Secondary | ICD-10-CM | POA: Diagnosis present

## 2023-08-29 DIAGNOSIS — R0789 Other chest pain: Secondary | ICD-10-CM | POA: Insufficient documentation

## 2023-08-29 DIAGNOSIS — E119 Type 2 diabetes mellitus without complications: Secondary | ICD-10-CM | POA: Diagnosis not present

## 2023-08-29 DIAGNOSIS — Z794 Long term (current) use of insulin: Secondary | ICD-10-CM | POA: Insufficient documentation

## 2023-08-29 LAB — BASIC METABOLIC PANEL
Anion gap: 9 (ref 5–15)
BUN: 15 mg/dL (ref 6–20)
CO2: 26 mmol/L (ref 22–32)
Calcium: 9.1 mg/dL (ref 8.9–10.3)
Chloride: 104 mmol/L (ref 98–111)
Creatinine, Ser: 0.65 mg/dL (ref 0.44–1.00)
GFR, Estimated: >60 mL/min (ref >60)
Glucose, Bld: 120 mg/dL — ABNORMAL HIGH (ref 70–99)
Potassium: 4 mmol/L (ref 3.5–5.1)
Sodium: 139 mmol/L (ref 135–145)

## 2023-08-29 LAB — CBC
HCT: 42 % (ref 36.0–46.0)
Hemoglobin: 13.6 g/dL (ref 12.0–15.0)
MCH: 28.5 pg (ref 26.0–34.0)
MCHC: 32.4 g/dL (ref 30.0–36.0)
MCV: 87.9 fL (ref 80.0–100.0)
Platelets: 346 10*3/uL (ref 150–400)
RBC: 4.78 MIL/uL (ref 3.87–5.11)
RDW: 13.6 % (ref 11.5–15.5)
WBC: 13.4 10*3/uL — ABNORMAL HIGH (ref 4.0–10.5)
nRBC: 0 % (ref 0.0–0.2)

## 2023-08-29 LAB — TROPONIN I (HIGH SENSITIVITY)
Troponin I (High Sensitivity): <2 ng/L (ref <18)
Troponin I (High Sensitivity): <2 ng/L (ref <18)

## 2023-08-29 MED ORDER — ACETAMINOPHEN 325 MG PO TABS: 650.0000 mg | ORAL_TABLET | Freq: Four times a day (QID) | ORAL | 0 refills | Status: AC | PRN

## 2023-08-29 MED ORDER — CYCLOBENZAPRINE HCL 10 MG PO TABS: 10.0000 mg | ORAL_TABLET | Freq: Two times a day (BID) | ORAL | 0 refills | Status: AC | PRN

## 2023-08-29 MED ORDER — LIDOCAINE 5 % EX PTCH: 1.0000 | MEDICATED_PATCH | Freq: Every day | CUTANEOUS | 0 refills | Status: AC | PRN

## 2023-08-29 MED ORDER — KETOROLAC TROMETHAMINE 15 MG/ML IJ SOLN
15.0000 mg | Freq: Once | INTRAMUSCULAR | Status: AC
  Administered 2023-08-29: 15 mg via INTRAVENOUS
  Filled 2023-08-29: qty 1

## 2023-08-29 NOTE — ED Notes (Signed)
 Patient transported to X-ray

## 2023-08-29 NOTE — ED Notes (Addendum)
 [name]

## 2023-08-29 NOTE — ED Provider Notes (Signed)
  EMERGENCY DEPARTMENT AT [facility] Provider Note  CSN: [id number] Arrival date & time: 08/29/23 1753  Chief Complaint(s) Chest Pain  HPI Lindsey Mason is a 53 y.o. female with past medical history as below, significant for DM, PCOS, HLD, appendectomy who presents to the ED with complaint of chest pain.  Chest pain ongoing the last 24 hours.  She had similar episode of pain last week that was attributed to musculoskeletal source.  Pain worsened with torso moving or moving her extremities or deep inspiration.  Reproducible on palpation to mid sternum in her axilla.  No dyspnea.  No trauma.  No recent overexertion.  No dyspnea, rashes, nausea or vomiting.  No change in bowel or bladder function  Past Medical History Past Medical History:  Diagnosis Date   Clomid pregnancy 1997, 2005   Depression    Diabetes mellitus without mention of complication    Esophageal reflux    Headache(784.0)    Infertility, female    PCOS - Clomid pregnancies    Ovarian cyst 2019   PCOS (polycystic ovarian syndrome) 05/27/2012   PONV (postoperative nausea and vomiting)    nausea   Pure hypercholesterolemia    Rosacea    Status post appendectomy 11/19/2017   Patient Active Problem List   Diagnosis Date Noted   Nausea and vomiting 12/24/2018   Nausea & vomiting 12/24/2018   Generalized abdominal pain 12/24/2018   Statin declined 05/05/2018   Bloating 03/10/2018   Alternating constipation and diarrhea 03/10/2018   Pyrosis 03/10/2018   Abnormal liver ultrasound 03/10/2018   Calculus of gallbladder without cholecystitis without obstruction 03/10/2018   h/o Enlarged ovaries 11/20/2017   Environmental and seasonal allergies 11/04/2017   Psychophysiological insomnia 11/04/2017   Vitamin D deficiency 08/26/2017   Depression 09/03/2016   Overweight (BMI 25.0-29.9) 09/03/2016   Witnessed episode of apnea 09/03/2016   Urinary frequency 04/03/2016   Acute maxillary sinusitis 11/04/2014    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 Authorizing Provider  acetaminophen (TYLENOL) 325 MG tablet Take 2 tablets (650 mg total) by mouth every 6 (six) hours as needed. 08/29/23  Yes Tanda Rockers A, DO  cyclobenzaprine (FLEXERIL) 10 MG tablet Take 1 tablet (10 mg total) by mouth 2 (two) times daily as needed for muscle spasms. 08/29/23  Yes Tanda Rockers A, DO  lidocaine (LIDODERM) 5 % Place 1 patch onto the skin daily as needed. Remove & Discard patch within 12 hours or as directed by MD 08/29/23  Yes Tanda Rockers A, DO  albuterol (VENTOLIN HFA) 108 (90 Base) MCG/ACT inhaler Inhale 2 puffs into the lungs every 6 (six) hours as needed for wheezing or shortness of breath. 03/25/22   Margaretann Loveless, PA-C  azelastine (ASTELIN) 0.1 % nasal spray Place 1 spray into both nostrils 2 (two) times daily. Use in each nostril as directed 09/18/20   Waldon Merl, PA-C  benzonatate (TESSALON) 100 MG capsule Take 1 capsule (100 mg total) by mouth 3 (three) times daily as needed. 03/25/22   Margaretann Loveless, PA-C  blood  glucose meter kit and supplies Dispense based on patient and insurance preference. Use in the morning when fasting and 2 hours after largest meal. (FOR ICD-10 E10.9, E11.9). 12/31/18   Opalski, Gavin Pound, DO  Continuous Glucose Sensor (FREESTYLE LIBRE 3 SENSOR) MISC APPLY 1 SENSOR ONCE EVERY 14 DAYS 02/26/23   Carlus Pavlov, MD  fluticasone (FLONASE) 50 MCG/ACT nasal spray 1 spray each nostril after sinus rinse twice daily Patient  taking differently: Place 1 spray into both nostrils daily. 10/20/17   Opalski, Gavin Pound, DO  ibuprofen (ADVIL) 200 MG tablet Take 400 mg by mouth every 6 (six) hours as needed for moderate pain.    [provider]  insulin detemir (LEVEMIR FLEXPEN) 100 UNIT/ML FlexPen INJECT 40 UNITS SUBCUTANEOUSLY IN THE MORNING AND IN THE EVENING 12/02/22   Carlus Pavlov, MD  Insulin Pen Needle 32G X 4 MM MISC Use 5x a day 09/05/21   Carlus Pavlov, MD  metFORMIN (GLUCOPHAGE-XR) 500 MG 24 hr tablet Take 2 tablets (1,000 mg total) by mouth 2 (two) times daily with a meal. 01/13/23   Carlus Pavlov, MD  NOVOLOG 100 UNIT/ML injection INJECT UP TO 100 UNITS SUBCUTANEOUSLY ONCE DAILY VIA PUMP 08/28/23   Carlus Pavlov, MD  NOVOLOG FLEXPEN 100 UNIT/ML FlexPen Novolog 20-24 units before meals 02/02/23   Carlus Pavlov, MD  [facility] VERIO test strip USE IN THE MORNING WHEN FASTING AND 2 HOURS AFTER LARGEST MEAL 03/22/19   Opalski, Gavin Pound, DO  promethazine-dextromethorphan (PROMETHAZINE-DM) 6.25-15 MG/5ML syrup Take 5 mLs by mouth 4 (four) times daily as needed. 03/29/23   Delorse Lek, FNP  rosuvastatin (CRESTOR) 5 MG tablet Take 1 tablet (5 mg total) by mouth daily. Patient not taking: Reported on 01/13/2023 09/05/21   Carlus Pavlov, MD  traMADol (ULTRAM) 50 MG tablet Take 1 tablet (50 mg total) by mouth every 6 (six) hours as needed. 02/15/21   Cindee Salt, MD                                                                                                                                    Past Surgical History Past Surgical History:  Procedure Laterality Date   APPENDECTOMY     CESAREAN SECTION     x 3, last one with triplets   CHOLECYSTECTOMY N/A 12/25/2018   Procedure: LAPAROSCOPIC CHOLECYSTECTOMY WITH INTRAOPERATIVE CHOLANGIOGRAM;  Surgeon: Griselda Miner, MD;  Location: [facility];  Service: General;  Laterality: N/A;   FASCIECTOMY Right 02/15/2021   Procedure: FASCIECTOMY RIGHT MIDDLE, RING AND  SMALL FINGERS;  Surgeon: Cindee Salt, MD;  Location: [facility];  Service: Orthopedics;  Laterality: Right;  AXILLARY BLOCK   LAPAROSCOPIC BILATERAL SALPINGECTOMY Right 02/16/2018   Procedure: LAPAROSCOPIC RIGHT SALPINGECTOMY;  Surgeon: Jerene Bears, MD;  Location: [facility];  Service: Gynecology;  Laterality: Right;   TUBAL LIGATION     Family History Family History  Problem Relation Age of Onset  Cancer Mother        breast   Heart disease Mother    Stroke Mother    Diabetes Mother    Diabetes Father    Breast cancer Maternal Aunt    Colon cancer Neg Hx    Inflammatory bowel disease Neg Hx    Liver disease Neg Hx    Pancreatic cancer Neg Hx    Stomach cancer Neg Hx    Esophageal cancer Neg Hx     Social History Social History   Tobacco Use   Smoking status: Former    Current packs/day: 0.00    Average packs/day: 1 pack/day for 15.0 years (15.0 ttl pk-yrs)    Types: Cigarettes    Start date: 03/04/1989    Quit date: 03/04/2004    Years since quitting: 19.5   Smokeless tobacco: Never  Vaping Use   Vaping status: Never Used  Substance Use Topics   Alcohol use: Yes    Comment: occ   Drug use: No   Allergies Lantus [insulin glargine]  Review of Systems A thorough review of systems was obtained and all systems are negative except as noted in the HPI and PMH.   Physical Exam Vital Signs  I have reviewed the triage vital signs BP 128/69   Pulse 87   Temp 98.4 F (36.9 C) (Oral)   Resp 11   Ht 5\' 6"  (1.676 m)   Wt 85.4 kg   SpO2 95%   BMI 30.39 kg/m  Physical Exam Vitals and nursing note reviewed.  Constitutional:      General: She is not in acute distress.    Appearance: Normal appearance. She is well-developed.  HENT:     Head: Normocephalic and atraumatic.     Right Ear: External ear normal.     Left Ear: External ear normal.     Nose: Nose normal.     Mouth/Throat:     Mouth: Mucous membranes are moist.  Eyes:      General: No scleral icterus.       Right eye: No discharge.        Left eye: No discharge.  Cardiovascular:     Rate and Rhythm: Normal rate and regular rhythm.     Pulses: Normal pulses.     Heart sounds: Normal heart sounds.  Pulmonary:     Effort: Pulmonary effort is normal. No respiratory distress.     Breath sounds: Normal breath sounds. No stridor.  Chest:    Abdominal:     General: Abdomen is flat. There is no distension.     Palpations: Abdomen is soft.     Tenderness: There is no abdominal tenderness.  Musculoskeletal:       Arms:     Cervical back: No rigidity.     Right lower leg: No edema.     Left lower leg: No edema.  Skin:    General: Skin is warm and dry.     Capillary Refill: Capillary refill takes less than 2 seconds.  Neurological:     Mental Status: She is alert.  Psychiatric:        Mood and Affect: Mood normal.        Behavior: Behavior normal. Behavior is cooperative.     ED Results and Treatments Labs (all labs ordered are listed, but only abnormal results are displayed) Labs Reviewed  BASIC METABOLIC PANEL - Abnormal; Notable for the following components:      Result Value   Glucose, Bld  120 (*)    All other components within normal limits  CBC - Abnormal; Notable for the following components:   WBC 13.4 (*)    All other components within normal limits  PREGNANCY, URINE  TROPONIN I (HIGH SENSITIVITY)  TROPONIN I (HIGH SENSITIVITY)                                                                                                                          Radiology DG Chest 2 View Result Date: 08/29/2023 CLINICAL DATA:  Chest pain since this afternoon EXAM: CHEST - 2 VIEW COMPARISON:  Radiograph and CT 10/13/2020 FINDINGS: The heart size and mediastinal contours are within normal limits. Both lungs are clear. The visualized skeletal structures are unremarkable. IMPRESSION: No active cardiopulmonary disease. Electronically Signed   By: Minerva Fester M.D.   On: 08/29/2023 18:36    Pertinent labs & imaging results that were available during my care of the patient were reviewed by me and considered in my medical decision making (see MDM for details).  Medications Ordered in ED Medications  ketorolac (TORADOL) 15 MG/ML injection 15 mg (15 mg Intravenous Given 08/29/23 2203)                                                                                                                                     Procedures Procedures  (including critical care time)  Medical Decision Making / ED Course    Medical Decision Making:    Lindsey Mason is a 53 y.o. female with past medical history as below, significant for DM, PCOS, HLD, appendectomy who presents to the ED with complaint of chest pain. The complaint involves an extensive differential diagnosis and also carries with it a high risk of complications and morbidity.  Serious etiology was considered. Ddx includes but is not limited to: Differential includes all life-threatening causes for chest pain. This includes but is not exclusive to acute coronary syndrome, aortic dissection, pulmonary embolism, cardiac tamponade, community-acquired pneumonia, pericarditis, musculoskeletal chest wall pain, etc.   Complete initial physical exam performed, notably the patient was in no distress, HDS.    Reviewed and confirmed nursing documentation for past medical history, family history, social history.  Vital signs reviewed.       Brief summary: 53 year old female history as above here with chest pain.  Chest pain is reproducible on exam.  Worsen with torso movement  or extremity movement.  The patient's chest pain is not suggestive of pulmonary embolus, cardiac ischemia, aortic dissection, pericarditis, myocarditis, pulmonary embolism, pneumothorax, pneumonia, Zoster, or esophageal perforation, or other serious etiology.  Historically not abrupt in onset, tearing or ripping, pulses symmetric.  EKG nonspecific for ischemia/infarction. No dysrhythmias, brugada, WPW, prolonged QT noted.   Favor likely atypical chest pain, likely msk  Troponin negative x2. CXR reviewed. Labs without demonstration of acute pathology unless otherwise noted above. Low HEART Score: 0-3 points (0.9-1.7% risk of MACE).  Given the extremely low risk of these diagnoses further testing and evaluation for these possibilities does not appear to be indicated at this time. Patient in no distress and overall condition improved here in the ED. Detailed discussions were had with the patient regarding current findings, and need for close f/u with PCP or on call doctor. The patient has been instructed to return immediately if the symptoms worsen in any way for re-evaluation. Patient verbalized understanding and is in agreement with current care plan. All questions answered prior to discharge.                 Additional history obtained: -Additional history obtained from na -External records from outside source obtained and reviewed including: Chart review including previous notes, labs, imaging, consultation notes including  Primary care documentation, prior labs and imaging, home medications   Lab Tests: -I ordered, reviewed, and interpreted labs.   The pertinent results include:   Labs Reviewed  BASIC METABOLIC PANEL - Abnormal; Notable for the following components:      Result Value   Glucose, Bld 120 (*)    All other components within normal limits  CBC - Abnormal; Notable for the following components:   WBC 13.4 (*)    All other components within normal limits  PREGNANCY, URINE  TROPONIN I (HIGH SENSITIVITY)  TROPONIN I (HIGH SENSITIVITY)    Notable for labs stable, troponin negative x 2  EKG   EKG Interpretation Date/Time:  Friday August 29 2023 18:04:55 EST Ventricular Rate:  90 PR Interval:  129 QRS Duration:  87 QT Interval:  356 QTC Calculation: 436 R Axis:   -14  Text  Interpretation: Sinus rhythm Abnormal R-wave progression, early transition no stemi Interpretation limited secondary to artifact Confirmed by Tanda Rockers ([id number]) on 08/29/2023 9:05:30 PM         Imaging Studies ordered: I ordered imaging studies including cxr I independently visualized the following imaging with scope of interpretation limited to determining acute life threatening conditions related to emergency care; findings noted above I independently visualized and interpreted imaging. I agree with the radiologist interpretation   Medicines ordered and prescription drug management: Meds ordered this encounter  Medications   ketorolac (TORADOL) 15 MG/ML injection 15 mg   lidocaine (LIDODERM) 5 %    Sig: Place 1 patch onto the skin daily as needed. Remove & Discard patch within 12 hours or as directed by MD    Dispense:  15 patch    Refill:  0   cyclobenzaprine (FLEXERIL) 10 MG tablet    Sig: Take 1 tablet (10 mg total) by mouth 2 (two) times daily as needed for muscle spasms.    Dispense:  20 tablet    Refill:  0   acetaminophen (TYLENOL) 325 MG tablet    Sig: Take 2 tablets (650 mg total) by mouth every 6 (six) hours as needed.    Dispense:  36 tablet    Refill:  0    -  I have reviewed the patients home medicines and have made adjustments as needed   Consultations Obtained: na   Cardiac Monitoring: The patient was maintained on a cardiac monitor.  I personally viewed and interpreted the cardiac monitored which showed an underlying rhythm of: NSR Continuous pulse oximetry interpreted by myself, 98% on RA.    Social Determinants of Health:  Diagnosis or treatment significantly limited by social determinants of health: former smoker   Reevaluation: After the interventions noted above, I reevaluated the patient and found that they have improved  Co morbidities that complicate the patient evaluation  Past Medical History:  Diagnosis Date   Clomid pregnancy 1997, 2005    Depression    Diabetes mellitus without mention of complication    Esophageal reflux    Headache(784.0)    Infertility, female    PCOS - Clomid pregnancies    Ovarian cyst 2019   PCOS (polycystic ovarian syndrome) 05/27/2012   PONV (postoperative nausea and vomiting)    nausea   Pure hypercholesterolemia    Rosacea    Status post appendectomy 11/19/2017      Dispostion: Disposition decision including need for hospitalization was considered, and patient discharged from emergency department.    Final Clinical Impression(s) / ED Diagnoses Final diagnoses:  Atypical chest pain        Sloan Leiter, DO 08/30/23 0016

## 2023-08-29 NOTE — ED Triage Notes (Signed)
 Pt states left sided chest pain that started today  States did have some back pain last night  States pain does radiated down left arm as well  States pain with deep breathing   Had Echo 2 weeks ago that was normal

## 2023-08-29 NOTE — Discharge Instructions (Addendum)
Return to the Emergency Department if you have unusual chest pain, pressure, or discomfort, shortness of breath, nausea, vomiting, burping, heartburn, tingling upper body parts, sweating, cold, clammy skin, or racing heartbeat. Call 911 if you think you are having a heart attack. Take all medications as prescribed - notify your doctor if you have any side effects. Follow cardiac diet - avoid fatty & fried foods, don't eat too much red meat, eat lots of fruits & vegetables, and dairy products should be low fat. Please lose weight if you are overweight. Become more active with walking, gardening, or any other activity that gets you to moving.   Please return to the emergency department immediately for any new or concerning symptoms, or if you get worse.

## 2023-08-29 NOTE — Telephone Encounter (Signed)
 [id number] to call office to schedule follow up appointment with Dr. Elvera Lennox.

## 2023-11-16 ENCOUNTER — Other Ambulatory Visit: Payer: Self-pay | Admitting: Internal Medicine

## 2023-12-09 ENCOUNTER — Ambulatory Visit: Attending: Cardiology | Admitting: Cardiology

## 2023-12-09 DIAGNOSIS — R55 Syncope and collapse: Secondary | ICD-10-CM | POA: Insufficient documentation

## 2023-12-09 NOTE — Progress Notes (Deleted)
  Cardiology Office Note:  .   Date:  12/09/2023  ID:  Lindsey Mason, DOB 02-Sep-1970, MRN [id number] PCP: Alona Arrow, PA  [facility] Providers Cardiologist:  Fransico Ivy, MD PCP: Alona Arrow, PA  No chief complaint on file.    Lindsey Mason is a 53 y.o. female with hyperlipidemia, diabetes mellitus, chest pain  Discussed the use of AI scribe software for clinical note transcription with the patient, who gave verbal consent to proceed.  History of Present Illness       There were no vitals filed for this visit.    ROS      Studies Reviewed: .        *** Independently interpreted 08/2023: Chol ***, TG ***, HDL ***, LDL *** HbA1C ***% Hb 13.6 Cr 0.65 ***  Echocardiogram 08/2023: 1. Left ventricular ejection fraction, by estimation, is 60 to 65%. Left  ventricular ejection fraction by 3D volume is 61 %. The left ventricle has  normal function. The left ventricle has no regional wall motion  abnormalities. Left ventricular diastolic   parameters were normal.   2. Right ventricular systolic function is normal. The right ventricular  size is normal. There is normal pulmonary artery systolic pressure. The  estimated right ventricular systolic pressure is 24.5 mmHg.   3. The mitral valve is normal in structure. No evidence of mitral valve  regurgitation. No evidence of mitral stenosis.   4. The aortic valve is tricuspid. Aortic valve regurgitation is trivial.  No aortic stenosis is present.   5. The inferior vena cava is dilated in size with >50% respiratory  variability, suggesting right atrial pressure of 8 mmHg.   6. Negative bubble study, no PFO/ASD.    Risk Assessment/Calculations:   {Does this patient have ATRIAL FIBRILLATION?:[id number]}    Physical Exam   VISIT DIAGNOSES:   ICD-10-CM   1. Syncope, unspecified syncope type  R55        Lindsey Mason is a 53 y.o. female with *** Assessment and Plan Assessment &  Plan       {Are you ordering a CV Procedure (e.g. stress test, cath, DCCV, TEE, etc)?   Press F2        :[id number]}    No orders of the defined types were placed in this encounter.    F/u in ***  Signed, Cody Das, MD

## 2024-01-12 ENCOUNTER — Other Ambulatory Visit: Payer: Self-pay | Admitting: Internal Medicine

## 2024-01-12 DIAGNOSIS — E1165 Type 2 diabetes mellitus with hyperglycemia: Secondary | ICD-10-CM
